# Patient Record
Sex: Female | Born: 1940 | Race: Black or African American | Hispanic: No | Smoking: Former smoker
Health system: Southern US, Community
[De-identification: ages and names within clinical notes are randomized; demographics above are authoritative.]

## PROBLEM LIST (undated history)

## (undated) DIAGNOSIS — I10 Essential (primary) hypertension: Secondary | ICD-10-CM

## (undated) DIAGNOSIS — M329 Systemic lupus erythematosus, unspecified: Secondary | ICD-10-CM

## (undated) DIAGNOSIS — H409 Unspecified glaucoma: Secondary | ICD-10-CM

## (undated) DIAGNOSIS — E7800 Pure hypercholesterolemia, unspecified: Secondary | ICD-10-CM

## (undated) DIAGNOSIS — E079 Disorder of thyroid, unspecified: Secondary | ICD-10-CM

## (undated) DIAGNOSIS — IMO0002 Reserved for concepts with insufficient information to code with codable children: Secondary | ICD-10-CM

## (undated) HISTORY — DX: Reserved for concepts with insufficient information to code with codable children: IMO0002

## (undated) HISTORY — PX: TUBAL LIGATION: SHX77

## (undated) HISTORY — DX: Systemic lupus erythematosus, unspecified: M32.9

## (undated) HISTORY — DX: Unspecified glaucoma: H40.9

## (undated) HISTORY — PX: CATARACT EXTRACTION, BILATERAL: SHX1313

## (undated) HISTORY — PX: TOTAL KNEE ARTHROPLASTY: SHX125

## (undated) HISTORY — PX: JOINT REPLACEMENT: SHX530

## (undated) HISTORY — PX: ABDOMINAL HYSTERECTOMY: SHX81

---

## 1999-03-05 ENCOUNTER — Emergency Department (HOSPITAL_COMMUNITY): Admission: EM | Admit: 1999-03-05 | Discharge: 1999-03-05 | Payer: Self-pay | Admitting: Emergency Medicine

## 1999-10-19 ENCOUNTER — Encounter: Payer: Self-pay | Admitting: Family Medicine

## 1999-10-19 ENCOUNTER — Encounter: Admission: RE | Admit: 1999-10-19 | Discharge: 1999-10-19 | Payer: Self-pay | Admitting: Family Medicine

## 2000-01-11 ENCOUNTER — Other Ambulatory Visit: Admission: RE | Admit: 2000-01-11 | Discharge: 2000-01-11 | Payer: Self-pay | Admitting: Family Medicine

## 2000-03-27 ENCOUNTER — Encounter (INDEPENDENT_AMBULATORY_CARE_PROVIDER_SITE_OTHER): Payer: Self-pay | Admitting: *Deleted

## 2000-03-27 ENCOUNTER — Ambulatory Visit (HOSPITAL_COMMUNITY): Admission: RE | Admit: 2000-03-27 | Discharge: 2000-03-27 | Payer: Self-pay | Admitting: *Deleted

## 2000-05-10 ENCOUNTER — Emergency Department (HOSPITAL_COMMUNITY): Admission: EM | Admit: 2000-05-10 | Discharge: 2000-05-11 | Payer: Self-pay | Admitting: Emergency Medicine

## 2000-05-11 ENCOUNTER — Encounter: Payer: Self-pay | Admitting: Emergency Medicine

## 2000-10-23 ENCOUNTER — Other Ambulatory Visit: Admission: RE | Admit: 2000-10-23 | Discharge: 2000-10-23 | Payer: Self-pay | Admitting: *Deleted

## 2000-12-20 ENCOUNTER — Ambulatory Visit (HOSPITAL_BASED_OUTPATIENT_CLINIC_OR_DEPARTMENT_OTHER): Admission: RE | Admit: 2000-12-20 | Discharge: 2000-12-20 | Payer: Self-pay | Admitting: *Deleted

## 2002-05-07 ENCOUNTER — Other Ambulatory Visit: Admission: RE | Admit: 2002-05-07 | Discharge: 2002-05-07 | Payer: Self-pay | Admitting: *Deleted

## 2003-05-27 ENCOUNTER — Ambulatory Visit (HOSPITAL_COMMUNITY): Admission: RE | Admit: 2003-05-27 | Discharge: 2003-05-27 | Payer: Self-pay | Admitting: *Deleted

## 2003-05-27 ENCOUNTER — Encounter (INDEPENDENT_AMBULATORY_CARE_PROVIDER_SITE_OTHER): Payer: Self-pay | Admitting: *Deleted

## 2003-06-16 ENCOUNTER — Other Ambulatory Visit: Admission: RE | Admit: 2003-06-16 | Discharge: 2003-06-16 | Payer: Self-pay | Admitting: *Deleted

## 2004-07-13 ENCOUNTER — Other Ambulatory Visit: Admission: RE | Admit: 2004-07-13 | Discharge: 2004-07-13 | Payer: Self-pay | Admitting: *Deleted

## 2005-02-27 ENCOUNTER — Encounter: Admission: RE | Admit: 2005-02-27 | Discharge: 2005-02-27 | Payer: Self-pay | Admitting: Internal Medicine

## 2005-03-22 ENCOUNTER — Ambulatory Visit (HOSPITAL_COMMUNITY): Admission: RE | Admit: 2005-03-22 | Discharge: 2005-03-22 | Payer: Self-pay | Admitting: Gastroenterology

## 2005-07-21 ENCOUNTER — Encounter: Admission: RE | Admit: 2005-07-21 | Discharge: 2005-07-21 | Payer: Self-pay | Admitting: Orthopedic Surgery

## 2005-08-08 ENCOUNTER — Encounter: Admission: RE | Admit: 2005-08-08 | Discharge: 2005-08-08 | Payer: Self-pay | Admitting: Orthopedic Surgery

## 2005-08-28 ENCOUNTER — Other Ambulatory Visit: Admission: RE | Admit: 2005-08-28 | Discharge: 2005-08-28 | Payer: Self-pay | Admitting: *Deleted

## 2006-01-29 ENCOUNTER — Ambulatory Visit (HOSPITAL_COMMUNITY): Admission: RE | Admit: 2006-01-29 | Discharge: 2006-01-29 | Payer: Self-pay | Admitting: Otolaryngology

## 2007-09-17 ENCOUNTER — Ambulatory Visit (HOSPITAL_COMMUNITY): Admission: RE | Admit: 2007-09-17 | Discharge: 2007-09-17 | Payer: Self-pay | Admitting: Internal Medicine

## 2007-11-18 ENCOUNTER — Other Ambulatory Visit: Admission: RE | Admit: 2007-11-18 | Discharge: 2007-11-18 | Payer: Self-pay | Admitting: Gynecology

## 2008-11-27 ENCOUNTER — Emergency Department (HOSPITAL_COMMUNITY): Admission: EM | Admit: 2008-11-27 | Discharge: 2008-11-27 | Payer: Self-pay | Admitting: Emergency Medicine

## 2009-10-04 ENCOUNTER — Encounter
Admission: RE | Admit: 2009-10-04 | Discharge: 2009-10-04 | Payer: Self-pay | Admitting: Physical Medicine and Rehabilitation

## 2010-06-23 ENCOUNTER — Encounter
Admission: RE | Admit: 2010-06-23 | Discharge: 2010-06-23 | Payer: Self-pay | Source: Home / Self Care | Attending: Orthopedic Surgery | Admitting: Orthopedic Surgery

## 2010-10-04 ENCOUNTER — Other Ambulatory Visit: Payer: Self-pay | Admitting: Internal Medicine

## 2010-10-04 ENCOUNTER — Other Ambulatory Visit: Payer: Self-pay

## 2010-10-04 ENCOUNTER — Ambulatory Visit
Admission: RE | Admit: 2010-10-04 | Discharge: 2010-10-04 | Disposition: A | Discharge status: Home / Self Care | Payer: Medicare Other | Source: Ambulatory Visit | Attending: Internal Medicine | Admitting: Internal Medicine

## 2010-10-04 DIAGNOSIS — R0789 Other chest pain: Secondary | ICD-10-CM

## 2010-10-04 MED ORDER — IOHEXOL 300 MG/ML  SOLN
100.0000 mL | Freq: Once | INTRAMUSCULAR | Status: AC | PRN
  Administered 2010-10-04: 100 mL via INTRAVENOUS

## 2010-10-21 NOTE — Op Note (Signed)
[place]. [facility]  Patient:    JEZABELLA, SCHRIEVER                      MRN: [id number] Adm. Date:  [id number] Attending:  Maryanna Shape                           Operative Report  PREOPERATIVE DIAGNOSES: 1. Small rotator cuff tear. 2. Impingement. 3. Tendinosis. 4. Early arthritis.  POSTOPERATIVE DIAGNOSES: 1. Grade 3 and 4 chondromalacia, humeral head and glenoid labrum. 2. Diffuse synovitis. 4. Multiple chondral loose bodies. 4. A 1 x 1 cm rotator cuff tear.  ANESTHESIA:  General.  SURGEON:  Reynolds Bowl, M.D.  ASSISTANT:  _____.  DESCRIPTION OF PROCEDURE:  Established anterior and posterior portals in the right shoulder after appropriate prep and general anesthetic and immediately saw there were multiple chondral fragments floating about the joint with a was a very ragged humeral head and grade 3 changes of the glenoid labrum and diffuse synovial proliferation.  Through the anterior operative portal, a rotary meniscotome was placed, and this was used to debride some of the inflamed synovium and remove the loose bodies.  We did as well as we could.  We think that we got them all but could not be certain, then using the Mitek Side Effect Bovie to curette the synovium.  The biceps tendon was intact.  There was some inflammatory change in the area of the subscapularis.  This was likewise bovied.  We then looked in the subchondral space and established a lateral portal.  We saw there had been previous acromioplasty.  There were some adhesions more medially, and these were cleared.  There was a good space.  There was a 1 x 1 cm rotator cuff tear that was best seen from this surface, and we used the Mitek Side Effect ablator to debride the edges of this tear.  Otherwise did not seem that the space was compromised.  Having done that, we spent some time in the glenohumeral joint bovieing any venous bleeders.  Instruments were then removed, and  the alpha pump was placed in the subacromial space.  The portals were approximated with 5-0 nylon.  The Alpha pump tube was held in place with a piece of Tegaderm and a bulky dressing applied.  Instructions to the patient will be to take Darvocet-N 100 for pain, begin using the hand and arm gradually as tolerated, remove Alpha pump at 36 hours, at four days may shower, clean puncture wounds before and after with alcohol.  Apply cold to shoulder.  Have usual diet.  Call me with concerns. DD:  12/20/00 TD:  12/20/00 Job: [id number]

## 2010-10-21 NOTE — Op Note (Signed)
NAMEJAE, BRUCK NO.:  [id number]   MEDICAL RECORD NO.:  [id number]          PATIENT TYPE:  AMB   LOCATION:  ENDO                         FACILITY:  [facility]   PHYSICIAN:  Danise Edge, M.D.   DATE OF BIRTH:  1940-12-26   DATE OF PROCEDURE:  03/22/2005  DATE OF DISCHARGE:                                 OPERATIVE REPORT   PROCEDURE:  Diagnostic esophagogastroduodenoscopy.   REFERRING PHYSICIAN:  Ladell Pier, M.D.   PROCEDURE INDICATIONS:  Ms. Barbara Patton is a 70 year old female born 12-07-1940. Barbara Patton has a globus type sensation in her throat which does not  interfere with her swallowing. She denies cough, hoarseness, dysphagia or  odynophagia. She has no throat clearing.   The patient underwent a barium tablet and liquid barium swallow which was  normal except for the presence of a small sliding hiatal hernia transient  delay of the passage of the barium tablet at the aortic arch.   ENDOSCOPIST:  Danise Edge, M.D.]   PREMEDICATION:  Versed 5 mg, Demerol 50 mg.   DESCRIPTION OF PROCEDURE:  After obtaining informed consent, Barbara Patton was  placed in the left lateral decubitus position. I administered intravenous  Demerol and intravenous Versed to achieve conscious sedation for the  procedure. The patient's blood pressure, oxygen saturation and cardiac  rhythm were monitored throughout the procedure and documented in the medical  record.   The Olympus gastroscope was passed through the posterior hypopharynx into  the proximal esophagus without difficulty. The hypopharynx, larynx and vocal  cords appeared completely normal. There was no evidence of a foreign body in  the hypopharynx.   ESOPHAGOSCOPY:  The proximal, mid and lower segments of the esophageal  mucosa appear completely normal. The squamocolumnar junction is noted at 39  cm from the incisor teeth. There is no endoscopic evidence for the presence  of Barrett's esophagus, erosive  esophagitis or esophageal stricture  formation.   GASTROSCOPY:  Retroflexed view of the gastric cardia and fundus was normal.  The gastric body, antrum and pylorus appeared normal.   DUODENOSCOPY:  The duodenal bulb and descending duodenum appeared normal.   ASSESSMENT:  Normal esophagogastroduodenoscopy. I cannot explain Barbara Patton's  foreign body sensation in her throat.           ______________________________  Danise Edge, M.D.     MJ/MEDQ  D:  03/22/2005  T:  03/22/2005  Job:  [id number]   cc:   Ladell Pier, M.D.  Fax: [phone]

## 2010-12-05 ENCOUNTER — Other Ambulatory Visit: Payer: Self-pay | Admitting: Orthopedic Surgery

## 2010-12-05 DIAGNOSIS — M25512 Pain in left shoulder: Secondary | ICD-10-CM

## 2010-12-05 DIAGNOSIS — R29898 Other symptoms and signs involving the musculoskeletal system: Secondary | ICD-10-CM

## 2010-12-08 ENCOUNTER — Ambulatory Visit
Admission: RE | Admit: 2010-12-08 | Discharge: 2010-12-08 | Disposition: A | Discharge status: Home / Self Care | Payer: Medicare Other | Source: Ambulatory Visit | Attending: Orthopedic Surgery | Admitting: Orthopedic Surgery

## 2010-12-08 DIAGNOSIS — R29898 Other symptoms and signs involving the musculoskeletal system: Secondary | ICD-10-CM

## 2010-12-08 DIAGNOSIS — M25512 Pain in left shoulder: Secondary | ICD-10-CM

## 2010-12-15 ENCOUNTER — Ambulatory Visit (HOSPITAL_COMMUNITY)
Admission: RE | Admit: 2010-12-15 | Discharge: 2010-12-15 | Disposition: A | Discharge status: Home / Self Care | Payer: Medicare Other | Source: Ambulatory Visit | Attending: Orthopedic Surgery | Admitting: Orthopedic Surgery

## 2010-12-15 ENCOUNTER — Encounter (HOSPITAL_COMMUNITY)
Admission: RE | Admit: 2010-12-15 | Discharge: 2010-12-15 | Disposition: A | Discharge status: Home / Self Care | Payer: Medicare Other | Source: Ambulatory Visit | Attending: Orthopedic Surgery | Admitting: Orthopedic Surgery

## 2010-12-15 ENCOUNTER — Other Ambulatory Visit (HOSPITAL_COMMUNITY): Payer: Self-pay | Admitting: Orthopedic Surgery

## 2010-12-15 DIAGNOSIS — M75100 Unspecified rotator cuff tear or rupture of unspecified shoulder, not specified as traumatic: Secondary | ICD-10-CM

## 2010-12-15 DIAGNOSIS — Z01811 Encounter for preprocedural respiratory examination: Secondary | ICD-10-CM | POA: Insufficient documentation

## 2010-12-15 DIAGNOSIS — I517 Cardiomegaly: Secondary | ICD-10-CM | POA: Insufficient documentation

## 2010-12-15 DIAGNOSIS — Z01812 Encounter for preprocedural laboratory examination: Secondary | ICD-10-CM | POA: Insufficient documentation

## 2010-12-15 LAB — CBC
Hemoglobin: 12.2 g/dL (ref 12.0–15.0)
MCHC: 33.5 g/dL (ref 30.0–36.0)
Platelets: 212 10*3/uL (ref 150–400)
RBC: 3.9 MIL/uL (ref 3.87–5.11)

## 2010-12-15 LAB — BASIC METABOLIC PANEL
BUN: 24 mg/dL — ABNORMAL HIGH (ref 6–23)
GFR calc Af Amer: >60 mL/min (ref >60)
Glucose, Bld: 87 mg/dL (ref 70–99)
Potassium: 3.6 mEq/L (ref 3.5–5.1)
Sodium: 140 mEq/L (ref 135–145)

## 2010-12-20 ENCOUNTER — Ambulatory Visit (HOSPITAL_COMMUNITY)
Admission: RE | Admit: 2010-12-20 | Discharge: 2010-12-22 | Disposition: A | Discharge status: Home / Self Care | Payer: Medicare Other | Source: Ambulatory Visit | Attending: Orthopedic Surgery | Admitting: Orthopedic Surgery

## 2010-12-20 DIAGNOSIS — M659 Unspecified synovitis and tenosynovitis, unspecified site: Secondary | ICD-10-CM | POA: Insufficient documentation

## 2010-12-20 DIAGNOSIS — I10 Essential (primary) hypertension: Secondary | ICD-10-CM | POA: Insufficient documentation

## 2010-12-20 DIAGNOSIS — Z5333 Arthroscopic surgical procedure converted to open procedure: Secondary | ICD-10-CM | POA: Insufficient documentation

## 2010-12-20 DIAGNOSIS — M66329 Spontaneous rupture of flexor tendons, unspecified upper arm: Secondary | ICD-10-CM | POA: Insufficient documentation

## 2010-12-20 DIAGNOSIS — M129 Arthropathy, unspecified: Secondary | ICD-10-CM | POA: Insufficient documentation

## 2010-12-20 DIAGNOSIS — M719 Bursopathy, unspecified: Secondary | ICD-10-CM | POA: Insufficient documentation

## 2010-12-20 DIAGNOSIS — M67919 Unspecified disorder of synovium and tendon, unspecified shoulder: Secondary | ICD-10-CM | POA: Insufficient documentation

## 2010-12-21 LAB — URINALYSIS, ROUTINE W REFLEX MICROSCOPIC
Bilirubin Urine: NEGATIVE
Glucose, UA: NEGATIVE mg/dL
Ketones, ur: NEGATIVE mg/dL
Leukocytes, UA: NEGATIVE
Nitrite: NEGATIVE
Protein, ur: NEGATIVE mg/dL
Urobilinogen, UA: 0.2 mg/dL (ref 0.0–1.0)

## 2010-12-23 LAB — URINE CULTURE

## 2011-01-05 NOTE — Op Note (Signed)
Barbara Patton, Barbara Patton NO.:  [id number]  MEDICAL RECORD NO.:  [id number]  LOCATION:  5041                         FACILITY:  [facility]  PHYSICIAN:  Burnard Bunting, M.D.    DATE OF BIRTH:  1940-08-09  DATE OF PROCEDURE:  12/20/2010 DATE OF DISCHARGE:                              OPERATIVE REPORT   PREOPERATIVE DIAGNOSES:  Left shoulder synovitis, biceps tendonitis, rotator cuff tear.  POSTOPERATIVE DIAGNOSES:  Left shoulder synovitis, biceps tendonitis, rotator cuff tear.  PROCEDURE:  Left shoulder diagnostic arthroscopy with extensive debridement of labrum including calcified superior labral tissue, biceps tendon release, rotator cuff and synovitis debridement, open biceps tenodesis, open partial rotator cuff repair.  SURGEON:  Burnard Bunting, MD  ASSISTANT:  Wende Neighbors, PA  ANESTHESIA:  General endotracheal.  ESTIMATED BLOOD LOSS:  25 mL.  DRAINS:  None.  INDICATIONS:  Barbara Patton is a 70 year old patient with longstanding left shoulder rotator cuff tear.  She presents now for operative management after explanation of risks and benefits.  OPERATIVE FINDINGS: 1. Examination under anesthesia, range of motion, external rotation to     50 degrees, abduction is 60, isolated glenohumeral forward flexion     is about 160, isolated glenohumeral abduction is about 90.  This is     on forward flexion and glenohumeral abduction is about 10-15     degrees less than the right hand side. 2. Diagnostic operative arthroscopy.     a.     Flattening and degeneration of the intra-articular portion      of the biceps with a full-thickness horizontal cleavage type tear      in the biceps tendon.     b.     Synovitis within the rotator interval.     c.     Synovitis and calcification of the superior labral tissue.     d.     Massive rotator cuff tear with retraction of the      supraspinatus to the glenoid rim and retraction of the      infraspinatus  posteriorly. 3. Bursitis and spurring  PROCEDURE IN DETAIL:  The patient was brought to the operating room where general endotracheal anesthesia was induced.  Perioperative antibiotics were administered.  Left shoulder, arm, and hand were prescribed with alcohol and Betadine, which allowed to air dry then prepped with DuraPrep solution and draped in sterile manner.  Barbara Patton was used cover the axilla.  The time-out was called.  Posterior portal was created 2 cm medial and inferior to the posterolateral margin of the acromion.  Anterior portal was created under direct visualization. Diagnostic arthroscopy was performed.  Massive rotator cuff tear was identified.  Supraspinatus was unrepairable.  The infraspinatus had retracted posteriorly, but some partial humeral head coverage to restore the anterior-posterior force couple could be achieved.  Synovitis was debrided.  Calcification of the superior glenoid labral tissue was resected with a bur.  Debridement was also performed on the glenohumeral articular surface, particularly the humeral head where a loose chondral flaps were present, an area about the size of a dime of arthritis was present.  Anterior-inferior and posterior-inferior glenohumeral ligaments were intact.  Following extensive  debridement of synovitis, superior labral spurring, rotator cuff tendon and humeral head loose chondral flaps, the instruments were removed from the anterior and posterior portals, which were then closed using 3-0 nylon.  Barbara Patton was then used cover the shoulder after re-prepping with DuraPrep.  The incision was then made off the anterolateral margin acromion.  Skin and subcutaneous tissue were sharply divided.  Deltoid was split and measured distance of 4 cm from the anterolateral margin of the acromion between the middle and anterior raphes.  Stay suture of 0 Vicryl suture was placed.  Biceps tenodesis was then performed incising the transverse humeral  ligament on the medial aspect.  Tendon was tenodesed after securing with fiber loop with 5 throws and securing it into the intertubercular groove with 7 mm x 30 mm bioabsorbable interference screw from [employer].  The rotator cuff tendon was then mobilized. Humeral head coverage was not possible due to retraction, however, anterior-posterior force couple could be achieved with a partial repair of the infraspinatus, which was mobilized anteriorly.  This was secured using two corkscrew anchors and two PushLock.  At this time, the subacromial decompression with bursectomy and acromioplasty was performed.  Great care was taken to maintain the coracoacromial ligament attachment.  At this time, the incision was thoroughly irrigated. Instruments were removed.  The deltoid split was closed using 0 Vicryl suture followed by interrupted inverted 2-0 Vicryl suture and running 3- 0 pullout Prolene.  The patient tolerated the procedure well without immediate complication.  Barbara Patton's assistance was required during the case for retraction opening, closing, limb positioning, mobilization of tissue.  Her assistance was a medical necessity.     Burnard Bunting, M.D.     GSD/MEDQ  D:  12/20/2010  T:  12/20/2010  Job:  [id number]  Electronically Signed by [name].  Barbara Patton M.D. on 01/05/2011 08:23:15 AM

## 2011-02-28 LAB — CROSSMATCH
ABO/RH(D): O POS
Antibody Screen: NEGATIVE

## 2011-02-28 LAB — ABO/RH: ABO/RH(D): O POS

## 2011-03-09 ENCOUNTER — Other Ambulatory Visit (HOSPITAL_COMMUNITY): Payer: Self-pay | Admitting: Orthopedic Surgery

## 2011-03-09 DIAGNOSIS — M25562 Pain in left knee: Secondary | ICD-10-CM

## 2011-03-15 ENCOUNTER — Ambulatory Visit (HOSPITAL_COMMUNITY): Payer: Medicare Other

## 2011-03-15 ENCOUNTER — Encounter (HOSPITAL_COMMUNITY)
Admission: RE | Admit: 2011-03-15 | Discharge: 2011-03-15 | Disposition: A | Discharge status: Home / Self Care | Payer: Medicare Other | Source: Ambulatory Visit | Attending: Orthopedic Surgery | Admitting: Orthopedic Surgery

## 2011-03-15 DIAGNOSIS — M25569 Pain in unspecified knee: Secondary | ICD-10-CM | POA: Insufficient documentation

## 2011-03-15 DIAGNOSIS — M25562 Pain in left knee: Secondary | ICD-10-CM

## 2011-03-15 MED ORDER — TECHNETIUM TC 99M MEDRONATE IV KIT
25.0000 | PACK | Freq: Once | INTRAVENOUS | Status: AC | PRN
  Administered 2011-03-15: 27 via INTRAVENOUS

## 2011-03-16 DIAGNOSIS — T8484XA Pain due to internal orthopedic prosthetic devices, implants and grafts, initial encounter: Secondary | ICD-10-CM | POA: Insufficient documentation

## 2011-03-16 DIAGNOSIS — Z96659 Presence of unspecified artificial knee joint: Secondary | ICD-10-CM | POA: Insufficient documentation

## 2011-03-20 ENCOUNTER — Encounter (HOSPITAL_COMMUNITY): Payer: Medicare Other

## 2011-03-20 ENCOUNTER — Other Ambulatory Visit (HOSPITAL_COMMUNITY): Payer: Medicare Other

## 2011-06-20 DIAGNOSIS — M19049 Primary osteoarthritis, unspecified hand: Secondary | ICD-10-CM | POA: Diagnosis not present

## 2011-07-05 DIAGNOSIS — Z0000 Encounter for general adult medical examination without abnormal findings: Secondary | ICD-10-CM | POA: Diagnosis not present

## 2011-07-05 DIAGNOSIS — J069 Acute upper respiratory infection, unspecified: Secondary | ICD-10-CM | POA: Diagnosis not present

## 2011-07-05 DIAGNOSIS — E782 Mixed hyperlipidemia: Secondary | ICD-10-CM | POA: Diagnosis not present

## 2011-07-05 DIAGNOSIS — E039 Hypothyroidism, unspecified: Secondary | ICD-10-CM | POA: Diagnosis not present

## 2011-07-05 DIAGNOSIS — I10 Essential (primary) hypertension: Secondary | ICD-10-CM | POA: Diagnosis not present

## 2011-07-18 DIAGNOSIS — M19049 Primary osteoarthritis, unspecified hand: Secondary | ICD-10-CM | POA: Diagnosis not present

## 2011-07-18 DIAGNOSIS — G5600 Carpal tunnel syndrome, unspecified upper limb: Secondary | ICD-10-CM | POA: Diagnosis not present

## 2011-07-26 DIAGNOSIS — E039 Hypothyroidism, unspecified: Secondary | ICD-10-CM | POA: Diagnosis not present

## 2011-07-26 DIAGNOSIS — N951 Menopausal and female climacteric states: Secondary | ICD-10-CM | POA: Diagnosis not present

## 2011-07-26 DIAGNOSIS — B373 Candidiasis of vulva and vagina: Secondary | ICD-10-CM | POA: Diagnosis not present

## 2011-07-26 DIAGNOSIS — Z124 Encounter for screening for malignant neoplasm of cervix: Secondary | ICD-10-CM | POA: Diagnosis not present

## 2011-07-26 DIAGNOSIS — Z1212 Encounter for screening for malignant neoplasm of rectum: Secondary | ICD-10-CM | POA: Diagnosis not present

## 2011-07-27 DIAGNOSIS — M79609 Pain in unspecified limb: Secondary | ICD-10-CM | POA: Diagnosis not present

## 2011-07-27 DIAGNOSIS — M25579 Pain in unspecified ankle and joints of unspecified foot: Secondary | ICD-10-CM | POA: Diagnosis not present

## 2011-08-02 DIAGNOSIS — Z09 Encounter for follow-up examination after completed treatment for conditions other than malignant neoplasm: Secondary | ICD-10-CM | POA: Diagnosis not present

## 2011-08-02 DIAGNOSIS — N63 Unspecified lump in unspecified breast: Secondary | ICD-10-CM | POA: Diagnosis not present

## 2011-08-03 DIAGNOSIS — M25579 Pain in unspecified ankle and joints of unspecified foot: Secondary | ICD-10-CM | POA: Diagnosis not present

## 2011-08-04 DIAGNOSIS — M25579 Pain in unspecified ankle and joints of unspecified foot: Secondary | ICD-10-CM | POA: Diagnosis not present

## 2011-08-08 DIAGNOSIS — M25579 Pain in unspecified ankle and joints of unspecified foot: Secondary | ICD-10-CM | POA: Diagnosis not present

## 2011-08-09 DIAGNOSIS — M25579 Pain in unspecified ankle and joints of unspecified foot: Secondary | ICD-10-CM | POA: Diagnosis not present

## 2011-08-11 DIAGNOSIS — M25579 Pain in unspecified ankle and joints of unspecified foot: Secondary | ICD-10-CM | POA: Diagnosis not present

## 2011-08-15 DIAGNOSIS — M25579 Pain in unspecified ankle and joints of unspecified foot: Secondary | ICD-10-CM | POA: Diagnosis not present

## 2011-08-30 DIAGNOSIS — M25519 Pain in unspecified shoulder: Secondary | ICD-10-CM | POA: Diagnosis not present

## 2011-09-05 DIAGNOSIS — Z96659 Presence of unspecified artificial knee joint: Secondary | ICD-10-CM | POA: Diagnosis not present

## 2011-09-05 DIAGNOSIS — Z9660 Presence of unspecified orthopedic joint implant: Secondary | ICD-10-CM | POA: Diagnosis not present

## 2011-09-05 DIAGNOSIS — T84039A Mechanical loosening of unspecified internal prosthetic joint, initial encounter: Secondary | ICD-10-CM | POA: Diagnosis not present

## 2011-09-05 DIAGNOSIS — M25469 Effusion, unspecified knee: Secondary | ICD-10-CM | POA: Diagnosis not present

## 2011-09-05 DIAGNOSIS — Z471 Aftercare following joint replacement surgery: Secondary | ICD-10-CM | POA: Diagnosis not present

## 2011-09-21 DIAGNOSIS — M6740 Ganglion, unspecified site: Secondary | ICD-10-CM | POA: Diagnosis not present

## 2011-09-29 DIAGNOSIS — H2510 Age-related nuclear cataract, unspecified eye: Secondary | ICD-10-CM | POA: Diagnosis not present

## 2011-09-29 DIAGNOSIS — H4011X0 Primary open-angle glaucoma, stage unspecified: Secondary | ICD-10-CM | POA: Diagnosis not present

## 2011-09-30 DIAGNOSIS — J00 Acute nasopharyngitis [common cold]: Secondary | ICD-10-CM | POA: Diagnosis not present

## 2011-09-30 DIAGNOSIS — J309 Allergic rhinitis, unspecified: Secondary | ICD-10-CM | POA: Diagnosis not present

## 2011-10-03 DIAGNOSIS — J329 Chronic sinusitis, unspecified: Secondary | ICD-10-CM | POA: Diagnosis not present

## 2011-10-04 DIAGNOSIS — T8489XA Other specified complication of internal orthopedic prosthetic devices, implants and grafts, initial encounter: Secondary | ICD-10-CM | POA: Diagnosis not present

## 2011-10-04 DIAGNOSIS — Z96659 Presence of unspecified artificial knee joint: Secondary | ICD-10-CM | POA: Diagnosis not present

## 2011-11-02 DIAGNOSIS — R791 Abnormal coagulation profile: Secondary | ICD-10-CM | POA: Diagnosis not present

## 2011-11-02 DIAGNOSIS — T8489XA Other specified complication of internal orthopedic prosthetic devices, implants and grafts, initial encounter: Secondary | ICD-10-CM | POA: Diagnosis not present

## 2011-11-02 DIAGNOSIS — E039 Hypothyroidism, unspecified: Secondary | ICD-10-CM | POA: Insufficient documentation

## 2011-11-02 DIAGNOSIS — IMO0002 Reserved for concepts with insufficient information to code with codable children: Secondary | ICD-10-CM | POA: Diagnosis not present

## 2011-11-02 DIAGNOSIS — Z880 Allergy status to penicillin: Secondary | ICD-10-CM | POA: Diagnosis not present

## 2011-11-02 DIAGNOSIS — E669 Obesity, unspecified: Secondary | ICD-10-CM | POA: Diagnosis present

## 2011-11-02 DIAGNOSIS — M169 Osteoarthritis of hip, unspecified: Secondary | ICD-10-CM | POA: Diagnosis not present

## 2011-11-02 DIAGNOSIS — I10 Essential (primary) hypertension: Secondary | ICD-10-CM | POA: Diagnosis not present

## 2011-11-02 DIAGNOSIS — E785 Hyperlipidemia, unspecified: Secondary | ICD-10-CM | POA: Insufficient documentation

## 2011-11-02 DIAGNOSIS — Z01818 Encounter for other preprocedural examination: Secondary | ICD-10-CM | POA: Diagnosis not present

## 2011-11-02 DIAGNOSIS — T84039A Mechanical loosening of unspecified internal prosthetic joint, initial encounter: Secondary | ICD-10-CM | POA: Diagnosis not present

## 2011-11-02 DIAGNOSIS — Z87891 Personal history of nicotine dependence: Secondary | ICD-10-CM | POA: Diagnosis not present

## 2011-11-02 DIAGNOSIS — Z01811 Encounter for preprocedural respiratory examination: Secondary | ICD-10-CM | POA: Diagnosis not present

## 2011-11-02 DIAGNOSIS — M1990 Unspecified osteoarthritis, unspecified site: Secondary | ICD-10-CM | POA: Insufficient documentation

## 2011-11-02 DIAGNOSIS — Z96659 Presence of unspecified artificial knee joint: Secondary | ICD-10-CM | POA: Diagnosis not present

## 2011-11-02 DIAGNOSIS — M6281 Muscle weakness (generalized): Secondary | ICD-10-CM | POA: Diagnosis not present

## 2011-11-02 DIAGNOSIS — Z01810 Encounter for preprocedural cardiovascular examination: Secondary | ICD-10-CM | POA: Diagnosis not present

## 2011-11-02 DIAGNOSIS — Z6831 Body mass index (BMI) 31.0-31.9, adult: Secondary | ICD-10-CM | POA: Diagnosis not present

## 2011-11-02 DIAGNOSIS — Z471 Aftercare following joint replacement surgery: Secondary | ICD-10-CM | POA: Diagnosis not present

## 2011-11-02 DIAGNOSIS — Z90710 Acquired absence of both cervix and uterus: Secondary | ICD-10-CM | POA: Diagnosis not present

## 2011-11-02 DIAGNOSIS — H409 Unspecified glaucoma: Secondary | ICD-10-CM | POA: Diagnosis present

## 2011-11-02 DIAGNOSIS — D62 Acute posthemorrhagic anemia: Secondary | ICD-10-CM | POA: Diagnosis not present

## 2011-11-02 DIAGNOSIS — G8918 Other acute postprocedural pain: Secondary | ICD-10-CM | POA: Diagnosis not present

## 2011-11-02 DIAGNOSIS — Z885 Allergy status to narcotic agent status: Secondary | ICD-10-CM | POA: Diagnosis not present

## 2011-11-02 DIAGNOSIS — Z79899 Other long term (current) drug therapy: Secondary | ICD-10-CM | POA: Diagnosis not present

## 2011-11-23 DIAGNOSIS — Z4789 Encounter for other orthopedic aftercare: Secondary | ICD-10-CM | POA: Diagnosis not present

## 2011-11-23 DIAGNOSIS — E039 Hypothyroidism, unspecified: Secondary | ICD-10-CM | POA: Diagnosis not present

## 2011-11-23 DIAGNOSIS — I10 Essential (primary) hypertension: Secondary | ICD-10-CM | POA: Diagnosis not present

## 2011-11-23 DIAGNOSIS — M7989 Other specified soft tissue disorders: Secondary | ICD-10-CM | POA: Diagnosis not present

## 2011-11-23 DIAGNOSIS — Z96659 Presence of unspecified artificial knee joint: Secondary | ICD-10-CM | POA: Diagnosis not present

## 2011-11-23 DIAGNOSIS — Z471 Aftercare following joint replacement surgery: Secondary | ICD-10-CM | POA: Diagnosis not present

## 2011-11-23 DIAGNOSIS — E785 Hyperlipidemia, unspecified: Secondary | ICD-10-CM | POA: Diagnosis not present

## 2011-11-23 DIAGNOSIS — M6281 Muscle weakness (generalized): Secondary | ICD-10-CM | POA: Diagnosis not present

## 2011-12-06 DIAGNOSIS — M7989 Other specified soft tissue disorders: Secondary | ICD-10-CM | POA: Insufficient documentation

## 2011-12-14 DIAGNOSIS — M542 Cervicalgia: Secondary | ICD-10-CM | POA: Diagnosis not present

## 2011-12-14 DIAGNOSIS — H9209 Otalgia, unspecified ear: Secondary | ICD-10-CM | POA: Diagnosis not present

## 2011-12-14 DIAGNOSIS — R51 Headache: Secondary | ICD-10-CM | POA: Diagnosis not present

## 2011-12-14 DIAGNOSIS — M1710 Unilateral primary osteoarthritis, unspecified knee: Secondary | ICD-10-CM | POA: Diagnosis not present

## 2011-12-19 DIAGNOSIS — M1710 Unilateral primary osteoarthritis, unspecified knee: Secondary | ICD-10-CM | POA: Diagnosis not present

## 2011-12-21 DIAGNOSIS — M1710 Unilateral primary osteoarthritis, unspecified knee: Secondary | ICD-10-CM | POA: Diagnosis not present

## 2011-12-26 DIAGNOSIS — M1710 Unilateral primary osteoarthritis, unspecified knee: Secondary | ICD-10-CM | POA: Diagnosis not present

## 2011-12-27 DIAGNOSIS — E782 Mixed hyperlipidemia: Secondary | ICD-10-CM | POA: Diagnosis not present

## 2011-12-27 DIAGNOSIS — E039 Hypothyroidism, unspecified: Secondary | ICD-10-CM | POA: Diagnosis not present

## 2011-12-27 DIAGNOSIS — I10 Essential (primary) hypertension: Secondary | ICD-10-CM | POA: Diagnosis not present

## 2011-12-28 DIAGNOSIS — M1710 Unilateral primary osteoarthritis, unspecified knee: Secondary | ICD-10-CM | POA: Diagnosis not present

## 2012-01-02 DIAGNOSIS — M1710 Unilateral primary osteoarthritis, unspecified knee: Secondary | ICD-10-CM | POA: Diagnosis not present

## 2012-01-04 DIAGNOSIS — M1710 Unilateral primary osteoarthritis, unspecified knee: Secondary | ICD-10-CM | POA: Diagnosis not present

## 2012-01-09 DIAGNOSIS — M1710 Unilateral primary osteoarthritis, unspecified knee: Secondary | ICD-10-CM | POA: Diagnosis not present

## 2012-01-11 DIAGNOSIS — M1710 Unilateral primary osteoarthritis, unspecified knee: Secondary | ICD-10-CM | POA: Diagnosis not present

## 2012-01-16 DIAGNOSIS — M1710 Unilateral primary osteoarthritis, unspecified knee: Secondary | ICD-10-CM | POA: Diagnosis not present

## 2012-01-19 DIAGNOSIS — M1710 Unilateral primary osteoarthritis, unspecified knee: Secondary | ICD-10-CM | POA: Diagnosis not present

## 2012-01-23 DIAGNOSIS — M1710 Unilateral primary osteoarthritis, unspecified knee: Secondary | ICD-10-CM | POA: Diagnosis not present

## 2012-01-26 DIAGNOSIS — M1710 Unilateral primary osteoarthritis, unspecified knee: Secondary | ICD-10-CM | POA: Diagnosis not present

## 2012-01-29 DIAGNOSIS — H4011X0 Primary open-angle glaucoma, stage unspecified: Secondary | ICD-10-CM | POA: Diagnosis not present

## 2012-01-29 DIAGNOSIS — H2510 Age-related nuclear cataract, unspecified eye: Secondary | ICD-10-CM | POA: Diagnosis not present

## 2012-01-29 DIAGNOSIS — H409 Unspecified glaucoma: Secondary | ICD-10-CM | POA: Diagnosis not present

## 2012-01-30 DIAGNOSIS — M1710 Unilateral primary osteoarthritis, unspecified knee: Secondary | ICD-10-CM | POA: Diagnosis not present

## 2012-01-31 DIAGNOSIS — Z09 Encounter for follow-up examination after completed treatment for conditions other than malignant neoplasm: Secondary | ICD-10-CM | POA: Diagnosis not present

## 2012-01-31 DIAGNOSIS — R928 Other abnormal and inconclusive findings on diagnostic imaging of breast: Secondary | ICD-10-CM | POA: Diagnosis not present

## 2012-02-01 DIAGNOSIS — M1710 Unilateral primary osteoarthritis, unspecified knee: Secondary | ICD-10-CM | POA: Diagnosis not present

## 2012-02-07 DIAGNOSIS — M1710 Unilateral primary osteoarthritis, unspecified knee: Secondary | ICD-10-CM | POA: Diagnosis not present

## 2012-02-16 DIAGNOSIS — M1710 Unilateral primary osteoarthritis, unspecified knee: Secondary | ICD-10-CM | POA: Diagnosis not present

## 2012-03-14 DIAGNOSIS — M719 Bursopathy, unspecified: Secondary | ICD-10-CM | POA: Diagnosis not present

## 2012-03-14 DIAGNOSIS — M67919 Unspecified disorder of synovium and tendon, unspecified shoulder: Secondary | ICD-10-CM | POA: Diagnosis not present

## 2012-04-05 DIAGNOSIS — M545 Low back pain: Secondary | ICD-10-CM | POA: Diagnosis not present

## 2012-04-16 DIAGNOSIS — M545 Low back pain: Secondary | ICD-10-CM | POA: Diagnosis not present

## 2012-04-25 DIAGNOSIS — M25569 Pain in unspecified knee: Secondary | ICD-10-CM | POA: Diagnosis not present

## 2012-04-25 DIAGNOSIS — M25519 Pain in unspecified shoulder: Secondary | ICD-10-CM | POA: Diagnosis not present

## 2012-04-25 DIAGNOSIS — M719 Bursopathy, unspecified: Secondary | ICD-10-CM | POA: Diagnosis not present

## 2012-05-10 DIAGNOSIS — M545 Low back pain: Secondary | ICD-10-CM | POA: Diagnosis not present

## 2012-05-21 DIAGNOSIS — M1710 Unilateral primary osteoarthritis, unspecified knee: Secondary | ICD-10-CM | POA: Diagnosis not present

## 2012-05-31 DIAGNOSIS — M545 Low back pain: Secondary | ICD-10-CM | POA: Diagnosis not present

## 2012-05-31 DIAGNOSIS — M76899 Other specified enthesopathies of unspecified lower limb, excluding foot: Secondary | ICD-10-CM | POA: Diagnosis not present

## 2012-06-02 DIAGNOSIS — Z23 Encounter for immunization: Secondary | ICD-10-CM | POA: Diagnosis not present

## 2012-06-13 DIAGNOSIS — M1710 Unilateral primary osteoarthritis, unspecified knee: Secondary | ICD-10-CM | POA: Diagnosis not present

## 2012-06-13 DIAGNOSIS — M25579 Pain in unspecified ankle and joints of unspecified foot: Secondary | ICD-10-CM | POA: Diagnosis not present

## 2012-06-20 DIAGNOSIS — M25579 Pain in unspecified ankle and joints of unspecified foot: Secondary | ICD-10-CM | POA: Diagnosis not present

## 2012-07-01 DIAGNOSIS — M25579 Pain in unspecified ankle and joints of unspecified foot: Secondary | ICD-10-CM | POA: Diagnosis not present

## 2012-07-08 DIAGNOSIS — M25579 Pain in unspecified ankle and joints of unspecified foot: Secondary | ICD-10-CM | POA: Diagnosis not present

## 2012-07-09 DIAGNOSIS — Z0000 Encounter for general adult medical examination without abnormal findings: Secondary | ICD-10-CM | POA: Diagnosis not present

## 2012-07-09 DIAGNOSIS — E039 Hypothyroidism, unspecified: Secondary | ICD-10-CM | POA: Diagnosis not present

## 2012-07-09 DIAGNOSIS — I10 Essential (primary) hypertension: Secondary | ICD-10-CM | POA: Diagnosis not present

## 2012-07-09 DIAGNOSIS — E782 Mixed hyperlipidemia: Secondary | ICD-10-CM | POA: Diagnosis not present

## 2012-07-09 DIAGNOSIS — R32 Unspecified urinary incontinence: Secondary | ICD-10-CM | POA: Diagnosis not present

## 2012-07-10 DIAGNOSIS — M25579 Pain in unspecified ankle and joints of unspecified foot: Secondary | ICD-10-CM | POA: Diagnosis not present

## 2012-07-12 DIAGNOSIS — M25579 Pain in unspecified ankle and joints of unspecified foot: Secondary | ICD-10-CM | POA: Diagnosis not present

## 2012-07-16 DIAGNOSIS — M25579 Pain in unspecified ankle and joints of unspecified foot: Secondary | ICD-10-CM | POA: Diagnosis not present

## 2012-07-17 DIAGNOSIS — M25579 Pain in unspecified ankle and joints of unspecified foot: Secondary | ICD-10-CM | POA: Diagnosis not present

## 2012-07-22 DIAGNOSIS — R197 Diarrhea, unspecified: Secondary | ICD-10-CM | POA: Diagnosis not present

## 2012-07-30 DIAGNOSIS — H4011X0 Primary open-angle glaucoma, stage unspecified: Secondary | ICD-10-CM | POA: Diagnosis not present

## 2012-10-08 DIAGNOSIS — Z471 Aftercare following joint replacement surgery: Secondary | ICD-10-CM | POA: Diagnosis not present

## 2012-10-08 DIAGNOSIS — M25569 Pain in unspecified knee: Secondary | ICD-10-CM | POA: Diagnosis not present

## 2012-10-08 DIAGNOSIS — Z96659 Presence of unspecified artificial knee joint: Secondary | ICD-10-CM | POA: Diagnosis not present

## 2012-10-14 DIAGNOSIS — M1710 Unilateral primary osteoarthritis, unspecified knee: Secondary | ICD-10-CM | POA: Diagnosis not present

## 2012-10-30 DIAGNOSIS — M545 Low back pain: Secondary | ICD-10-CM | POA: Diagnosis not present

## 2012-11-08 DIAGNOSIS — M545 Low back pain: Secondary | ICD-10-CM | POA: Diagnosis not present

## 2012-11-11 DIAGNOSIS — M19079 Primary osteoarthritis, unspecified ankle and foot: Secondary | ICD-10-CM | POA: Diagnosis not present

## 2012-11-22 DIAGNOSIS — M19079 Primary osteoarthritis, unspecified ankle and foot: Secondary | ICD-10-CM | POA: Diagnosis not present

## 2012-11-26 DIAGNOSIS — M545 Low back pain: Secondary | ICD-10-CM | POA: Diagnosis not present

## 2012-11-27 DIAGNOSIS — H4011X0 Primary open-angle glaucoma, stage unspecified: Secondary | ICD-10-CM | POA: Diagnosis not present

## 2012-12-19 DIAGNOSIS — M25519 Pain in unspecified shoulder: Secondary | ICD-10-CM | POA: Diagnosis not present

## 2012-12-27 DIAGNOSIS — M19079 Primary osteoarthritis, unspecified ankle and foot: Secondary | ICD-10-CM | POA: Diagnosis not present

## 2013-01-02 DIAGNOSIS — M79609 Pain in unspecified limb: Secondary | ICD-10-CM | POA: Diagnosis not present

## 2013-01-07 DIAGNOSIS — H9209 Otalgia, unspecified ear: Secondary | ICD-10-CM | POA: Diagnosis not present

## 2013-01-07 DIAGNOSIS — E782 Mixed hyperlipidemia: Secondary | ICD-10-CM | POA: Diagnosis not present

## 2013-01-07 DIAGNOSIS — E039 Hypothyroidism, unspecified: Secondary | ICD-10-CM | POA: Diagnosis not present

## 2013-01-07 DIAGNOSIS — I10 Essential (primary) hypertension: Secondary | ICD-10-CM | POA: Diagnosis not present

## 2013-01-10 DIAGNOSIS — H4011X0 Primary open-angle glaucoma, stage unspecified: Secondary | ICD-10-CM | POA: Diagnosis not present

## 2013-01-10 DIAGNOSIS — H409 Unspecified glaucoma: Secondary | ICD-10-CM | POA: Diagnosis not present

## 2013-01-23 DIAGNOSIS — H2589 Other age-related cataract: Secondary | ICD-10-CM | POA: Diagnosis not present

## 2013-01-23 DIAGNOSIS — H2510 Age-related nuclear cataract, unspecified eye: Secondary | ICD-10-CM | POA: Diagnosis not present

## 2013-01-23 DIAGNOSIS — H25019 Cortical age-related cataract, unspecified eye: Secondary | ICD-10-CM | POA: Diagnosis not present

## 2013-02-05 DIAGNOSIS — Z1231 Encounter for screening mammogram for malignant neoplasm of breast: Secondary | ICD-10-CM | POA: Diagnosis not present

## 2013-02-06 DIAGNOSIS — R928 Other abnormal and inconclusive findings on diagnostic imaging of breast: Secondary | ICD-10-CM | POA: Diagnosis not present

## 2013-02-06 DIAGNOSIS — N63 Unspecified lump in unspecified breast: Secondary | ICD-10-CM | POA: Diagnosis not present

## 2013-02-13 DIAGNOSIS — H409 Unspecified glaucoma: Secondary | ICD-10-CM | POA: Diagnosis not present

## 2013-02-13 DIAGNOSIS — H4011X0 Primary open-angle glaucoma, stage unspecified: Secondary | ICD-10-CM | POA: Diagnosis not present

## 2013-03-14 DIAGNOSIS — Z23 Encounter for immunization: Secondary | ICD-10-CM | POA: Diagnosis not present

## 2013-04-04 DIAGNOSIS — R5381 Other malaise: Secondary | ICD-10-CM | POA: Diagnosis not present

## 2013-04-04 DIAGNOSIS — E039 Hypothyroidism, unspecified: Secondary | ICD-10-CM | POA: Diagnosis not present

## 2013-05-13 DIAGNOSIS — M25539 Pain in unspecified wrist: Secondary | ICD-10-CM | POA: Diagnosis not present

## 2013-05-23 DIAGNOSIS — H35359 Cystoid macular degeneration, unspecified eye: Secondary | ICD-10-CM | POA: Diagnosis not present

## 2013-05-27 DIAGNOSIS — H35359 Cystoid macular degeneration, unspecified eye: Secondary | ICD-10-CM | POA: Diagnosis not present

## 2013-06-10 DIAGNOSIS — M19049 Primary osteoarthritis, unspecified hand: Secondary | ICD-10-CM | POA: Diagnosis not present

## 2013-06-17 DIAGNOSIS — H35359 Cystoid macular degeneration, unspecified eye: Secondary | ICD-10-CM | POA: Diagnosis not present

## 2013-07-08 DIAGNOSIS — H35359 Cystoid macular degeneration, unspecified eye: Secondary | ICD-10-CM | POA: Diagnosis not present

## 2013-07-10 DIAGNOSIS — M19049 Primary osteoarthritis, unspecified hand: Secondary | ICD-10-CM | POA: Diagnosis not present

## 2013-07-15 DIAGNOSIS — H35359 Cystoid macular degeneration, unspecified eye: Secondary | ICD-10-CM | POA: Diagnosis not present

## 2013-07-16 DIAGNOSIS — I10 Essential (primary) hypertension: Secondary | ICD-10-CM | POA: Diagnosis not present

## 2013-07-16 DIAGNOSIS — E782 Mixed hyperlipidemia: Secondary | ICD-10-CM | POA: Diagnosis not present

## 2013-07-16 DIAGNOSIS — E039 Hypothyroidism, unspecified: Secondary | ICD-10-CM | POA: Diagnosis not present

## 2013-07-16 DIAGNOSIS — Z0000 Encounter for general adult medical examination without abnormal findings: Secondary | ICD-10-CM | POA: Diagnosis not present

## 2013-08-05 DIAGNOSIS — H35359 Cystoid macular degeneration, unspecified eye: Secondary | ICD-10-CM | POA: Diagnosis not present

## 2013-08-05 DIAGNOSIS — H35379 Puckering of macula, unspecified eye: Secondary | ICD-10-CM | POA: Diagnosis not present

## 2013-08-06 DIAGNOSIS — T889XXS Complication of surgical and medical care, unspecified, sequela: Secondary | ICD-10-CM | POA: Diagnosis not present

## 2013-08-06 DIAGNOSIS — Z471 Aftercare following joint replacement surgery: Secondary | ICD-10-CM | POA: Diagnosis not present

## 2013-08-06 DIAGNOSIS — M76899 Other specified enthesopathies of unspecified lower limb, excluding foot: Secondary | ICD-10-CM | POA: Diagnosis not present

## 2013-08-06 DIAGNOSIS — Z96659 Presence of unspecified artificial knee joint: Secondary | ICD-10-CM | POA: Diagnosis not present

## 2013-08-07 DIAGNOSIS — M7062 Trochanteric bursitis, left hip: Secondary | ICD-10-CM | POA: Insufficient documentation

## 2013-08-07 DIAGNOSIS — Z471 Aftercare following joint replacement surgery: Secondary | ICD-10-CM | POA: Diagnosis not present

## 2013-08-07 DIAGNOSIS — Z96659 Presence of unspecified artificial knee joint: Secondary | ICD-10-CM | POA: Diagnosis not present

## 2013-08-07 DIAGNOSIS — M76899 Other specified enthesopathies of unspecified lower limb, excluding foot: Secondary | ICD-10-CM | POA: Diagnosis not present

## 2013-08-12 DIAGNOSIS — Z86010 Personal history of colonic polyps: Secondary | ICD-10-CM | POA: Diagnosis not present

## 2013-08-12 DIAGNOSIS — Z09 Encounter for follow-up examination after completed treatment for conditions other than malignant neoplasm: Secondary | ICD-10-CM | POA: Diagnosis not present

## 2013-08-12 DIAGNOSIS — K648 Other hemorrhoids: Secondary | ICD-10-CM | POA: Diagnosis not present

## 2013-08-12 DIAGNOSIS — D128 Benign neoplasm of rectum: Secondary | ICD-10-CM | POA: Diagnosis not present

## 2013-08-14 DIAGNOSIS — H35359 Cystoid macular degeneration, unspecified eye: Secondary | ICD-10-CM | POA: Diagnosis not present

## 2013-09-04 DIAGNOSIS — H35379 Puckering of macula, unspecified eye: Secondary | ICD-10-CM | POA: Diagnosis not present

## 2013-09-04 DIAGNOSIS — H35359 Cystoid macular degeneration, unspecified eye: Secondary | ICD-10-CM | POA: Diagnosis not present

## 2013-09-23 DIAGNOSIS — H35359 Cystoid macular degeneration, unspecified eye: Secondary | ICD-10-CM | POA: Diagnosis not present

## 2013-10-01 DIAGNOSIS — Z96659 Presence of unspecified artificial knee joint: Secondary | ICD-10-CM | POA: Diagnosis not present

## 2013-10-01 DIAGNOSIS — Z471 Aftercare following joint replacement surgery: Secondary | ICD-10-CM | POA: Diagnosis not present

## 2013-10-16 DIAGNOSIS — M25519 Pain in unspecified shoulder: Secondary | ICD-10-CM | POA: Diagnosis not present

## 2013-10-16 DIAGNOSIS — M75120 Complete rotator cuff tear or rupture of unspecified shoulder, not specified as traumatic: Secondary | ICD-10-CM | POA: Diagnosis not present

## 2013-10-28 DIAGNOSIS — H3530 Unspecified macular degeneration: Secondary | ICD-10-CM | POA: Diagnosis not present

## 2013-11-04 DIAGNOSIS — H209 Unspecified iridocyclitis: Secondary | ICD-10-CM | POA: Diagnosis not present

## 2013-11-04 DIAGNOSIS — H35359 Cystoid macular degeneration, unspecified eye: Secondary | ICD-10-CM | POA: Diagnosis not present

## 2013-11-11 ENCOUNTER — Ambulatory Visit
Admission: RE | Admit: 2013-11-11 | Discharge: 2013-11-11 | Disposition: A | Discharge status: Home / Self Care | Payer: Medicare Other | Source: Ambulatory Visit | Attending: Physical Medicine and Rehabilitation | Admitting: Physical Medicine and Rehabilitation

## 2013-11-11 ENCOUNTER — Other Ambulatory Visit: Payer: Self-pay | Admitting: Physical Medicine and Rehabilitation

## 2013-11-11 DIAGNOSIS — M545 Low back pain, unspecified: Secondary | ICD-10-CM

## 2013-11-11 DIAGNOSIS — M48061 Spinal stenosis, lumbar region without neurogenic claudication: Secondary | ICD-10-CM

## 2013-11-11 DIAGNOSIS — G894 Chronic pain syndrome: Secondary | ICD-10-CM

## 2013-11-11 DIAGNOSIS — J069 Acute upper respiratory infection, unspecified: Secondary | ICD-10-CM | POA: Diagnosis not present

## 2013-11-11 DIAGNOSIS — M47817 Spondylosis without myelopathy or radiculopathy, lumbosacral region: Secondary | ICD-10-CM | POA: Diagnosis not present

## 2013-11-12 DIAGNOSIS — M545 Low back pain, unspecified: Secondary | ICD-10-CM | POA: Diagnosis not present

## 2013-11-12 DIAGNOSIS — M5137 Other intervertebral disc degeneration, lumbosacral region: Secondary | ICD-10-CM | POA: Diagnosis not present

## 2013-11-12 DIAGNOSIS — M79609 Pain in unspecified limb: Secondary | ICD-10-CM | POA: Diagnosis not present

## 2013-11-12 DIAGNOSIS — M48061 Spinal stenosis, lumbar region without neurogenic claudication: Secondary | ICD-10-CM | POA: Diagnosis not present

## 2013-11-12 DIAGNOSIS — G894 Chronic pain syndrome: Secondary | ICD-10-CM | POA: Diagnosis not present

## 2013-11-18 DIAGNOSIS — H35359 Cystoid macular degeneration, unspecified eye: Secondary | ICD-10-CM | POA: Diagnosis not present

## 2013-12-03 DIAGNOSIS — M25469 Effusion, unspecified knee: Secondary | ICD-10-CM | POA: Diagnosis not present

## 2013-12-03 DIAGNOSIS — IMO0002 Reserved for concepts with insufficient information to code with codable children: Secondary | ICD-10-CM | POA: Diagnosis not present

## 2013-12-03 DIAGNOSIS — T889XXS Complication of surgical and medical care, unspecified, sequela: Secondary | ICD-10-CM | POA: Diagnosis not present

## 2013-12-03 DIAGNOSIS — Z96659 Presence of unspecified artificial knee joint: Secondary | ICD-10-CM | POA: Diagnosis not present

## 2013-12-03 DIAGNOSIS — Z471 Aftercare following joint replacement surgery: Secondary | ICD-10-CM | POA: Diagnosis not present

## 2013-12-04 DIAGNOSIS — M7052 Other bursitis of knee, left knee: Secondary | ICD-10-CM | POA: Insufficient documentation

## 2014-01-14 DIAGNOSIS — I10 Essential (primary) hypertension: Secondary | ICD-10-CM | POA: Diagnosis not present

## 2014-01-14 DIAGNOSIS — L989 Disorder of the skin and subcutaneous tissue, unspecified: Secondary | ICD-10-CM | POA: Diagnosis not present

## 2014-01-14 DIAGNOSIS — E039 Hypothyroidism, unspecified: Secondary | ICD-10-CM | POA: Diagnosis not present

## 2014-01-14 DIAGNOSIS — E782 Mixed hyperlipidemia: Secondary | ICD-10-CM | POA: Diagnosis not present

## 2014-01-15 DIAGNOSIS — H35379 Puckering of macula, unspecified eye: Secondary | ICD-10-CM | POA: Diagnosis not present

## 2014-01-15 DIAGNOSIS — H35359 Cystoid macular degeneration, unspecified eye: Secondary | ICD-10-CM | POA: Diagnosis not present

## 2014-01-26 DIAGNOSIS — H20019 Primary iridocyclitis, unspecified eye: Secondary | ICD-10-CM | POA: Diagnosis not present

## 2014-01-27 ENCOUNTER — Emergency Department (HOSPITAL_COMMUNITY): Payer: Medicare Other

## 2014-01-27 ENCOUNTER — Emergency Department (HOSPITAL_COMMUNITY)
Admission: EM | Admit: 2014-01-27 | Discharge: 2014-01-27 | Disposition: A | Discharge status: Home / Self Care | Payer: Medicare Other | Attending: Emergency Medicine | Admitting: Emergency Medicine

## 2014-01-27 ENCOUNTER — Encounter (HOSPITAL_COMMUNITY): Payer: Self-pay | Admitting: Emergency Medicine

## 2014-01-27 DIAGNOSIS — R072 Precordial pain: Secondary | ICD-10-CM | POA: Diagnosis not present

## 2014-01-27 DIAGNOSIS — Z8639 Personal history of other endocrine, nutritional and metabolic disease: Secondary | ICD-10-CM | POA: Insufficient documentation

## 2014-01-27 DIAGNOSIS — R079 Chest pain, unspecified: Secondary | ICD-10-CM | POA: Diagnosis not present

## 2014-01-27 DIAGNOSIS — R946 Abnormal results of thyroid function studies: Secondary | ICD-10-CM | POA: Diagnosis not present

## 2014-01-27 DIAGNOSIS — Z880 Allergy status to penicillin: Secondary | ICD-10-CM | POA: Insufficient documentation

## 2014-01-27 DIAGNOSIS — H209 Unspecified iridocyclitis: Secondary | ICD-10-CM | POA: Diagnosis not present

## 2014-01-27 DIAGNOSIS — R109 Unspecified abdominal pain: Secondary | ICD-10-CM | POA: Insufficient documentation

## 2014-01-27 DIAGNOSIS — Z862 Personal history of diseases of the blood and blood-forming organs and certain disorders involving the immune mechanism: Secondary | ICD-10-CM | POA: Insufficient documentation

## 2014-01-27 DIAGNOSIS — I10 Essential (primary) hypertension: Secondary | ICD-10-CM | POA: Diagnosis not present

## 2014-01-27 DIAGNOSIS — H35359 Cystoid macular degeneration, unspecified eye: Secondary | ICD-10-CM | POA: Diagnosis not present

## 2014-01-27 HISTORY — DX: Pure hypercholesterolemia, unspecified: E78.00

## 2014-01-27 HISTORY — DX: Essential (primary) hypertension: I10

## 2014-01-27 HISTORY — DX: Disorder of thyroid, unspecified: E07.9

## 2014-01-27 LAB — CBC
HCT: 37.7 % (ref 36.0–46.0)
Hemoglobin: 12.2 g/dL (ref 12.0–15.0)
MCH: 31.5 pg (ref 26.0–34.0)
MCHC: 32.4 g/dL (ref 30.0–36.0)
MCV: 97.4 fL (ref 78.0–100.0)
PLATELETS: 197 10*3/uL (ref 150–400)
RBC: 3.87 MIL/uL (ref 3.87–5.11)
RDW: 13.6 % (ref 11.5–15.5)
WBC: 4 10*3/uL (ref 4.0–10.5)

## 2014-01-27 LAB — COMPREHENSIVE METABOLIC PANEL
ALBUMIN: 3.5 g/dL (ref 3.5–5.2)
ALK PHOS: 95 U/L (ref 39–117)
ALT: 37 U/L — ABNORMAL HIGH (ref 0–35)
ANION GAP: 9 (ref 5–15)
AST: 28 U/L (ref 0–37)
BUN: 15 mg/dL (ref 6–23)
CALCIUM: 9.2 mg/dL (ref 8.4–10.5)
CO2: 29 mEq/L (ref 19–32)
CREATININE: 0.73 mg/dL (ref 0.50–1.10)
Chloride: 102 mEq/L (ref 96–112)
GFR calc Af Amer: >90 mL/min (ref >90)
GFR calc non Af Amer: 82 mL/min — ABNORMAL LOW (ref >90)
GLUCOSE: 109 mg/dL — AB (ref 70–99)
Potassium: 3.8 mEq/L (ref 3.7–5.3)
Sodium: 140 mEq/L (ref 137–147)
TOTAL PROTEIN: 7.3 g/dL (ref 6.0–8.3)
Total Bilirubin: 0.5 mg/dL (ref 0.3–1.2)

## 2014-01-27 LAB — LIPASE, BLOOD: Lipase: 19 U/L (ref 11–59)

## 2014-01-27 LAB — I-STAT TROPONIN, ED
TROPONIN I, POC: 0 ng/mL (ref 0.00–0.08)
TROPONIN I, POC: 0.01 ng/mL (ref 0.00–0.08)

## 2014-01-27 NOTE — ED Notes (Signed)
Pt reports onset this am of mid chest discomfort and epigastric pain, took antacids which reduced the pain. Having sob with exertion. ekg done at triage, airway intact, spo2 94%.

## 2014-01-27 NOTE — Discharge Instructions (Signed)
Followup with cardiology. The contact information has been provided in this discharge summary.  Return to the emergency department if you develop a significant worsening of your symptoms, or if your symptoms change in nature.   Chest Pain (Nonspecific) It is often hard to give a specific diagnosis for the cause of chest pain. There is always a chance that your pain could be related to something serious, such as a heart attack or a blood clot in the lungs. You need to follow up with your health care provider for further evaluation. CAUSES   Heartburn.  Pneumonia or bronchitis.  Anxiety or stress.  Inflammation around your heart (pericarditis) or lung (pleuritis or pleurisy).  A blood clot in the lung.  A collapsed lung (pneumothorax). It can develop suddenly on its own (spontaneous pneumothorax) or from trauma to the chest.  Shingles infection (herpes zoster virus). The chest wall is composed of bones, muscles, and cartilage. Any of these can be the source of the pain.  The bones can be bruised by injury.  The muscles or cartilage can be strained by coughing or overwork.  The cartilage can be affected by inflammation and become sore (costochondritis). DIAGNOSIS  Lab tests or other studies may be needed to find the cause of your pain. Your health care provider may have you take a test called an ambulatory electrocardiogram (ECG). An ECG records your heartbeat patterns over a 24-hour period. You may also have other tests, such as:  Transthoracic echocardiogram (TTE). During echocardiography, sound waves are used to evaluate how blood flows through your heart.  Transesophageal echocardiogram (TEE).  Cardiac monitoring. This allows your health care provider to monitor your heart rate and rhythm in real time.  Holter monitor. This is a portable device that records your heartbeat and can help diagnose heart arrhythmias. It allows your health care provider to track your heart activity  for several days, if needed.  Stress tests by exercise or by giving medicine that makes the heart beat faster. TREATMENT   Treatment depends on what may be causing your chest pain. Treatment may include:  Acid blockers for heartburn.  Anti-inflammatory medicine.  Pain medicine for inflammatory conditions.  Antibiotics if an infection is present.  You may be advised to change lifestyle habits. This includes stopping smoking and avoiding alcohol, caffeine, and chocolate.  You may be advised to keep your head raised (elevated) when sleeping. This reduces the chance of acid going backward from your stomach into your esophagus. Most of the time, nonspecific chest pain will improve within 2-3 days with rest and mild pain medicine.  HOME CARE INSTRUCTIONS   If antibiotics were prescribed, take them as directed. Finish them even if you start to feel better.  For the next few days, avoid physical activities that bring on chest pain. Continue physical activities as directed.  Do not use any tobacco products, including cigarettes, chewing tobacco, or electronic cigarettes.  Avoid drinking alcohol.  Only take medicine as directed by your health care provider.  Follow your health care provider's suggestions for further testing if your chest pain does not go away.  Keep any follow-up appointments you made. If you do not go to an appointment, you could develop lasting (chronic) problems with pain. If there is any problem keeping an appointment, call to reschedule. SEEK MEDICAL CARE IF:   Your chest pain does not go away, even after treatment.  You have a rash with blisters on your chest.  You have a fever. SEEK IMMEDIATE  MEDICAL CARE IF:   You have increased chest pain or pain that spreads to your arm, neck, jaw, back, or abdomen.  You have shortness of breath.  You have an increasing cough, or you cough up blood.  You have severe back or abdominal pain.  You feel nauseous or  vomit.  You have severe weakness.  You faint.  You have chills. This is an emergency. Do not wait to see if the pain will go away. Get medical help at once. Call your local emergency services (911 in [place] Do not drive yourself to the hospital. MAKE SURE YOU:   Understand these instructions.  Will watch your condition.  Will get help right away if you are not doing well or get worse. Document Released: 03/01/2005 Document Revised: 05/27/2013 Document Reviewed: 12/26/2007 [facility] Patient Information 2015 [place], [place]. This information is not intended to replace advice given to you by your health care provider. Make sure you discuss any questions you have with your health care provider.

## 2014-01-27 NOTE — ED Notes (Signed)
Patient in NAD at time of d/c

## 2014-01-27 NOTE — ED Provider Notes (Signed)
CSN: [id number]     Arrival date & time 01/27/14  7741 History   First MD Initiated Contact with Patient 01/27/14 [phone]     Chief Complaint  Patient presents with  . Chest Pain     (Consider location/radiation/quality/duration/timing/severity/associated sxs/prior Treatment) HPI Comments: Patient is a 73 year old female with history of hypertension and high cholesterol. She presents with complaints of discomfort in her chest and epigastrium which woke her from sleep this morning. She denies any nausea, diaphoresis, shortness of breath, or radiation to the arm or jaw. She took antacids and her symptoms seemed to have resolved. She denies any prior cardiac history. She states that she is very active and exercises several days weekly without any chest discomfort or shortness of breath.  Her primary doctor is Dr. Delfina Redwood from [place].  Patient is a 73 y.o. female presenting with chest pain. The history is provided by the patient.  Chest Pain Pain location:  Substernal area and epigastric Pain quality: sharp   Pain radiates to:  Does not radiate Pain radiates to the back: no   Pain severity:  Moderate Onset quality:  Sudden Timing:  Constant Progression:  Resolved Chronicity:  New Context: not breathing   Relieved by:  Nothing Worsened by:  Nothing tried Ineffective treatments:  None tried Associated symptoms: abdominal pain   Associated symptoms: no nausea and no shortness of breath     Past Medical History  Diagnosis Date  . Hypertension   . Thyroid disease   . High cholesterol    Past Surgical History  Procedure Laterality Date  . Joint replacement     History reviewed. No pertinent family history. History  Substance Use Topics  . Smoking status: Not on file  . Smokeless tobacco: Not on file  . Alcohol Use: Yes     Comment: occ wine   OB History   Grav Para Term Preterm Abortions TAB SAB Ect Mult Living                 Review of Systems  Respiratory: Negative for  shortness of breath.   Cardiovascular: Positive for chest pain.  Gastrointestinal: Positive for abdominal pain. Negative for nausea.  All other systems reviewed and are negative.     Allergies  Penicillins  Home Medications   Prior to Admission medications   Not on File   BP 137/63  Pulse 69  Temp(Src) 98 F (36.7 C) (Oral)  Resp 18  Ht 5\' 6"  (1.676 m)  Wt 188 lb (85.276 kg)  BMI 30.36 kg/m2  SpO2 94% Physical Exam  Nursing note and vitals reviewed. Constitutional: She is oriented to person, place, and time. She appears well-developed and well-nourished. No distress.  HENT:  Head: Normocephalic and atraumatic.  Neck: Normal range of motion. Neck supple.  Cardiovascular: Normal rate and regular rhythm.  Exam reveals no gallop and no friction rub.   No murmur heard. Pulmonary/Chest: Effort normal and breath sounds normal. No respiratory distress. She has no wheezes.  Abdominal: Soft. Bowel sounds are normal. She exhibits no distension. There is tenderness.  There is mild tenderness to palpation in the epigastrium with no rebound and no guarding.  Musculoskeletal: Normal range of motion.  Neurological: She is alert and oriented to person, place, and time.  Skin: Skin is warm and dry. She is not diaphoretic.    ED Course  Procedures (including critical care time) Labs Review Labs Reviewed  CBC  BASIC METABOLIC PANEL  PRO B NATRIURETIC PEPTIDE  I-STAT  [place], ED    Imaging Review No results found.   EKG Interpretation   Date/Time:  Tuesday January 27 2014 09:03:26 EDT Ventricular Rate:  73 PR Interval:  176 QRS Duration: 82 QT Interval:  410 QTC Calculation: 451 R Axis:   -28 Text Interpretation:  Normal sinus rhythm Left axis deviation Cannot rule  out Anterior infarct , age undetermined Abnormal ECG Confirmed by DELOS   MD, Rishaan Gunner ([id number]) on 01/27/2014 9:22:38 AM      MDM   Final diagnoses:  None    Patient is a 73 year old female who  presents with complaints of chest discomfort that woke her from sleep this morning. Her symptoms are somewhat atypical and were resolved with antacids. Workup today reveals an unremarkable EKG and negative troponin x2. She exercises several times weekly and has no exertional symptoms. I doubt a cardiac etiology, however do to the presence of risk factors and age, I feel as though followup with cardiology as an outpatient is appropriate. She will be given the number for cardiology to followup to discuss a stress test. She understands to return if her symptoms substantially worsen in the meantime.    Veryl Speak, MD 01/27/14 [phone]

## 2014-01-29 DIAGNOSIS — A539 Syphilis, unspecified: Secondary | ICD-10-CM | POA: Diagnosis not present

## 2014-01-29 DIAGNOSIS — H5710 Ocular pain, unspecified eye: Secondary | ICD-10-CM | POA: Diagnosis not present

## 2014-01-29 DIAGNOSIS — R079 Chest pain, unspecified: Secondary | ICD-10-CM | POA: Diagnosis not present

## 2014-01-29 DIAGNOSIS — K219 Gastro-esophageal reflux disease without esophagitis: Secondary | ICD-10-CM | POA: Diagnosis not present

## 2014-02-02 DIAGNOSIS — E039 Hypothyroidism, unspecified: Secondary | ICD-10-CM | POA: Diagnosis not present

## 2014-02-02 DIAGNOSIS — Z111 Encounter for screening for respiratory tuberculosis: Secondary | ICD-10-CM | POA: Diagnosis not present

## 2014-02-10 DIAGNOSIS — H209 Unspecified iridocyclitis: Secondary | ICD-10-CM | POA: Diagnosis not present

## 2014-02-10 DIAGNOSIS — Z1231 Encounter for screening mammogram for malignant neoplasm of breast: Secondary | ICD-10-CM | POA: Diagnosis not present

## 2014-02-10 DIAGNOSIS — H35359 Cystoid macular degeneration, unspecified eye: Secondary | ICD-10-CM | POA: Diagnosis not present

## 2014-02-13 DIAGNOSIS — L509 Urticaria, unspecified: Secondary | ICD-10-CM | POA: Diagnosis not present

## 2014-02-25 ENCOUNTER — Encounter: Payer: Medicare Other | Admitting: Cardiology

## 2014-02-26 DIAGNOSIS — E039 Hypothyroidism, unspecified: Secondary | ICD-10-CM | POA: Diagnosis not present

## 2014-02-27 DIAGNOSIS — R21 Rash and other nonspecific skin eruption: Secondary | ICD-10-CM | POA: Diagnosis not present

## 2014-03-05 DIAGNOSIS — E785 Hyperlipidemia, unspecified: Secondary | ICD-10-CM | POA: Diagnosis not present

## 2014-03-05 DIAGNOSIS — H409 Unspecified glaucoma: Secondary | ICD-10-CM | POA: Diagnosis not present

## 2014-03-05 DIAGNOSIS — M7989 Other specified soft tissue disorders: Secondary | ICD-10-CM | POA: Diagnosis not present

## 2014-03-05 DIAGNOSIS — H35351 Cystoid macular degeneration, right eye: Secondary | ICD-10-CM | POA: Diagnosis not present

## 2014-03-11 DIAGNOSIS — R21 Rash and other nonspecific skin eruption: Secondary | ICD-10-CM | POA: Diagnosis not present

## 2014-03-13 DIAGNOSIS — L989 Disorder of the skin and subcutaneous tissue, unspecified: Secondary | ICD-10-CM | POA: Diagnosis not present

## 2014-03-23 DIAGNOSIS — S90569A Insect bite (nonvenomous), unspecified ankle, initial encounter: Secondary | ICD-10-CM | POA: Diagnosis not present

## 2014-04-02 DIAGNOSIS — E039 Hypothyroidism, unspecified: Secondary | ICD-10-CM | POA: Diagnosis not present

## 2014-04-10 DIAGNOSIS — H35351 Cystoid macular degeneration, right eye: Secondary | ICD-10-CM | POA: Diagnosis not present

## 2014-04-28 DIAGNOSIS — H34811 Central retinal vein occlusion, right eye: Secondary | ICD-10-CM | POA: Diagnosis not present

## 2014-04-28 DIAGNOSIS — H409 Unspecified glaucoma: Secondary | ICD-10-CM | POA: Diagnosis not present

## 2014-04-28 DIAGNOSIS — H2011 Chronic iridocyclitis, right eye: Secondary | ICD-10-CM | POA: Diagnosis not present

## 2014-05-03 DIAGNOSIS — H348112 Central retinal vein occlusion, right eye, stable: Secondary | ICD-10-CM | POA: Insufficient documentation

## 2014-05-07 DIAGNOSIS — H209 Unspecified iridocyclitis: Secondary | ICD-10-CM | POA: Diagnosis not present

## 2014-05-07 DIAGNOSIS — M2550 Pain in unspecified joint: Secondary | ICD-10-CM | POA: Diagnosis not present

## 2014-05-08 DIAGNOSIS — M2550 Pain in unspecified joint: Secondary | ICD-10-CM | POA: Diagnosis not present

## 2014-05-08 DIAGNOSIS — D8989 Other specified disorders involving the immune mechanism, not elsewhere classified: Secondary | ICD-10-CM | POA: Diagnosis not present

## 2014-06-11 DIAGNOSIS — H35371 Puckering of macula, right eye: Secondary | ICD-10-CM | POA: Diagnosis not present

## 2014-06-11 DIAGNOSIS — H59031 Cystoid macular edema following cataract surgery, right eye: Secondary | ICD-10-CM | POA: Diagnosis not present

## 2014-07-03 DIAGNOSIS — H4011X2 Primary open-angle glaucoma, moderate stage: Secondary | ICD-10-CM | POA: Diagnosis not present

## 2014-07-03 DIAGNOSIS — H25012 Cortical age-related cataract, left eye: Secondary | ICD-10-CM | POA: Diagnosis not present

## 2014-07-03 DIAGNOSIS — H26491 Other secondary cataract, right eye: Secondary | ICD-10-CM | POA: Diagnosis not present

## 2014-07-23 DIAGNOSIS — H35371 Puckering of macula, right eye: Secondary | ICD-10-CM | POA: Diagnosis not present

## 2014-07-23 DIAGNOSIS — H59031 Cystoid macular edema following cataract surgery, right eye: Secondary | ICD-10-CM | POA: Diagnosis not present

## 2014-07-23 DIAGNOSIS — M329 Systemic lupus erythematosus, unspecified: Secondary | ICD-10-CM | POA: Diagnosis not present

## 2014-08-05 DIAGNOSIS — Z96652 Presence of left artificial knee joint: Secondary | ICD-10-CM | POA: Insufficient documentation

## 2014-08-13 DIAGNOSIS — H26491 Other secondary cataract, right eye: Secondary | ICD-10-CM | POA: Diagnosis not present

## 2014-08-13 DIAGNOSIS — H2640 Unspecified secondary cataract: Secondary | ICD-10-CM | POA: Diagnosis not present

## 2014-08-21 DIAGNOSIS — H59031 Cystoid macular edema following cataract surgery, right eye: Secondary | ICD-10-CM | POA: Diagnosis not present

## 2014-09-17 DIAGNOSIS — H59031 Cystoid macular edema following cataract surgery, right eye: Secondary | ICD-10-CM | POA: Diagnosis not present

## 2014-10-20 DIAGNOSIS — M545 Low back pain: Secondary | ICD-10-CM | POA: Diagnosis not present

## 2014-10-20 DIAGNOSIS — M47896 Other spondylosis, lumbar region: Secondary | ICD-10-CM | POA: Diagnosis not present

## 2014-10-27 DIAGNOSIS — H35371 Puckering of macula, right eye: Secondary | ICD-10-CM | POA: Diagnosis not present

## 2014-10-27 DIAGNOSIS — H59031 Cystoid macular edema following cataract surgery, right eye: Secondary | ICD-10-CM | POA: Diagnosis not present

## 2014-10-30 DIAGNOSIS — M47896 Other spondylosis, lumbar region: Secondary | ICD-10-CM | POA: Diagnosis not present

## 2014-10-30 DIAGNOSIS — M545 Low back pain: Secondary | ICD-10-CM | POA: Diagnosis not present

## 2014-11-17 DIAGNOSIS — H209 Unspecified iridocyclitis: Secondary | ICD-10-CM | POA: Diagnosis not present

## 2014-11-17 DIAGNOSIS — M329 Systemic lupus erythematosus, unspecified: Secondary | ICD-10-CM | POA: Diagnosis not present

## 2014-12-03 DIAGNOSIS — H59031 Cystoid macular edema following cataract surgery, right eye: Secondary | ICD-10-CM | POA: Diagnosis not present

## 2014-12-03 DIAGNOSIS — H20021 Recurrent acute iridocyclitis, right eye: Secondary | ICD-10-CM | POA: Diagnosis not present

## 2014-12-21 DIAGNOSIS — Z961 Presence of intraocular lens: Secondary | ICD-10-CM | POA: Diagnosis not present

## 2014-12-21 DIAGNOSIS — H4011X3 Primary open-angle glaucoma, severe stage: Secondary | ICD-10-CM | POA: Diagnosis not present

## 2014-12-22 DIAGNOSIS — J387 Other diseases of larynx: Secondary | ICD-10-CM | POA: Diagnosis not present

## 2014-12-23 DIAGNOSIS — Z96652 Presence of left artificial knee joint: Secondary | ICD-10-CM | POA: Diagnosis not present

## 2014-12-23 DIAGNOSIS — Z96653 Presence of artificial knee joint, bilateral: Secondary | ICD-10-CM | POA: Diagnosis not present

## 2014-12-23 DIAGNOSIS — M7062 Trochanteric bursitis, left hip: Secondary | ICD-10-CM | POA: Diagnosis not present

## 2014-12-30 DIAGNOSIS — H209 Unspecified iridocyclitis: Secondary | ICD-10-CM | POA: Diagnosis not present

## 2014-12-30 DIAGNOSIS — R5383 Other fatigue: Secondary | ICD-10-CM | POA: Diagnosis not present

## 2014-12-30 DIAGNOSIS — M329 Systemic lupus erythematosus, unspecified: Secondary | ICD-10-CM | POA: Diagnosis not present

## 2014-12-30 DIAGNOSIS — Z79899 Other long term (current) drug therapy: Secondary | ICD-10-CM | POA: Diagnosis not present

## 2015-01-20 DIAGNOSIS — E039 Hypothyroidism, unspecified: Secondary | ICD-10-CM | POA: Diagnosis not present

## 2015-01-20 DIAGNOSIS — I10 Essential (primary) hypertension: Secondary | ICD-10-CM | POA: Diagnosis not present

## 2015-01-20 DIAGNOSIS — M329 Systemic lupus erythematosus, unspecified: Secondary | ICD-10-CM | POA: Diagnosis not present

## 2015-01-20 DIAGNOSIS — E785 Hyperlipidemia, unspecified: Secondary | ICD-10-CM | POA: Diagnosis not present

## 2015-02-02 DIAGNOSIS — H59031 Cystoid macular edema following cataract surgery, right eye: Secondary | ICD-10-CM | POA: Diagnosis not present

## 2015-02-02 DIAGNOSIS — H2011 Chronic iridocyclitis, right eye: Secondary | ICD-10-CM | POA: Diagnosis not present

## 2015-02-02 DIAGNOSIS — H35371 Puckering of macula, right eye: Secondary | ICD-10-CM | POA: Diagnosis not present

## 2015-02-10 DIAGNOSIS — M47816 Spondylosis without myelopathy or radiculopathy, lumbar region: Secondary | ICD-10-CM | POA: Diagnosis not present

## 2015-02-12 DIAGNOSIS — Z1231 Encounter for screening mammogram for malignant neoplasm of breast: Secondary | ICD-10-CM | POA: Diagnosis not present

## 2015-02-16 DIAGNOSIS — H59031 Cystoid macular edema following cataract surgery, right eye: Secondary | ICD-10-CM | POA: Diagnosis not present

## 2015-02-19 DIAGNOSIS — M329 Systemic lupus erythematosus, unspecified: Secondary | ICD-10-CM | POA: Diagnosis not present

## 2015-02-19 DIAGNOSIS — M5136 Other intervertebral disc degeneration, lumbar region: Secondary | ICD-10-CM | POA: Diagnosis not present

## 2015-02-19 DIAGNOSIS — M150 Primary generalized (osteo)arthritis: Secondary | ICD-10-CM | POA: Diagnosis not present

## 2015-02-19 DIAGNOSIS — H2011 Chronic iridocyclitis, right eye: Secondary | ICD-10-CM | POA: Diagnosis not present

## 2015-03-12 ENCOUNTER — Other Ambulatory Visit (HOSPITAL_COMMUNITY): Payer: Self-pay | Admitting: Orthopedic Surgery

## 2015-03-12 DIAGNOSIS — M25562 Pain in left knee: Principal | ICD-10-CM

## 2015-03-12 DIAGNOSIS — Z23 Encounter for immunization: Secondary | ICD-10-CM | POA: Diagnosis not present

## 2015-03-12 DIAGNOSIS — M25561 Pain in right knee: Secondary | ICD-10-CM

## 2015-03-19 ENCOUNTER — Encounter (HOSPITAL_COMMUNITY): Payer: Medicare PPO

## 2015-03-25 ENCOUNTER — Encounter (HOSPITAL_COMMUNITY)
Admission: RE | Admit: 2015-03-25 | Discharge: 2015-03-25 | Disposition: A | Discharge status: Home / Self Care | Payer: Medicare PPO | Source: Ambulatory Visit | Attending: Orthopedic Surgery | Admitting: Orthopedic Surgery

## 2015-03-25 DIAGNOSIS — M25562 Pain in left knee: Secondary | ICD-10-CM | POA: Diagnosis not present

## 2015-03-25 DIAGNOSIS — M25561 Pain in right knee: Secondary | ICD-10-CM | POA: Insufficient documentation

## 2015-03-25 DIAGNOSIS — R948 Abnormal results of function studies of other organs and systems: Secondary | ICD-10-CM | POA: Diagnosis not present

## 2015-03-25 MED ORDER — TECHNETIUM TC 99M MEDRONATE IV KIT
25.0000 | PACK | Freq: Once | INTRAVENOUS | Status: AC | PRN
  Administered 2015-03-25: 25 via INTRAVENOUS

## 2015-03-30 DIAGNOSIS — H59031 Cystoid macular edema following cataract surgery, right eye: Secondary | ICD-10-CM | POA: Diagnosis not present

## 2015-05-06 DIAGNOSIS — M542 Cervicalgia: Secondary | ICD-10-CM | POA: Diagnosis not present

## 2015-05-21 DIAGNOSIS — E559 Vitamin D deficiency, unspecified: Secondary | ICD-10-CM | POA: Diagnosis not present

## 2015-05-21 DIAGNOSIS — M150 Primary generalized (osteo)arthritis: Secondary | ICD-10-CM | POA: Diagnosis not present

## 2015-05-21 DIAGNOSIS — M5136 Other intervertebral disc degeneration, lumbar region: Secondary | ICD-10-CM | POA: Diagnosis not present

## 2015-05-21 DIAGNOSIS — M329 Systemic lupus erythematosus, unspecified: Secondary | ICD-10-CM | POA: Diagnosis not present

## 2015-05-21 DIAGNOSIS — M797 Fibromyalgia: Secondary | ICD-10-CM | POA: Diagnosis not present

## 2015-05-21 DIAGNOSIS — R5383 Other fatigue: Secondary | ICD-10-CM | POA: Diagnosis not present

## 2015-05-21 DIAGNOSIS — H2011 Chronic iridocyclitis, right eye: Secondary | ICD-10-CM | POA: Diagnosis not present

## 2015-05-28 DIAGNOSIS — M542 Cervicalgia: Secondary | ICD-10-CM | POA: Diagnosis not present

## 2015-06-03 ENCOUNTER — Other Ambulatory Visit: Payer: Self-pay | Admitting: Orthopedic Surgery

## 2015-06-03 DIAGNOSIS — M542 Cervicalgia: Secondary | ICD-10-CM

## 2015-06-14 ENCOUNTER — Other Ambulatory Visit: Payer: PRIVATE HEALTH INSURANCE

## 2015-06-21 ENCOUNTER — Ambulatory Visit
Admission: RE | Admit: 2015-06-21 | Discharge: 2015-06-21 | Disposition: A | Discharge status: Home / Self Care | Payer: Medicare PPO | Source: Ambulatory Visit | Attending: Orthopedic Surgery | Admitting: Orthopedic Surgery

## 2015-06-21 DIAGNOSIS — M542 Cervicalgia: Secondary | ICD-10-CM

## 2015-06-21 DIAGNOSIS — M4802 Spinal stenosis, cervical region: Secondary | ICD-10-CM | POA: Diagnosis not present

## 2015-06-22 DIAGNOSIS — H35371 Puckering of macula, right eye: Secondary | ICD-10-CM | POA: Diagnosis not present

## 2015-06-22 DIAGNOSIS — H59031 Cystoid macular edema following cataract surgery, right eye: Secondary | ICD-10-CM | POA: Diagnosis not present

## 2015-06-23 DIAGNOSIS — M542 Cervicalgia: Secondary | ICD-10-CM | POA: Diagnosis not present

## 2015-07-13 DIAGNOSIS — M5412 Radiculopathy, cervical region: Secondary | ICD-10-CM | POA: Diagnosis not present

## 2015-07-16 DIAGNOSIS — M329 Systemic lupus erythematosus, unspecified: Secondary | ICD-10-CM | POA: Diagnosis not present

## 2015-07-16 DIAGNOSIS — M5136 Other intervertebral disc degeneration, lumbar region: Secondary | ICD-10-CM | POA: Diagnosis not present

## 2015-07-16 DIAGNOSIS — M797 Fibromyalgia: Secondary | ICD-10-CM | POA: Diagnosis not present

## 2015-07-16 DIAGNOSIS — E559 Vitamin D deficiency, unspecified: Secondary | ICD-10-CM | POA: Diagnosis not present

## 2015-07-16 DIAGNOSIS — M150 Primary generalized (osteo)arthritis: Secondary | ICD-10-CM | POA: Diagnosis not present

## 2015-07-16 DIAGNOSIS — H2011 Chronic iridocyclitis, right eye: Secondary | ICD-10-CM | POA: Diagnosis not present

## 2015-07-16 DIAGNOSIS — R5383 Other fatigue: Secondary | ICD-10-CM | POA: Diagnosis not present

## 2015-07-26 DIAGNOSIS — I10 Essential (primary) hypertension: Secondary | ICD-10-CM | POA: Insufficient documentation

## 2015-07-26 DIAGNOSIS — H6982 Other specified disorders of Eustachian tube, left ear: Secondary | ICD-10-CM | POA: Diagnosis not present

## 2015-07-26 DIAGNOSIS — M797 Fibromyalgia: Secondary | ICD-10-CM | POA: Insufficient documentation

## 2015-07-26 DIAGNOSIS — F419 Anxiety disorder, unspecified: Secondary | ICD-10-CM | POA: Insufficient documentation

## 2015-07-26 DIAGNOSIS — J309 Allergic rhinitis, unspecified: Secondary | ICD-10-CM | POA: Insufficient documentation

## 2015-07-26 DIAGNOSIS — K219 Gastro-esophageal reflux disease without esophagitis: Secondary | ICD-10-CM | POA: Insufficient documentation

## 2015-07-26 DIAGNOSIS — E7800 Pure hypercholesterolemia, unspecified: Secondary | ICD-10-CM | POA: Insufficient documentation

## 2015-08-04 DIAGNOSIS — Z0000 Encounter for general adult medical examination without abnormal findings: Secondary | ICD-10-CM | POA: Diagnosis not present

## 2015-08-04 DIAGNOSIS — I10 Essential (primary) hypertension: Secondary | ICD-10-CM | POA: Diagnosis not present

## 2015-08-04 DIAGNOSIS — E039 Hypothyroidism, unspecified: Secondary | ICD-10-CM | POA: Diagnosis not present

## 2015-08-04 DIAGNOSIS — E663 Overweight: Secondary | ICD-10-CM | POA: Diagnosis not present

## 2015-08-04 DIAGNOSIS — Z6830 Body mass index (BMI) 30.0-30.9, adult: Secondary | ICD-10-CM | POA: Diagnosis not present

## 2015-08-04 DIAGNOSIS — E7800 Pure hypercholesterolemia, unspecified: Secondary | ICD-10-CM | POA: Diagnosis not present

## 2015-08-04 DIAGNOSIS — M329 Systemic lupus erythematosus, unspecified: Secondary | ICD-10-CM | POA: Diagnosis not present

## 2015-08-04 DIAGNOSIS — Z1389 Encounter for screening for other disorder: Secondary | ICD-10-CM | POA: Diagnosis not present

## 2015-08-04 DIAGNOSIS — R350 Frequency of micturition: Secondary | ICD-10-CM | POA: Diagnosis not present

## 2015-08-12 DIAGNOSIS — M47816 Spondylosis without myelopathy or radiculopathy, lumbar region: Secondary | ICD-10-CM | POA: Diagnosis not present

## 2015-08-12 DIAGNOSIS — M545 Low back pain: Secondary | ICD-10-CM | POA: Diagnosis not present

## 2015-08-12 DIAGNOSIS — M542 Cervicalgia: Secondary | ICD-10-CM | POA: Diagnosis not present

## 2015-08-14 DIAGNOSIS — E785 Hyperlipidemia, unspecified: Secondary | ICD-10-CM | POA: Diagnosis not present

## 2015-08-14 DIAGNOSIS — M62838 Other muscle spasm: Secondary | ICD-10-CM | POA: Diagnosis not present

## 2015-08-14 DIAGNOSIS — I10 Essential (primary) hypertension: Secondary | ICD-10-CM | POA: Diagnosis not present

## 2015-08-14 DIAGNOSIS — K219 Gastro-esophageal reflux disease without esophagitis: Secondary | ICD-10-CM | POA: Diagnosis not present

## 2015-08-14 DIAGNOSIS — M545 Low back pain: Secondary | ICD-10-CM | POA: Diagnosis not present

## 2015-08-14 DIAGNOSIS — M329 Systemic lupus erythematosus, unspecified: Secondary | ICD-10-CM | POA: Diagnosis not present

## 2015-08-14 DIAGNOSIS — M1990 Unspecified osteoarthritis, unspecified site: Secondary | ICD-10-CM | POA: Diagnosis not present

## 2015-08-14 DIAGNOSIS — Z6830 Body mass index (BMI) 30.0-30.9, adult: Secondary | ICD-10-CM | POA: Diagnosis not present

## 2015-08-14 DIAGNOSIS — E039 Hypothyroidism, unspecified: Secondary | ICD-10-CM | POA: Diagnosis not present

## 2015-08-23 DIAGNOSIS — H903 Sensorineural hearing loss, bilateral: Secondary | ICD-10-CM | POA: Diagnosis not present

## 2015-08-23 DIAGNOSIS — H9202 Otalgia, left ear: Secondary | ICD-10-CM | POA: Diagnosis not present

## 2015-08-23 DIAGNOSIS — M26629 Arthralgia of temporomandibular joint, unspecified side: Secondary | ICD-10-CM | POA: Insufficient documentation

## 2015-08-23 DIAGNOSIS — M26622 Arthralgia of left temporomandibular joint: Secondary | ICD-10-CM | POA: Diagnosis not present

## 2015-09-16 DIAGNOSIS — M79644 Pain in right finger(s): Secondary | ICD-10-CM | POA: Diagnosis not present

## 2015-09-16 DIAGNOSIS — M25571 Pain in right ankle and joints of right foot: Secondary | ICD-10-CM | POA: Diagnosis not present

## 2015-09-16 DIAGNOSIS — M79645 Pain in left finger(s): Secondary | ICD-10-CM | POA: Diagnosis not present

## 2015-09-20 ENCOUNTER — Other Ambulatory Visit: Payer: Self-pay | Admitting: Orthopedic Surgery

## 2015-09-20 DIAGNOSIS — M25571 Pain in right ankle and joints of right foot: Secondary | ICD-10-CM

## 2015-09-21 DIAGNOSIS — H59031 Cystoid macular edema following cataract surgery, right eye: Secondary | ICD-10-CM | POA: Diagnosis not present

## 2015-09-21 DIAGNOSIS — H35371 Puckering of macula, right eye: Secondary | ICD-10-CM | POA: Diagnosis not present

## 2015-09-28 ENCOUNTER — Ambulatory Visit
Admission: RE | Admit: 2015-09-28 | Discharge: 2015-09-28 | Disposition: A | Discharge status: Home / Self Care | Payer: Medicare PPO | Source: Ambulatory Visit | Attending: Orthopedic Surgery | Admitting: Orthopedic Surgery

## 2015-09-28 DIAGNOSIS — M25571 Pain in right ankle and joints of right foot: Secondary | ICD-10-CM

## 2015-09-28 DIAGNOSIS — M19071 Primary osteoarthritis, right ankle and foot: Secondary | ICD-10-CM | POA: Diagnosis not present

## 2015-10-07 DIAGNOSIS — M25571 Pain in right ankle and joints of right foot: Secondary | ICD-10-CM | POA: Diagnosis not present

## 2015-10-07 DIAGNOSIS — M79644 Pain in right finger(s): Secondary | ICD-10-CM | POA: Diagnosis not present

## 2015-10-07 DIAGNOSIS — M79645 Pain in left finger(s): Secondary | ICD-10-CM | POA: Diagnosis not present

## 2015-10-11 DIAGNOSIS — E785 Hyperlipidemia, unspecified: Secondary | ICD-10-CM | POA: Diagnosis not present

## 2015-10-20 DIAGNOSIS — M4806 Spinal stenosis, lumbar region: Secondary | ICD-10-CM | POA: Diagnosis not present

## 2015-10-20 DIAGNOSIS — M5415 Radiculopathy, thoracolumbar region: Secondary | ICD-10-CM | POA: Diagnosis not present

## 2015-10-25 DIAGNOSIS — M5126 Other intervertebral disc displacement, lumbar region: Secondary | ICD-10-CM | POA: Diagnosis not present

## 2015-10-25 DIAGNOSIS — M4316 Spondylolisthesis, lumbar region: Secondary | ICD-10-CM | POA: Diagnosis not present

## 2015-11-02 DIAGNOSIS — M47816 Spondylosis without myelopathy or radiculopathy, lumbar region: Secondary | ICD-10-CM | POA: Diagnosis not present

## 2015-11-02 DIAGNOSIS — M5416 Radiculopathy, lumbar region: Secondary | ICD-10-CM | POA: Diagnosis not present

## 2015-11-09 DIAGNOSIS — H401123 Primary open-angle glaucoma, left eye, severe stage: Secondary | ICD-10-CM | POA: Diagnosis not present

## 2015-11-09 DIAGNOSIS — H401113 Primary open-angle glaucoma, right eye, severe stage: Secondary | ICD-10-CM | POA: Diagnosis not present

## 2015-11-19 DIAGNOSIS — E559 Vitamin D deficiency, unspecified: Secondary | ICD-10-CM | POA: Diagnosis not present

## 2015-11-19 DIAGNOSIS — M150 Primary generalized (osteo)arthritis: Secondary | ICD-10-CM | POA: Diagnosis not present

## 2015-11-19 DIAGNOSIS — M797 Fibromyalgia: Secondary | ICD-10-CM | POA: Diagnosis not present

## 2015-11-19 DIAGNOSIS — M329 Systemic lupus erythematosus, unspecified: Secondary | ICD-10-CM | POA: Diagnosis not present

## 2015-11-19 DIAGNOSIS — H2011 Chronic iridocyclitis, right eye: Secondary | ICD-10-CM | POA: Diagnosis not present

## 2015-11-19 DIAGNOSIS — R5383 Other fatigue: Secondary | ICD-10-CM | POA: Diagnosis not present

## 2015-11-19 DIAGNOSIS — M5136 Other intervertebral disc degeneration, lumbar region: Secondary | ICD-10-CM | POA: Diagnosis not present

## 2015-11-22 DIAGNOSIS — M47816 Spondylosis without myelopathy or radiculopathy, lumbar region: Secondary | ICD-10-CM | POA: Diagnosis not present

## 2015-12-03 DIAGNOSIS — H401123 Primary open-angle glaucoma, left eye, severe stage: Secondary | ICD-10-CM | POA: Diagnosis not present

## 2015-12-03 DIAGNOSIS — Z79899 Other long term (current) drug therapy: Secondary | ICD-10-CM | POA: Diagnosis not present

## 2015-12-03 DIAGNOSIS — H401113 Primary open-angle glaucoma, right eye, severe stage: Secondary | ICD-10-CM | POA: Diagnosis not present

## 2015-12-22 DIAGNOSIS — Z4789 Encounter for other orthopedic aftercare: Secondary | ICD-10-CM | POA: Diagnosis not present

## 2015-12-22 DIAGNOSIS — M25462 Effusion, left knee: Secondary | ICD-10-CM | POA: Diagnosis not present

## 2015-12-22 DIAGNOSIS — Z96653 Presence of artificial knee joint, bilateral: Secondary | ICD-10-CM | POA: Diagnosis not present

## 2015-12-22 DIAGNOSIS — M7052 Other bursitis of knee, left knee: Secondary | ICD-10-CM | POA: Diagnosis not present

## 2015-12-22 DIAGNOSIS — Z471 Aftercare following joint replacement surgery: Secondary | ICD-10-CM | POA: Diagnosis not present

## 2015-12-22 DIAGNOSIS — Z96652 Presence of left artificial knee joint: Secondary | ICD-10-CM | POA: Diagnosis not present

## 2015-12-28 DIAGNOSIS — M545 Low back pain: Secondary | ICD-10-CM | POA: Diagnosis not present

## 2015-12-28 DIAGNOSIS — M47816 Spondylosis without myelopathy or radiculopathy, lumbar region: Secondary | ICD-10-CM | POA: Diagnosis not present

## 2016-01-05 DIAGNOSIS — M545 Low back pain: Secondary | ICD-10-CM | POA: Diagnosis not present

## 2016-01-05 DIAGNOSIS — M47816 Spondylosis without myelopathy or radiculopathy, lumbar region: Secondary | ICD-10-CM | POA: Diagnosis not present

## 2016-01-20 DIAGNOSIS — M545 Low back pain: Secondary | ICD-10-CM | POA: Diagnosis not present

## 2016-02-04 DIAGNOSIS — Z6834 Body mass index (BMI) 34.0-34.9, adult: Secondary | ICD-10-CM | POA: Diagnosis not present

## 2016-02-04 DIAGNOSIS — I10 Essential (primary) hypertension: Secondary | ICD-10-CM | POA: Diagnosis not present

## 2016-02-04 DIAGNOSIS — E039 Hypothyroidism, unspecified: Secondary | ICD-10-CM | POA: Diagnosis not present

## 2016-02-04 DIAGNOSIS — Z23 Encounter for immunization: Secondary | ICD-10-CM | POA: Diagnosis not present

## 2016-02-04 DIAGNOSIS — E7800 Pure hypercholesterolemia, unspecified: Secondary | ICD-10-CM | POA: Diagnosis not present

## 2016-02-04 DIAGNOSIS — E663 Overweight: Secondary | ICD-10-CM | POA: Diagnosis not present

## 2016-02-18 DIAGNOSIS — Z1231 Encounter for screening mammogram for malignant neoplasm of breast: Secondary | ICD-10-CM | POA: Diagnosis not present

## 2016-03-09 DIAGNOSIS — H6982 Other specified disorders of Eustachian tube, left ear: Secondary | ICD-10-CM | POA: Diagnosis not present

## 2016-03-09 DIAGNOSIS — K219 Gastro-esophageal reflux disease without esophagitis: Secondary | ICD-10-CM | POA: Diagnosis not present

## 2016-03-28 DIAGNOSIS — H43812 Vitreous degeneration, left eye: Secondary | ICD-10-CM | POA: Diagnosis not present

## 2016-03-28 DIAGNOSIS — H35371 Puckering of macula, right eye: Secondary | ICD-10-CM | POA: Diagnosis not present

## 2016-04-06 ENCOUNTER — Ambulatory Visit (INDEPENDENT_AMBULATORY_CARE_PROVIDER_SITE_OTHER): Payer: Medicare PPO

## 2016-04-06 ENCOUNTER — Encounter (INDEPENDENT_AMBULATORY_CARE_PROVIDER_SITE_OTHER): Payer: Self-pay | Admitting: Orthopedic Surgery

## 2016-04-06 ENCOUNTER — Ambulatory Visit (INDEPENDENT_AMBULATORY_CARE_PROVIDER_SITE_OTHER): Payer: Medicare PPO | Admitting: Orthopedic Surgery

## 2016-04-06 VITALS — BP 139/67 | HR 66 | Ht 66.5 in | Wt 190.0 lb

## 2016-04-06 DIAGNOSIS — M25562 Pain in left knee: Secondary | ICD-10-CM

## 2016-04-06 DIAGNOSIS — Z96652 Presence of left artificial knee joint: Secondary | ICD-10-CM | POA: Diagnosis not present

## 2016-04-06 DIAGNOSIS — M7062 Trochanteric bursitis, left hip: Secondary | ICD-10-CM | POA: Diagnosis not present

## 2016-04-06 DIAGNOSIS — G8929 Other chronic pain: Secondary | ICD-10-CM | POA: Diagnosis not present

## 2016-04-06 DIAGNOSIS — M25511 Pain in right shoulder: Secondary | ICD-10-CM | POA: Diagnosis not present

## 2016-04-06 MED ORDER — BUPIVACAINE HCL 0.5 % IJ SOLN
9.0000 mL | INTRAMUSCULAR | Status: AC | PRN
  Administered 2016-04-06: 9 mL via INTRA_ARTICULAR

## 2016-04-06 MED ORDER — BUPIVACAINE HCL 0.25 % IJ SOLN
4.0000 mL | INTRAMUSCULAR | Status: AC | PRN
  Administered 2016-04-06: 4 mL via INTRA_ARTICULAR

## 2016-04-06 MED ORDER — METHYLPREDNISOLONE ACETATE 40 MG/ML IJ SUSP
40.0000 mg | INTRAMUSCULAR | Status: AC | PRN
  Administered 2016-04-06: 40 mg via INTRA_ARTICULAR

## 2016-04-06 MED ORDER — LIDOCAINE HCL 1 % IJ SOLN
5.0000 mL | INTRAMUSCULAR | Status: AC | PRN
  Administered 2016-04-06: 5 mL

## 2016-04-06 NOTE — Progress Notes (Signed)
Office Visit Note   Patient: Barbara Patton           Date of Birth: 03-18-1941           MRN: [id number] Visit Date: 04/06/2016 Requested by: Seward Carol, MD [address] [place], [place] [address] PCP: Kandice Hams, MD  Subjective: Chief Complaint  Patient presents with  . Left Hip - Pain  . Left Knee - Pain  . Right Shoulder - Pain  Barbara Patton is a 75 year old patient with complaint of medial left knee pain left trochanteric pain and right shoulder pain She's had a total knee [facility] in [facility] itches also required revision.  She localizes pain to the medial aspect of the knee.  Denies any fever or chills.  She also describes left hip trochanteric pain.  Denies any groin pain but has had injections in the trochanteric bursa before which have helped her.  She has a hard time sleeping on that left side.  Patient also describes right shoulder pain.  She has small rotator cuff tear in that right shoulder but has not had surgery.  In general she's fairly functional but requests injection the right shoulder as well.  She's been taking over-the-counter medications with some relief.  She denies any fevers and chills  Patient complains of left hip/troch pain and left knee pain.  Patient is requesting left troch injection, says we have injected in the past and it helps her.  The left knee is hurting, she is s/p L TKA 2009, with subsequent revision 2014 approx.  All done at [facility]. She is requesting an x ray today of the left knee. She feels pain at the knee, medially and into anterior tibia, like something is sticking her when she extends/lifts leg.                  Review of Systems all systems reviewed negative as they relate to the chief complaint.  Specifically no radicular signs or symptoms in the right or left arm no fevers and chills no groin pain with internal/external rotation on the left-hand side   Assessment & Plan: Visit Diagnoses:  1. Trochanteric  bursitis, left hip   2. Presence of left artificial knee joint   3. Chronic pain of left knee   4. Chronic right shoulder pain     Plan: Impression is left knee pain without evidence of loosening or infection on exam.  She has some laxity to valgus testing.  I think she may have some hamstring tendinitis but nothing significant in terms of anything surgical that can be required on that left knee. In regards to the left hip she has no real groin pain with internal/external rotation.  Looks like trochanteric bursitis.  We will use an ultrasound-guided approach to inject the trochanteric bursa today  In regards to the right shoulder patient requesting injection into the shoulder for presumed bursitis and rotator cuff tendinitis.  Follow-Up Instructions: No Follow-up on file.   Orders:  Orders Placed This Encounter  Procedures  . XR Knee 1-2 Views Left   No orders of the defined types were placed in this encounter.     Procedures: Large Joint Inj Date/Time: 04/06/2016 9:56 AM Performed by: Meredith Pel Authorized by: Meredith Pel   Consent Given by:  Patient Site marked: the procedure site was marked   Timeout: prior to procedure the correct patient, procedure, and site was verified   Indications:  Pain and diagnostic evaluation  Location:  Shoulder Site:  R subacromial bursa Prep: patient was prepped and draped in usual sterile fashion   Needle Size:  18 G Needle Length:  1.5 inches Approach:  Posterior Ultrasound Guidance: No   Fluoroscopic Guidance: No   Arthrogram: No Medications:  5 mL lidocaine 1 %; 9 mL bupivacaine 0.5 %; 40 mg methylPREDNISolone acetate 40 MG/ML Aspiration Attempted: No   Patient tolerance:  Patient tolerated the procedure well with no immediate complications Large Joint Inj Date/Time: 04/06/2016 9:57 AM Performed by: Meredith Pel Authorized by: Meredith Pel   Consent Given by:  Patient Site marked: the procedure site was  marked   Timeout: prior to procedure the correct patient, procedure, and site was verified   Indications:  Pain and diagnostic evaluation Location:  Hip Site:  L greater trochanter Prep: patient was prepped and draped in usual sterile fashion   Needle Size:  18 G Needle Length:  3.5 inches Approach:  Lateral Ultrasound Guidance: Yes   Fluoroscopic Guidance: No   Arthrogram: No Medications:  5 mL lidocaine 1 %; 4 mL bupivacaine 0.25 %; 40 mg methylPREDNISolone acetate 40 MG/ML Aspiration Attempted: No   Patient tolerance:  Patient tolerated the procedure well with no immediate complications     Clinical Data: No additional findings.  Objective: Vital Signs: BP 139/67   Pulse 66   Ht 5' 6.5" (1.689 m)   Wt 190 lb (86.2 kg)   BMI 30.21 kg/m   Physical Exam  Constitutional: She appears well-developed.  HENT:  Head: Normocephalic.  Eyes: EOM are normal.  Neck: Normal range of motion.  Cardiovascular: Normal rate.   Pulmonary/Chest: Effort normal.  Neurological: She is alert.  Skin: Skin is warm.  Psychiatric: She has a normal mood and affect.    Ortho Exam examination the left knee demonstrates no effusion no warmth flexion past 90 extensor mechanism is intact collaterals are stable but there is slight laxity to valgus testing at 0 and 30.  Pedal pulses palpable there is no groin pain with internal/external rotation of the left leg.  Extensor mechanism is intact.  There is trochanteric tenderness on the left compared to the right.  Hip flexion abduction and adduction strength is symmetric.  On the right shoulder she has full active and passive range of motion with only slight coarseness with internal/external rotation at 90 of abduction.  Rotator cuff strength is intact to infraspinatus supraspinous subscap muscle testing.  No before meals joint tenderness direct palpation or cross arm adduction.  No other masses lymph adenopathy or skin changes noted in the shoulder region  the left hip region or the left knee region  Specialty Comments:  No specialty comments available.  Imaging: Xr Knee 1-2 Views Left  Result Date: 04/06/2016 AP lateral left knee ordered and obtained.  Revision total knee prosthesis on the tibial side is visualized and appears to not have any evidence of loosening or radiolucency.  Femoral component in good position as well on the left-hand side on the right-hand side total knee prosthesis also in good position without evidence of loosening or lucencies around the bone cement interface    PMFS History: There are no active problems to display for this patient.  Past Medical History:  Diagnosis Date  . High cholesterol   . Hypertension   . Thyroid disease     No family history on file.  Past Surgical History:  Procedure Laterality Date  . JOINT REPLACEMENT     Social History  Occupational History  . Not on file.   Social History Main Topics  . Smoking status: Former Smoker    Quit date: 1997  . Smokeless tobacco: Never Used  . Alcohol use 0.6 oz/week    1 Glasses of wine per week     Comment: occ wine  . Drug use: No  . Sexual activity: Not on file

## 2016-06-01 DIAGNOSIS — M150 Primary generalized (osteo)arthritis: Secondary | ICD-10-CM | POA: Diagnosis not present

## 2016-06-01 DIAGNOSIS — R5383 Other fatigue: Secondary | ICD-10-CM | POA: Diagnosis not present

## 2016-06-01 DIAGNOSIS — Z6830 Body mass index (BMI) 30.0-30.9, adult: Secondary | ICD-10-CM | POA: Diagnosis not present

## 2016-06-01 DIAGNOSIS — M5136 Other intervertebral disc degeneration, lumbar region: Secondary | ICD-10-CM | POA: Diagnosis not present

## 2016-06-01 DIAGNOSIS — E559 Vitamin D deficiency, unspecified: Secondary | ICD-10-CM | POA: Diagnosis not present

## 2016-06-01 DIAGNOSIS — E669 Obesity, unspecified: Secondary | ICD-10-CM | POA: Diagnosis not present

## 2016-06-01 DIAGNOSIS — H2011 Chronic iridocyclitis, right eye: Secondary | ICD-10-CM | POA: Diagnosis not present

## 2016-06-01 DIAGNOSIS — M797 Fibromyalgia: Secondary | ICD-10-CM | POA: Diagnosis not present

## 2016-06-01 DIAGNOSIS — M329 Systemic lupus erythematosus, unspecified: Secondary | ICD-10-CM | POA: Diagnosis not present

## 2016-06-13 DIAGNOSIS — H401113 Primary open-angle glaucoma, right eye, severe stage: Secondary | ICD-10-CM | POA: Diagnosis not present

## 2016-06-20 DIAGNOSIS — R51 Headache: Secondary | ICD-10-CM | POA: Diagnosis not present

## 2016-06-20 DIAGNOSIS — R0789 Other chest pain: Secondary | ICD-10-CM | POA: Diagnosis not present

## 2016-06-30 ENCOUNTER — Encounter (INDEPENDENT_AMBULATORY_CARE_PROVIDER_SITE_OTHER): Payer: Self-pay | Admitting: Orthopedic Surgery

## 2016-06-30 ENCOUNTER — Ambulatory Visit (INDEPENDENT_AMBULATORY_CARE_PROVIDER_SITE_OTHER): Payer: Medicare PPO

## 2016-06-30 ENCOUNTER — Ambulatory Visit (INDEPENDENT_AMBULATORY_CARE_PROVIDER_SITE_OTHER): Payer: Medicare PPO | Admitting: Orthopedic Surgery

## 2016-06-30 DIAGNOSIS — M79644 Pain in right finger(s): Secondary | ICD-10-CM

## 2016-06-30 MED ORDER — BUPIVACAINE HCL 0.25 % IJ SOLN
0.6600 mL | INTRAMUSCULAR | Status: AC | PRN
  Administered 2016-06-30: .66 mL via INTRA_ARTICULAR

## 2016-06-30 MED ORDER — LIDOCAINE HCL 1 % IJ SOLN
3.0000 mL | INTRAMUSCULAR | Status: AC | PRN
  Administered 2016-06-30: 3 mL

## 2016-06-30 MED ORDER — BUPIVACAINE HCL 0.5 % IJ SOLN
1.0000 mL | INTRAMUSCULAR | Status: AC | PRN
  Administered 2016-06-30: 1 mL via INTRA_ARTICULAR

## 2016-06-30 NOTE — Progress Notes (Signed)
Office Visit Note   Patient: Barbara Patton           Date of Birth: 1941/05/17           MRN: [id number] Visit Date: 06/30/2016 Requested by: Seward Carol, MD [address] [place] [address] [place], [place] [address] PCP: Kandice Hams, MD  Subjective: Chief Complaint  Patient presents with  . Right Hand - Pain    HPI Barbara Patton is a 76 year old right-hand-dominant patient with a mass on the dorsal aspect of the PIP joint.  She also reports a mass at the metacarpal flexion crease of the third finger.  She also describes right hand thumb arthritis symptoms.  She's had increasing the size of that not on the index finger over the past week.  It has waxed and waned in terms of size over the past month.  She does report pain with pinching in the thumb area.  She states that the bursal injection into the left hip was beneficial.              Review of Systems All systems reviewed are negative as they relate to the chief complaint within the history of present illness.  Patient denies  fevers or chills.    Assessment & Plan: Visit Diagnoses:  1. Pain in right finger(s)     Plan: Impression is right hand dorsal ganglion cyst off the PIP joint which is about 3 x 3 mm in size.  I think that that cyst to be punctured but not really aspirated because of this extremely small size.  We'll hold off on that intervention for now.  Patient also has a volar ganglion of the tendon sheath of the third finger.  This appears to be coming right through the A2 pulley.  That's something that could be excised but at this point it is not really giving her any neurologic symptoms and only some symptoms which are mild with flexion.  In regards to the thumb [facility] arthritis injection performed fluoroscopically today.  I will see her back as needed  Follow-Up Instructions: Return if symptoms worsen or fail to improve.   Orders:  Orders Placed This Encounter  Procedures  . XR Finger Index Right   No orders of  the defined types were placed in this encounter.     Procedures: Small Joint Inj Date/Time: 06/30/2016 11:30 AM Performed by: Meredith Pel Authorized by: Meredith Pel   Consent Given by:  Patient Site marked: the procedure site was marked   Timeout: prior to procedure the correct patient, procedure, and site was verified   Indications:  Pain Location:  Thumb Site:  R thumb CMC Prep: patient was prepped and draped in usual sterile fashion   Needle Size:  25 G Approach:  Radial Ultrasound Guided: No   Fluoroscopic Guidance: Yes   Medications:  3 mL lidocaine 1 %; 1 mL bupivacaine 0.5 %; 0.66 mL bupivacaine 0.25 % Aspiration Attempted: No   Patient tolerance:  Patient tolerated the procedure well with no immediate complications      Clinical Data: No additional findings.  Objective: Vital Signs: There were no vitals taken for this visit.  Physical Exam   Constitutional: Patient appears well-developed HEENT:  Head: Normocephalic Eyes:EOM are normal Neck: Normal range of motion Cardiovascular: Normal rate Pulmonary/chest: Effort normal Neurologic: Patient is alert Skin: Skin is warm Psychiatric: Patient has normal mood and affect    Ortho Exam examination of the right hand demonstrates a mobile  dorsal ganglion cyst off the PIP joint of the second finger.  Her MCP PIP and DIP flexion is normal and symmetric.  Collaterals are stable.  Extensor mechanism to the index finger is intact.  She also has a palpable 1 x 1 mm volar retinacular ganglion cyst near the A2 pulley of the third finger.  No motor sensory function involvement from this small mass.  She also has positive grind test for [facility] arthritis but good EPL FPL function.  Radial pulse is intact.  Motor sensory function to the hand is otherwise intact  Specialty Comments:  No specialty comments available.  Imaging: Xr Finger Index Right  Result Date: 06/30/2016 3 views right index finger reviewed.   AP lateral and oblique.  Only mild degenerative changes present at the PIP joint.  No degenerative changes at the DIP and MCP joint.  Bone alignment is normal.  No other lesions noted within the phalanges or metacarpals.  CMC arthritis is noted    PMFS History: There are no active problems to display for this patient.  Past Medical History:  Diagnosis Date  . High cholesterol   . Hypertension   . Thyroid disease     No family history on file.  Past Surgical History:  Procedure Laterality Date  . JOINT REPLACEMENT     Social History   Occupational History  . Not on file.   Social History Main Topics  . Smoking status: Former Smoker    Quit date: 1997  . Smokeless tobacco: Never Used  . Alcohol use 0.6 oz/week    1 Glasses of wine per week     Comment: occ wine  . Drug use: No  . Sexual activity: Not on file

## 2016-07-17 DIAGNOSIS — L299 Pruritus, unspecified: Secondary | ICD-10-CM | POA: Diagnosis not present

## 2016-07-17 DIAGNOSIS — L821 Other seborrheic keratosis: Secondary | ICD-10-CM | POA: Diagnosis not present

## 2016-08-24 DIAGNOSIS — Z0000 Encounter for general adult medical examination without abnormal findings: Secondary | ICD-10-CM | POA: Diagnosis not present

## 2016-08-24 DIAGNOSIS — I10 Essential (primary) hypertension: Secondary | ICD-10-CM | POA: Diagnosis not present

## 2016-08-24 DIAGNOSIS — M329 Systemic lupus erythematosus, unspecified: Secondary | ICD-10-CM | POA: Diagnosis not present

## 2016-08-24 DIAGNOSIS — Z6830 Body mass index (BMI) 30.0-30.9, adult: Secondary | ICD-10-CM | POA: Diagnosis not present

## 2016-08-24 DIAGNOSIS — E7800 Pure hypercholesterolemia, unspecified: Secondary | ICD-10-CM | POA: Diagnosis not present

## 2016-08-24 DIAGNOSIS — E039 Hypothyroidism, unspecified: Secondary | ICD-10-CM | POA: Diagnosis not present

## 2016-08-24 DIAGNOSIS — Z1389 Encounter for screening for other disorder: Secondary | ICD-10-CM | POA: Diagnosis not present

## 2016-08-24 DIAGNOSIS — R32 Unspecified urinary incontinence: Secondary | ICD-10-CM | POA: Diagnosis not present

## 2016-08-24 DIAGNOSIS — E663 Overweight: Secondary | ICD-10-CM | POA: Diagnosis not present

## 2016-09-14 DIAGNOSIS — M545 Low back pain: Secondary | ICD-10-CM | POA: Diagnosis not present

## 2016-09-14 DIAGNOSIS — I10 Essential (primary) hypertension: Secondary | ICD-10-CM | POA: Diagnosis not present

## 2016-09-14 DIAGNOSIS — E785 Hyperlipidemia, unspecified: Secondary | ICD-10-CM | POA: Diagnosis not present

## 2016-09-14 DIAGNOSIS — E039 Hypothyroidism, unspecified: Secondary | ICD-10-CM | POA: Diagnosis not present

## 2016-09-14 DIAGNOSIS — Z90710 Acquired absence of both cervix and uterus: Secondary | ICD-10-CM | POA: Diagnosis not present

## 2016-09-14 DIAGNOSIS — Z6831 Body mass index (BMI) 31.0-31.9, adult: Secondary | ICD-10-CM | POA: Diagnosis not present

## 2016-09-14 DIAGNOSIS — M1990 Unspecified osteoarthritis, unspecified site: Secondary | ICD-10-CM | POA: Diagnosis not present

## 2016-09-14 DIAGNOSIS — Z79890 Hormone replacement therapy (postmenopausal): Secondary | ICD-10-CM | POA: Diagnosis not present

## 2016-09-14 DIAGNOSIS — K219 Gastro-esophageal reflux disease without esophagitis: Secondary | ICD-10-CM | POA: Diagnosis not present

## 2016-09-22 ENCOUNTER — Other Ambulatory Visit (INDEPENDENT_AMBULATORY_CARE_PROVIDER_SITE_OTHER): Payer: Self-pay | Admitting: Orthopedic Surgery

## 2016-09-22 NOTE — Telephone Encounter (Signed)
Rx request 

## 2016-09-26 DIAGNOSIS — H348322 Tributary (branch) retinal vein occlusion, left eye, stable: Secondary | ICD-10-CM | POA: Diagnosis not present

## 2016-09-26 DIAGNOSIS — H35371 Puckering of macula, right eye: Secondary | ICD-10-CM | POA: Diagnosis not present

## 2016-10-04 ENCOUNTER — Ambulatory Visit (INDEPENDENT_AMBULATORY_CARE_PROVIDER_SITE_OTHER): Payer: Medicare PPO | Admitting: Orthopedic Surgery

## 2016-10-04 ENCOUNTER — Ambulatory Visit (INDEPENDENT_AMBULATORY_CARE_PROVIDER_SITE_OTHER): Payer: Medicare PPO

## 2016-10-04 ENCOUNTER — Encounter (INDEPENDENT_AMBULATORY_CARE_PROVIDER_SITE_OTHER): Payer: Self-pay | Admitting: Orthopedic Surgery

## 2016-10-04 DIAGNOSIS — M7551 Bursitis of right shoulder: Secondary | ICD-10-CM

## 2016-10-04 DIAGNOSIS — M79641 Pain in right hand: Secondary | ICD-10-CM

## 2016-10-04 DIAGNOSIS — M79642 Pain in left hand: Secondary | ICD-10-CM | POA: Diagnosis not present

## 2016-10-04 MED ORDER — LIDOCAINE HCL 1 % IJ SOLN
5.0000 mL | INTRAMUSCULAR | Status: AC | PRN
  Administered 2016-10-04: 5 mL

## 2016-10-04 MED ORDER — METHYLPREDNISOLONE ACETATE 40 MG/ML IJ SUSP
40.0000 mg | INTRAMUSCULAR | Status: AC | PRN
  Administered 2016-10-04: 40 mg via INTRA_ARTICULAR

## 2016-10-04 MED ORDER — BUPIVACAINE HCL 0.25 % IJ SOLN
0.6600 mL | INTRAMUSCULAR | Status: AC | PRN
  Administered 2016-10-04: .66 mL via INTRA_ARTICULAR

## 2016-10-04 MED ORDER — BUPIVACAINE HCL 0.5 % IJ SOLN
1.0000 mL | INTRAMUSCULAR | Status: AC | PRN
  Administered 2016-10-04: 1 mL via INTRA_ARTICULAR

## 2016-10-04 MED ORDER — BUPIVACAINE HCL 0.5 % IJ SOLN
9.0000 mL | INTRAMUSCULAR | Status: AC | PRN
  Administered 2016-10-04: 9 mL via INTRA_ARTICULAR

## 2016-10-04 MED ORDER — LIDOCAINE HCL 1 % IJ SOLN
3.0000 mL | INTRAMUSCULAR | Status: AC | PRN
Start: 2016-10-04 — End: 2016-10-04
  Administered 2016-10-04: 3 mL

## 2016-10-04 NOTE — Progress Notes (Signed)
Office Visit Note   Patient: Barbara Patton           Date of Birth: 02-10-1941           MRN: [id number] Visit Date: 10/04/2016 Requested by: Seward Carol, MD [address] [place] [address] [place], [place] [address] PCP: Kandice Hams, MD  Subjective: Chief Complaint  Patient presents with  . Left Hand - Follow-up, Pain  . Right Hand - Follow-up, Pain    HPI: Barbara Patton is a 76 year old patient with both wall joint complaints today.  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  Right thumb injected January 2018.  She did get some relief from that injection.  She also reports right shoulder pain.  She has not had imaging studies of the right shoulder but likely has rotator cuff pathology on that side based on examination.  She's had left shoulder surgery and had rotator cuff repair with some recurrence of the tear over time.  She has a diagnosis of lupus and fibromyalgia.  She does go to the [facility] on routine basis.  She is on medications for lupus.  She feels some sharp pain in the right shoulder.  She doesn't want to get going on narcotic pain medicine.  She's seen a rheumatologist but it was a long time ago.  She's tried topical agents as well which helped.              ROS: All systems reviewed are negative as they relate to the chief complaint within the history of present illness.  Patient denies  fevers or chills.   Assessment & Plan: Visit Diagnoses:  1. Pain in both hands     Plan: Impression is multiple joint complaints and degenerative changes going on in both shoulders thumbs and knees on which she's had knee replacements.  Her main goal is to avoid any more surgery.  With that in mind we will continue to do episodic injections.  Today we will inject the right shoulder subacromial space as well as the [facility] joint on the right under fluoroscopic guidance.  In the future we could consider injection of other joints.  I'll see her back as  needed  Follow-Up Instructions: Return if symptoms worsen or fail to improve.   Orders:  Orders Placed This Encounter  Procedures  . XR C-ARM NO REPORT   No orders of the defined types were placed in this encounter.     Procedures: Large Joint Inj Date/Time: 10/04/2016 12:40 PM Performed by: Meredith Pel Authorized by: Meredith Pel   Consent Given by:  Patient Site marked: the procedure site was marked   Timeout: prior to procedure the correct patient, procedure, and site was verified   Indications:  Pain and diagnostic evaluation Location:  Shoulder Site:  R subacromial bursa Prep: patient was prepped and draped in usual sterile fashion   Needle Size:  18 G Needle Length:  1.5 inches Approach:  Posterior Ultrasound Guidance: No   Fluoroscopic Guidance: No   Arthrogram: No   Medications:  5 mL lidocaine 1 %; 9 mL bupivacaine 0.5 %; 40 mg methylPREDNISolone acetate 40 MG/ML Aspiration Attempted: No   Patient tolerance:  Patient tolerated the procedure well with no immediate complications Small Joint Inj Date/Time: 10/04/2016 12:41 PM Performed by: Meredith Pel Authorized by: Meredith Pel   Consent Given by:  Patient Site marked: the procedure site was marked   Timeout: prior to procedure  the correct patient, procedure, and site was verified   Indications:  Pain Location:  Thumb Site:  R thumb CMC Prep: patient was prepped and draped in usual sterile fashion   Needle Size:  25 G Approach:  Radial Ultrasound Guided: No   Fluoroscopic Guidance: Yes   Medications:  3 mL lidocaine 1 %; 1 mL bupivacaine 0.5 %; 0.66 mL bupivacaine 0.25 % Aspiration Attempted: No   Patient tolerance:  Patient tolerated the procedure well with no immediate complications   Radiographic imaging of the hand with the needle in position does Patton intra-articular placement into the right thumb CMC joint.  This is confirmed on multiple views using  fluoroscopy.  Clinical Data: No additional findings.  Objective: Vital Signs: There were no vitals taken for this visit.  Physical Exam:   Constitutional: Patient appears well-developed HEENT:  Head: Normocephalic Eyes:EOM are normal Neck: Normal range of motion Cardiovascular: Normal rate Pulmonary/chest: Effort normal Neurologic: Patient is alert Skin: Skin is warm Psychiatric: Patient has normal mood and affect    Ortho Exam: Orthopedic exam demonstrates pretty reasonable cervical spine range of motion she does have for flexion actively about 160 bilaterally.  She has reasonable rotator cuff strength isolatedsuper space subscap muscle testing but there is some coarseness with passive range of motion bilaterally.  Motor sensory function in the hand is intact radial pulses intact no other masses lymph and lateral skin changes noted in the shoulder region.  Does have positive grind test bilaterally in the [facility] joint.  Specialty Comments:  No specialty comments available.  Imaging: Xr C-arm No Report  Result Date: 10/04/2016 Please see Notes or Procedures tab for imaging impression.    PMFS History: There are no active problems to display for this patient.  Past Medical History:  Diagnosis Date  . High cholesterol   . Hypertension   . Thyroid disease     No family history on file.  Past Surgical History:  Procedure Laterality Date  . JOINT REPLACEMENT     Social History   Occupational History  . Not on file.   Social History Main Topics  . Smoking status: Former Smoker    Quit date: 1997  . Smokeless tobacco: Never Used  . Alcohol use 0.6 oz/week    1 Glasses of wine per week     Comment: occ wine  . Drug use: No  . Sexual activity: Not on file

## 2016-10-20 ENCOUNTER — Ambulatory Visit (INDEPENDENT_AMBULATORY_CARE_PROVIDER_SITE_OTHER): Payer: Medicare PPO | Admitting: Orthopedic Surgery

## 2016-11-20 DIAGNOSIS — N3281 Overactive bladder: Secondary | ICD-10-CM | POA: Diagnosis not present

## 2016-11-20 DIAGNOSIS — N3941 Urge incontinence: Secondary | ICD-10-CM | POA: Diagnosis not present

## 2016-11-20 DIAGNOSIS — R338 Other retention of urine: Secondary | ICD-10-CM | POA: Diagnosis not present

## 2016-11-30 DIAGNOSIS — E559 Vitamin D deficiency, unspecified: Secondary | ICD-10-CM | POA: Diagnosis not present

## 2016-11-30 DIAGNOSIS — M5136 Other intervertebral disc degeneration, lumbar region: Secondary | ICD-10-CM | POA: Diagnosis not present

## 2016-11-30 DIAGNOSIS — H2011 Chronic iridocyclitis, right eye: Secondary | ICD-10-CM | POA: Diagnosis not present

## 2016-11-30 DIAGNOSIS — Z6830 Body mass index (BMI) 30.0-30.9, adult: Secondary | ICD-10-CM | POA: Diagnosis not present

## 2016-11-30 DIAGNOSIS — M797 Fibromyalgia: Secondary | ICD-10-CM | POA: Diagnosis not present

## 2016-11-30 DIAGNOSIS — M329 Systemic lupus erythematosus, unspecified: Secondary | ICD-10-CM | POA: Diagnosis not present

## 2016-11-30 DIAGNOSIS — E669 Obesity, unspecified: Secondary | ICD-10-CM | POA: Diagnosis not present

## 2016-11-30 DIAGNOSIS — M150 Primary generalized (osteo)arthritis: Secondary | ICD-10-CM | POA: Diagnosis not present

## 2016-11-30 DIAGNOSIS — R5383 Other fatigue: Secondary | ICD-10-CM | POA: Diagnosis not present

## 2016-12-05 DIAGNOSIS — R300 Dysuria: Secondary | ICD-10-CM | POA: Diagnosis not present

## 2016-12-05 DIAGNOSIS — M6281 Muscle weakness (generalized): Secondary | ICD-10-CM | POA: Diagnosis not present

## 2016-12-05 DIAGNOSIS — M62838 Other muscle spasm: Secondary | ICD-10-CM | POA: Diagnosis not present

## 2016-12-05 DIAGNOSIS — N3281 Overactive bladder: Secondary | ICD-10-CM | POA: Diagnosis not present

## 2016-12-05 DIAGNOSIS — N3941 Urge incontinence: Secondary | ICD-10-CM | POA: Diagnosis not present

## 2016-12-06 DIAGNOSIS — H8110 Benign paroxysmal vertigo, unspecified ear: Secondary | ICD-10-CM | POA: Diagnosis not present

## 2016-12-19 DIAGNOSIS — R300 Dysuria: Secondary | ICD-10-CM | POA: Diagnosis not present

## 2016-12-19 DIAGNOSIS — M6281 Muscle weakness (generalized): Secondary | ICD-10-CM | POA: Diagnosis not present

## 2016-12-19 DIAGNOSIS — H8111 Benign paroxysmal vertigo, right ear: Secondary | ICD-10-CM | POA: Diagnosis not present

## 2016-12-19 DIAGNOSIS — N3941 Urge incontinence: Secondary | ICD-10-CM | POA: Diagnosis not present

## 2016-12-19 DIAGNOSIS — N3281 Overactive bladder: Secondary | ICD-10-CM | POA: Diagnosis not present

## 2016-12-19 DIAGNOSIS — R42 Dizziness and giddiness: Secondary | ICD-10-CM | POA: Diagnosis not present

## 2016-12-20 DIAGNOSIS — M7052 Other bursitis of knee, left knee: Secondary | ICD-10-CM | POA: Diagnosis not present

## 2016-12-20 DIAGNOSIS — Z96653 Presence of artificial knee joint, bilateral: Secondary | ICD-10-CM | POA: Diagnosis not present

## 2016-12-20 DIAGNOSIS — T8484XA Pain due to internal orthopedic prosthetic devices, implants and grafts, initial encounter: Secondary | ICD-10-CM | POA: Diagnosis not present

## 2016-12-20 DIAGNOSIS — T8484XS Pain due to internal orthopedic prosthetic devices, implants and grafts, sequela: Secondary | ICD-10-CM | POA: Diagnosis not present

## 2016-12-20 DIAGNOSIS — Z471 Aftercare following joint replacement surgery: Secondary | ICD-10-CM | POA: Diagnosis not present

## 2016-12-22 DIAGNOSIS — Z79899 Other long term (current) drug therapy: Secondary | ICD-10-CM | POA: Diagnosis not present

## 2016-12-22 DIAGNOSIS — H401132 Primary open-angle glaucoma, bilateral, moderate stage: Secondary | ICD-10-CM | POA: Diagnosis not present

## 2017-01-02 DIAGNOSIS — T1512XA Foreign body in conjunctival sac, left eye, initial encounter: Secondary | ICD-10-CM | POA: Diagnosis not present

## 2017-01-15 DIAGNOSIS — M62838 Other muscle spasm: Secondary | ICD-10-CM | POA: Diagnosis not present

## 2017-01-15 DIAGNOSIS — M6281 Muscle weakness (generalized): Secondary | ICD-10-CM | POA: Diagnosis not present

## 2017-01-15 DIAGNOSIS — N3281 Overactive bladder: Secondary | ICD-10-CM | POA: Diagnosis not present

## 2017-01-15 DIAGNOSIS — N3941 Urge incontinence: Secondary | ICD-10-CM | POA: Diagnosis not present

## 2017-01-15 DIAGNOSIS — R300 Dysuria: Secondary | ICD-10-CM | POA: Diagnosis not present

## 2017-01-19 ENCOUNTER — Telehealth (INDEPENDENT_AMBULATORY_CARE_PROVIDER_SITE_OTHER): Payer: Self-pay

## 2017-01-19 NOTE — Telephone Encounter (Signed)
Patient is requesting an injection for her back. Says its hurting on the left side again x 2 weeks. No leg pain. Had RFA July/aAug of last year. [employer]. Please advise on scheduling.

## 2017-01-19 NOTE — Telephone Encounter (Signed)
If just started two weeks ago then schedule OV with possible trigger point and then we can submet OV for repeat RFA if needed.

## 2017-01-22 NOTE — Telephone Encounter (Signed)
Scheduled for 8/21 at 1000.

## 2017-01-23 ENCOUNTER — Ambulatory Visit (INDEPENDENT_AMBULATORY_CARE_PROVIDER_SITE_OTHER): Payer: Medicare PPO | Admitting: Physical Medicine and Rehabilitation

## 2017-01-23 ENCOUNTER — Encounter (INDEPENDENT_AMBULATORY_CARE_PROVIDER_SITE_OTHER): Payer: Self-pay | Admitting: Physical Medicine and Rehabilitation

## 2017-01-23 VITALS — BP 138/68 | HR 69

## 2017-01-23 DIAGNOSIS — M545 Low back pain, unspecified: Secondary | ICD-10-CM

## 2017-01-23 DIAGNOSIS — M67952 Unspecified disorder of synovium and tendon, left thigh: Secondary | ICD-10-CM

## 2017-01-23 DIAGNOSIS — M47816 Spondylosis without myelopathy or radiculopathy, lumbar region: Secondary | ICD-10-CM

## 2017-01-23 DIAGNOSIS — M7062 Trochanteric bursitis, left hip: Secondary | ICD-10-CM | POA: Diagnosis not present

## 2017-01-23 DIAGNOSIS — G8929 Other chronic pain: Secondary | ICD-10-CM | POA: Diagnosis not present

## 2017-01-23 NOTE — Progress Notes (Signed)
Lower back pain worse on the left, but feels like it wraps around to the right sometimes. Says pain has never really gone away and only got a little bit better after her last procedure. She is going on a long bus trip this weekend and is hoping to get some relief before then. No leg pain.

## 2017-01-25 ENCOUNTER — Encounter (INDEPENDENT_AMBULATORY_CARE_PROVIDER_SITE_OTHER): Payer: Self-pay | Admitting: Physical Medicine and Rehabilitation

## 2017-01-25 DIAGNOSIS — H348322 Tributary (branch) retinal vein occlusion, left eye, stable: Secondary | ICD-10-CM | POA: Diagnosis not present

## 2017-01-25 DIAGNOSIS — H35371 Puckering of macula, right eye: Secondary | ICD-10-CM | POA: Diagnosis not present

## 2017-01-25 DIAGNOSIS — M47816 Spondylosis without myelopathy or radiculopathy, lumbar region: Secondary | ICD-10-CM | POA: Diagnosis not present

## 2017-01-25 DIAGNOSIS — M545 Low back pain: Secondary | ICD-10-CM | POA: Diagnosis not present

## 2017-01-25 DIAGNOSIS — G8929 Other chronic pain: Secondary | ICD-10-CM | POA: Diagnosis not present

## 2017-01-25 DIAGNOSIS — M7062 Trochanteric bursitis, left hip: Secondary | ICD-10-CM | POA: Diagnosis not present

## 2017-01-25 DIAGNOSIS — M67952 Unspecified disorder of synovium and tendon, left thigh: Secondary | ICD-10-CM | POA: Diagnosis not present

## 2017-01-25 MED ORDER — TRIAMCINOLONE ACETONIDE 40 MG/ML IJ SUSP
80.0000 mg | INTRAMUSCULAR | Status: AC | PRN
  Administered 2017-01-25: 80 mg via INTRA_ARTICULAR

## 2017-01-25 MED ORDER — LIDOCAINE HCL 2 % IJ SOLN
4.0000 mL | INTRAMUSCULAR | Status: AC | PRN
  Administered 2017-01-25: 4 mL

## 2017-01-25 MED ORDER — BUPIVACAINE HCL 0.25 % IJ SOLN
4.0000 mL | INTRAMUSCULAR | Status: AC | PRN
  Administered 2017-01-25: 4 mL via INTRA_ARTICULAR

## 2017-01-25 NOTE — Progress Notes (Signed)
INDYA OLIVERIA - 76 y.o. female MRN [id number]  Date of birth: [birth year]  Office Visit Note: Visit Date: 01/23/2017 PCP: Seward Carol, MD Referred by: Seward Carol, MD  Subjective: Chief Complaint  Patient presents with  . Lower Back - Pain   HPI: Mrs. Hibner is a 76 year old female that we know quite well from a few years now with chronic axial low back pain worse with standing and ambulating. She gets relief with sitting. She tries to stay active however so it is a problem. The last time I saw her was in July of last year we completed radiofrequency ablation of the L3-4 and L4-5 facet joints. She said she had pretty good relief for a while but didn't get complete relief. She would really hope to get better relief but this hasn't happened. 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 She has some degenerative changes at L5-S1 with right lateral protrusion. Her pain today is really axial low back with pain to the left buttock and gluteus medius region wrapping around to the greater trochanter. She reports a lot of pain with motion and laying on that side. She reports a lot of back pain as well. She's had no new trauma. No focal weakness. No radicular complaints down the leg. She does get pain referral into the hips. No groin pain. 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 She continues to take Tylenol has not been tolerant of movement other medications. She does use some tramadol. She states that really limits what she can do. She has a bus trip planned for this weekend. This is part of the [place] [employer] football program.    Review of Systems  Musculoskeletal: Positive for back pain and joint pain.   Otherwise per HPI.  Assessment & Plan: Visit Diagnoses:  1. Greater trochanteric bursitis, left   2. Tendinopathy of left gluteus medius   3. Spondylosis  without myelopathy or radiculopathy, lumbar region   4. Chronic bilateral low back pain without sciatica     Plan: Findings:  Chronic worsening severe axial low back pain now with more left buttock and left lateral hip pain over the greater trochanter. I think this is partly gluteus medius tendinitis and greater trochanteric bursitis but also probably return of symptoms from her severe arthritis. MRI is fairly recent. She could be getting some type of a radicular pain from L5 into the buttock region but right now she is tender over the spinous I think this is more localized inflammation. Within a complete a diagnostic and hopefully therapeutic gluteus medius tendon injection and greater trochanteric bursa injection. If she does well we'll see her back as needed. If it helps to a degree but she still having a lot of back pain I think we still need to repeat the radiofrequency ablation has been over a year. Unfortunately her options are fairly limited after that but would include weight loss and therapy which he continues to do and has done. She would be a good candidate for spinal cord stimulator trial. She doesn't really have any nerve compression on the recent MRIs some not sure surgeries the right answer although obviously a lumbar fusion could be considered. I'm not sure she would do well with that.    Meds & Orders: No orders of the defined types were placed in this encounter.   Orders Placed This Encounter  Procedures  . Large Joint Injection/Arthrocentesis    Follow-up: Return if symptoms worsen or fail to improve, for Consideration of repeat radiofrequency ablation.   Procedures: Left greater trochanter injection and left gluteus medius tendon injection Date/Time: 01/25/2017 6:00 AM Performed by: Magnus Sinning Authorized by: Magnus Sinning   Consent Given by:  Parent Site marked: the procedure site was marked   Timeout: prior to procedure the correct patient, procedure, and site was  verified   Indications:  Pain and diagnostic evaluation Location:  Hip Site:  L greater trochanter (Left gluteus medius tendon) Prep: patient was prepped and draped in usual sterile fashion   Needle Size:  22 G Needle Length:  3.5 inches Approach:  Lateral Ultrasound Guidance: No   Fluoroscopic Guidance: No   Arthrogram: No   Medications:  80 mg triamcinolone acetonide 40 MG/ML; 4 mL lidocaine 2 %; 4 mL bupivacaine 0.25 % Aspiration Attempted: No   Patient tolerance:  Patient tolerated the procedure well with no immediate complications  Greatest area of pain over the greater trochanter was palpated and marked prior to injection. The patient did seem to have relief after the injection. Gluteus medius tendon was palpated as well a peritendinitis injection was completed.    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 Lateral recess narrowing right more than left at L4-5. Far right lateral protrusion at L5-S1. No significant central canal stenosis.  She reports that she quit smoking about 21 years ago. She has never used smokeless tobacco. No results for input(s): HGBA1C, LABURIC in the last 8760 hours.  Objective:  VS:  HT:    WT:   BMI:     BP:138/68  HR:69bpm  TEMP: ( )  RESP:  Physical Exam  Constitutional: She is oriented to person, place, and time. She appears well-developed and well-nourished.  Eyes: Pupils are equal, round, and reactive to light. Conjunctivae and EOM are normal.  Cardiovascular: Normal rate and intact distal pulses.   Pulmonary/Chest: Effort normal.  Musculoskeletal:  Patient has difficulty going from sit to stand. She does have pain with extension of the lumbar spine. She has tenderness to palpation of the gluteus medius tendon as well as the greater trochanter on the left but not the right. She has no pain with hip rotation. She has good distal  strength.  Neurological: She is alert and oriented to person, place, and time. She exhibits normal muscle tone.  Skin: Skin is warm and dry. No rash noted. No erythema.  Psychiatric: She has a normal mood and affect. Her behavior is normal.  Nursing note and vitals reviewed.   Ortho Exam Imaging: No results found.  Past Medical/Family/Surgical/Social History: Medications & Allergies reviewed per EMR There are no active problems to display for this patient.  Past Medical History:  Diagnosis Date  . High cholesterol   . Hypertension   . Thyroid disease    History reviewed. No pertinent family history. Past Surgical History:  Procedure Laterality Date  . JOINT REPLACEMENT     Social History   Occupational History  . Not on file.   Social History Main Topics  . Smoking status: Former Smoker    Quit date: 1997  . Smokeless tobacco: Never Used  . Alcohol use 0.6 oz/week    1 Glasses of wine per week     Comment: occ wine  . Drug use: No  .  Sexual activity: Not on file

## 2017-02-08 DIAGNOSIS — N3941 Urge incontinence: Secondary | ICD-10-CM | POA: Diagnosis not present

## 2017-02-08 DIAGNOSIS — M6281 Muscle weakness (generalized): Secondary | ICD-10-CM | POA: Diagnosis not present

## 2017-02-08 DIAGNOSIS — M62838 Other muscle spasm: Secondary | ICD-10-CM | POA: Diagnosis not present

## 2017-02-08 DIAGNOSIS — R300 Dysuria: Secondary | ICD-10-CM | POA: Diagnosis not present

## 2017-02-20 DIAGNOSIS — N3281 Overactive bladder: Secondary | ICD-10-CM | POA: Diagnosis not present

## 2017-02-23 DIAGNOSIS — E7800 Pure hypercholesterolemia, unspecified: Secondary | ICD-10-CM | POA: Diagnosis not present

## 2017-02-23 DIAGNOSIS — E039 Hypothyroidism, unspecified: Secondary | ICD-10-CM | POA: Diagnosis not present

## 2017-02-23 DIAGNOSIS — Z23 Encounter for immunization: Secondary | ICD-10-CM | POA: Diagnosis not present

## 2017-02-23 DIAGNOSIS — Z6830 Body mass index (BMI) 30.0-30.9, adult: Secondary | ICD-10-CM | POA: Diagnosis not present

## 2017-02-23 DIAGNOSIS — I10 Essential (primary) hypertension: Secondary | ICD-10-CM | POA: Diagnosis not present

## 2017-02-23 DIAGNOSIS — E663 Overweight: Secondary | ICD-10-CM | POA: Diagnosis not present

## 2017-03-02 DIAGNOSIS — Z780 Asymptomatic menopausal state: Secondary | ICD-10-CM | POA: Diagnosis not present

## 2017-03-02 DIAGNOSIS — Z1231 Encounter for screening mammogram for malignant neoplasm of breast: Secondary | ICD-10-CM | POA: Diagnosis not present

## 2017-03-05 DIAGNOSIS — N3941 Urge incontinence: Secondary | ICD-10-CM | POA: Diagnosis not present

## 2017-03-05 DIAGNOSIS — M62838 Other muscle spasm: Secondary | ICD-10-CM | POA: Diagnosis not present

## 2017-03-05 DIAGNOSIS — M6281 Muscle weakness (generalized): Secondary | ICD-10-CM | POA: Diagnosis not present

## 2017-03-05 DIAGNOSIS — R300 Dysuria: Secondary | ICD-10-CM | POA: Diagnosis not present

## 2017-03-05 DIAGNOSIS — N3281 Overactive bladder: Secondary | ICD-10-CM | POA: Diagnosis not present

## 2017-04-13 ENCOUNTER — Encounter (INDEPENDENT_AMBULATORY_CARE_PROVIDER_SITE_OTHER): Payer: Self-pay | Admitting: Orthopedic Surgery

## 2017-04-13 ENCOUNTER — Ambulatory Visit (INDEPENDENT_AMBULATORY_CARE_PROVIDER_SITE_OTHER): Payer: Medicare PPO

## 2017-04-13 ENCOUNTER — Ambulatory Visit (INDEPENDENT_AMBULATORY_CARE_PROVIDER_SITE_OTHER): Payer: Medicare PPO | Admitting: Orthopedic Surgery

## 2017-04-13 DIAGNOSIS — M25552 Pain in left hip: Secondary | ICD-10-CM

## 2017-04-13 DIAGNOSIS — M79671 Pain in right foot: Secondary | ICD-10-CM | POA: Diagnosis not present

## 2017-04-13 DIAGNOSIS — M542 Cervicalgia: Secondary | ICD-10-CM

## 2017-04-13 DIAGNOSIS — M1811 Unilateral primary osteoarthritis of first carpometacarpal joint, right hand: Secondary | ICD-10-CM

## 2017-04-15 DIAGNOSIS — M1811 Unilateral primary osteoarthritis of first carpometacarpal joint, right hand: Secondary | ICD-10-CM

## 2017-04-15 MED ORDER — LIDOCAINE HCL 1 % IJ SOLN
3.0000 mL | INTRAMUSCULAR | Status: AC | PRN
  Administered 2017-04-15: 3 mL

## 2017-04-15 MED ORDER — BUPIVACAINE HCL 0.25 % IJ SOLN
0.3300 mL | INTRAMUSCULAR | Status: AC | PRN
  Administered 2017-04-15: .33 mL via INTRA_ARTICULAR

## 2017-04-15 MED ORDER — METHYLPREDNISOLONE ACETATE 40 MG/ML IJ SUSP
13.3300 mg | INTRAMUSCULAR | Status: AC | PRN
  Administered 2017-04-15: 13.33 mg via INTRA_ARTICULAR

## 2017-04-15 NOTE — Progress Notes (Addendum)
Office Visit Note   Patient: Barbara Patton           Date of Birth: 08-04-1940           MRN: [id number] Visit Date: 04/13/2017 Requested by: Seward Carol, MD [address] [place], [place] [address] PCP: Seward Carol, MD  Subjective: Chief Complaint  Patient presents with  . Right Hand - Pain  . Lower Back - Pain    HPI: Barbara Patton is a patient who says "I have a lot of issues".  Patient describes right thumb pain.  She has known history of CMC arthritis.  Reports tenderness to touch in that area.  She is right-hand dominant.  Previous injections have given her some relief.  [name] also reports chronic low back pain.  She has known degenerative changes in the lumbar spine.  Patient denies any new radiculopathy or weakness.  Patient also reports left groin and trochanteric pain.  She also describes shoulder blade pain radiating into the neck.  She takes tramadol and Tylenol.  She did have a trochanteric bursa shot on the left which has helped her.  MRI scan of the right ankle from 2017 was normal.  Today she is localizing right foot pains around the base of the fifth metatarsal..  Patient wants no surgery.              ROS: All systems reviewed are negative as they relate to the chief complaint within the history of present illness.  Patient denies  fevers or chills.   Assessment & Plan: Visit Diagnoses:  1. Pain in right foot   2. Pain in left hip   3. Neck pain     Plan: Impression is right thumb CMC arthritis.  Fluoroscopic injection performed today.  In regards to the low back pain she has to decide if this is bad enough for her to consider surgical intervention.  Since she doesn't use drugs just going to have to try to live with this as best she can.  In regards to the left groin pain she may have a component of trochanteric bursitis as well as osteoarthritis.  I'll like for her to go to Dr. Ernestina Patches for both left hip joint injection and trochanteric bursa  injection right on the lateral aspect of the trochanter.  In regards to her shoulder blade pain she has known history of cervical degenerative disc disease.  On a different day with Dr. noon she'll have to have C-spine ESI which she has had in the past.  I will see her back as needed.  Follow-Up Instructions: Return if symptoms worsen or fail to improve.   Orders:  Orders Placed This Encounter  Procedures  . XR Foot Complete Right  . Ambulatory referral to Physical Medicine Rehab   No orders of the defined types were placed in this encounter.     Procedures: Small Joint Inj: R thumb CMC on 04/15/2017 7:50 PM Indications: pain Details: 25 G needle, fluoroscopy-guided radial approach  Spinal Needle: No  Medications: 3 mL lidocaine 1 %; 0.33 mL bupivacaine 0.25 %; 13.33 mg methylPREDNISolone acetate 40 MG/ML Outcome: tolerated well, no immediate complications Procedure, treatment alternatives, risks and benefits explained, specific risks discussed. Consent was given by the patient. Immediately prior to procedure a time out was called to verify the correct patient, procedure, equipment, support staff and site/side marked as required. Patient was prepped and draped in the usual sterile fashion.  Clinical Data: No additional findings.  Objective: Vital Signs: There were no vitals taken for this visit.  Physical Exam:   Constitutional: Patient appears well-developed HEENT:  Head: Normocephalic Eyes:EOM are normal Neck: Normal range of motion Cardiovascular: Normal rate Pulmonary/chest: Effort normal Neurologic: Patient is alert Skin: Skin is warm Psychiatric: Patient has normal mood and affect    Ortho Exam: Orthopedic exam demonstrates full active and passive range of motion of the foot and ankle.  She has some tenderness around the base of the fifth metatarsal.  No swelling is present.  No pain with pronation supination of the forefoot.  No other masses lymph  adenopathy or skin changes noted in the foot and ankle region.  Pedal pulses palpable.  Patient does have some referred pain to the right shoulder blade with rotation to the right.  Motor sensory function to the hands and arms otherwise intact with no muscle atrophy and good strength to EPL FPL interosseous wrist flexion and wrist tension biceps triceps and deltoid strength testing.  Patient has no nerve retention signs but some pain with forward lateral bending.  Has good ankle dorsi flexion plantar flexion quite hamstring strength with no muscle atrophy or paresthesias present.  Patient has trochanteric tenderness on the left but not the right.  Also little bit of restricted internal rotation on the left compared to the right.  Hip flexion strength intact and symmetric.  Specialty Comments:  No specialty comments available.  Imaging: No results found.   PMFS History: There are no active problems to display for this patient.  Past Medical History:  Diagnosis Date  . High cholesterol   . Hypertension   . Thyroid disease     History reviewed. No pertinent family history.  Past Surgical History:  Procedure Laterality Date  . JOINT REPLACEMENT     Social History   Occupational History  . Not on file  Tobacco Use  . Smoking status: Former Smoker    Last attempt to quit: 1997    Years since quitting: 21.8  . Smokeless tobacco: Never Used  Substance and Sexual Activity  . Alcohol use: Yes    Alcohol/week: 0.6 oz    Types: 1 Glasses of wine per week    Comment: occ wine  . Drug use: No  . Sexual activity: Not on file

## 2017-04-20 ENCOUNTER — Ambulatory Visit (INDEPENDENT_AMBULATORY_CARE_PROVIDER_SITE_OTHER): Payer: Medicare PPO

## 2017-04-20 ENCOUNTER — Encounter (INDEPENDENT_AMBULATORY_CARE_PROVIDER_SITE_OTHER): Payer: Self-pay | Admitting: Physical Medicine and Rehabilitation

## 2017-04-20 ENCOUNTER — Ambulatory Visit (INDEPENDENT_AMBULATORY_CARE_PROVIDER_SITE_OTHER): Payer: Medicare PPO | Admitting: Physical Medicine and Rehabilitation

## 2017-04-20 DIAGNOSIS — M25552 Pain in left hip: Secondary | ICD-10-CM | POA: Diagnosis not present

## 2017-04-20 DIAGNOSIS — M7062 Trochanteric bursitis, left hip: Secondary | ICD-10-CM

## 2017-04-20 NOTE — Patient Instructions (Signed)

## 2017-04-20 NOTE — Progress Notes (Deleted)
Patient presents with left hip and leg pain. She had a left greater troch injection and left gluteus medius tendon injection on 01/25/2017 which provided almost pain free relief x 2 months. The same pain has been coming back for about a month and a half. She is also now experiencing left groin pain x 2 months. She has not had an injury.  She takes tramadol for pain daily, but this is not helping her groin pain.

## 2017-04-20 NOTE — Progress Notes (Signed)
Barbara Patton - 76 y.o. female MRN [id number]  Date of birth: 01/02/1941  Office Visit Note: Visit Date: 04/20/2017 PCP: Seward Carol, MD Referred by: Seward Carol, MD  Subjective: Chief Complaint  Patient presents with  . Left Hip - Pain   HPI: Barbara Patton is a 76 year old female occasions for complaints of left hip pain and trochanteric bursitis.  She recently saw Dr. Marlou Sa who request diagnostic and hopefully therapeutic intra-articular hip joint injection with fluoroscopic guidance as well as greater trochanteric injection with fluoroscopic guidance.  Fluoroscopic guidance for the bursa is due to body habitus.  She is having groin pain as well as lateral hip pain.  Pain is worse with movement and standing.  She is failed conservative care otherwise including nonsteroidal anti-inflammatories as well as muscle relaxers and physical therapy and injections in the past.  Injections in the past have helped.    ROS Otherwise per HPI.  Assessment & Plan: Visit Diagnoses:  1. Pain in left hip   2. Greater trochanteric bursitis, left     Plan: Findings:  Fluoroscopically guided intra-articular left  hip injection as well as greater trochanteric injection.  Patient did seem to have some relief during the intra-articular portion of the injection.    Meds & Orders: No orders of the defined types were placed in this encounter.  No orders of the defined types were placed in this encounter.   Follow-up: No Follow-up on file.   Procedures: Large Joint Inj: L hip joint on 04/20/2017 10:36 AM Indications: diagnostic evaluation and pain Details: 22 G 3.5 in needle, fluoroscopy-guided anterior approach  Arthrogram: No  Medications: 80 mg triamcinolone acetonide 40 MG/ML; 3 mL bupivacaine 0.5 % Outcome: tolerated well, no immediate complications  There was excellent flow of contrast producing a partial arthrogram of the hip. The patient did have relief of symptoms during the anesthetic  phase of the injection. Procedure, treatment alternatives, risks and benefits explained, specific risks discussed. Consent was given by the patient. Immediately prior to procedure a time out was called to verify the correct patient, procedure, equipment, support staff and site/side marked as required. Patient was prepped and draped in the usual sterile fashion.   Large Joint Inj: L greater trochanter on 04/20/2017 10:36 AM Indications: pain and diagnostic evaluation Details: 22 G 3.5 in needle, fluoroscopy-guided lateral approach  Arthrogram: No  Medications: 4 mL lidocaine 2 %; 4 mL bupivacaine 0.25 %; 40 mg triamcinolone acetonide 40 MG/ML Outcome: tolerated well, no immediate complications  There was excellent flow of contrast outlined the greater trochanteric bursa without vascular uptake. Procedure, treatment alternatives, risks and benefits explained, specific risks discussed. Consent was given by the patient. Immediately prior to procedure a time out was called to verify the correct patient, procedure, equipment, support staff and site/side marked as required. Patient was prepped and draped in the usual sterile fashion.      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 Lateral recess narrowing right more than left at L4-5. Far right lateral protrusion at L5-S1. No significant central canal stenosis.  She reports that she quit smoking about 21 years ago. she has never used smokeless tobacco. No results for input(s): HGBA1C, LABURIC in the last 8760 hours.  Objective:  VS:  HT:    WT:   BMI:     BP:   HR: bpm  TEMP: ( )  RESP:  Physical Exam  Musculoskeletal:  Patient ambulates with an antalgic gait to the right.  She does have some pain and tenderness over the greater trochanter.  Some pain at end ranges of rotation of the hip.    Ortho Exam Imaging: No results  found.  Past Medical/Family/Surgical/Social History: Medications & Allergies reviewed per EMR There are no active problems to display for this patient.  Past Medical History:  Diagnosis Date  . High cholesterol   . Hypertension   . Thyroid disease    No family history on file. Past Surgical History:  Procedure Laterality Date  . JOINT REPLACEMENT     Social History   Occupational History  . Not on file  Tobacco Use  . Smoking status: Former Smoker    Last attempt to quit: 1997    Years since quitting: 21.8  . Smokeless tobacco: Never Used  Substance and Sexual Activity  . Alcohol use: Yes    Alcohol/week: 0.6 oz    Types: 1 Glasses of wine per week    Comment: occ wine  . Drug use: No  . Sexual activity: Not on file

## 2017-04-23 ENCOUNTER — Telehealth (INDEPENDENT_AMBULATORY_CARE_PROVIDER_SITE_OTHER): Payer: Self-pay | Admitting: Orthopedic Surgery

## 2017-04-23 NOTE — Telephone Encounter (Signed)
Patient left a message stating that she had some questions for Dr. Marlou Sa.  CB#[phone].  Thank you.

## 2017-04-23 NOTE — Telephone Encounter (Signed)
IC sw patient. She stated that she has hx of TKA. Doctor who did her surgery she is no longer seeing. Wants to know if you recommend pre med for dental procedure. She said her dentist said it was not required. Please advise.  Allergy to Codeine and PCN-dental procedure on Wednesday [facility] [address]

## 2017-04-24 MED ORDER — CLINDAMYCIN HCL 300 MG PO CAPS: ORAL_CAPSULE | ORAL | 0 refills | Status: DC

## 2017-04-24 NOTE — Telephone Encounter (Signed)
I would if it were me 2 g po 1 hour before amox

## 2017-04-24 NOTE — Telephone Encounter (Signed)
Patient advised. rx sent to pharmacy. Changed to clindamycin due to patient PCN allergy.

## 2017-05-04 MED ORDER — BUPIVACAINE HCL 0.25 % IJ SOLN
4.0000 mL | INTRAMUSCULAR | Status: AC | PRN
  Administered 2017-04-20: 4 mL via INTRA_ARTICULAR

## 2017-05-04 MED ORDER — BUPIVACAINE HCL 0.5 % IJ SOLN
3.0000 mL | INTRAMUSCULAR | Status: AC | PRN
  Administered 2017-04-20: 3 mL via INTRA_ARTICULAR

## 2017-05-04 MED ORDER — TRIAMCINOLONE ACETONIDE 40 MG/ML IJ SUSP
40.0000 mg | INTRAMUSCULAR | Status: AC | PRN
  Administered 2017-04-20: 40 mg via INTRA_ARTICULAR

## 2017-05-04 MED ORDER — TRIAMCINOLONE ACETONIDE 40 MG/ML IJ SUSP
80.0000 mg | INTRAMUSCULAR | Status: AC | PRN
  Administered 2017-04-20: 80 mg via INTRA_ARTICULAR

## 2017-05-04 MED ORDER — LIDOCAINE HCL 2 % IJ SOLN
4.0000 mL | INTRAMUSCULAR | Status: AC | PRN
  Administered 2017-04-20: 4 mL

## 2017-06-01 DIAGNOSIS — M329 Systemic lupus erythematosus, unspecified: Secondary | ICD-10-CM | POA: Diagnosis not present

## 2017-06-01 DIAGNOSIS — E559 Vitamin D deficiency, unspecified: Secondary | ICD-10-CM | POA: Diagnosis not present

## 2017-06-01 DIAGNOSIS — M5136 Other intervertebral disc degeneration, lumbar region: Secondary | ICD-10-CM | POA: Diagnosis not present

## 2017-06-01 DIAGNOSIS — E669 Obesity, unspecified: Secondary | ICD-10-CM | POA: Diagnosis not present

## 2017-06-01 DIAGNOSIS — H2011 Chronic iridocyclitis, right eye: Secondary | ICD-10-CM | POA: Diagnosis not present

## 2017-06-01 DIAGNOSIS — M797 Fibromyalgia: Secondary | ICD-10-CM | POA: Diagnosis not present

## 2017-06-01 DIAGNOSIS — M150 Primary generalized (osteo)arthritis: Secondary | ICD-10-CM | POA: Diagnosis not present

## 2017-06-01 DIAGNOSIS — Z6830 Body mass index (BMI) 30.0-30.9, adult: Secondary | ICD-10-CM | POA: Diagnosis not present

## 2017-06-01 DIAGNOSIS — R5383 Other fatigue: Secondary | ICD-10-CM | POA: Diagnosis not present

## 2017-06-14 ENCOUNTER — Telehealth (INDEPENDENT_AMBULATORY_CARE_PROVIDER_SITE_OTHER): Payer: Self-pay | Admitting: Physical Medicine and Rehabilitation

## 2017-06-14 DIAGNOSIS — H348322 Tributary (branch) retinal vein occlusion, left eye, stable: Secondary | ICD-10-CM | POA: Diagnosis not present

## 2017-06-14 DIAGNOSIS — H43813 Vitreous degeneration, bilateral: Secondary | ICD-10-CM | POA: Diagnosis not present

## 2017-06-14 DIAGNOSIS — H35371 Puckering of macula, right eye: Secondary | ICD-10-CM | POA: Diagnosis not present

## 2017-06-14 NOTE — Telephone Encounter (Signed)
Really think she needs to see Dr. Marlou Sa and figure out if something to be done so it does not recur so fast. Did injections help a lot? And how long

## 2017-06-15 NOTE — Telephone Encounter (Signed)
Left message advising patient to follow up with Dr. Marlou Sa first.

## 2017-06-22 DIAGNOSIS — H2512 Age-related nuclear cataract, left eye: Secondary | ICD-10-CM | POA: Diagnosis not present

## 2017-06-22 DIAGNOSIS — H401133 Primary open-angle glaucoma, bilateral, severe stage: Secondary | ICD-10-CM | POA: Diagnosis not present

## 2017-07-04 DIAGNOSIS — E785 Hyperlipidemia, unspecified: Secondary | ICD-10-CM | POA: Diagnosis not present

## 2017-07-04 DIAGNOSIS — E039 Hypothyroidism, unspecified: Secondary | ICD-10-CM | POA: Diagnosis not present

## 2017-07-04 DIAGNOSIS — I10 Essential (primary) hypertension: Secondary | ICD-10-CM | POA: Diagnosis not present

## 2017-07-06 ENCOUNTER — Encounter (INDEPENDENT_AMBULATORY_CARE_PROVIDER_SITE_OTHER): Payer: Self-pay | Admitting: Orthopedic Surgery

## 2017-07-06 ENCOUNTER — Ambulatory Visit (INDEPENDENT_AMBULATORY_CARE_PROVIDER_SITE_OTHER): Payer: PRIVATE HEALTH INSURANCE | Admitting: Orthopedic Surgery

## 2017-07-06 ENCOUNTER — Ambulatory Visit (INDEPENDENT_AMBULATORY_CARE_PROVIDER_SITE_OTHER): Payer: PRIVATE HEALTH INSURANCE

## 2017-07-06 ENCOUNTER — Ambulatory Visit (INDEPENDENT_AMBULATORY_CARE_PROVIDER_SITE_OTHER): Payer: Medicare PPO

## 2017-07-06 DIAGNOSIS — M25562 Pain in left knee: Secondary | ICD-10-CM | POA: Diagnosis not present

## 2017-07-06 DIAGNOSIS — M25552 Pain in left hip: Secondary | ICD-10-CM

## 2017-07-06 DIAGNOSIS — G8929 Other chronic pain: Secondary | ICD-10-CM | POA: Diagnosis not present

## 2017-07-06 NOTE — Progress Notes (Signed)
Office Visit Note   Patient: Barbara Patton           Date of Birth: 1940/10/20           MRN: [id number] Visit Date: 07/06/2017 Requested by: Seward Carol, MD [address] [place], [place] [address] PCP: Seward Carol, MD  Subjective: Chief Complaint  Patient presents with  . multiple joint pain    HPI: Barbara Patton presents with multiple orthopedic complaints today.  "I hurt everywhere".  She wants some suggestions on how to handle the pain.  She does report bilateral groin pain left worse than right.  Review of the records shows that she had both a trochanteric bursa injection as well as an intra-articular hip injection late last year with Dr. Ernestina Patches.  The hip injection did give her some temporary relief.  2 she is having continued groin pain with ambulation.  She also reports right hand pain and has a known history of CMC arthritis.  She has had injections here in the past.  She also describes left knee pain.  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  She is not having any fevers or chills.  She also reports right ankle pain.  She has had MRI of that right ankle which shows mild degenerative findings but no significant operative pathology.  She takes tramadol and Tylenol for her symptoms.  She is also reporting some shoulder pain on the right-hand side along with neck pain.  She has known history of arthritis in the neck.  MRI scan on the neck in 2017 did show some bulging disc.  Muscle relaxers help her back.  She also has fibromyalgia and lupus.              ROS: All systems reviewed are negative as they relate to the chief complaint within the history of present illness.  Patient denies  fevers or chills.   Assessment & Plan: Visit Diagnoses:  1. Pain in left hip   2. Chronic pain of left knee     Plan: Impression is bilateral groin pain left worse than right which is likely consistent with early arthritis.  Radiographs  today do not show bone-on-bone changes but she does have a little bit of wear and tear.  In regards to the hand she has T[place] arthritis and she needs to be careful with multiple injections into that joint.  I would favor waiting a little bit later in the year before injecting that [facility] joint again.  In general as a rule she wants to avoid surgical intervention at all costs.  The left shoulder has some crepitus but the right shoulder range of motion feels relatively smooth.  Neck range of motion also intact.  Motor sensory function to the shoulder intact.  I think this is something that we can watch.  She has had multiple injections in each shoulder in the past.  I think she may have a right neck pathology which is radiating down into that trapezial region on the right-hand side.  This is not bad enough for her to get neck injections so we will hold off on any further MRI scanning.  The left knee has been revised and there is no evidence of infection or loosening.  She has also back problems long-standing which could be contributing to this.  I am going to see her back as needed.  No definite intervention required today.  If she runs out  of muscle relaxer we can refill that.  I would not favor narcotic medication in this case.  She is not requesting any narcotic medicine.  Follow-Up Instructions: No Follow-up on file.   Orders:  Orders Placed This Encounter  Procedures  . XR HIP UNILAT W OR W/O PELVIS 2-3 VIEWS LEFT  . XR Knee 1-2 Views Left   No orders of the defined types were placed in this encounter.     Procedures: No procedures performed   Clinical Data: No additional findings.  Objective: Vital Signs: There were no vitals taken for this visit.  Physical Exam:   Constitutional: Patient appears well-developed HEENT:  Head: Normocephalic Eyes:EOM are normal Neck: Normal range of motion Cardiovascular: Normal rate Pulmonary/chest: Effort normal Neurologic: Patient is alert Skin: Skin  is warm Psychiatric: Patient has normal mood and affect    Ortho Exam: Orthopedic exam demonstrates pretty reasonable cervical spine range of motion.  Right shoulder has good passive range of motion with abduction external rotation.  Rotator cuff strength also fairly reasonable on the right-hand side.  No discrete AC joint tenderness is noted on the right.  No masses lymph adenopathy or skin changes noted in the shoulder girdle region.  The right hand demonstrates some coarse CMC grinding with loading.  EPL FPL is intact.  Radial pulses intact.  No other masses lymph adenopathy or skin changes noted in that hand region.  Patient has mild groin pain on the left more so than the right.  No real trochanteric tenderness today.  Left knee has no effusion no warmth good extension and flexion past 90.  Pedal pulses are palpable.  Specialty Comments:  No specialty comments available.  Imaging: Xr Hip Unilat W Or W/o Pelvis 2-3 Views Left  Result Date: 07/06/2017 AP pelvis lateral left hip reviewed.  Mild pain degenerative changes noted in the left hip compared to the right.  Enthesopathic changes on the ischial tuberosities.  Lower lumbar spurring present.  Xr Knee 1-2 Views Left  Result Date: 07/06/2017 AP lateral left knee reviewed.  Revision knee prosthesis in good position and alignment with no evidence of loosening or hardware complications.  Right total knee prosthesis in slight varus alignment.  No complicating features    PMFS History: There are no active problems to display for this patient.  Past Medical History:  Diagnosis Date  . High cholesterol   . Hypertension   . Thyroid disease     History reviewed. No pertinent family history.  Past Surgical History:  Procedure Laterality Date  . JOINT REPLACEMENT     Social History   Occupational History  . Not on file  Tobacco Use  . Smoking status: Former Smoker    Last attempt to quit: 1997    Years since quitting: 22.0  .  Smokeless tobacco: Never Used  Substance and Sexual Activity  . Alcohol use: Yes    Alcohol/week: 0.6 oz    Types: 1 Glasses of wine per week    Comment: occ wine  . Drug use: No  . Sexual activity: Not on file

## 2017-07-18 ENCOUNTER — Telehealth (INDEPENDENT_AMBULATORY_CARE_PROVIDER_SITE_OTHER): Payer: Self-pay | Admitting: Orthopedic Surgery

## 2017-07-18 DIAGNOSIS — M25552 Pain in left hip: Principal | ICD-10-CM

## 2017-07-18 DIAGNOSIS — M25551 Pain in right hip: Secondary | ICD-10-CM

## 2017-07-18 NOTE — Telephone Encounter (Signed)
Mri pelvis next step pls clal htx

## 2017-07-18 NOTE — Telephone Encounter (Signed)
Please advise. Does she need ROV?

## 2017-07-18 NOTE — Telephone Encounter (Signed)
Patient was told by Dr. Marlou Sa to let him know if the pain in her groin area got worse and she said it has and that its almost been unbearable today. Shes not sure what he wants her to do or what the next steps are. Please advise [phone]

## 2017-07-18 NOTE — Telephone Encounter (Signed)
Order put in for this. IC s/w patient and advise.

## 2017-07-24 ENCOUNTER — Telehealth (INDEPENDENT_AMBULATORY_CARE_PROVIDER_SITE_OTHER): Payer: Self-pay | Admitting: Radiology

## 2017-07-24 NOTE — Telephone Encounter (Signed)
Left voicemail for patient to call and schedule MRI review appointment with Dr. Marlou Sa after 07/28/2017

## 2017-07-26 DIAGNOSIS — E785 Hyperlipidemia, unspecified: Secondary | ICD-10-CM | POA: Diagnosis not present

## 2017-07-26 DIAGNOSIS — E039 Hypothyroidism, unspecified: Secondary | ICD-10-CM | POA: Diagnosis not present

## 2017-07-26 DIAGNOSIS — M19012 Primary osteoarthritis, left shoulder: Secondary | ICD-10-CM | POA: Diagnosis not present

## 2017-07-26 DIAGNOSIS — L299 Pruritus, unspecified: Secondary | ICD-10-CM | POA: Diagnosis not present

## 2017-07-26 DIAGNOSIS — M19011 Primary osteoarthritis, right shoulder: Secondary | ICD-10-CM | POA: Diagnosis not present

## 2017-07-26 DIAGNOSIS — I10 Essential (primary) hypertension: Secondary | ICD-10-CM | POA: Diagnosis not present

## 2017-07-26 DIAGNOSIS — M19041 Primary osteoarthritis, right hand: Secondary | ICD-10-CM | POA: Diagnosis not present

## 2017-07-26 DIAGNOSIS — E669 Obesity, unspecified: Secondary | ICD-10-CM | POA: Diagnosis not present

## 2017-07-26 DIAGNOSIS — K219 Gastro-esophageal reflux disease without esophagitis: Secondary | ICD-10-CM | POA: Diagnosis not present

## 2017-07-28 ENCOUNTER — Ambulatory Visit
Admission: RE | Admit: 2017-07-28 | Discharge: 2017-07-28 | Disposition: A | Discharge status: Home / Self Care | Payer: Medicare PPO | Source: Ambulatory Visit | Attending: Orthopedic Surgery | Admitting: Orthopedic Surgery

## 2017-07-28 DIAGNOSIS — M25552 Pain in left hip: Principal | ICD-10-CM

## 2017-07-28 DIAGNOSIS — M160 Bilateral primary osteoarthritis of hip: Secondary | ICD-10-CM | POA: Diagnosis not present

## 2017-07-28 DIAGNOSIS — M25551 Pain in right hip: Secondary | ICD-10-CM

## 2017-07-31 ENCOUNTER — Other Ambulatory Visit: Payer: Medicare PPO

## 2017-08-01 ENCOUNTER — Encounter (INDEPENDENT_AMBULATORY_CARE_PROVIDER_SITE_OTHER): Payer: Self-pay | Admitting: Orthopedic Surgery

## 2017-08-01 ENCOUNTER — Ambulatory Visit (INDEPENDENT_AMBULATORY_CARE_PROVIDER_SITE_OTHER): Payer: Medicare PPO | Admitting: Orthopedic Surgery

## 2017-08-01 DIAGNOSIS — M25552 Pain in left hip: Secondary | ICD-10-CM

## 2017-08-01 NOTE — Progress Notes (Signed)
Office Visit Note   Patient: Barbara Patton           Date of Birth: 1941-03-27           MRN: [id number] Visit Date: 08/01/2017 Requested by: Seward Carol, MD [address] [place], [place] [address] PCP: Seward Carol, MD  Subjective: Chief Complaint  Patient presents with  . Follow-up    review MRI scan    HPI: Barbara Patton is a patient here with multiple complaints but she is primarily here to follow-up her MRI scan of the pelvis.  Pelvic MRI shows a small little cyst under the right acetabulum.  She is not symptomatic on that side.  On the left-hand side she does have groin pain lateral trochanteric pain as well as back pain.  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  She does have loss of disc height at L4-5 and L5-S1.  She also has some diverticulosis in the sigmoid colon.  She reports continued pain in the hip region.              ROS: All systems reviewed are negative as they relate to the chief complaint within the history of present illness.  Patient denies  fevers or chills.   Assessment & Plan: Visit Diagnoses:  1. Pain in left hip     Plan: Impression is probable back mediated pain with fairly underwhelming MRI of the pelvis indicating a hip joint source of her symptoms.  Nonetheless she did get some relief from prior injections in November.  I will send her over to Dr. Ernestina Patches for left hip and trochanteric bursa injection today.  Bursitis was pretty minimal on the MRI scan of the pelvis on that left hip.  I will see her back as needed.  Follow-Up Instructions: No Follow-up on file.   Orders:  Orders Placed This Encounter  Procedures  . Ambulatory referral to Physical Medicine Rehab   No orders of the defined types were placed in this encounter.     Procedures: No procedures performed   Clinical Data: No additional findings.  Objective: Vital Signs: There were no vitals taken for this  visit.  Physical Exam:   Constitutional: Patient appears well-developed HEENT:  Head: Normocephalic Eyes:EOM are normal Neck: Normal range of motion Cardiovascular: Normal rate Pulmonary/chest: Effort normal Neurologic: Patient is alert Skin: Skin is warm Psychiatric: Patient has normal mood and affect    Ortho Exam: Orthopedic exam generally unchanged.  Not much of a Trendelenburg gait.  Strength is good with ankle dorsiflexion and plantar flexion.  Not much in the way of pain with forward or lateral bending.  Specialty Comments:  No specialty comments available.  Imaging: No results found.   PMFS History: There are no active problems to display for this patient.  Past Medical History:  Diagnosis Date  . High cholesterol   . Hypertension   . Thyroid disease     History reviewed. No pertinent family history.  Past Surgical History:  Procedure Laterality Date  . JOINT REPLACEMENT     Social History   Occupational History  . Not on file  Tobacco Use  . Smoking status: Former Smoker    Last attempt to quit: 1997    Years since quitting: 22.1  . Smokeless tobacco: Never Used  Substance and Sexual Activity  . Alcohol use: Yes    Alcohol/week: 0.6 oz    Types: 1 Glasses of wine  per week    Comment: occ wine  . Drug use: No  . Sexual activity: Not on file

## 2017-08-11 DIAGNOSIS — J0100 Acute maxillary sinusitis, unspecified: Secondary | ICD-10-CM | POA: Diagnosis not present

## 2017-08-16 ENCOUNTER — Ambulatory Visit (INDEPENDENT_AMBULATORY_CARE_PROVIDER_SITE_OTHER): Payer: Self-pay

## 2017-08-16 ENCOUNTER — Ambulatory Visit (INDEPENDENT_AMBULATORY_CARE_PROVIDER_SITE_OTHER): Payer: Medicare PPO | Admitting: Physical Medicine and Rehabilitation

## 2017-08-16 ENCOUNTER — Encounter (INDEPENDENT_AMBULATORY_CARE_PROVIDER_SITE_OTHER): Payer: Self-pay | Admitting: Physical Medicine and Rehabilitation

## 2017-08-16 DIAGNOSIS — M7062 Trochanteric bursitis, left hip: Secondary | ICD-10-CM | POA: Diagnosis not present

## 2017-08-16 DIAGNOSIS — M25552 Pain in left hip: Secondary | ICD-10-CM | POA: Diagnosis not present

## 2017-08-16 NOTE — Progress Notes (Signed)
Numeric Pain Rating Scale and Functional Assessment Average Pain 10   In the last MONTH (on 0-10 scale) has pain interfered with the following?  1. General activity like being  able to carry out your everyday physical activities such as walking, climbing stairs, carrying groceries, or moving a chair?  Rating(0)   +Driver, -BT, -Dye Allergies.   

## 2017-08-16 NOTE — Patient Instructions (Signed)

## 2017-08-16 NOTE — Progress Notes (Signed)
Barbara Patton - 77 y.o. female MRN [id number]  Date of birth: [birth year]  Office Visit Note: Visit Date: 08/16/2017 PCP: Barbara Carol, MD Referred by: Barbara Carol, MD  Subjective: Chief Complaint  Patient presents with  . Left Hip - Pain   HPI: Barbara Patton is a very pleasant 77 year old female that we last saw in November and completed a left intra-articular injection with fluoroscopic guidance of the hip as well as a greater trochanteric injection with fluoroscopic guidance.  She did get relief during that procedure.  She recently saw Dr. Marlou Patton for follow-up and he is requesting repeat of that procedure diagnostically and therapeutically.  She is having left lateral hip pain as well as hip and groin pain.   ROS Otherwise per HPI.  Assessment & Plan: Visit Diagnoses:  1. Pain in left hip   2. Greater trochanteric bursitis, left     Plan: Findings:  Diagnostic and hopefully therapeutic left intra-articular hip injection with fluoroscopic guidance as well as left greater trochanteric injection with fluoroscopic guidance.  The greater trochanteric injection was done with fluoroscopic guidance to the patient's body habitus.  Patient did seem to have some relief during the anesthetic phase of the injection.    Meds & Orders: No orders of the defined types were placed in this encounter.   Orders Placed This Encounter  Procedures  . Large Joint Inj: L hip joint  . Large Joint Inj: L greater trochanter  . XR C-ARM NO REPORT    Follow-up: Return if symptoms worsen or fail to improve, for Dr. Marlou Patton.   Procedures: Large Joint Inj: L hip joint on 08/16/2017 3:02 PM Indications: pain and diagnostic evaluation Details: 22 G needle, anterior approach  Arthrogram: Yes  Medications: 80 mg triamcinolone acetonide 40 MG/ML; 3 mL bupivacaine 0.5 % Outcome: tolerated well, no immediate complications  Arthrogram demonstrated excellent flow of contrast throughout the joint surface without  extravasation or obvious defect.  The patient had relief of symptoms during the anesthetic phase of the injection.  Procedure, treatment alternatives, risks and benefits explained, specific risks discussed. Consent was given by the patient. Immediately prior to procedure a time out was called to verify the correct patient, procedure, equipment, support staff and site/side marked as required. Patient was prepped and draped in the usual sterile fashion.   Large Joint Inj: L greater trochanter on 08/16/2017 3:02 PM Indications: pain and diagnostic evaluation Details: 22 G 3.5 in needle, fluoroscopy-guided lateral approach  Arthrogram: No  Medications: 4 mL lidocaine 2 %; 80 mg triamcinolone acetonide 40 MG/ML; 4 mL bupivacaine 0.25 % Outcome: tolerated well, no immediate complications  There was excellent flow of contrast outlined the greater trochanteric bursa without vascular uptake. Procedure, treatment alternatives, risks and benefits explained, specific risks discussed. Consent was given by the patient. Immediately prior to procedure a time out was called to verify the correct patient, procedure, equipment, support staff and site/side marked as required. Patient was prepped and draped in the usual sterile fashion.      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 Lateral recess narrowing right more than left at L4-5. Far right lateral protrusion at L5-S1. No significant central canal stenosis.   She reports that she quit smoking about 22 years ago. She has never used smokeless tobacco. No results for input(s): HGBA1C, LABURIC in the last 8760 hours.  Objective:  VS:  HT:    WT:   BMI:     BP:   HR: bpm  TEMP: ( )  RESP:  Physical Exam  Ortho Exam Imaging: No results found.  Past Medical/Family/Surgical/Social History: Medications & Allergies reviewed per EMR, new  medications updated. There are no active problems to display for this patient.  Past Medical History:  Diagnosis Date  . High cholesterol   . Hypertension   . Thyroid disease    History reviewed. No pertinent family history. Past Surgical History:  Procedure Laterality Date  . JOINT REPLACEMENT     Social History   Occupational History  . Not on file  Tobacco Use  . Smoking status: Former Smoker    Last attempt to quit: 1997    Years since quitting: 22.2  . Smokeless tobacco: Never Used  Substance and Sexual Activity  . Alcohol use: Yes    Alcohol/week: 0.6 oz    Types: 1 Glasses of wine per week    Comment: occ wine  . Drug use: No  . Sexual activity: Not on file

## 2017-09-05 MED ORDER — LIDOCAINE HCL 2 % IJ SOLN
4.0000 mL | INTRAMUSCULAR | Status: AC | PRN
  Administered 2017-08-16: 4 mL

## 2017-09-05 MED ORDER — BUPIVACAINE HCL 0.25 % IJ SOLN
4.0000 mL | INTRAMUSCULAR | Status: AC | PRN
  Administered 2017-08-16: 4 mL via INTRA_ARTICULAR

## 2017-09-05 MED ORDER — TRIAMCINOLONE ACETONIDE 40 MG/ML IJ SUSP
80.0000 mg | INTRAMUSCULAR | Status: AC | PRN
  Administered 2017-08-16: 80 mg via INTRA_ARTICULAR

## 2017-09-05 MED ORDER — BUPIVACAINE HCL 0.5 % IJ SOLN
3.0000 mL | INTRAMUSCULAR | Status: AC | PRN
  Administered 2017-08-16: 3 mL via INTRA_ARTICULAR

## 2017-09-07 DIAGNOSIS — Z0000 Encounter for general adult medical examination without abnormal findings: Secondary | ICD-10-CM | POA: Diagnosis not present

## 2017-09-07 DIAGNOSIS — E7800 Pure hypercholesterolemia, unspecified: Secondary | ICD-10-CM | POA: Diagnosis not present

## 2017-09-07 DIAGNOSIS — E039 Hypothyroidism, unspecified: Secondary | ICD-10-CM | POA: Diagnosis not present

## 2017-09-07 DIAGNOSIS — Z1389 Encounter for screening for other disorder: Secondary | ICD-10-CM | POA: Diagnosis not present

## 2017-09-07 DIAGNOSIS — M329 Systemic lupus erythematosus, unspecified: Secondary | ICD-10-CM | POA: Diagnosis not present

## 2017-09-07 DIAGNOSIS — I10 Essential (primary) hypertension: Secondary | ICD-10-CM | POA: Diagnosis not present

## 2017-09-17 DIAGNOSIS — E785 Hyperlipidemia, unspecified: Secondary | ICD-10-CM | POA: Diagnosis not present

## 2017-09-17 DIAGNOSIS — I10 Essential (primary) hypertension: Secondary | ICD-10-CM | POA: Diagnosis not present

## 2017-09-17 DIAGNOSIS — E039 Hypothyroidism, unspecified: Secondary | ICD-10-CM | POA: Diagnosis not present

## 2017-10-05 DIAGNOSIS — H2512 Age-related nuclear cataract, left eye: Secondary | ICD-10-CM | POA: Diagnosis not present

## 2017-10-05 DIAGNOSIS — H401122 Primary open-angle glaucoma, left eye, moderate stage: Secondary | ICD-10-CM | POA: Diagnosis not present

## 2017-10-05 DIAGNOSIS — H401113 Primary open-angle glaucoma, right eye, severe stage: Secondary | ICD-10-CM | POA: Diagnosis not present

## 2017-10-14 ENCOUNTER — Other Ambulatory Visit (INDEPENDENT_AMBULATORY_CARE_PROVIDER_SITE_OTHER): Payer: Self-pay | Admitting: Orthopedic Surgery

## 2017-10-15 NOTE — Telephone Encounter (Signed)
y

## 2017-10-15 NOTE — Telephone Encounter (Signed)
Ok to rf? 

## 2017-10-25 DIAGNOSIS — H2512 Age-related nuclear cataract, left eye: Secondary | ICD-10-CM | POA: Diagnosis not present

## 2017-10-25 DIAGNOSIS — H25812 Combined forms of age-related cataract, left eye: Secondary | ICD-10-CM | POA: Diagnosis not present

## 2017-11-08 DIAGNOSIS — L308 Other specified dermatitis: Secondary | ICD-10-CM | POA: Diagnosis not present

## 2017-11-28 DIAGNOSIS — M329 Systemic lupus erythematosus, unspecified: Secondary | ICD-10-CM | POA: Diagnosis not present

## 2017-11-28 DIAGNOSIS — M150 Primary generalized (osteo)arthritis: Secondary | ICD-10-CM | POA: Diagnosis not present

## 2017-11-28 DIAGNOSIS — M5136 Other intervertebral disc degeneration, lumbar region: Secondary | ICD-10-CM | POA: Diagnosis not present

## 2017-11-28 DIAGNOSIS — R5383 Other fatigue: Secondary | ICD-10-CM | POA: Diagnosis not present

## 2017-11-28 DIAGNOSIS — L299 Pruritus, unspecified: Secondary | ICD-10-CM | POA: Diagnosis not present

## 2017-11-28 DIAGNOSIS — E559 Vitamin D deficiency, unspecified: Secondary | ICD-10-CM | POA: Diagnosis not present

## 2017-11-28 DIAGNOSIS — M797 Fibromyalgia: Secondary | ICD-10-CM | POA: Diagnosis not present

## 2017-11-28 DIAGNOSIS — H2011 Chronic iridocyclitis, right eye: Secondary | ICD-10-CM | POA: Diagnosis not present

## 2017-11-28 DIAGNOSIS — E669 Obesity, unspecified: Secondary | ICD-10-CM | POA: Diagnosis not present

## 2017-12-03 DIAGNOSIS — H35052 Retinal neovascularization, unspecified, left eye: Secondary | ICD-10-CM | POA: Diagnosis not present

## 2017-12-03 DIAGNOSIS — H3500 Unspecified background retinopathy: Secondary | ICD-10-CM | POA: Diagnosis not present

## 2017-12-12 DIAGNOSIS — H348322 Tributary (branch) retinal vein occlusion, left eye, stable: Secondary | ICD-10-CM | POA: Diagnosis not present

## 2018-01-02 ENCOUNTER — Ambulatory Visit (INDEPENDENT_AMBULATORY_CARE_PROVIDER_SITE_OTHER): Payer: Medicare PPO

## 2018-01-02 ENCOUNTER — Encounter (INDEPENDENT_AMBULATORY_CARE_PROVIDER_SITE_OTHER): Payer: Self-pay | Admitting: Orthopedic Surgery

## 2018-01-02 ENCOUNTER — Ambulatory Visit (INDEPENDENT_AMBULATORY_CARE_PROVIDER_SITE_OTHER): Payer: Medicare PPO | Admitting: Orthopedic Surgery

## 2018-01-02 DIAGNOSIS — M25561 Pain in right knee: Secondary | ICD-10-CM | POA: Diagnosis not present

## 2018-01-02 DIAGNOSIS — M25552 Pain in left hip: Secondary | ICD-10-CM | POA: Diagnosis not present

## 2018-01-02 DIAGNOSIS — G8929 Other chronic pain: Secondary | ICD-10-CM | POA: Diagnosis not present

## 2018-01-02 DIAGNOSIS — M25562 Pain in left knee: Secondary | ICD-10-CM | POA: Diagnosis not present

## 2018-01-02 DIAGNOSIS — M7551 Bursitis of right shoulder: Secondary | ICD-10-CM | POA: Diagnosis not present

## 2018-01-02 MED ORDER — METHYLPREDNISOLONE ACETATE 40 MG/ML IJ SUSP
40.0000 mg | INTRAMUSCULAR | Status: AC | PRN
Start: 2018-01-02 — End: 2018-01-02
  Administered 2018-01-02: 40 mg via INTRA_ARTICULAR

## 2018-01-02 MED ORDER — BUPIVACAINE HCL 0.5 % IJ SOLN
9.0000 mL | INTRAMUSCULAR | Status: AC | PRN
  Administered 2018-01-02: 9 mL via INTRA_ARTICULAR

## 2018-01-02 MED ORDER — LIDOCAINE HCL 1 % IJ SOLN
5.0000 mL | INTRAMUSCULAR | Status: AC | PRN
  Administered 2018-01-02: 5 mL

## 2018-01-02 NOTE — Progress Notes (Signed)
Office Visit Note   Patient: Barbara Patton           Date of Birth: April 05, 1941           MRN: [id number] Visit Date: 01/02/2018 Requested by: Barbara Carol, MD [address] [place], [place] [address] PCP: Barbara Carol, MD  Subjective: Chief Complaint  Patient presents with  . MULTI-JOINT PAIN    HPI: Barbara Patton is a patient with multiple joint complaints.  "I hurt all over".  She states that nothing is really helping.  Her symptoms are getting worse.  She reports pain in both knees which she has had replaced.  Both hips which had an injection in the left hip on 08/16/2017 as well as both feet.  Had MRI scan of that right ankle in 2017 which was unremarkable.  She is also having some right shoulder pain which radiates down to the arm and in the trapezial and neck region.  She takes tramadol Tylenol and topical for her symptoms.  She has not known diagnosis of fibromyalgia and lupus.              ROS: All systems reviewed are negative as they relate to the chief complaint within the history of present illness.  Patient denies  fevers or chills. Impression is  Assessment & Plan: Visit Diagnoses:  1. Chronic pain of both knees   2. Pain in left hip     Plan: Right shoulder pain which looks like bursitis with no rotator cuff weakness.  Subacromial injection performed on that.  The left hip we can get another injection done with Barbara Patton since that gave her some relief.  Right ankle does not look like it is a structural problem on exam.  We have normal MRI scan from 2 years ago.  Both knees are okay radiographically and on exam would just have pain.  They do not look loose or infected at this time.  Follow-Up Instructions: No follow-ups on file.   Orders:  Orders Placed This Encounter  Procedures  . XR Knee 1-2 Views Right  . XR Knee 1-2 Views Left  . Ambulatory referral to Physical Medicine Rehab   No orders of the defined types were placed in this  encounter.     Procedures: Large Joint Inj: R subacromial bursa on 01/02/2018 11:39 AM Indications: diagnostic evaluation and pain Details: 18 G 1.5 in needle, posterior approach  Arthrogram: No  Medications: 9 mL bupivacaine 0.5 %; 40 mg methylPREDNISolone acetate 40 MG/ML; 5 mL lidocaine 1 % Outcome: tolerated well, no immediate complications Procedure, treatment alternatives, risks and benefits explained, specific risks discussed. Consent was given by the patient. Immediately prior to procedure a time out was called to verify the correct patient, procedure, equipment, support staff and site/side marked as required. Patient was prepped and draped in the usual sterile fashion.       Clinical Data: No additional findings.  Objective: Vital Signs: There were no vitals taken for this visit.  Physical Exam:   Constitutional: Patient appears well-developed HEENT:  Head: Normocephalic Eyes:EOM are normal Neck: Normal range of motion Cardiovascular: Normal rate Pulmonary/chest: Effort normal Neurologic: Patient is alert Skin: Skin is warm Psychiatric: Patient has normal mood and affect    Ortho Exam: Ortho exam demonstrates palpable intact nontender anterior to posterior to peroneal and Achilles tendons on the right ankle.  Pedal pulses palpable.  She does have a little bit of tenderness to palpation around that peroneus brevis  tendon is as well as around the calcaneocuboid joint.  No swelling or warmth in the ankle region.  Both knees are examined.  She has flexion past 90 no effusion and no warmth in either knee.  No mid flexion instability.  Patella tracks well bilaterally.  No definite groin pain with internal/external rotation of the leg which she does have bursal tenderness bilaterally in the hips.  Right shoulder is examined.  She has good range of motion actively and passively with no loss of rotator cuff strength to infraspinatus supraspinatus and subscap muscle testing.   On that left shoulder where she is had previous repair think she does have some limitation of motion and more coarseness and grinding there.  Specialty Comments:  No specialty comments available.  Imaging: Xr Knee 1-2 Views Left  Result Date: 01/02/2018 AP lateral left knee reviewed.  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  Xr Knee 1-2 Views Right  Result Date: 01/02/2018 AP lateral right knee reviewed.  Total knee prosthesis in good position and alignment.  No evidence of loosening at the bone cement interface.  No complicating features    PMFS History: There are no active problems to display for this patient.  Past Medical History:  Diagnosis Date  . High cholesterol   . Hypertension   . Thyroid disease     History reviewed. No pertinent family history.  Past Surgical History:  Procedure Laterality Date  . JOINT REPLACEMENT     Social History   Occupational History  . Not on file  Tobacco Use  . Smoking status: Former Smoker    Last attempt to quit: 1997    Years since quitting: 22.5  . Smokeless tobacco: Never Used  Substance and Sexual Activity  . Alcohol use: Yes    Alcohol/week: 0.6 oz    Types: 1 Glasses of wine per week    Comment: occ wine  . Drug use: No  . Sexual activity: Not on file

## 2018-01-15 ENCOUNTER — Telehealth (INDEPENDENT_AMBULATORY_CARE_PROVIDER_SITE_OTHER): Payer: Self-pay | Admitting: Orthopedic Surgery

## 2018-01-15 NOTE — Telephone Encounter (Signed)
Patient is having a dental procedure and will need an antibiotic sent to [facility]. Patient would like a call once called in # [phone]

## 2018-01-16 MED ORDER — CLINDAMYCIN HCL 300 MG PO CAPS: ORAL_CAPSULE | ORAL | 0 refills | Status: DC

## 2018-01-16 NOTE — Telephone Encounter (Signed)
Sent to pharmacy on file for patient that is listed in her chart.

## 2018-01-18 ENCOUNTER — Encounter (INDEPENDENT_AMBULATORY_CARE_PROVIDER_SITE_OTHER): Payer: Self-pay | Admitting: Physical Medicine and Rehabilitation

## 2018-01-18 ENCOUNTER — Ambulatory Visit (INDEPENDENT_AMBULATORY_CARE_PROVIDER_SITE_OTHER): Payer: Medicare PPO | Admitting: Physical Medicine and Rehabilitation

## 2018-01-18 ENCOUNTER — Ambulatory Visit (INDEPENDENT_AMBULATORY_CARE_PROVIDER_SITE_OTHER): Payer: Self-pay

## 2018-01-18 DIAGNOSIS — M25552 Pain in left hip: Secondary | ICD-10-CM

## 2018-01-18 NOTE — Progress Notes (Signed)
Barbara Patton - 77 y.o. female MRN [id number]  Date of birth: [birth year]  Office Visit Note: Visit Date: 01/18/2018 PCP: Barbara Carol, MD Referred by: Barbara Carol, MD  Subjective: Chief Complaint  Patient presents with  . Left Hip - Pain   HPI: Barbara Patton is a 77 year old female well-known to [place] and is followed by Dr. Anderson Patton.  In March we completed left intra-articular hip injection as well as greater trochanteric injection using fluoroscopic guidance and she got almost 100% relief up until just 3 weeks ago and the pain has started to return and it is very problematic for her and she rates her pain as an 8 out of 10.  She has had no new injury or falls.  Pain is in the left hip and groin and laterally.  We are going to repeat the left intra-articular anesthetic arthrogram as well as the greater trochanteric injection.  She will follow-up with Dr. Marlou Patton.   ROS Otherwise per HPI.  Assessment & Plan: Visit Diagnoses:  1. Pain in left hip     Plan: No additional findings.   Meds & Orders: No orders of the defined types were placed in this encounter.  No orders of the defined types were placed in this encounter.   Follow-up: No follow-ups on file.   Procedures: Large Joint Inj: L hip joint on 01/18/2018 2:19 PM Indications: pain and diagnostic evaluation Details: 22 G needle, anterior approach  Arthrogram: Yes  Medications: 3 mL bupivacaine 0.5 %; 80 mg triamcinolone acetonide 40 MG/ML Outcome: tolerated well, no immediate complications  Arthrogram demonstrated excellent flow of contrast throughout the joint surface without extravasation or obvious defect.  The patient had relief of symptoms during the anesthetic phase of the injection.  Procedure, treatment alternatives, risks and benefits explained, specific risks discussed. Consent was given by the patient. Immediately prior to procedure a time out was called to verify the correct patient, procedure, equipment,  support staff and site/side marked as required. Patient was prepped and draped in the usual sterile fashion.   Large Joint Inj: L greater trochanter on 01/18/2018 2:35 PM Indications: pain and diagnostic evaluation Details: 22 G 3.5 in needle, fluoroscopy-guided lateral approach  Arthrogram: No  Medications: 4 mL bupivacaine 0.25 %; 4 mL lidocaine 2 %; 80 mg triamcinolone acetonide 40 MG/ML Outcome: tolerated well, no immediate complications  There was excellent flow of contrast outlined the greater trochanteric bursa without vascular uptake. Procedure, treatment alternatives, risks and benefits explained, specific risks discussed. Consent was given by the patient. Immediately prior to procedure a time out was called to verify the correct patient, procedure, equipment, support staff and site/side marked as required. Patient was prepped and draped in the usual sterile fashion.      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 Lateral recess narrowing right more than left at L4-5. Far right lateral protrusion at L5-S1. No significant central canal stenosis.   She reports that she quit smoking about 22 years ago. She has never used smokeless tobacco. No results for input(s): HGBA1C, LABURIC in the last 8760 hours.  Objective:  VS:  HT:    WT:   BMI:     BP:   HR: bpm  TEMP: ( )  RESP:  Physical Exam  Ortho Exam Imaging: No results found.  Past Medical/Family/Surgical/Social History: Medications & Allergies reviewed per EMR,  new medications updated. There are no active problems to display for this patient.  Past Medical History:  Diagnosis Date  . High cholesterol   . Hypertension   . Thyroid disease    History reviewed. No pertinent family history. Past Surgical History:  Procedure Laterality Date  . JOINT REPLACEMENT     Social History   Occupational  History  . Not on file  Tobacco Use  . Smoking status: Former Smoker    Last attempt to quit: 1997    Years since quitting: 22.6  . Smokeless tobacco: Never Used  Substance and Sexual Activity  . Alcohol use: Yes    Alcohol/week: 1.0 standard drinks    Types: 1 Glasses of wine per week    Comment: occ wine  . Drug use: No  . Sexual activity: Not on file

## 2018-01-18 NOTE — Patient Instructions (Signed)

## 2018-01-18 NOTE — Progress Notes (Signed)
Pt states pain in left hip (groin pain). Pt states pain started back up 3 weeks ago, pt states last injection gave her 100% of relief. Pt states moving makes pain worse, medication takes the edge off.   .Numeric Pain Rating Scale and Functional Assessment Average Pain 8   In the last MONTH (on 0-10 scale) has pain interfered with the following?  1. General activity like being  able to carry out your everyday physical activities such as walking, climbing stairs, carrying groceries, or moving a chair?  Rating(4)   -Dye Allergies.

## 2018-01-25 DIAGNOSIS — H43812 Vitreous degeneration, left eye: Secondary | ICD-10-CM | POA: Diagnosis not present

## 2018-01-28 DIAGNOSIS — E785 Hyperlipidemia, unspecified: Secondary | ICD-10-CM | POA: Diagnosis not present

## 2018-01-28 DIAGNOSIS — I10 Essential (primary) hypertension: Secondary | ICD-10-CM | POA: Diagnosis not present

## 2018-01-28 DIAGNOSIS — E039 Hypothyroidism, unspecified: Secondary | ICD-10-CM | POA: Diagnosis not present

## 2018-01-29 MED ORDER — TRIAMCINOLONE ACETONIDE 40 MG/ML IJ SUSP
80.0000 mg | INTRAMUSCULAR | Status: AC | PRN
  Administered 2018-01-18: 80 mg via INTRA_ARTICULAR

## 2018-01-29 MED ORDER — LIDOCAINE HCL 2 % IJ SOLN
4.0000 mL | INTRAMUSCULAR | Status: AC | PRN
  Administered 2018-01-18: 4 mL

## 2018-01-29 MED ORDER — BUPIVACAINE HCL 0.25 % IJ SOLN
4.0000 mL | INTRAMUSCULAR | Status: AC | PRN
  Administered 2018-01-18: 4 mL via INTRA_ARTICULAR

## 2018-01-29 MED ORDER — BUPIVACAINE HCL 0.5 % IJ SOLN
3.0000 mL | INTRAMUSCULAR | Status: AC | PRN
  Administered 2018-01-18: 3 mL via INTRA_ARTICULAR

## 2018-02-08 DIAGNOSIS — H35352 Cystoid macular degeneration, left eye: Secondary | ICD-10-CM | POA: Diagnosis not present

## 2018-02-14 DIAGNOSIS — H59032 Cystoid macular edema following cataract surgery, left eye: Secondary | ICD-10-CM | POA: Diagnosis not present

## 2018-02-14 DIAGNOSIS — H903 Sensorineural hearing loss, bilateral: Secondary | ICD-10-CM | POA: Diagnosis not present

## 2018-02-14 DIAGNOSIS — H6982 Other specified disorders of Eustachian tube, left ear: Secondary | ICD-10-CM | POA: Diagnosis not present

## 2018-02-14 DIAGNOSIS — K219 Gastro-esophageal reflux disease without esophagitis: Secondary | ICD-10-CM | POA: Diagnosis not present

## 2018-02-14 DIAGNOSIS — H9202 Otalgia, left ear: Secondary | ICD-10-CM | POA: Diagnosis not present

## 2018-02-14 DIAGNOSIS — M26622 Arthralgia of left temporomandibular joint: Secondary | ICD-10-CM | POA: Diagnosis not present

## 2018-02-14 DIAGNOSIS — H93292 Other abnormal auditory perceptions, left ear: Secondary | ICD-10-CM | POA: Diagnosis not present

## 2018-02-14 DIAGNOSIS — H348322 Tributary (branch) retinal vein occlusion, left eye, stable: Secondary | ICD-10-CM | POA: Diagnosis not present

## 2018-02-22 ENCOUNTER — Ambulatory Visit (INDEPENDENT_AMBULATORY_CARE_PROVIDER_SITE_OTHER): Payer: Medicare PPO

## 2018-02-22 ENCOUNTER — Encounter (INDEPENDENT_AMBULATORY_CARE_PROVIDER_SITE_OTHER): Payer: Self-pay | Admitting: Family Medicine

## 2018-02-22 ENCOUNTER — Ambulatory Visit (INDEPENDENT_AMBULATORY_CARE_PROVIDER_SITE_OTHER): Payer: Medicare PPO | Admitting: Family Medicine

## 2018-02-22 DIAGNOSIS — M25562 Pain in left knee: Secondary | ICD-10-CM

## 2018-02-22 NOTE — Progress Notes (Signed)
Office Visit Note   Patient: Barbara Patton           Date of Birth: 06-Aug-1940           MRN: [id number] Visit Date: 02/22/2018 Requested by: Seward Carol, MD [address] [place], [place] [address] PCP: Seward Carol, MD  Subjective: Chief Complaint  Patient presents with  . Left Knee - Pain, Injury    [place] this morning while walking on a track at the gym. Landed directly on left knee.  H/o knee replacement.    HPI: She is a 77 year old with left knee pain.  This morning she was at the [facility] walking on the track when she tripped and fell forward landing on her knees.  Immediate pain in her left knee.  Able to get up with help, but she has had some soreness on the front of her knee since then.  She is concerned because she is status post bilateral knee replacements with revision of the left knee.              ROS: She has a history of fibromyalgia syndrome.  Objective: Vital Signs: There were no vitals taken for this visit.  Physical Exam:  Left knee: No effusion, full active extension and flexion of 120 degrees.  Is tender over the lateral aspect of the patella.  Extensor mechanism is intact.  No laxity with varus/valgus stress.  Imaging: 3 view left knee x-rays: Intact surgical hardware, no sign of loosening.  I believe she has a patella fracture oriented vertically seen only on the sunrise view.  It is nondisplaced.  Assessment & Plan: 1.  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  Return in 2 to 3 weeks if still having pain and we will repeat three-view x-rays at that point.   Follow-Up Instructions: Return in about 2 weeks (around 03/08/2018), or if symptoms worsen or fail to improve.       Procedures: None today.   PMFS History: Patient Active Problem List   Diagnosis Date Noted  . Bilateral  sensorineural hearing loss 08/23/2015  . Referred otalgia of left ear 08/23/2015  . 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 Allergic rhinitis 07/26/2015  . Anxiety 07/26/2015  . Benign essential hypertension 07/26/2015  . Dysfunction of left eustachian tube 07/26/2015  . Esophageal reflux 07/26/2015  . Fibromyalgia 07/26/2015  . Pure hypercholesterolemia 07/26/2015  . 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 Pes anserinus bursitis of left knee 12/04/2013  . Trochanteric bursitis of left hip 08/07/2013  . Leg swelling 12/06/2011  . Glaucoma 11/02/2011  . HLD (hyperlipidemia) 11/02/2011  . HTN (hypertension) 11/02/2011  . Hypothyroid 11/02/2011  . OA (osteoarthritis) 11/02/2011  . 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 ([place]) 03/16/2011  . Presence of artificial knee joint 03/16/2011   Past Medical History:  Diagnosis Date  . High cholesterol   . Hypertension   . Thyroid disease     No family history on file.  Past Surgical History:  Procedure Laterality Date  . JOINT REPLACEMENT     Social History   Occupational History  . Not on file  Tobacco Use  . Smoking status: Former Smoker    Last attempt to quit: 1997    Years since quitting: 22.7  .  Smokeless tobacco: Never Used  Substance and Sexual Activity  . Alcohol use: Yes    Alcohol/week: 1.0 standard drinks    Types: 1 Glasses of wine per week    Comment: occ wine  . Drug use: No  . Sexual activity: Not on file

## 2018-02-26 ENCOUNTER — Encounter (INDEPENDENT_AMBULATORY_CARE_PROVIDER_SITE_OTHER): Payer: Self-pay | Admitting: Family Medicine

## 2018-02-26 ENCOUNTER — Other Ambulatory Visit (INDEPENDENT_AMBULATORY_CARE_PROVIDER_SITE_OTHER): Payer: Self-pay | Admitting: Family Medicine

## 2018-02-26 ENCOUNTER — Ambulatory Visit (INDEPENDENT_AMBULATORY_CARE_PROVIDER_SITE_OTHER): Payer: Medicare PPO | Admitting: Family Medicine

## 2018-02-26 ENCOUNTER — Telehealth (INDEPENDENT_AMBULATORY_CARE_PROVIDER_SITE_OTHER): Payer: Self-pay | Admitting: Family Medicine

## 2018-02-26 DIAGNOSIS — M25562 Pain in left knee: Secondary | ICD-10-CM

## 2018-02-26 MED ORDER — DICLOFENAC SODIUM 1 % TD GEL: 4.0000 g | Freq: Four times a day (QID) | TRANSDERMAL | 6 refills | Status: DC | PRN

## 2018-02-26 MED ORDER — DICLOFENAC SODIUM 2 % TD SOLN: 1.0000 "application " | Freq: Two times a day (BID) | TRANSDERMAL | 6 refills | Status: DC | PRN

## 2018-02-26 NOTE — Telephone Encounter (Signed)
Can you call please

## 2018-02-26 NOTE — Telephone Encounter (Signed)
Will call in Pennsaid instead, but unfortunately insurance will likely not cover any of the topical meds.  May just need to use ice, and get started with physical therapy.

## 2018-02-26 NOTE — Telephone Encounter (Signed)
Informed patient

## 2018-02-26 NOTE — Progress Notes (Signed)
Office Visit Note   Patient: Barbara Patton           Date of Birth: [birth year]           MRN: [id number] Visit Date: 02/26/2018 Requested by: Seward Carol, MD [address] [place], [place] [address] PCP: Seward Carol, MD  Subjective: Chief Complaint  Patient presents with  . Left Knee - Pain    HPI: She is here with worsening left knee pain.  She thought she was getting better, but then her pain came back in the anterior knee.  Prior to that she was able to go bowling and do some other activities.  Now it hurts when she walks, feels better to sit.  She has noticed some swelling and warmth in her knee.  No personal or family history of gout.              ROS: Otherwise noncontributory  Objective: Vital Signs: There were no vitals taken for this visit.  Physical Exam:  Left knee: Full active extension, flexion of 90 degrees.  There is increased warmth compared to the right knee but no erythema.  1+ effusion.  Very tender to palpation over the anterior lateral patella and distal to it.  Imaging: Musculoskeletal ultrasound: It does appear that she has either a bipartite patella or a nondisplaced vertical fracture on the lateral aspect.  There is no hyperemia on power Doppler over that area.  She has fluid in the deep infrapatellar bursa with hyperemia.  No patella or quadriceps tendon pathology.  Assessment & Plan: 1.  Status post left knee contusion with posttraumatic deep infrapatellar bursitis, possible aggravation of the bipartite patella versus acute nondisplaced patella fracture -Ice applications, Voltaren gel, referral to physical therapy.  Repeat x-rays in 2 to 3 weeks if not improving.   Follow-Up Instructions: Return if symptoms worsen or fail to improve.       Procedures: None today.   PMFS History: Patient Active Problem List   Diagnosis Date Noted  . 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 Referred otalgia of left ear  08/23/2015  . 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 Allergic rhinitis 07/26/2015  . Anxiety 07/26/2015  . Benign essential hypertension 07/26/2015  . Dysfunction of left eustachian tube 07/26/2015  . Esophageal reflux 07/26/2015  . Fibromyalgia 07/26/2015  . Pure hypercholesterolemia 07/26/2015  . 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 Pes anserinus bursitis of left knee 12/04/2013  . Trochanteric bursitis of left hip 08/07/2013  . Leg swelling 12/06/2011  . Glaucoma 11/02/2011  . HLD (hyperlipidemia) 11/02/2011  . HTN (hypertension) 11/02/2011  . Hypothyroid 11/02/2011  . OA (osteoarthritis) 11/02/2011  . 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 ([place]) 03/16/2011  . Presence of artificial knee joint 03/16/2011   Past Medical History:  Diagnosis Date  . High cholesterol   . Hypertension   . Thyroid disease     History reviewed. No pertinent family history.  Past Surgical History:  Procedure Laterality Date  . JOINT REPLACEMENT     Social History   Occupational History  . Not on file  Tobacco Use  . Smoking status: Former Smoker    Last attempt to quit: 1997    Years since quitting: 22.7  . Smokeless tobacco: Never Used  Substance and Sexual Activity  . Alcohol use: Yes    Alcohol/week: 1.0 standard drinks  Types: 1 Glasses of wine per week    Comment: occ wine  . Drug use: No  . Sexual activity: Not on file

## 2018-02-26 NOTE — Telephone Encounter (Signed)
Please advise. Barbara Patton stated the pharmacy did not inform her of what Rx her insurance would cover.

## 2018-02-26 NOTE — Telephone Encounter (Signed)
Patient called will need another prescription due to insurance not covering Diclofenac. Patient asked if the office could please give her a call to discuss other options.

## 2018-02-27 ENCOUNTER — Telehealth (INDEPENDENT_AMBULATORY_CARE_PROVIDER_SITE_OTHER): Payer: Self-pay | Admitting: Family Medicine

## 2018-02-27 NOTE — Telephone Encounter (Signed)
Patient called advised her medication was denied by the insurance company. She advised they need additional information before the Rx's can be filled. The number to contact the insurance company is [phone]  The number to contact patient is [phone]

## 2018-02-28 ENCOUNTER — Telehealth (INDEPENDENT_AMBULATORY_CARE_PROVIDER_SITE_OTHER): Payer: Self-pay

## 2018-02-28 NOTE — Telephone Encounter (Signed)
Submitted authorization for Diclofenac and Pennsaid through [employer].

## 2018-02-28 NOTE — Telephone Encounter (Signed)
April has started PA

## 2018-03-04 ENCOUNTER — Telehealth (INDEPENDENT_AMBULATORY_CARE_PROVIDER_SITE_OTHER): Payer: Self-pay

## 2018-03-04 NOTE — Telephone Encounter (Signed)
Patient was denied for Diclofenac Sodium 1% Gel and Pennsaid 2% Solution through her insurance.  Please advise.  Thank You.

## 2018-03-04 NOTE — Telephone Encounter (Signed)
Notify pt - unfortunately insurance won't cover the topical medication.  If pain persists, we might consider a cortisone injection.

## 2018-03-05 DIAGNOSIS — R262 Difficulty in walking, not elsewhere classified: Secondary | ICD-10-CM | POA: Diagnosis not present

## 2018-03-05 DIAGNOSIS — M25562 Pain in left knee: Secondary | ICD-10-CM | POA: Diagnosis not present

## 2018-03-05 DIAGNOSIS — M7052 Other bursitis of knee, left knee: Secondary | ICD-10-CM | POA: Diagnosis not present

## 2018-03-05 DIAGNOSIS — M6281 Muscle weakness (generalized): Secondary | ICD-10-CM | POA: Diagnosis not present

## 2018-03-05 NOTE — Telephone Encounter (Signed)
Called and left a VM advising patient of message below concerning topical medication per Dr. Junius Roads.

## 2018-03-12 DIAGNOSIS — Z1231 Encounter for screening mammogram for malignant neoplasm of breast: Secondary | ICD-10-CM | POA: Diagnosis not present

## 2018-03-14 ENCOUNTER — Telehealth (INDEPENDENT_AMBULATORY_CARE_PROVIDER_SITE_OTHER): Payer: Self-pay

## 2018-03-14 ENCOUNTER — Encounter (INDEPENDENT_AMBULATORY_CARE_PROVIDER_SITE_OTHER): Payer: Self-pay | Admitting: Orthopedic Surgery

## 2018-03-14 ENCOUNTER — Ambulatory Visit (INDEPENDENT_AMBULATORY_CARE_PROVIDER_SITE_OTHER): Payer: Medicare PPO | Admitting: Orthopedic Surgery

## 2018-03-14 ENCOUNTER — Ambulatory Visit (INDEPENDENT_AMBULATORY_CARE_PROVIDER_SITE_OTHER): Payer: Medicare PPO

## 2018-03-14 DIAGNOSIS — M25562 Pain in left knee: Secondary | ICD-10-CM

## 2018-03-14 NOTE — Progress Notes (Signed)
Office Visit Note   Patient: Barbara Patton           Date of Birth: 25-Sep-1940           MRN: [id number] Visit Date: 03/14/2018 Requested by: Barbara Carol, MD [address] [place], [place] [address] PCP: Barbara Carol, MD  Subjective: Chief Complaint  Patient presents with  . patient states she hurts all over    HPI: Barbara Patton is a patient with multiple orthopedic complaints today.  She reports left knee pain after falling and impacting the left knee.  She was referred to physical therapy but she went there and states that it made it worse.  She reports bilateral shoulder pain as well including neck pain and hip pain.  She has a diagnosis of fibromyalgia and lupus.  She has had previous hip joint and bursa injections which helped.  This is on the left-hand side.  She is been taking over-the-counter medication for it and she is able to still bowl.  She is retired.              ROS: All systems reviewed are negative as they relate to the chief complaint within the history of present illness.  Patient denies  fevers or chills.   Assessment & Plan: Visit Diagnoses:  1. Left knee pain, unspecified chronicity     Plan: Impression is left knee pain with no radiographic abnormality and slight valgus laxity on exam.  In general this is something that we can watch.  No indication for intervention at this time.  Patient is weightbearing.  In regards to the shoulders I think that she does have a component of left shoulder bursitis.  She has known right shoulder rotator cuff tear.  She is managing reasonably well with both but I think a subacromial injection in the left shoulder may give her some benefit.  In regards to her neck I think she does have arthritis in the neck but does not really want to pursue any type of intervention for that.  In regards to the left hip I think she does have a component of bursitis and possible early arthritis in the joint.  I would like for her to see  Dr. Junius Patton for ultrasound-guided injection into the left hip bursa and the left hip joint.  I will see her back only as needed.  She wants to avoid all surgical intervention in the future.  Follow-Up Instructions: No follow-ups on file.   Orders:  Orders Placed This Encounter  Procedures  . XR Knee 1-2 Views Left   No orders of the defined types were placed in this encounter.     Procedures: No procedures performed   Clinical Data: No additional findings.  Objective: Vital Signs: There were no vitals taken for this visit.  Physical Exam:   Constitutional: Patient appears well-developed HEENT:  Head: Normocephalic Eyes:EOM are normal Neck: Normal range of motion Cardiovascular: Normal rate Pulmonary/chest: Effort normal Neurologic: Patient is alert Skin: Skin is warm Psychiatric: Patient has normal mood and affect    Ortho Exam: Ortho exam demonstrates full active and passive range of motion of both knees.  She has a little bit of laxity to valgus testing on the left but a solid endpoint.  No PCL instability.  Pedal pulses palpable.  Not much in the way of groin pain with internal or external rotation of either leg.  No nerve root tension signs.  Stiffness with range of motion with flexion  and extension but not really rotation.  Right shoulder has pretty reasonable passive range of motion but no weakness to infraspinatus supraspinatus or subscap muscle testing.  On the left-hand side there is a little bit more grinding with passive range of motion and some catching with external rotation and internal rotation at 90 degrees.  Motor or sensory function to the hands intact  Specialty Comments:  No specialty comments available.  Imaging: Xr Knee 1-2 Views Left  Result Date: 03/14/2018 AP lateral left knee reviewed.  Stemmed revision tibial prosthesis in good position and alignment with no evidence of failure.  Bottom of the prosthesis is not visualized.  Femoral component  also appears to have no evidence of complication with no evidence of loosening or malalignment.  Patella located within the trochlear groove.    PMFS History: Patient Active Problem List   Diagnosis Date Noted  . 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 Referred otalgia of left ear 08/23/2015  . 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 Allergic rhinitis 07/26/2015  . Anxiety 07/26/2015  . Benign essential hypertension 07/26/2015  . Dysfunction of left eustachian tube 07/26/2015  . Esophageal reflux 07/26/2015  . Fibromyalgia 07/26/2015  . Pure hypercholesterolemia 07/26/2015  . 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 Pes anserinus bursitis of left knee 12/04/2013  . Trochanteric bursitis of left hip 08/07/2013  . Leg swelling 12/06/2011  . Glaucoma 11/02/2011  . HLD (hyperlipidemia) 11/02/2011  . HTN (hypertension) 11/02/2011  . Hypothyroid 11/02/2011  . OA (osteoarthritis) 11/02/2011  . 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 ([place]) 03/16/2011  . Presence of artificial knee joint 03/16/2011   Past Medical History:  Diagnosis Date  . High cholesterol   . Hypertension   . Thyroid disease     History reviewed. No pertinent family history.  Past Surgical History:  Procedure Laterality Date  . JOINT REPLACEMENT     Social History   Occupational History  . Not on file  Tobacco Use  . Smoking status: Former Smoker    Last attempt to quit: 1997    Years since quitting: 22.7  . Smokeless tobacco: Never Used  Substance and Sexual Activity  . Alcohol use: Yes    Alcohol/week: 1.0 standard drinks    Types: 1 Glasses of wine per week    Comment: occ wine  . Drug use: No  . Sexual activity: Not on file

## 2018-03-14 NOTE — Telephone Encounter (Signed)
Dr Marlou Sa would like Dr Junius Roads to see patient for her left hip pain for possible left hip/bursa injection with ultrasound. Was not sure how much time he would need to have scheduled for this. I can call her if you will let me know, unless you would rather call her yourself. Either way is good with me, just let me know.

## 2018-03-15 DIAGNOSIS — I10 Essential (primary) hypertension: Secondary | ICD-10-CM | POA: Diagnosis not present

## 2018-03-15 DIAGNOSIS — E663 Overweight: Secondary | ICD-10-CM | POA: Diagnosis not present

## 2018-03-15 DIAGNOSIS — Z23 Encounter for immunization: Secondary | ICD-10-CM | POA: Diagnosis not present

## 2018-03-15 DIAGNOSIS — E7800 Pure hypercholesterolemia, unspecified: Secondary | ICD-10-CM | POA: Diagnosis not present

## 2018-03-15 DIAGNOSIS — E039 Hypothyroidism, unspecified: Secondary | ICD-10-CM | POA: Diagnosis not present

## 2018-03-15 NOTE — Telephone Encounter (Signed)
Patient called back would like to speak with Karna Christmas

## 2018-03-15 NOTE — Telephone Encounter (Signed)
Called the patient back and got her voice mail again.  I told her she could schedule the appointment with whomever answers the phone at the front office, or she can request me again and I will schedule it.  She needs a 20 minute appointment with Dr. Junius Roads for the left hip injection with ultrasound.

## 2018-03-15 NOTE — Telephone Encounter (Signed)
Left message on patient's voice mail to call me back to schedule the US-guided injection of her left hip.

## 2018-03-18 NOTE — Telephone Encounter (Signed)
ok 

## 2018-03-18 NOTE — Telephone Encounter (Signed)
FYI Patient has been using CBD oil with success and will hold off on injection with Dr Junius Roads for the hip/bursa injection.

## 2018-03-18 NOTE — Telephone Encounter (Signed)
FYI:  I spoke with the patient.  She has tried using some CBD oil and had 2 "really good" days last week.  She said she would like to try this at least another week and then call us back if she feels ready to have the hip injection(s).

## 2018-03-21 DIAGNOSIS — H59032 Cystoid macular edema following cataract surgery, left eye: Secondary | ICD-10-CM | POA: Diagnosis not present

## 2018-04-01 DIAGNOSIS — H00014 Hordeolum externum left upper eyelid: Secondary | ICD-10-CM | POA: Diagnosis not present

## 2018-04-04 ENCOUNTER — Ambulatory Visit (INDEPENDENT_AMBULATORY_CARE_PROVIDER_SITE_OTHER): Payer: Medicare PPO | Admitting: Surgery

## 2018-04-04 ENCOUNTER — Ambulatory Visit (INDEPENDENT_AMBULATORY_CARE_PROVIDER_SITE_OTHER): Payer: Self-pay

## 2018-04-04 ENCOUNTER — Encounter (INDEPENDENT_AMBULATORY_CARE_PROVIDER_SITE_OTHER): Payer: Self-pay | Admitting: Surgery

## 2018-04-04 DIAGNOSIS — M542 Cervicalgia: Secondary | ICD-10-CM

## 2018-04-04 DIAGNOSIS — M25511 Pain in right shoulder: Secondary | ICD-10-CM

## 2018-04-04 MED ORDER — METHYLPREDNISOLONE 2 MG PO TABS: ORAL_TABLET | ORAL | 0 refills | Status: DC

## 2018-04-04 NOTE — Progress Notes (Signed)
Office Visit Note   Patient: Barbara Patton           Date of Birth: [birth year]           MRN: [id number] Visit Date: 04/04/2018              Requested by: Seward Carol, MD [address] [place], [place] [address] PCP: Seward Carol, MD   Assessment & Plan: Visit Diagnoses:  1. Neck pain   2. Right shoulder pain, unspecified chronicity     Plan: At this point recommend conservative treatment with Medrol Dosepak 6-day taper to be taken as directed.  I reviewed cervical spine and right shoulder x-rays with patient today.  She will follow with Dr. Marlou Sa in about 3 weeks to discuss treatment options for her shoulder.  I did recommend that she avoid any activity that aggravates shoulder symptoms.  If her neck continues to be symptomatic I do recommend getting a cervical spine MRI to compare to previous study that was done in 2017.  Patient is not interested in any surgical procedures for her neck but we could try conservative management with cervical ESI's.  Follow-Up Instructions: Return in about 3 weeks (around 04/25/2018) for with dr dean recheck shoulder and neck.   Orders:  Orders Placed This Encounter  Procedures  . XR Shoulder Right  . XR Cervical Spine 2 or 3 views   Meds ordered this encounter  Medications  . methylPREDNISolone (MEDROL) 2 MG tablet    Sig: 6 day taper to be taken as directed.    Dispense:  21 tablet    Refill:  0      Procedures: No procedures performed   Clinical Data: No additional findings.   Subjective: Chief Complaint  Patient presents with  . Neck - Pain  . Left Shoulder - Pain    HPI 77 year old female comes in today with complaints of neck pain and right shoulder pain.  Patient has a known history of multilevel cervical spondylosis and has had previous MRI January 2017.  States that current pain started a couple of days ago.  No specific injury.  Patient did go bowling last Monday but did not have a specific injury at  that time.  Pain started a couple days later.  Right shoulder pain aggravated with overactivity and reaching on her back.  Pain also when she lays on her shoulder at night.  She also describes having right-sided neck pain that extends into the right shoulder blade and lateral right shoulder. Review of Systems No current cardiac pulmonary GI GU issues  Objective: Vital Signs: There were no vitals taken for this visit.  Physical Exam  Ortho Exam  Specialty Comments:  No specialty comments available.  Imaging: No results found.   PMFS History: Patient Active Problem List   Diagnosis Date Noted  . 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 Referred otalgia of left ear 08/23/2015  . 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 Allergic rhinitis 07/26/2015  . Anxiety 07/26/2015  . Benign essential hypertension 07/26/2015  . Dysfunction of left eustachian tube 07/26/2015  . Esophageal reflux 07/26/2015  . Fibromyalgia 07/26/2015  . Pure hypercholesterolemia 07/26/2015  . 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 Pes anserinus bursitis of left knee 12/04/2013  . Trochanteric bursitis of left hip 08/07/2013  . Leg swelling 12/06/2011  . Glaucoma 11/02/2011  . HLD (hyperlipidemia)  11/02/2011  . HTN (hypertension) 11/02/2011  . Hypothyroid 11/02/2011  . OA (osteoarthritis) 11/02/2011  . 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 ([place] [facility]) 03/16/2011  . Presence of artificial knee joint 03/16/2011   Past Medical History:  Diagnosis Date  . High cholesterol   . Hypertension   . Thyroid disease     No family history on file.  Past Surgical History:  Procedure Laterality Date  . JOINT REPLACEMENT     Social History   Occupational History  . Not on file  Tobacco Use  . Smoking status: Former Smoker    Last attempt to quit: 1997    Years since quitting: 22.8  . Smokeless tobacco:  Never Used  Substance and Sexual Activity  . Alcohol use: Yes    Alcohol/week: 1.0 standard drinks    Types: 1 Glasses of wine per week    Comment: occ wine  . Drug use: No  . Sexual activity: Not on file

## 2018-04-05 ENCOUNTER — Telehealth (INDEPENDENT_AMBULATORY_CARE_PROVIDER_SITE_OTHER): Payer: Self-pay | Admitting: Surgery

## 2018-04-05 MED ORDER — METHYLPREDNISOLONE 4 MG PO TABS: ORAL_TABLET | ORAL | 0 refills | Status: DC

## 2018-04-05 NOTE — Addendum Note (Signed)
Addended by: Minda Ditto, Alyse Low N on: 04/05/2018 02:26 PM   Modules accepted: Orders

## 2018-04-05 NOTE — Telephone Encounter (Signed)
See other message about this.

## 2018-04-05 NOTE — Telephone Encounter (Signed)
I changed the rx and sent in the new rx to [facility] and called and lmom for pt to let her know.

## 2018-04-05 NOTE — Telephone Encounter (Signed)
Yes

## 2018-04-05 NOTE — Telephone Encounter (Signed)
Patient called back asked if Rx can be sent to the [facility] on [address]. Please see previous message. The number to contact patient is [phone]

## 2018-04-05 NOTE — Telephone Encounter (Signed)
Can this be changed to 4 mg instead?

## 2018-04-05 NOTE — Telephone Encounter (Signed)
Barbara Patton,  Patient called stated seen Jeneen Rinks yesterday and he wrote a script formethylPREDNISolone (MEDROL) 2 MG tablet  To [facility]([address] [place] and they did not have it. [facility] called [facility] and they did not have it.  Please call patient to advise.  [phone]

## 2018-04-11 DIAGNOSIS — H18422 Band keratopathy, left eye: Secondary | ICD-10-CM | POA: Diagnosis not present

## 2018-04-18 ENCOUNTER — Ambulatory Visit (INDEPENDENT_AMBULATORY_CARE_PROVIDER_SITE_OTHER): Payer: Medicare PPO | Admitting: Orthopedic Surgery

## 2018-04-18 DIAGNOSIS — H43813 Vitreous degeneration, bilateral: Secondary | ICD-10-CM | POA: Diagnosis not present

## 2018-04-18 DIAGNOSIS — H34832 Tributary (branch) retinal vein occlusion, left eye, with macular edema: Secondary | ICD-10-CM | POA: Diagnosis not present

## 2018-04-24 ENCOUNTER — Ambulatory Visit (INDEPENDENT_AMBULATORY_CARE_PROVIDER_SITE_OTHER): Payer: Medicare PPO | Admitting: Orthopedic Surgery

## 2018-04-24 ENCOUNTER — Encounter (INDEPENDENT_AMBULATORY_CARE_PROVIDER_SITE_OTHER): Payer: Self-pay | Admitting: Orthopedic Surgery

## 2018-04-24 DIAGNOSIS — M542 Cervicalgia: Secondary | ICD-10-CM

## 2018-04-24 NOTE — Progress Notes (Signed)
Office Visit Note   Patient: Barbara Patton           Date of Birth: 09/26/1940           MRN: [id number] Visit Date: 04/24/2018 Requested by: Seward Carol, MD [address] [place], [place] [address] PCP: Seward Carol, MD  Subjective: Chief Complaint  Patient presents with  . Right Shoulder - Follow-up    HPI: Barbara Patton is a patient with many orthopedic complaints none of which are minimal to any type of surgical intervention.  She was last seen October and advised to follow-up for neck and right arm pain.  The pain is improved some but she still has discomfort.  Denies any history of injury.  She localizes pain in the right arm right at the deltoid insertion.  She also reports some trapezial pain in the right shoulder.  CBD oil has been helping both topically and orally.  She was scheduled for left hip injection in the trochanteric region and joint but the CBD oil was helping.  She also has fibromyalgia and lupus.  She does do classes on a daily basis for exercise and stretching.  She is never had neck injections.              ROS: All systems reviewed are negative as they relate to the chief complaint within the history of present illness.  Patient denies  fevers or chills.   Assessment & Plan: Visit Diagnoses:  1. Neck pain     Plan: Impression is neck and right arm pain with no identifiable pathology present.  I think she may have a little bit of deltoid insertional tendinitis.  Biceps tendinitis also in the differential along with degenerative changes in the cervical spine.  I think currently she is managing her symptoms well and no further treatment is indicated.  I do not think she would ever get injections in her neck.  She is Artie had injections in her shoulder.  Follow-up as needed for significant change in symptoms.  Follow-Up Instructions: Return if symptoms worsen or fail to improve.   Orders:  No orders of the defined types were placed in this  encounter.  No orders of the defined types were placed in this encounter.     Procedures: No procedures performed   Clinical Data: No additional findings.  Objective: Vital Signs: There were no vitals taken for this visit.  Physical Exam:   Constitutional: Patient appears well-developed HEENT:  Head: Normocephalic Eyes:EOM are normal Neck: Normal range of motion Cardiovascular: Normal rate Pulmonary/chest: Effort normal Neurologic: Patient is alert Skin: Skin is warm Psychiatric: Patient has normal mood and affect    Ortho Exam: Ortho exam demonstrates good cervical spine range of motion with 5 out of 5 grip EPL FPL interosseous wrist flexion wrist extension bicep triceps and deltoid strength.  Pedal pulses palpable.  Radial pulses palpable.  Her neck range of motion is actually pretty good today with flexion extension and rotation all close to normal.  Shoulder has mild crepitus on the right with passive range of motion and she does have a little bit of deltoid insertional tenderness.  No Popeye deformity on the right shoulder.  Good rotator cuff strength isolated infraspinatus supraspinatus and subscap muscle testing.  Specialty Comments:  No specialty comments available.  Imaging: No results found.   PMFS History: Patient Active Problem List   Diagnosis Date Noted  . 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 Referred otalgia of  left ear 08/23/2015  . 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 Allergic rhinitis 07/26/2015  . Anxiety 07/26/2015  . Benign essential hypertension 07/26/2015  . Dysfunction of left eustachian tube 07/26/2015  . Esophageal reflux 07/26/2015  . Fibromyalgia 07/26/2015  . Pure hypercholesterolemia 07/26/2015  . 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 Pes anserinus bursitis of left knee 12/04/2013  . Trochanteric bursitis of left hip  08/07/2013  . Leg swelling 12/06/2011  . Glaucoma 11/02/2011  . HLD (hyperlipidemia) 11/02/2011  . HTN (hypertension) 11/02/2011  . Hypothyroid 11/02/2011  . OA (osteoarthritis) 11/02/2011  . 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 ([facility]) 03/16/2011  . Presence of artificial knee joint 03/16/2011   Past Medical History:  Diagnosis Date  . High cholesterol   . Hypertension   . Thyroid disease     History reviewed. No pertinent family history.  Past Surgical History:  Procedure Laterality Date  . JOINT REPLACEMENT     Social History   Occupational History  . Not on file  Tobacco Use  . Smoking status: Former Smoker    Last attempt to quit: 1997    Years since quitting: 22.8  . Smokeless tobacco: Never Used  Substance and Sexual Activity  . Alcohol use: Yes    Alcohol/week: 1.0 standard drinks    Types: 1 Glasses of wine per week    Comment: occ wine  . Drug use: No  . Sexual activity: Not on file

## 2018-04-25 ENCOUNTER — Telehealth (INDEPENDENT_AMBULATORY_CARE_PROVIDER_SITE_OTHER): Payer: Self-pay | Admitting: Orthopedic Surgery

## 2018-04-25 NOTE — Telephone Encounter (Signed)
Patient was seen yesterday and said she forgot to mention to Dr. Marlou Sa that she had an x-ray of her right shoulder with Jeneen Rinks at her last visit and wanted to make sure Dr. Marlou Sa has reviewed it and give her a call back? Please advise # [phone]

## 2018-04-26 NOTE — Telephone Encounter (Signed)
Please review and advise. Thanks.  

## 2018-05-01 NOTE — Telephone Encounter (Signed)
I called patient and advised. 

## 2018-05-01 NOTE — Telephone Encounter (Signed)
I reviewed the x-ray.  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  There is narrowing of the acromiohumeral distance.  This may require shoulder replacement only when patient becomes symptomatic and painful.  She does not want he wants any type of further surgery but her shoulder does show pathology consistent with her known rotator cuff arthropathy.  And arthritis.

## 2018-05-07 DIAGNOSIS — H34832 Tributary (branch) retinal vein occlusion, left eye, with macular edema: Secondary | ICD-10-CM | POA: Diagnosis not present

## 2018-05-14 DIAGNOSIS — H59032 Cystoid macular edema following cataract surgery, left eye: Secondary | ICD-10-CM | POA: Diagnosis not present

## 2018-05-14 DIAGNOSIS — H43813 Vitreous degeneration, bilateral: Secondary | ICD-10-CM | POA: Diagnosis not present

## 2018-05-14 DIAGNOSIS — H348322 Tributary (branch) retinal vein occlusion, left eye, stable: Secondary | ICD-10-CM | POA: Diagnosis not present

## 2018-05-16 DIAGNOSIS — K219 Gastro-esophageal reflux disease without esophagitis: Secondary | ICD-10-CM | POA: Diagnosis not present

## 2018-05-16 DIAGNOSIS — H9202 Otalgia, left ear: Secondary | ICD-10-CM | POA: Diagnosis not present

## 2018-05-30 DIAGNOSIS — M797 Fibromyalgia: Secondary | ICD-10-CM | POA: Diagnosis not present

## 2018-05-30 DIAGNOSIS — E559 Vitamin D deficiency, unspecified: Secondary | ICD-10-CM | POA: Diagnosis not present

## 2018-05-30 DIAGNOSIS — M329 Systemic lupus erythematosus, unspecified: Secondary | ICD-10-CM | POA: Diagnosis not present

## 2018-05-30 DIAGNOSIS — R5383 Other fatigue: Secondary | ICD-10-CM | POA: Diagnosis not present

## 2018-05-30 DIAGNOSIS — M150 Primary generalized (osteo)arthritis: Secondary | ICD-10-CM | POA: Diagnosis not present

## 2018-05-30 DIAGNOSIS — H2011 Chronic iridocyclitis, right eye: Secondary | ICD-10-CM | POA: Diagnosis not present

## 2018-05-30 DIAGNOSIS — E669 Obesity, unspecified: Secondary | ICD-10-CM | POA: Diagnosis not present

## 2018-05-30 DIAGNOSIS — M5136 Other intervertebral disc degeneration, lumbar region: Secondary | ICD-10-CM | POA: Diagnosis not present

## 2018-05-30 DIAGNOSIS — Z6831 Body mass index (BMI) 31.0-31.9, adult: Secondary | ICD-10-CM | POA: Diagnosis not present

## 2018-05-31 DIAGNOSIS — H20022 Recurrent acute iridocyclitis, left eye: Secondary | ICD-10-CM | POA: Diagnosis not present

## 2018-06-07 DIAGNOSIS — H20022 Recurrent acute iridocyclitis, left eye: Secondary | ICD-10-CM | POA: Diagnosis not present

## 2018-06-17 DIAGNOSIS — H34812 Central retinal vein occlusion, left eye, with macular edema: Secondary | ICD-10-CM | POA: Diagnosis not present

## 2018-06-17 DIAGNOSIS — H43813 Vitreous degeneration, bilateral: Secondary | ICD-10-CM | POA: Diagnosis not present

## 2018-06-17 DIAGNOSIS — H35371 Puckering of macula, right eye: Secondary | ICD-10-CM | POA: Diagnosis not present

## 2018-06-17 DIAGNOSIS — H35031 Hypertensive retinopathy, right eye: Secondary | ICD-10-CM | POA: Diagnosis not present

## 2018-06-20 ENCOUNTER — Telehealth (INDEPENDENT_AMBULATORY_CARE_PROVIDER_SITE_OTHER): Payer: Self-pay | Admitting: Physical Medicine and Rehabilitation

## 2018-06-20 NOTE — Telephone Encounter (Signed)
Yes ok to repeat

## 2018-06-20 NOTE — Telephone Encounter (Signed)
Scheduled for 1/24 at 0900.

## 2018-06-21 DIAGNOSIS — H2000 Unspecified acute and subacute iridocyclitis: Secondary | ICD-10-CM | POA: Diagnosis not present

## 2018-06-26 ENCOUNTER — Ambulatory Visit (INDEPENDENT_AMBULATORY_CARE_PROVIDER_SITE_OTHER): Payer: Medicare HMO | Admitting: Orthopedic Surgery

## 2018-06-26 ENCOUNTER — Ambulatory Visit (INDEPENDENT_AMBULATORY_CARE_PROVIDER_SITE_OTHER): Payer: Medicare HMO

## 2018-06-26 ENCOUNTER — Encounter (INDEPENDENT_AMBULATORY_CARE_PROVIDER_SITE_OTHER): Payer: Self-pay | Admitting: Orthopedic Surgery

## 2018-06-26 VITALS — Ht 66.0 in | Wt 190.0 lb

## 2018-06-26 DIAGNOSIS — M25511 Pain in right shoulder: Secondary | ICD-10-CM

## 2018-06-26 DIAGNOSIS — G8929 Other chronic pain: Secondary | ICD-10-CM

## 2018-06-26 DIAGNOSIS — M25512 Pain in left shoulder: Secondary | ICD-10-CM

## 2018-06-28 ENCOUNTER — Encounter (INDEPENDENT_AMBULATORY_CARE_PROVIDER_SITE_OTHER): Payer: Self-pay | Admitting: Physical Medicine and Rehabilitation

## 2018-06-28 ENCOUNTER — Ambulatory Visit (INDEPENDENT_AMBULATORY_CARE_PROVIDER_SITE_OTHER): Payer: Self-pay

## 2018-06-28 ENCOUNTER — Ambulatory Visit (INDEPENDENT_AMBULATORY_CARE_PROVIDER_SITE_OTHER): Payer: Medicare HMO | Admitting: Physical Medicine and Rehabilitation

## 2018-06-28 DIAGNOSIS — M25551 Pain in right hip: Secondary | ICD-10-CM

## 2018-06-28 DIAGNOSIS — M7061 Trochanteric bursitis, right hip: Secondary | ICD-10-CM

## 2018-06-28 NOTE — Patient Instructions (Signed)

## 2018-06-28 NOTE — Progress Notes (Signed)
   Barbara Patton - 78 y.o. female MRN [id number]  Date of birth: [birth year]  Office Visit Note: Visit Date: 06/28/2018 PCP: Seward Carol, MD Referred by: Seward Carol, MD  Subjective: Chief Complaint  Patient presents with  . Left Hip - Pain   HPI:  Barbara Patton is a 78 y.o. female who comes in today For planned repeat left greater trochanteric bursa injection fluoroscopic guidance and intra-articular hip injection with fluoroscopic guidance.  Bursa injections done to the body habitus with fluoroscopic guidance.  Patient has a history of really all over body pain and lupus.  Questionable fibromyalgia but significant osteoarthritis.  She is also followed by Dr. Anderson Malta as well as Dr. Junius Roads and Benjiman Core, PA-C in the practice for multiple orthopedic complaints.  Last injection was in August that gave her a lot of relief.  ROS Otherwise per HPI.  Assessment & Plan: Visit Diagnoses:  1. Pain in right hip   2. Greater trochanteric bursitis, right     Plan: No additional findings.   Meds & Orders: No orders of the defined types were placed in this encounter.   Orders Placed This Encounter  Procedures  . Large Joint Inj: L hip joint  . Large Joint Inj: L greater trochanter  . XR C-ARM NO REPORT    Follow-up: No follow-ups on file.   Procedures: Large Joint Inj: L hip joint on 06/28/2018 9:10 AM Indications: pain and diagnostic evaluation Details: 22 G needle, anterior approach  Arthrogram: Yes  Medications: 3 mL bupivacaine 0.5 %; 80 mg triamcinolone acetonide 40 MG/ML Outcome: tolerated well, no immediate complications  Arthrogram demonstrated excellent flow of contrast throughout the joint surface without extravasation or obvious defect.  The patient had relief of symptoms during the anesthetic phase of the injection.  Procedure, treatment alternatives, risks and benefits explained, specific risks discussed. Consent was given by the patient. Immediately prior to  procedure a time out was called to verify the correct patient, procedure, equipment, support staff and site/side marked as required. Patient was prepped and draped in the usual sterile fashion.   Large Joint Inj: L greater trochanter on 06/28/2018 9:11 AM Indications: pain and diagnostic evaluation Details: 22 G 3.5 in needle, fluoroscopy-guided lateral approach  Arthrogram: No  Medications: 4 mL bupivacaine 0.25 %; 4 mL lidocaine 2 %; 40 mg triamcinolone acetonide 40 MG/ML Outcome: tolerated well, no immediate complications  Greatest area of pain over the greater trochanter was palpated and marked prior to injection. The patient did seem to have relief after the injection. Procedure, treatment alternatives, risks and benefits explained, specific risks discussed. Consent was given by the patient. Immediately prior to procedure a time out was called to verify the correct patient, procedure, equipment, support staff and site/side marked as required. Patient was prepped and draped in the usual sterile fashion.      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 Lateral recess narrowing right more than left at L4-5. Far right lateral protrusion at L5-S1. No significant central canal stenosis.     Objective:  VS:  HT:    WT:   BMI:     BP:   HR: bpm  TEMP: ( )  RESP:  Physical Exam  Ortho Exam Imaging: No results found.

## 2018-06-28 NOTE — Progress Notes (Signed)
 .  Numeric Pain Rating Scale and Functional Assessment Average Pain 10   In the last MONTH (on 0-10 scale) has pain interfered with the following?  1. General activity like being  able to carry out your everyday physical activities such as walking, climbing stairs, carrying groceries, or moving a chair?  Rating(7)    -Dye Allergies.  

## 2018-07-01 ENCOUNTER — Encounter (INDEPENDENT_AMBULATORY_CARE_PROVIDER_SITE_OTHER): Payer: Self-pay | Admitting: Orthopedic Surgery

## 2018-07-01 MED ORDER — TRIAMCINOLONE ACETONIDE 40 MG/ML IJ SUSP
40.0000 mg | INTRAMUSCULAR | Status: AC | PRN
  Administered 2018-06-28: 40 mg via INTRA_ARTICULAR

## 2018-07-01 MED ORDER — TRIAMCINOLONE ACETONIDE 40 MG/ML IJ SUSP
80.0000 mg | INTRAMUSCULAR | Status: AC | PRN
  Administered 2018-06-28: 80 mg via INTRA_ARTICULAR

## 2018-07-01 MED ORDER — LIDOCAINE HCL 2 % IJ SOLN
4.0000 mL | INTRAMUSCULAR | Status: AC | PRN
  Administered 2018-06-28: 4 mL

## 2018-07-01 MED ORDER — BUPIVACAINE HCL 0.5 % IJ SOLN
3.0000 mL | INTRAMUSCULAR | Status: AC | PRN
  Administered 2018-06-28: 3 mL via INTRA_ARTICULAR

## 2018-07-01 MED ORDER — BUPIVACAINE HCL 0.25 % IJ SOLN
4.0000 mL | INTRAMUSCULAR | Status: AC | PRN
  Administered 2018-06-28: 4 mL via INTRA_ARTICULAR

## 2018-07-01 NOTE — Progress Notes (Signed)
Office Visit Note   Patient: Barbara Patton           Date of Birth: [birth year]           MRN: [id number] Visit Date: 06/26/2018 Requested by: Seward Carol, MD [address] [place], [place] [address] PCP: Seward Carol, MD  Subjective: Chief Complaint  Patient presents with  . Right Shoulder - Pain  . Left Shoulder - Pain    HPI: Coree is a patient with multiple multiple orthopedic complaints and she desires no surgical correction for any of these complaints.  She reports bilateral shoulder pain right worse than left.  Has diminishing overhead function but is able to bowl.  Reports decreased range of motion and difficulty with dressing.  Hard to sleep at times because of pain.  She is getting left hip injection with Dr. Ernestina Patches this week.  She is right-hand dominant denies any interval history of injury to the shoulders.              ROS: All systems reviewed are negative as they relate to the chief complaint within the history of present illness.  Patient denies  fevers or chills.   Assessment & Plan: Visit Diagnoses:  1. Chronic pain of both shoulders     Plan: Impression is rotator cuff arthropathy bilateral shoulders in a patient who has some loss of forward flexion and abduction in both shoulders.  Nonetheless she is able to bowl and function.  She may need surgery sometime in the future but for now we will continue with nonoperative conservative measures and she will need to follow-up when she would like to have a change in that treatment algorithm.  Follow-Up Instructions: Return if symptoms worsen or fail to improve.   Orders:  Orders Placed This Encounter  Procedures  . XR Shoulder Right  . XR Shoulder Left   No orders of the defined types were placed in this encounter.     Procedures: No procedures performed   Clinical Data: No additional findings.  Objective: Vital Signs: Ht 5\' 6"  (1.676 m)   Wt 190 lb (86.2 kg)   BMI 30.67 kg/m    Physical Exam:   Constitutional: Patient appears well-developed HEENT:  Head: Normocephalic Eyes:EOM are normal Neck: Normal range of motion Cardiovascular: Normal rate Pulmonary/chest: Effort normal Neurologic: Patient is alert Skin: Skin is warm Psychiatric: Patient has normal mood and affect    Ortho Exam: Ortho exam demonstrates forward flexion and abduction in both shoulders to about 100 degrees of forward flexion and 90 degrees of abduction.  Some coarse grinding is present more on the left than the right.  Motor or sensory function of the hand is intact.  Cervical spine range of motion is pretty reasonable.  She does have some groin pain with internal X rotation of that left hip.  Specialty Comments:  No specialty comments available.  Imaging: No results found.   PMFS History: Patient Active Problem List   Diagnosis Date Noted  . 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 Referred otalgia of left ear 08/23/2015  . 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 Allergic rhinitis 07/26/2015  . Anxiety 07/26/2015  . Benign essential hypertension 07/26/2015  . Dysfunction of left eustachian tube 07/26/2015  . Esophageal reflux 07/26/2015  . Fibromyalgia 07/26/2015  . Pure hypercholesterolemia 07/26/2015  . 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 Pes  anserinus bursitis of left knee 12/04/2013  . Trochanteric bursitis of left hip 08/07/2013  . Leg swelling 12/06/2011  . Glaucoma 11/02/2011  . HLD (hyperlipidemia) 11/02/2011  . HTN (hypertension) 11/02/2011  . Hypothyroid 11/02/2011  . OA (osteoarthritis) 11/02/2011  . 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 ([place]) 03/16/2011  . Presence of artificial knee joint 03/16/2011   Past Medical History:  Diagnosis Date  . High cholesterol   . Hypertension   . Thyroid disease     History reviewed. No pertinent family  history.  Past Surgical History:  Procedure Laterality Date  . JOINT REPLACEMENT     Social History   Occupational History  . Not on file  Tobacco Use  . Smoking status: Former Smoker    Last attempt to quit: 1997    Years since quitting: 23.0  . Smokeless tobacco: Never Used  Substance and Sexual Activity  . Alcohol use: Yes    Alcohol/week: 1.0 standard drinks    Types: 1 Glasses of wine per week    Comment: occ wine  . Drug use: No  . Sexual activity: Not on file

## 2018-07-02 ENCOUNTER — Telehealth (INDEPENDENT_AMBULATORY_CARE_PROVIDER_SITE_OTHER): Payer: Self-pay | Admitting: Orthopedic Surgery

## 2018-07-02 NOTE — Telephone Encounter (Signed)
Patient called asked if she still need to take the (Clindamycin) before dental work is done. The number to contact patient is [phone]

## 2018-07-03 NOTE — Telephone Encounter (Signed)
IC patient LMVM advising yes, and to call us back if she needs a Rx.

## 2018-07-05 DIAGNOSIS — E785 Hyperlipidemia, unspecified: Secondary | ICD-10-CM | POA: Diagnosis not present

## 2018-07-05 DIAGNOSIS — E039 Hypothyroidism, unspecified: Secondary | ICD-10-CM | POA: Diagnosis not present

## 2018-07-05 DIAGNOSIS — I10 Essential (primary) hypertension: Secondary | ICD-10-CM | POA: Diagnosis not present

## 2018-08-02 DIAGNOSIS — H20022 Recurrent acute iridocyclitis, left eye: Secondary | ICD-10-CM | POA: Diagnosis not present

## 2018-08-07 DIAGNOSIS — I10 Essential (primary) hypertension: Secondary | ICD-10-CM | POA: Diagnosis not present

## 2018-08-07 DIAGNOSIS — E039 Hypothyroidism, unspecified: Secondary | ICD-10-CM | POA: Diagnosis not present

## 2018-08-07 DIAGNOSIS — E785 Hyperlipidemia, unspecified: Secondary | ICD-10-CM | POA: Diagnosis not present

## 2018-08-12 DIAGNOSIS — H52223 Regular astigmatism, bilateral: Secondary | ICD-10-CM | POA: Diagnosis not present

## 2018-08-12 DIAGNOSIS — H524 Presbyopia: Secondary | ICD-10-CM | POA: Diagnosis not present

## 2018-08-16 ENCOUNTER — Telehealth (INDEPENDENT_AMBULATORY_CARE_PROVIDER_SITE_OTHER): Payer: Self-pay | Admitting: Physical Medicine and Rehabilitation

## 2018-08-19 NOTE — Telephone Encounter (Signed)
She needs to limit these to approximately 3 to 4/year which would equate to 3 to 4 months apart.  At her age also she really does not need to schedule this currently with the [place] issue.

## 2018-08-19 NOTE — Telephone Encounter (Signed)
Can you call patient to advise/ schedule for late April or May?

## 2018-08-20 NOTE — Telephone Encounter (Signed)
Advised. Pt is scheduled for 09/27/2018 at 1015a

## 2018-08-26 DIAGNOSIS — H34812 Central retinal vein occlusion, left eye, with macular edema: Secondary | ICD-10-CM | POA: Diagnosis not present

## 2018-09-17 DIAGNOSIS — H20022 Recurrent acute iridocyclitis, left eye: Secondary | ICD-10-CM | POA: Diagnosis not present

## 2018-09-25 ENCOUNTER — Ambulatory Visit (INDEPENDENT_AMBULATORY_CARE_PROVIDER_SITE_OTHER): Payer: Medicare HMO | Admitting: Orthopedic Surgery

## 2018-09-25 ENCOUNTER — Other Ambulatory Visit: Payer: Self-pay

## 2018-09-25 ENCOUNTER — Encounter (INDEPENDENT_AMBULATORY_CARE_PROVIDER_SITE_OTHER): Payer: Self-pay | Admitting: Orthopedic Surgery

## 2018-09-25 DIAGNOSIS — M7551 Bursitis of right shoulder: Secondary | ICD-10-CM

## 2018-09-27 ENCOUNTER — Ambulatory Visit (INDEPENDENT_AMBULATORY_CARE_PROVIDER_SITE_OTHER): Payer: Self-pay | Admitting: Physical Medicine and Rehabilitation

## 2018-09-27 ENCOUNTER — Encounter (INDEPENDENT_AMBULATORY_CARE_PROVIDER_SITE_OTHER): Payer: Self-pay | Admitting: Orthopedic Surgery

## 2018-09-27 DIAGNOSIS — M7551 Bursitis of right shoulder: Secondary | ICD-10-CM | POA: Diagnosis not present

## 2018-09-27 MED ORDER — LIDOCAINE HCL 1 % IJ SOLN
5.0000 mL | INTRAMUSCULAR | Status: AC | PRN
  Administered 2018-09-27: 10:00:00 5 mL

## 2018-09-27 MED ORDER — METHYLPREDNISOLONE ACETATE 40 MG/ML IJ SUSP
40.0000 mg | INTRAMUSCULAR | Status: AC | PRN
  Administered 2018-09-27: 10:00:00 40 mg via INTRA_ARTICULAR

## 2018-09-27 MED ORDER — BUPIVACAINE HCL 0.5 % IJ SOLN
9.0000 mL | INTRAMUSCULAR | Status: AC | PRN
  Administered 2018-09-27: 9 mL via INTRA_ARTICULAR

## 2018-09-27 NOTE — Progress Notes (Signed)
Office Visit Note   Patient: Barbara Patton           Date of Birth: 1940/07/11           MRN: [id number] Visit Date: 09/25/2018 Requested by: Seward Carol, MD [address] [place], [place] [address] PCP: Seward Carol, MD  Subjective: Chief Complaint  Patient presents with  . Right Shoulder - Pain    HPI: Barbara Patton presents for evaluation of right shoulder.  She states that her shoulder still painful.  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  Having a lot of generalized body aches.  She is had an injection already in that right shoulder this year which helps.  This was a subacromial injection.              ROS: All systems reviewed are negative as they relate to the chief complaint within the history of present illness.  Patient denies  fevers or chills.   Assessment & Plan: Visit Diagnoses:  1. Bursitis of right shoulder     Plan: Impression is right shoulder arthritis and bursitis.  Plan is right shoulder subacromial injection.  Potentially we could do one more this year but I really want to hold off on anything more for now.  I will see her back as needed.  Continue with below shoulder level rotator cuff strengthening exercises.  Follow-Up Instructions: Return if symptoms worsen or fail to improve.   Orders:  No orders of the defined types were placed in this encounter.  No orders of the defined types were placed in this encounter.     Procedures: Large Joint Inj: R subacromial bursa on 09/27/2018 10:28 AM Indications: diagnostic evaluation and pain Details: 18 G 1.5 in needle, posterior approach  Arthrogram: No  Medications: 9 mL bupivacaine 0.5 %; 40 mg methylPREDNISolone acetate 40 MG/ML; 5 mL lidocaine 1 % Outcome: tolerated well, no immediate complications Procedure, treatment alternatives, risks and benefits explained, specific risks discussed. Consent was given by the patient. Immediately prior to procedure a time out was called to  verify the correct patient, procedure, equipment, support staff and site/side marked as required. Patient was prepped and draped in the usual sterile fashion.       Clinical Data: No additional findings.  Objective: Vital Signs: There were no vitals taken for this visit.  Physical Exam:   Constitutional: Patient appears well-developed HEENT:  Head: Normocephalic Eyes:EOM are normal Neck: Normal range of motion Cardiovascular: Normal rate Pulmonary/chest: Effort normal Neurologic: Patient is alert Skin: Skin is warm Psychiatric: Patient has normal mood and affect    Ortho Exam: Ortho exam demonstrates good cervical spine range of motion.  5 out of 5 grip EPL FPL interosseous wrist flexion extension bicep triceps and deltoid strength.  Does have pain in the biceps region primarily.  Cuff strength is slightly weak to infraspinatus and supraspinatus testing on the right-hand side.  No Popeye deformity is present.  No discrete AC joint tenderness is present on the right-hand side.  I think that this may be impending rupture of her biceps tendon due to rotator cuff pathology.  Specialty Comments:  No specialty comments available.  Imaging: No results found.   PMFS History: Patient Active Problem List   Diagnosis Date Noted  . 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 Referred otalgia of left ear 08/23/2015  . 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 Allergic rhinitis 07/26/2015  . Anxiety 07/26/2015  . Benign essential hypertension 07/26/2015  .  Dysfunction of left eustachian tube 07/26/2015  . Esophageal reflux 07/26/2015  . Fibromyalgia 07/26/2015  . Pure hypercholesterolemia 07/26/2015  . 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 Pes anserinus bursitis of left knee 12/04/2013  . Trochanteric bursitis of left hip 08/07/2013  . Leg swelling 12/06/2011  . Glaucoma  11/02/2011  . HLD (hyperlipidemia) 11/02/2011  . HTN (hypertension) 11/02/2011  . Hypothyroid 11/02/2011  . OA (osteoarthritis) 11/02/2011  . 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 ([facility]) 03/16/2011  . Presence of artificial knee joint 03/16/2011   Past Medical History:  Diagnosis Date  . High cholesterol   . Hypertension   . Thyroid disease     History reviewed. No pertinent family history.  Past Surgical History:  Procedure Laterality Date  . JOINT REPLACEMENT     Social History   Occupational History  . Not on file  Tobacco Use  . Smoking status: Former Smoker    Last attempt to quit: 1997    Years since quitting: 23.3  . Smokeless tobacco: Never Used  Substance and Sexual Activity  . Alcohol use: Yes    Alcohol/week: 1.0 standard drinks    Types: 1 Glasses of wine per week    Comment: occ wine  . Drug use: No  . Sexual activity: Not on file

## 2018-10-16 DIAGNOSIS — H20022 Recurrent acute iridocyclitis, left eye: Secondary | ICD-10-CM | POA: Diagnosis not present

## 2018-10-16 DIAGNOSIS — H401133 Primary open-angle glaucoma, bilateral, severe stage: Secondary | ICD-10-CM | POA: Diagnosis not present

## 2018-10-17 ENCOUNTER — Ambulatory Visit: Payer: Medicare HMO

## 2018-10-17 ENCOUNTER — Other Ambulatory Visit: Payer: Self-pay

## 2018-10-17 ENCOUNTER — Ambulatory Visit (INDEPENDENT_AMBULATORY_CARE_PROVIDER_SITE_OTHER): Payer: 59 | Admitting: Physical Medicine and Rehabilitation

## 2018-10-17 ENCOUNTER — Encounter: Payer: Self-pay | Admitting: Physical Medicine and Rehabilitation

## 2018-10-17 DIAGNOSIS — M25552 Pain in left hip: Secondary | ICD-10-CM

## 2018-10-17 DIAGNOSIS — M7062 Trochanteric bursitis, left hip: Secondary | ICD-10-CM | POA: Diagnosis not present

## 2018-10-17 MED ORDER — BUPIVACAINE HCL 0.25 % IJ SOLN
6.0000 mL | INTRAMUSCULAR | Status: AC | PRN
  Administered 2018-10-17: 6 mL via INTRA_ARTICULAR

## 2018-10-17 MED ORDER — TRIAMCINOLONE ACETONIDE 40 MG/ML IJ SUSP
40.0000 mg | INTRAMUSCULAR | Status: AC | PRN
  Administered 2018-10-17: 09:00:00 40 mg via INTRA_ARTICULAR

## 2018-10-17 MED ORDER — TRIAMCINOLONE ACETONIDE 40 MG/ML IJ SUSP
60.0000 mg | INTRAMUSCULAR | Status: AC | PRN
  Administered 2018-10-17: 60 mg via INTRA_ARTICULAR

## 2018-10-17 MED ORDER — BUPIVACAINE HCL 0.5 % IJ SOLN
3.0000 mL | INTRAMUSCULAR | Status: AC | PRN
  Administered 2018-10-17: 3 mL via INTRA_ARTICULAR

## 2018-10-17 NOTE — Progress Notes (Signed)
Barbara Patton - 78 y.o. female MRN [id number]  Date of birth: 02-02-1941  Office Visit Note: Visit Date: 10/17/2018 PCP: Seward Carol, MD Referred by: Seward Carol, MD  Subjective: Chief Complaint  Patient presents with  . Left Hip - Pain   HPI:  Barbara Patton is a 78 y.o. female who comes in today At the request of G. Alphonzo Severance for repeat left intra-articular hip injection and left greater trochanteric bursa injection.  Patient has a lot of left hip pain which is chronic and has been going on for a long time.  There is always been some question whether it was her back or hip.  She has multiple areas of pain in general including knees and shoulder etc.  She has seen other physicians in our practice at various occasions.  Last injection performed on the left hip as well as greater trochanter gave her almost 100% relief back in the first part of the year.  She did well for several months and the pain returned without trauma.  Interestingly she has this posterior buttock pain as well and agile see below that did get better with the intra-articular hip injection.  She might be a good candidate for hip replacement she will follow-up with Dr. Marlou Sa as needed.  ROS Otherwise per HPI.  Assessment & Plan: Visit Diagnoses:  1. Greater trochanteric bursitis, left   2. Pain in left hip     Plan: No additional findings.   Meds & Orders: No orders of the defined types were placed in this encounter.   Orders Placed This Encounter  Procedures  . Large Joint Inj: L hip joint  . Large Joint Inj: L greater trochanter  . XR C-ARM NO REPORT    Follow-up: Return if symptoms worsen or fail to improve.   Procedures: Large Joint Inj: L hip joint on 10/17/2018 8:52 AM Indications: diagnostic evaluation and pain Details: 22 G 3.5 in needle, fluoroscopy-guided anterior approach  Arthrogram: No  Medications: 3 mL bupivacaine 0.5 %; 60 mg triamcinolone acetonide 40 MG/ML Outcome: tolerated well,  no immediate complications  There was excellent flow of contrast producing a partial arthrogram of the hip. The patient did have relief of symptoms during the anesthetic phase of the injection. Procedure, treatment alternatives, risks and benefits explained, specific risks discussed. Consent was given by the patient. Immediately prior to procedure a time out was called to verify the correct patient, procedure, equipment, support staff and site/side marked as required. Patient was prepped and draped in the usual sterile fashion.   Large Joint Inj: L greater trochanter on 10/17/2018 8:52 AM Indications: pain and diagnostic evaluation Details: 22 G 3.5 in needle, fluoroscopy-guided lateral approach  Arthrogram: No  Medications: 40 mg triamcinolone acetonide 40 MG/ML; 6 mL bupivacaine 0.25 % Outcome: tolerated well, no immediate complications  There was excellent flow of contrast outlined the greater trochanteric bursa without vascular uptake. Procedure, treatment alternatives, risks and benefits explained, specific risks discussed. Consent was given by the patient. Immediately prior to procedure a time out was called to verify the correct patient, procedure, equipment, support staff and site/side marked as required. Patient was prepped and draped in the usual sterile fashion.      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 Lateral recess narrowing right more than left at L4-5. Far right lateral protrusion at L5-S1. No significant  central canal stenosis.     Objective:  VS:  HT:    WT:   BMI:     BP:   HR: bpm  TEMP: ( )  RESP:  Physical Exam Musculoskeletal:     Comments: Patient has left hip and groin pain with left hip rotation internally.  She does have some tenderness of the greater trochanter and gluteus medius.  She has good distal strength.     Ortho Exam  Imaging: Xr C-arm No Report  Result Date: 10/17/2018 Please see Notes tab for imaging impression.

## 2018-10-17 NOTE — Progress Notes (Signed)
 .  Numeric Pain Rating Scale and Functional Assessment Average Pain 8   In the last MONTH (on 0-10 scale) has pain interfered with the following?  1. General activity like being  able to carry out your everyday physical activities such as walking, climbing stairs, carrying groceries, or moving a chair?  Rating(10)   -Dye Allergies.

## 2018-10-23 DIAGNOSIS — I10 Essential (primary) hypertension: Secondary | ICD-10-CM | POA: Diagnosis not present

## 2018-10-23 DIAGNOSIS — E039 Hypothyroidism, unspecified: Secondary | ICD-10-CM | POA: Diagnosis not present

## 2018-10-23 DIAGNOSIS — E785 Hyperlipidemia, unspecified: Secondary | ICD-10-CM | POA: Diagnosis not present

## 2018-10-23 DIAGNOSIS — E7800 Pure hypercholesterolemia, unspecified: Secondary | ICD-10-CM | POA: Diagnosis not present

## 2018-10-25 DIAGNOSIS — Z1389 Encounter for screening for other disorder: Secondary | ICD-10-CM | POA: Diagnosis not present

## 2018-10-25 DIAGNOSIS — Z0000 Encounter for general adult medical examination without abnormal findings: Secondary | ICD-10-CM | POA: Diagnosis not present

## 2018-10-29 DIAGNOSIS — I10 Essential (primary) hypertension: Secondary | ICD-10-CM | POA: Diagnosis not present

## 2018-10-29 DIAGNOSIS — E7800 Pure hypercholesterolemia, unspecified: Secondary | ICD-10-CM | POA: Diagnosis not present

## 2018-10-29 DIAGNOSIS — E559 Vitamin D deficiency, unspecified: Secondary | ICD-10-CM | POA: Diagnosis not present

## 2018-10-29 DIAGNOSIS — E039 Hypothyroidism, unspecified: Secondary | ICD-10-CM | POA: Diagnosis not present

## 2018-11-01 DIAGNOSIS — E7800 Pure hypercholesterolemia, unspecified: Secondary | ICD-10-CM | POA: Diagnosis not present

## 2018-11-01 DIAGNOSIS — E559 Vitamin D deficiency, unspecified: Secondary | ICD-10-CM | POA: Diagnosis not present

## 2018-11-01 DIAGNOSIS — E039 Hypothyroidism, unspecified: Secondary | ICD-10-CM | POA: Diagnosis not present

## 2018-11-01 DIAGNOSIS — N951 Menopausal and female climacteric states: Secondary | ICD-10-CM | POA: Diagnosis not present

## 2018-11-01 DIAGNOSIS — E663 Overweight: Secondary | ICD-10-CM | POA: Diagnosis not present

## 2018-11-01 DIAGNOSIS — I10 Essential (primary) hypertension: Secondary | ICD-10-CM | POA: Diagnosis not present

## 2018-11-01 DIAGNOSIS — M329 Systemic lupus erythematosus, unspecified: Secondary | ICD-10-CM | POA: Diagnosis not present

## 2018-11-04 DIAGNOSIS — H34812 Central retinal vein occlusion, left eye, with macular edema: Secondary | ICD-10-CM | POA: Diagnosis not present

## 2018-11-04 DIAGNOSIS — H35371 Puckering of macula, right eye: Secondary | ICD-10-CM | POA: Diagnosis not present

## 2018-11-20 DIAGNOSIS — E8770 Fluid overload, unspecified: Secondary | ICD-10-CM | POA: Diagnosis not present

## 2018-11-20 DIAGNOSIS — R600 Localized edema: Secondary | ICD-10-CM | POA: Diagnosis not present

## 2018-11-27 DIAGNOSIS — M150 Primary generalized (osteo)arthritis: Secondary | ICD-10-CM | POA: Diagnosis not present

## 2018-11-27 DIAGNOSIS — M797 Fibromyalgia: Secondary | ICD-10-CM | POA: Diagnosis not present

## 2018-11-27 DIAGNOSIS — R5383 Other fatigue: Secondary | ICD-10-CM | POA: Diagnosis not present

## 2018-11-27 DIAGNOSIS — E669 Obesity, unspecified: Secondary | ICD-10-CM | POA: Diagnosis not present

## 2018-11-27 DIAGNOSIS — M5136 Other intervertebral disc degeneration, lumbar region: Secondary | ICD-10-CM | POA: Diagnosis not present

## 2018-11-27 DIAGNOSIS — E559 Vitamin D deficiency, unspecified: Secondary | ICD-10-CM | POA: Diagnosis not present

## 2018-11-27 DIAGNOSIS — H2011 Chronic iridocyclitis, right eye: Secondary | ICD-10-CM | POA: Diagnosis not present

## 2018-11-27 DIAGNOSIS — M329 Systemic lupus erythematosus, unspecified: Secondary | ICD-10-CM | POA: Diagnosis not present

## 2018-11-27 DIAGNOSIS — L299 Pruritus, unspecified: Secondary | ICD-10-CM | POA: Diagnosis not present

## 2018-12-05 DIAGNOSIS — R600 Localized edema: Secondary | ICD-10-CM | POA: Diagnosis not present

## 2018-12-05 DIAGNOSIS — R251 Tremor, unspecified: Secondary | ICD-10-CM | POA: Diagnosis not present

## 2018-12-05 DIAGNOSIS — E8770 Fluid overload, unspecified: Secondary | ICD-10-CM | POA: Diagnosis not present

## 2018-12-12 ENCOUNTER — Ambulatory Visit: Payer: Self-pay

## 2018-12-12 ENCOUNTER — Ambulatory Visit (INDEPENDENT_AMBULATORY_CARE_PROVIDER_SITE_OTHER): Payer: Medicare HMO | Admitting: Orthopedic Surgery

## 2018-12-12 ENCOUNTER — Other Ambulatory Visit (HOSPITAL_COMMUNITY): Payer: Self-pay | Admitting: Internal Medicine

## 2018-12-12 ENCOUNTER — Ambulatory Visit (INDEPENDENT_AMBULATORY_CARE_PROVIDER_SITE_OTHER): Payer: Medicare HMO

## 2018-12-12 ENCOUNTER — Encounter: Payer: Self-pay | Admitting: Orthopedic Surgery

## 2018-12-12 ENCOUNTER — Other Ambulatory Visit: Payer: Self-pay

## 2018-12-12 DIAGNOSIS — M25562 Pain in left knee: Secondary | ICD-10-CM

## 2018-12-12 DIAGNOSIS — M545 Low back pain, unspecified: Secondary | ICD-10-CM

## 2018-12-12 DIAGNOSIS — E8770 Fluid overload, unspecified: Secondary | ICD-10-CM

## 2018-12-12 DIAGNOSIS — M25552 Pain in left hip: Secondary | ICD-10-CM | POA: Diagnosis not present

## 2018-12-12 NOTE — Progress Notes (Signed)
Office Visit Note   Patient: Barbara Patton           Date of Birth: 03-Jul-1940           MRN: [id number] Visit Date: 12/12/2018 Requested by: Seward Carol, MD [address] [place],  [place] [address] PCP: Seward Carol, MD  Subjective: Chief Complaint  Patient presents with  . Left Knee - Pain    HPI: Pt is a 78 y.o. Female who presents to the clinic complaining of Lt knee pain.  Pt has had a L TKA and a subsequent revision of the L TKA approximately 5 years ago.  She reports L knee pain since the revision but her pain has worsened in the last couple weeks.  Denies any known injury to the knee.  Pain is located to in the medial and lateral knee, as well as behind the knee.  She also reports L groin pain and L lumbar back pain.  She has had L hip and L back injections by Dr. Ernestina Patches with improvement in her symptoms (most recent injection into the hip was on 10/17/18).                ROS: All systems reviewed are negative as they relate to the chief complaint within the history of present illness.  Patient denies  fevers or chills.   Assessment & Plan: Visit Diagnoses:  1. Left knee pain, unspecified chronicity     Plan:  Pt presents with a multitude of complaints. Her L knee has been experiencing pain since her revision years ago. The prosthesis appears stable according to X-ray and exam is not suspicious for infection.  She is having L groin pain with a history of back pain and hip pain that may be contributing to her L knee pain.  I recommended pt follow-up with Dr. Ernestina Patches for ESI's in her back followed by a L hip intra-articular injection further in the future, due to her recent hip injection in May 2020.  Pt agreed with plan and will follow-up with the office prn.    Follow-Up Instructions: No follow-ups on file.   Orders:  Orders Placed This Encounter  Procedures  . XR Knee 1-2 Views Left  . XR HIP UNILAT W OR W/O PELVIS 2-3 VIEWS LEFT   No orders of the  defined types were placed in this encounter.     Procedures: No procedures performed   Clinical Data: No additional findings.  Objective: Vital Signs: There were no vitals taken for this visit.  Physical Exam:   Constitutional: Patient appears well-developed HEENT:  Head: Normocephalic Eyes:EOM are normal Neck: Normal range of motion Cardiovascular: Normal rate Pulmonary/chest: Effort normal Neurologic: Patient is alert Skin: Skin is warm Psychiatric: Patient has normal mood and affect    Ortho Exam:  L knee exam Well-healed incision from previous knee procedure TTP over the medial and lateral jointline No TTP over patella, tibial tubercle, pes anserinus, quad or patellar tendons Extensor mechanism intact No effusion No significant increase in warmth compared to contralateral side   Pain in the L groin with IR of L hip TTP over the axial lumbar spine Negative SLR Back pain not worse with flexion/extension of spine  Specialty Comments:  No specialty comments available.  Imaging: No results found.   PMFS History: Patient Active Problem List   Diagnosis Date Noted  . 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 Referred otalgia of left ear 08/23/2015  . Temporomandibular joint (  TMJ) pain 08/23/2015  . Allergic rhinitis 07/26/2015  . Anxiety 07/26/2015  . Benign essential hypertension 07/26/2015  . Dysfunction of left eustachian tube 07/26/2015  . Esophageal reflux 07/26/2015  . Fibromyalgia 07/26/2015  . Pure hypercholesterolemia 07/26/2015  . 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 Pes anserinus bursitis of left knee 12/04/2013  . Trochanteric bursitis of left hip 08/07/2013  . Leg swelling 12/06/2011  . Glaucoma 11/02/2011  . HLD (hyperlipidemia) 11/02/2011  . HTN (hypertension) 11/02/2011  . Hypothyroid 11/02/2011  . OA (osteoarthritis) 11/02/2011  .  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 ([facility]) 03/16/2011  . Presence of artificial knee joint 03/16/2011   Past Medical History:  Diagnosis Date  . High cholesterol   . Hypertension   . Thyroid disease     History reviewed. No pertinent family history.  Past Surgical History:  Procedure Laterality Date  . JOINT REPLACEMENT     Social History   Occupational History  . Not on file  Tobacco Use  . Smoking status: Former Smoker    Quit date: 1997    Years since quitting: 23.5  . Smokeless tobacco: Never Used  Substance and Sexual Activity  . Alcohol use: Yes    Alcohol/week: 1.0 standard drinks    Types: 1 Glasses of wine per week    Comment: occ wine  . Drug use: No  . Sexual activity: Not on file

## 2018-12-13 DIAGNOSIS — E7800 Pure hypercholesterolemia, unspecified: Secondary | ICD-10-CM | POA: Diagnosis not present

## 2018-12-13 DIAGNOSIS — E785 Hyperlipidemia, unspecified: Secondary | ICD-10-CM | POA: Diagnosis not present

## 2018-12-13 DIAGNOSIS — I10 Essential (primary) hypertension: Secondary | ICD-10-CM | POA: Diagnosis not present

## 2018-12-13 DIAGNOSIS — E039 Hypothyroidism, unspecified: Secondary | ICD-10-CM | POA: Diagnosis not present

## 2018-12-16 ENCOUNTER — Ambulatory Visit (HOSPITAL_COMMUNITY): Payer: Medicare HMO | Attending: Internal Medicine

## 2018-12-16 ENCOUNTER — Other Ambulatory Visit: Payer: Self-pay

## 2018-12-16 DIAGNOSIS — E8770 Fluid overload, unspecified: Secondary | ICD-10-CM | POA: Diagnosis not present

## 2018-12-17 DIAGNOSIS — I5189 Other ill-defined heart diseases: Secondary | ICD-10-CM | POA: Diagnosis not present

## 2018-12-17 DIAGNOSIS — R600 Localized edema: Secondary | ICD-10-CM | POA: Diagnosis not present

## 2018-12-17 DIAGNOSIS — I10 Essential (primary) hypertension: Secondary | ICD-10-CM | POA: Diagnosis not present

## 2018-12-18 DIAGNOSIS — I10 Essential (primary) hypertension: Secondary | ICD-10-CM | POA: Diagnosis not present

## 2018-12-31 DIAGNOSIS — K219 Gastro-esophageal reflux disease without esophagitis: Secondary | ICD-10-CM | POA: Diagnosis not present

## 2018-12-31 DIAGNOSIS — J309 Allergic rhinitis, unspecified: Secondary | ICD-10-CM | POA: Diagnosis not present

## 2019-01-14 ENCOUNTER — Other Ambulatory Visit (INDEPENDENT_AMBULATORY_CARE_PROVIDER_SITE_OTHER): Payer: Self-pay | Admitting: Orthopedic Surgery

## 2019-01-14 NOTE — Telephone Encounter (Signed)
y

## 2019-01-14 NOTE — Telephone Encounter (Signed)
Ok to rf? 

## 2019-01-18 DIAGNOSIS — E039 Hypothyroidism, unspecified: Secondary | ICD-10-CM | POA: Diagnosis not present

## 2019-01-18 DIAGNOSIS — E7800 Pure hypercholesterolemia, unspecified: Secondary | ICD-10-CM | POA: Diagnosis not present

## 2019-01-18 DIAGNOSIS — E785 Hyperlipidemia, unspecified: Secondary | ICD-10-CM | POA: Diagnosis not present

## 2019-01-18 DIAGNOSIS — I10 Essential (primary) hypertension: Secondary | ICD-10-CM | POA: Diagnosis not present

## 2019-01-23 ENCOUNTER — Ambulatory Visit (INDEPENDENT_AMBULATORY_CARE_PROVIDER_SITE_OTHER): Payer: Medicare HMO | Admitting: Physical Medicine and Rehabilitation

## 2019-01-23 ENCOUNTER — Other Ambulatory Visit: Payer: Self-pay

## 2019-01-23 ENCOUNTER — Encounter: Payer: Self-pay | Admitting: Physical Medicine and Rehabilitation

## 2019-01-23 ENCOUNTER — Ambulatory Visit: Payer: Self-pay

## 2019-01-23 VITALS — BP 150/76 | HR 83

## 2019-01-23 DIAGNOSIS — M47816 Spondylosis without myelopathy or radiculopathy, lumbar region: Secondary | ICD-10-CM

## 2019-01-23 MED ORDER — METHYLPREDNISOLONE ACETATE 80 MG/ML IJ SUSP
80.0000 mg | Freq: Once | INTRAMUSCULAR | Status: AC
  Administered 2019-01-23: 80 mg

## 2019-01-23 NOTE — Progress Notes (Signed)
 .  Numeric Pain Rating Scale and Functional Assessment Average Pain 9   In the last MONTH (on 0-10 scale) has pain interfered with the following?  1. General activity like being  able to carry out your everyday physical activities such as walking, climbing stairs, carrying groceries, or moving a chair?  Rating(9)   +Driver, -BT, -Dye Allergies.  

## 2019-01-23 NOTE — Progress Notes (Signed)
Barbara Patton - 78 y.o. female MRN [id number]  Date of birth: 1940-09-28  Office Visit Note: Visit Date: 01/23/2019 PCP: Seward Carol, MD Referred by: Seward Carol, MD  Subjective: Chief Complaint  Patient presents with  . Lower Back - Pain  . Left Hip - Pain  . Left Leg - Pain   HPI:  Barbara Patton is a 78 y.o. female who comes in today For facet joint blocks of the lower spine.  Patient is very nice but complicated chronic pain patient.  She has a history of fibromyalgia.  She has history of pretty severe facet arthritis at L3-4 L4-5 in the lumbar spine.  We have not completed injections in the lumbar spine in quite some time because she ended up having pretty severe hip and groin pain over the last year.  We have completed several injections with good benefit.  These injections have been mostly a combination of intra-articular hip injection fluoroscopic guidance as well as greater trochanteric bursa injection.  She comes in today with both complaints of low back pain and left hip and groin pain.  Dr. Marlou Sa suggested back injections and holding off on the hip injections as the last injection was in May.  She reports that hip is probably worse than the back.  Had a long discussion with her today about fibromyalgia and arthritis pain of the lumbar spine as well as hip pain.  Wearing a complete diagnostic medial branch blocks today and try to get her to understand the difference during the back pain of the hip pain.  She clearly has 2 distinct problems.  If she gets relief of the back pain even temporarily would look at second set of blocks and a double block paradigm and may be radiofrequency ablation.  I will have her back in a couple weeks for hip injection.  Consideration should be given to hip replacement.  She will follow-up with Dr. Marlou Sa for her orthopedic care.  ROS Otherwise per HPI.  Assessment & Plan: Visit Diagnoses:  1. Spondylosis without myelopathy or radiculopathy, lumbar  region     Plan: No additional findings.   Meds & Orders:  Meds ordered this encounter  Medications  . methylPREDNISolone acetate (DEPO-MEDROL) injection 80 mg    Orders Placed This Encounter  Procedures  . Facet Injection  . XR C-ARM NO REPORT    Follow-up: No follow-ups on file.   Procedures: No procedures performed  Lumbar Diagnostic Facet Joint Nerve Block with Fluoroscopic Guidance   Patient: Barbara Patton      Date of Birth: May 22, 1941 MRN: [id number] PCP: Seward Carol, MD      Visit Date: 01/23/2019   Universal Protocol:    Date/Time: 01/23/2011:58 PM  Consent Given By: the patient  Position: PRONE  Additional Comments: Vital signs were monitored before and after the procedure. Patient was prepped and draped in the usual sterile fashion. The correct patient, procedure, and site was verified.   Injection Procedure Details:  Procedure Site One Meds Administered:  Meds ordered this encounter  Medications  . methylPREDNISolone acetate (DEPO-MEDROL) injection 80 mg     Laterality: Bilateral  Location/Site:  L3-L4 L4-L5  Needle size: 22 ga.  Needle type:spinal  Needle Placement: Oblique pedical  Findings:   -Comments: There was excellent flow of contrast along the articular pillars without intravascular flow.  Procedure Details: The fluoroscope beam is vertically oriented in AP and then obliqued 15 to 20 degrees to the ipsilateral side of the desired  nerve to achieve the "Scotty dog" appearance.  The skin over the target area of the junction of the superior articulating process and the transverse process (sacral ala if blocking the L5 dorsal rami) was locally anesthetized with a 1 ml volume of 1% Lidocaine without Epinephrine.  The spinal needle was inserted and advanced in a trajectory view down to the target.   After contact with periosteum and negative aspirate for blood and CSF, correct placement without intravascular or epidural spread was  confirmed by injecting 0.5 ml. of Isovue-250.  A spot radiograph was obtained of this image.    Next, a 0.5 ml. volume of the injectate described above was injected. The needle was then redirected to the other facet joint nerves mentioned above if needed.  Prior to the procedure, the patient was given a Pain Diary which was completed for baseline measurements.  After the procedure, the patient rated their pain every 30 minutes and will continue rating at this frequency for a total of 5 hours.  The patient has been asked to complete the Diary and return to [place] by mail, fax or hand delivered as soon as possible.   Additional Comments:  The patient tolerated the procedure well Dressing: 2 x 2 sterile gauze and Band-Aid    Post-procedure details: Patient was observed during the procedure. Post-procedure instructions were reviewed.  Patient left the clinic in stable condition.   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 Lateral recess narrowing right more than left at L4-5. Far right lateral protrusion at L5-S1. No significant central canal stenosis.     Objective:  VS:  HT:    WT:   BMI:     BP:(!) 150/76  HR:83bpm  TEMP: ( )  RESP:  Physical Exam  Ortho Exam Imaging: Xr C-arm No Report  Result Date: 01/23/2019 Please see Notes tab for imaging impression.

## 2019-01-23 NOTE — Procedures (Signed)
Lumbar Diagnostic Facet Joint Nerve Block with Fluoroscopic Guidance   Patient: Barbara Patton      Date of Birth: [birth year] MRN: [id number] PCP: Seward Carol, MD      Visit Date: 01/23/2019   Universal Protocol:    Date/Time: 01/23/2011:58 PM  Consent Given By: the patient  Position: PRONE  Additional Comments: Vital signs were monitored before and after the procedure. Patient was prepped and draped in the usual sterile fashion. The correct patient, procedure, and site was verified.   Injection Procedure Details:  Procedure Site One Meds Administered:  Meds ordered this encounter  Medications  . methylPREDNISolone acetate (DEPO-MEDROL) injection 80 mg     Laterality: Bilateral  Location/Site:  L3-L4 L4-L5  Needle size: 22 ga.  Needle type:spinal  Needle Placement: Oblique pedical  Findings:   -Comments: There was excellent flow of contrast along the articular pillars without intravascular flow.  Procedure Details: The fluoroscope beam is vertically oriented in AP and then obliqued 15 to 20 degrees to the ipsilateral side of the desired nerve to achieve the "Scotty dog" appearance.  The skin over the target area of the junction of the superior articulating process and the transverse process (sacral ala if blocking the L5 dorsal rami) was locally anesthetized with a 1 ml volume of 1% Lidocaine without Epinephrine.  The spinal needle was inserted and advanced in a trajectory view down to the target.   After contact with periosteum and negative aspirate for blood and CSF, correct placement without intravascular or epidural spread was confirmed by injecting 0.5 ml. of Isovue-250.  A spot radiograph was obtained of this image.    Next, a 0.5 ml. volume of the injectate described above was injected. The needle was then redirected to the other facet joint nerves mentioned above if needed.  Prior to the procedure, the patient was given a Pain Diary which was completed  for baseline measurements.  After the procedure, the patient rated their pain every 30 minutes and will continue rating at this frequency for a total of 5 hours.  The patient has been asked to complete the Diary and return to [place] by mail, fax or hand delivered as soon as possible.   Additional Comments:  The patient tolerated the procedure well Dressing: 2 x 2 sterile gauze and Band-Aid    Post-procedure details: Patient was observed during the procedure. Post-procedure instructions were reviewed.  Patient left the clinic in stable condition.

## 2019-01-28 DIAGNOSIS — Z79899 Other long term (current) drug therapy: Secondary | ICD-10-CM | POA: Diagnosis not present

## 2019-01-28 DIAGNOSIS — Z961 Presence of intraocular lens: Secondary | ICD-10-CM | POA: Diagnosis not present

## 2019-01-28 DIAGNOSIS — H401132 Primary open-angle glaucoma, bilateral, moderate stage: Secondary | ICD-10-CM | POA: Diagnosis not present

## 2019-02-03 DIAGNOSIS — H43813 Vitreous degeneration, bilateral: Secondary | ICD-10-CM | POA: Diagnosis not present

## 2019-02-03 DIAGNOSIS — H43392 Other vitreous opacities, left eye: Secondary | ICD-10-CM | POA: Diagnosis not present

## 2019-02-03 DIAGNOSIS — H34812 Central retinal vein occlusion, left eye, with macular edema: Secondary | ICD-10-CM | POA: Diagnosis not present

## 2019-02-03 DIAGNOSIS — H35371 Puckering of macula, right eye: Secondary | ICD-10-CM | POA: Diagnosis not present

## 2019-02-05 DIAGNOSIS — Z6830 Body mass index (BMI) 30.0-30.9, adult: Secondary | ICD-10-CM | POA: Diagnosis not present

## 2019-02-05 DIAGNOSIS — R5383 Other fatigue: Secondary | ICD-10-CM | POA: Diagnosis not present

## 2019-02-05 DIAGNOSIS — M797 Fibromyalgia: Secondary | ICD-10-CM | POA: Diagnosis not present

## 2019-02-05 DIAGNOSIS — L299 Pruritus, unspecified: Secondary | ICD-10-CM | POA: Diagnosis not present

## 2019-02-05 DIAGNOSIS — H2011 Chronic iridocyclitis, right eye: Secondary | ICD-10-CM | POA: Diagnosis not present

## 2019-02-05 DIAGNOSIS — M5136 Other intervertebral disc degeneration, lumbar region: Secondary | ICD-10-CM | POA: Diagnosis not present

## 2019-02-05 DIAGNOSIS — M150 Primary generalized (osteo)arthritis: Secondary | ICD-10-CM | POA: Diagnosis not present

## 2019-02-05 DIAGNOSIS — E559 Vitamin D deficiency, unspecified: Secondary | ICD-10-CM | POA: Diagnosis not present

## 2019-02-05 DIAGNOSIS — M329 Systemic lupus erythematosus, unspecified: Secondary | ICD-10-CM | POA: Diagnosis not present

## 2019-02-06 ENCOUNTER — Ambulatory Visit: Payer: Medicare HMO

## 2019-02-06 ENCOUNTER — Ambulatory Visit (INDEPENDENT_AMBULATORY_CARE_PROVIDER_SITE_OTHER): Payer: Medicare HMO | Admitting: Physical Medicine and Rehabilitation

## 2019-02-06 ENCOUNTER — Encounter: Payer: Self-pay | Admitting: Physical Medicine and Rehabilitation

## 2019-02-06 ENCOUNTER — Other Ambulatory Visit: Payer: Self-pay

## 2019-02-06 DIAGNOSIS — M25552 Pain in left hip: Secondary | ICD-10-CM

## 2019-02-06 NOTE — Progress Notes (Signed)
 .  Numeric Pain Rating Scale and Functional Assessment Average Pain 8   In the last MONTH (on 0-10 scale) has pain interfered with the following?  1. General activity like being  able to carry out your everyday physical activities such as walking, climbing stairs, carrying groceries, or moving a chair?  Rating(6)   -Dye Allergies.  

## 2019-02-06 NOTE — Progress Notes (Signed)
   Barbara Patton - 78 y.o. female MRN [id number]  Date of birth: [birth year]  Office Visit Note: Visit Date: 02/06/2019 PCP: Seward Carol, MD Referred by: Seward Carol, MD  Subjective: Chief Complaint  Patient presents with  . Left Hip - Pain   HPI:  Barbara Patton is a 78 y.o. female who comes in today For planned left intra-articular hip injection at the request of Dr. Anderson Malta.  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  She is also followed by Dr. Amil Amen from a rheumatology standpoint.  Her case is complicated by multiple pain generators and orthopedic complaints and history of fibromyalgia.  We were holding off on left hip injection at the request of Dr. Marlou Sa to space those out over the course of a year but unfortunately the physician assistant at Dr. Melissa Noon office gave her a greater trochanteric injection in the office just a few days ago.  We are going to go ahead today since we had planned on doing getting complete the injection today and I really warned the patient that she really needs to be an advocate for herself and try to limit the amount of cortisone injections.  At least in our office were able to handle that to a degree.  ROS Otherwise per HPI.  Assessment & Plan: Visit Diagnoses:  1. Pain in left hip     Plan: No additional findings.   Meds & Orders: No orders of the defined types were placed in this encounter.   Orders Placed This Encounter  Procedures  . Large Joint Inj: L hip joint  . XR C-ARM NO REPORT    Follow-up: No follow-ups on file.   Procedures: Large Joint Inj: L hip joint on 02/06/2019 3:16 PM Indications: diagnostic evaluation and pain Details: 22 G 3.5 in needle, fluoroscopy-guided anterior approach  Arthrogram: No  Medications: 80 mg triamcinolone acetonide 40 MG/ML; 4 mL bupivacaine 0.25 % Outcome: tolerated well,  no immediate complications  There was excellent flow of contrast producing a partial arthrogram of the hip. The patient did have relief of symptoms during the anesthetic phase of the injection. Procedure, treatment alternatives, risks and benefits explained, specific risks discussed. Consent was given by the patient. Immediately prior to procedure a time out was called to verify the correct patient, procedure, equipment, support staff and site/side marked as required. Patient was prepped and draped in the usual sterile fashion.      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 Lateral recess narrowing right more than left at L4-5. Far right lateral protrusion at L5-S1. No significant central canal stenosis.     Objective:  VS:  HT:    WT:   BMI:     BP:   HR: bpm  TEMP: ( )  RESP:  Physical Exam  Ortho Exam Imaging: Xr C-arm No Report  Result Date: 02/06/2019 Please see Notes tab for imaging impression.

## 2019-02-07 MED ORDER — BUPIVACAINE HCL 0.25 % IJ SOLN
4.0000 mL | INTRAMUSCULAR | Status: AC | PRN
  Administered 2019-02-06: 4 mL via INTRA_ARTICULAR

## 2019-02-07 MED ORDER — TRIAMCINOLONE ACETONIDE 40 MG/ML IJ SUSP
80.0000 mg | INTRAMUSCULAR | Status: AC | PRN
  Administered 2019-02-06: 15:00:00 80 mg via INTRA_ARTICULAR

## 2019-03-04 DIAGNOSIS — R682 Dry mouth, unspecified: Secondary | ICD-10-CM | POA: Diagnosis not present

## 2019-03-04 DIAGNOSIS — J309 Allergic rhinitis, unspecified: Secondary | ICD-10-CM | POA: Diagnosis not present

## 2019-03-04 DIAGNOSIS — Z7289 Other problems related to lifestyle: Secondary | ICD-10-CM | POA: Diagnosis not present

## 2019-03-04 DIAGNOSIS — Z87891 Personal history of nicotine dependence: Secondary | ICD-10-CM | POA: Diagnosis not present

## 2019-03-04 DIAGNOSIS — K219 Gastro-esophageal reflux disease without esophagitis: Secondary | ICD-10-CM | POA: Diagnosis not present

## 2019-03-05 DIAGNOSIS — M329 Systemic lupus erythematosus, unspecified: Secondary | ICD-10-CM | POA: Diagnosis not present

## 2019-03-05 DIAGNOSIS — M797 Fibromyalgia: Secondary | ICD-10-CM | POA: Diagnosis not present

## 2019-03-05 DIAGNOSIS — E559 Vitamin D deficiency, unspecified: Secondary | ICD-10-CM | POA: Diagnosis not present

## 2019-03-05 DIAGNOSIS — R5383 Other fatigue: Secondary | ICD-10-CM | POA: Diagnosis not present

## 2019-03-05 DIAGNOSIS — H2011 Chronic iridocyclitis, right eye: Secondary | ICD-10-CM | POA: Diagnosis not present

## 2019-03-05 DIAGNOSIS — L299 Pruritus, unspecified: Secondary | ICD-10-CM | POA: Diagnosis not present

## 2019-03-05 DIAGNOSIS — E669 Obesity, unspecified: Secondary | ICD-10-CM | POA: Diagnosis not present

## 2019-03-05 DIAGNOSIS — M150 Primary generalized (osteo)arthritis: Secondary | ICD-10-CM | POA: Diagnosis not present

## 2019-03-05 DIAGNOSIS — M5136 Other intervertebral disc degeneration, lumbar region: Secondary | ICD-10-CM | POA: Diagnosis not present

## 2019-03-14 DIAGNOSIS — Z1231 Encounter for screening mammogram for malignant neoplasm of breast: Secondary | ICD-10-CM | POA: Diagnosis not present

## 2019-03-17 ENCOUNTER — Encounter: Payer: Self-pay | Admitting: Orthopedic Surgery

## 2019-03-17 ENCOUNTER — Ambulatory Visit (INDEPENDENT_AMBULATORY_CARE_PROVIDER_SITE_OTHER): Payer: Medicare HMO

## 2019-03-17 ENCOUNTER — Other Ambulatory Visit: Payer: Self-pay

## 2019-03-17 ENCOUNTER — Ambulatory Visit (INDEPENDENT_AMBULATORY_CARE_PROVIDER_SITE_OTHER): Payer: 59 | Admitting: Orthopedic Surgery

## 2019-03-17 DIAGNOSIS — M12811 Other specific arthropathies, not elsewhere classified, right shoulder: Secondary | ICD-10-CM | POA: Diagnosis not present

## 2019-03-19 DIAGNOSIS — M12811 Other specific arthropathies, not elsewhere classified, right shoulder: Secondary | ICD-10-CM

## 2019-03-19 NOTE — Progress Notes (Signed)
Office Visit Note   Patient: Barbara Patton           Date of Birth: 11-28-1940           MRN: [id number] Visit Date: 03/17/2019 Requested by: Seward Carol, MD [address] [place],  [place] [address] PCP: Seward Carol, MD  Subjective: Chief Complaint  Patient presents with  . Right Shoulder - Pain    HPI: Barbara Patton is a 78 y.o. female who presents to the office complaining of right shoulder pain.  Patient returns to clinic following previous right shoulder injection on 09/25/2018.  Patient notes right shoulder pain that radiates down to her right elbow.  She notes pain is worse with overhead motion.  Is difficult to sleep on the right side at night.  She denies any neck pain/radicular symptoms/numbness/tingling.  She uses CBD oil on the right arm with some relief as well as takes tramadol and Tylenol.  She had great relief from the previous right shoulder injection in April of this year..                ROS:  All systems reviewed are negative as they relate to the chief complaint within the history of present illness.  Patient denies fevers or chills.  Assessment & Plan: Visit Diagnoses:  1. Rotator cuff arthropathy of right shoulder     Plan: Patient is a 78 year old female who presents complaining of chronic right shoulder pain.  Her pain is worse with overhead motion and with sleeping on the right side.  She has been seen previously for right shoulder rotator cuff arthropathy and bursitis.  She had great relief from her last right shoulder injection.  Another right shoulder injection was offered today in the clinic and patient accepted.  Patient tolerated the procedure well and she will follow-up PRN.  She may have about 3 injections/year.  Patient not interested in reverse shoulder replacement at this time but that may potentially be in her future if her symptoms progress and functionality diminish  Follow-Up Instructions: No follow-ups on file.   Orders:   Orders Placed This Encounter  Procedures  . XR Shoulder Right   No orders of the defined types were placed in this encounter.     Procedures: Large Joint Inj: R subacromial bursa on 03/19/2019 7:10 PM Indications: diagnostic evaluation and pain Details: 18 G 1.5 in needle, posterior approach  Arthrogram: No  Medications: 9 mL bupivacaine 0.5 %; 5 mL lidocaine 1 %; 40 mg methylPREDNISolone acetate 40 MG/ML Outcome: tolerated well, no immediate complications Procedure, treatment alternatives, risks and benefits explained, specific risks discussed. Consent was given by the patient. Immediately prior to procedure a time out was called to verify the correct patient, procedure, equipment, support staff and site/side marked as required. Patient was prepped and draped in the usual sterile fashion.       Clinical Data: No additional findings.  Objective: Vital Signs: There were no vitals taken for this visit.  Physical Exam:  Constitutional: Patient appears well-developed HEENT:  Head: Normocephalic Eyes:EOM are normal Neck: Normal range of motion Cardiovascular: Normal rate Pulmonary/chest: Effort normal Neurologic: Patient is alert Skin: Skin is warm Psychiatric: Patient has normal mood and affect  Ortho Exam:  Right shoulder Exam Able to fully forward flex and abduct shoulder overhead No loss of ER relative to the other shoulder.  Good endpoint with ER No TTP over the [facility] joint or bicipital groove 5/5 motor strength of the  subscapularis and supraspinatus muscles Marked weakness of the infraspinatus compared to the contralateral side Positive Hawkins impingement 5/5 grip strength, forearm pronation/supination, and bicep strength  Specialty Comments:  No specialty comments available.  Imaging: No results found.   PMFS History: Patient Active Problem List   Diagnosis Date Noted  . 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 Referred otalgia of left ear  08/23/2015  . 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 Allergic rhinitis 07/26/2015  . Anxiety 07/26/2015  . Benign essential hypertension 07/26/2015  . Dysfunction of left eustachian tube 07/26/2015  . Esophageal reflux 07/26/2015  . Fibromyalgia 07/26/2015  . Pure hypercholesterolemia 07/26/2015  . 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 Pes anserinus bursitis of left knee 12/04/2013  . Trochanteric bursitis of left hip 08/07/2013  . Leg swelling 12/06/2011  . Glaucoma 11/02/2011  . HLD (hyperlipidemia) 11/02/2011  . HTN (hypertension) 11/02/2011  . Hypothyroid 11/02/2011  . OA (osteoarthritis) 11/02/2011  . 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 ([place]) 03/16/2011  . Presence of artificial knee joint 03/16/2011   Past Medical History:  Diagnosis Date  . High cholesterol   . Hypertension   . Thyroid disease     History reviewed. No pertinent family history.  Past Surgical History:  Procedure Laterality Date  . JOINT REPLACEMENT     Social History   Occupational History  . Not on file  Tobacco Use  . Smoking status: Former Smoker    Quit date: 1997    Years since quitting: 23.8  . Smokeless tobacco: Never Used  Substance and Sexual Activity  . Alcohol use: Yes    Alcohol/week: 1.0 standard drinks    Types: 1 Glasses of wine per week    Comment: occ wine  . Drug use: No  . Sexual activity: Not on file

## 2019-03-22 ENCOUNTER — Encounter: Payer: Self-pay | Admitting: Orthopedic Surgery

## 2019-03-22 MED ORDER — METHYLPREDNISOLONE ACETATE 40 MG/ML IJ SUSP
40.0000 mg | INTRAMUSCULAR | Status: AC | PRN
  Administered 2019-03-19: 19:00:00 40 mg via INTRA_ARTICULAR

## 2019-03-22 MED ORDER — LIDOCAINE HCL 1 % IJ SOLN
5.0000 mL | INTRAMUSCULAR | Status: AC | PRN
  Administered 2019-03-19: 5 mL

## 2019-03-22 MED ORDER — BUPIVACAINE HCL 0.5 % IJ SOLN
9.0000 mL | INTRAMUSCULAR | Status: AC | PRN
  Administered 2019-03-19: 19:00:00 9 mL via INTRA_ARTICULAR

## 2019-03-24 DIAGNOSIS — B351 Tinea unguium: Secondary | ICD-10-CM | POA: Diagnosis not present

## 2019-03-24 DIAGNOSIS — E7800 Pure hypercholesterolemia, unspecified: Secondary | ICD-10-CM | POA: Diagnosis not present

## 2019-03-24 DIAGNOSIS — M79672 Pain in left foot: Secondary | ICD-10-CM | POA: Diagnosis not present

## 2019-03-24 DIAGNOSIS — M65871 Other synovitis and tenosynovitis, right ankle and foot: Secondary | ICD-10-CM | POA: Diagnosis not present

## 2019-03-24 DIAGNOSIS — E039 Hypothyroidism, unspecified: Secondary | ICD-10-CM | POA: Diagnosis not present

## 2019-03-24 DIAGNOSIS — M79671 Pain in right foot: Secondary | ICD-10-CM | POA: Diagnosis not present

## 2019-03-24 DIAGNOSIS — M7989 Other specified soft tissue disorders: Secondary | ICD-10-CM | POA: Diagnosis not present

## 2019-03-24 DIAGNOSIS — M19071 Primary osteoarthritis, right ankle and foot: Secondary | ICD-10-CM | POA: Diagnosis not present

## 2019-03-24 DIAGNOSIS — L600 Ingrowing nail: Secondary | ICD-10-CM | POA: Diagnosis not present

## 2019-03-24 DIAGNOSIS — I10 Essential (primary) hypertension: Secondary | ICD-10-CM | POA: Diagnosis not present

## 2019-03-24 DIAGNOSIS — M25571 Pain in right ankle and joints of right foot: Secondary | ICD-10-CM | POA: Diagnosis not present

## 2019-03-24 DIAGNOSIS — E785 Hyperlipidemia, unspecified: Secondary | ICD-10-CM | POA: Diagnosis not present

## 2019-04-02 DIAGNOSIS — H401132 Primary open-angle glaucoma, bilateral, moderate stage: Secondary | ICD-10-CM | POA: Diagnosis not present

## 2019-04-07 DIAGNOSIS — M25571 Pain in right ankle and joints of right foot: Secondary | ICD-10-CM | POA: Diagnosis not present

## 2019-04-07 DIAGNOSIS — L600 Ingrowing nail: Secondary | ICD-10-CM | POA: Diagnosis not present

## 2019-04-09 DIAGNOSIS — H524 Presbyopia: Secondary | ICD-10-CM | POA: Diagnosis not present

## 2019-04-09 DIAGNOSIS — H52223 Regular astigmatism, bilateral: Secondary | ICD-10-CM | POA: Diagnosis not present

## 2019-04-21 DIAGNOSIS — L600 Ingrowing nail: Secondary | ICD-10-CM | POA: Diagnosis not present

## 2019-04-21 DIAGNOSIS — B353 Tinea pedis: Secondary | ICD-10-CM | POA: Diagnosis not present

## 2019-04-21 DIAGNOSIS — M79671 Pain in right foot: Secondary | ICD-10-CM | POA: Diagnosis not present

## 2019-04-21 DIAGNOSIS — Z86010 Personal history of colonic polyps: Secondary | ICD-10-CM | POA: Diagnosis not present

## 2019-04-21 DIAGNOSIS — K219 Gastro-esophageal reflux disease without esophagitis: Secondary | ICD-10-CM | POA: Diagnosis not present

## 2019-04-21 DIAGNOSIS — B351 Tinea unguium: Secondary | ICD-10-CM | POA: Diagnosis not present

## 2019-04-21 DIAGNOSIS — M79675 Pain in left toe(s): Secondary | ICD-10-CM | POA: Diagnosis not present

## 2019-04-29 DIAGNOSIS — R5383 Other fatigue: Secondary | ICD-10-CM | POA: Diagnosis not present

## 2019-04-29 DIAGNOSIS — E039 Hypothyroidism, unspecified: Secondary | ICD-10-CM | POA: Diagnosis not present

## 2019-04-29 DIAGNOSIS — E7800 Pure hypercholesterolemia, unspecified: Secondary | ICD-10-CM | POA: Diagnosis not present

## 2019-04-29 DIAGNOSIS — E559 Vitamin D deficiency, unspecified: Secondary | ICD-10-CM | POA: Diagnosis not present

## 2019-04-29 DIAGNOSIS — I10 Essential (primary) hypertension: Secondary | ICD-10-CM | POA: Diagnosis not present

## 2019-04-29 DIAGNOSIS — I5189 Other ill-defined heart diseases: Secondary | ICD-10-CM | POA: Diagnosis not present

## 2019-04-29 DIAGNOSIS — M329 Systemic lupus erythematosus, unspecified: Secondary | ICD-10-CM | POA: Diagnosis not present

## 2019-05-05 DIAGNOSIS — L600 Ingrowing nail: Secondary | ICD-10-CM | POA: Diagnosis not present

## 2019-05-12 DIAGNOSIS — L03031 Cellulitis of right toe: Secondary | ICD-10-CM | POA: Diagnosis not present

## 2019-05-12 DIAGNOSIS — Z1629 Resistance to other single specified antibiotic: Secondary | ICD-10-CM | POA: Diagnosis not present

## 2019-05-15 DIAGNOSIS — H35033 Hypertensive retinopathy, bilateral: Secondary | ICD-10-CM | POA: Diagnosis not present

## 2019-05-15 DIAGNOSIS — H34812 Central retinal vein occlusion, left eye, with macular edema: Secondary | ICD-10-CM | POA: Diagnosis not present

## 2019-05-15 DIAGNOSIS — H43813 Vitreous degeneration, bilateral: Secondary | ICD-10-CM | POA: Diagnosis not present

## 2019-05-15 DIAGNOSIS — H35373 Puckering of macula, bilateral: Secondary | ICD-10-CM | POA: Diagnosis not present

## 2019-05-19 DIAGNOSIS — L600 Ingrowing nail: Secondary | ICD-10-CM | POA: Diagnosis not present

## 2019-05-19 DIAGNOSIS — Z1159 Encounter for screening for other viral diseases: Secondary | ICD-10-CM | POA: Diagnosis not present

## 2019-05-19 DIAGNOSIS — M79675 Pain in left toe(s): Secondary | ICD-10-CM | POA: Diagnosis not present

## 2019-05-19 DIAGNOSIS — B351 Tinea unguium: Secondary | ICD-10-CM | POA: Diagnosis not present

## 2019-05-19 DIAGNOSIS — M79674 Pain in right toe(s): Secondary | ICD-10-CM | POA: Diagnosis not present

## 2019-05-22 DIAGNOSIS — K640 First degree hemorrhoids: Secondary | ICD-10-CM | POA: Diagnosis not present

## 2019-05-22 DIAGNOSIS — Z86010 Personal history of colonic polyps: Secondary | ICD-10-CM | POA: Diagnosis not present

## 2019-05-28 ENCOUNTER — Telehealth: Payer: Self-pay | Admitting: *Deleted

## 2019-05-28 NOTE — Telephone Encounter (Signed)
Please see message below. Pt states she received 80% of relief from last injection in September 2020. Ok to repeat?

## 2019-05-28 NOTE — Telephone Encounter (Signed)
Ok

## 2019-05-28 NOTE — Telephone Encounter (Signed)
Patient called. She wants to know when she can receive another shot in her groin and hip.   Call back number: [phone]

## 2019-05-28 NOTE — Telephone Encounter (Signed)
Pt is scheduled for 06/17/2019 for rpt hip.

## 2019-06-04 DIAGNOSIS — M25571 Pain in right ankle and joints of right foot: Secondary | ICD-10-CM | POA: Diagnosis not present

## 2019-06-04 DIAGNOSIS — M7751 Other enthesopathy of right foot: Secondary | ICD-10-CM | POA: Diagnosis not present

## 2019-06-16 DIAGNOSIS — M79674 Pain in right toe(s): Secondary | ICD-10-CM | POA: Diagnosis not present

## 2019-06-16 DIAGNOSIS — M79675 Pain in left toe(s): Secondary | ICD-10-CM | POA: Diagnosis not present

## 2019-06-16 DIAGNOSIS — B351 Tinea unguium: Secondary | ICD-10-CM | POA: Diagnosis not present

## 2019-06-16 DIAGNOSIS — M25571 Pain in right ankle and joints of right foot: Secondary | ICD-10-CM | POA: Diagnosis not present

## 2019-06-16 DIAGNOSIS — L600 Ingrowing nail: Secondary | ICD-10-CM | POA: Diagnosis not present

## 2019-06-19 ENCOUNTER — Encounter: Payer: Self-pay | Admitting: Physical Medicine and Rehabilitation

## 2019-06-19 ENCOUNTER — Ambulatory Visit (INDEPENDENT_AMBULATORY_CARE_PROVIDER_SITE_OTHER): Payer: Medicare HMO | Admitting: Physical Medicine and Rehabilitation

## 2019-06-19 ENCOUNTER — Other Ambulatory Visit: Payer: Self-pay

## 2019-06-19 ENCOUNTER — Ambulatory Visit: Payer: Self-pay

## 2019-06-19 DIAGNOSIS — M25552 Pain in left hip: Secondary | ICD-10-CM | POA: Diagnosis not present

## 2019-06-19 NOTE — Progress Notes (Signed)
   Barbara Patton - 79 y.o. female MRN [id number]  Date of birth: 1941/01/18  Office Visit Note: Visit Date: 06/19/2019 PCP: Seward Carol, MD Referred by: Seward Carol, MD  Subjective: Chief Complaint  Patient presents with  . Left Hip - Pain   HPI:  Barbara Patton is a 79 y.o. female who comes in today Here for repeat left intra-articular hip injection.  Prior injection was very beneficial.  She is having hip and groin pain.  ROS Otherwise per HPI.  Assessment & Plan: Visit Diagnoses: No diagnosis found.  Plan: No additional findings.   Meds & Orders: No orders of the defined types were placed in this encounter.  No orders of the defined types were placed in this encounter.   Follow-up: No follow-ups on file.   Procedures: Large Joint Inj: L hip joint on 06/19/2019 2:11 PM Indications: diagnostic evaluation and pain Details: 22 G 3.5 in needle, fluoroscopy-guided anterior approach  Arthrogram: No  Medications: 80 mg triamcinolone acetonide 40 MG/ML; 4 mL bupivacaine 0.25 % Outcome: tolerated well, no immediate complications  There was excellent flow of contrast producing a partial arthrogram of the hip. The patient did have relief of symptoms during the anesthetic phase of the injection. Procedure, treatment alternatives, risks and benefits explained, specific risks discussed. Consent was given by the patient. Immediately prior to procedure a time out was called to verify the correct patient, procedure, equipment, support staff and site/side marked as required. Patient was prepped and draped in the usual sterile fashion.      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 Lateral recess narrowing right more than left at L4-5. Far right lateral protrusion at L5-S1. No significant central canal stenosis.     Objective:  VS:  HT:    WT:   BMI:     BP:    HR: bpm  TEMP: ( )  RESP:  Physical Exam  Ortho Exam Imaging: No results found.

## 2019-06-19 NOTE — Progress Notes (Signed)
Patient complains today of left hip pain. Pt states pain is in the groin and bursa area. Patient states the injection helps pain. Patient states she received 70% relief from pain for 3 months. Pt. States nothing specific makes pain worse. Patient states pain is only worse when standing. States no pain when sitting    Numeric Pain Rating Scale and Functional Assessment Average Pain (6)   In the last MONTH (on 0-10 scale) has pain interfered with the following?  1. General activity like being  able to carry out your everyday physical activities such as walking, climbing stairs, carrying groceries, or moving a chair?  Rating( 7)   +Driver, -BT, -Dye Allergies.

## 2019-06-25 DIAGNOSIS — Z20828 Contact with and (suspected) exposure to other viral communicable diseases: Secondary | ICD-10-CM | POA: Diagnosis not present

## 2019-07-18 ENCOUNTER — Ambulatory Visit (INDEPENDENT_AMBULATORY_CARE_PROVIDER_SITE_OTHER): Payer: Medicare HMO | Admitting: Physical Medicine and Rehabilitation

## 2019-07-18 ENCOUNTER — Other Ambulatory Visit: Payer: Self-pay

## 2019-07-18 ENCOUNTER — Encounter: Payer: Self-pay | Admitting: Physical Medicine and Rehabilitation

## 2019-07-18 ENCOUNTER — Ambulatory Visit: Payer: Self-pay

## 2019-07-18 DIAGNOSIS — M25552 Pain in left hip: Secondary | ICD-10-CM | POA: Diagnosis not present

## 2019-07-18 NOTE — Progress Notes (Signed)
  Numeric Pain Rating Scale and Functional Assessment Average Pain 10   In the last MONTH (on 0-10 scale) has pain interfered with the following?  1. General activity like being  able to carry out your everyday physical activities such as walking, climbing stairs, carrying groceries, or moving a chair?  Rating(5)   -Dye Allergies.  

## 2019-07-18 NOTE — Progress Notes (Signed)
   Barbara Patton - 79 y.o. female MRN [id number]  Date of birth: [birth year]  Office Visit Note: Visit Date: 07/18/2019 PCP: Seward Carol, MD Referred by: Seward Carol, MD  Subjective: Chief Complaint  Patient presents with  . Left Hip - Pain   HPI:  Barbara Patton is a 79 y.o. female who comes in today For planned repeat left intra-articular hip injection.  She had prior injection in January that did not seem to last very long but flow contrast was okay and I think it is wise to go ahead today and repeated since she typically does much better than that.  Prior injection before that was back in the fall of last year.  She does however get injections in the shoulder as well as greater trochanteric injections by her rheumatologist.  She will follow up with Dr. Marlou Sa.  ROS Otherwise per HPI.  Assessment & Plan: Visit Diagnoses:  1. Pain in left hip     Plan: No additional findings.   Meds & Orders: No orders of the defined types were placed in this encounter.   Orders Placed This Encounter  Procedures  . Large Joint Inj: L hip joint  . XR C-ARM NO REPORT    Follow-up: Return for visit to requesting physician as needed.   Procedures: Large Joint Inj: L hip joint on 07/18/2019 10:53 AM Indications: diagnostic evaluation and pain Details: 22 G 3.5 in needle, fluoroscopy-guided anterior approach  Arthrogram: No  Medications: 4 mL bupivacaine 0.25 %; 60 mg triamcinolone acetonide 40 MG/ML Outcome: tolerated well, no immediate complications  There was excellent flow of contrast producing a partial arthrogram of the hip. The patient did have relief of symptoms during the anesthetic phase of the injection. Procedure, treatment alternatives, risks and benefits explained, specific risks discussed. Consent was given by the patient. Immediately prior to procedure a time out was called to verify the correct patient, procedure, equipment, support staff and site/side marked as required.  Patient was prepped and draped in the usual sterile fashion.      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 Lateral recess narrowing right more than left at L4-5. Far right lateral protrusion at L5-S1. No significant central canal stenosis.     Objective:  VS:  HT:    WT:   BMI:     BP:   HR: bpm  TEMP: ( )  RESP:  Physical Exam  Ortho Exam Imaging: No results found.

## 2019-07-28 ENCOUNTER — Encounter: Payer: Self-pay | Admitting: Orthopedic Surgery

## 2019-07-28 ENCOUNTER — Ambulatory Visit (INDEPENDENT_AMBULATORY_CARE_PROVIDER_SITE_OTHER): Payer: Medicare HMO | Admitting: Orthopedic Surgery

## 2019-07-28 ENCOUNTER — Ambulatory Visit: Payer: Self-pay

## 2019-07-28 ENCOUNTER — Other Ambulatory Visit: Payer: Self-pay

## 2019-07-28 VITALS — Ht 66.5 in | Wt 186.0 lb

## 2019-07-28 DIAGNOSIS — M545 Low back pain, unspecified: Secondary | ICD-10-CM

## 2019-07-28 DIAGNOSIS — E039 Hypothyroidism, unspecified: Secondary | ICD-10-CM | POA: Diagnosis not present

## 2019-07-28 DIAGNOSIS — M25562 Pain in left knee: Secondary | ICD-10-CM | POA: Diagnosis not present

## 2019-07-28 DIAGNOSIS — E7800 Pure hypercholesterolemia, unspecified: Secondary | ICD-10-CM | POA: Diagnosis not present

## 2019-07-28 DIAGNOSIS — I10 Essential (primary) hypertension: Secondary | ICD-10-CM | POA: Diagnosis not present

## 2019-07-28 DIAGNOSIS — E785 Hyperlipidemia, unspecified: Secondary | ICD-10-CM | POA: Diagnosis not present

## 2019-07-29 ENCOUNTER — Encounter: Payer: Self-pay | Admitting: Orthopedic Surgery

## 2019-07-29 NOTE — Progress Notes (Signed)
Office Visit Note   Patient: Barbara Patton           Date of Birth: 01-Nov-1940           MRN: [id number] Visit Date: 07/28/2019 Requested by: Seward Carol, MD [address] [place],  [place] [address] PCP: Seward Carol, MD  Subjective: Chief Complaint  Patient presents with  . Right Shoulder - Follow-up, Pain  . Left Hip - Follow-up, Pain  . Left Knee - Pain, Follow-up    HPI: Barbara Patton is a patient with multiple orthopedic complaints.  She had left hip injection 07/18/2019 and did get relief completely for several weeks but now the pain is gradually coming back.  That injection was into the left hip intra-articular joint.  She also has continued problems with right shoulder which has rotator cuff arthropathy.  Left shoulder has had prior surgery.  Shoulder pain actually eased up some after the intra-articular left hip injection.  She also reports some low back pain.  No fevers or chills or red flag symptoms.  She reports general mild low back pain and has a history of epidural steroid injections in the past.  She is taking Tylenol tramadol and using CBD cream.              ROS: All systems reviewed are negative as they relate to the chief complaint within the history of present illness.  Patient denies  fevers or chills.   Assessment & Plan: Visit Diagnoses:  1. Left knee pain, unspecified chronicity   2. Low back pain, unspecified back pain laterality, unspecified chronicity, unspecified whether sciatica present     Plan: Impression is well-functioning left revision total knee replacement with no evidence of effusion or warmth.  I think that something that we just have to watch for now.  I think she is having some hip arthritis type symptoms with good relief from intra-articular injection.  That can be repeated 1 or 2 times more this year.  Regarding the back she wants to try physical therapy.  If that is not helpful then she should come back in 4 weeks for repeat  assessment and possible repeat imaging with epidural steroid injections to follow.  Follow-Up Instructions: Return if symptoms worsen or fail to improve.   Orders:  Orders Placed This Encounter  Procedures  . XR Knee 1-2 Views Left  . Ambulatory referral to Physical Therapy   No orders of the defined types were placed in this encounter.     Procedures: No procedures performed   Clinical Data: No additional findings.  Objective: Vital Signs: Ht 5' 6.5" (1.689 m)   Wt 186 lb (84.4 kg)   BMI 29.57 kg/m   Physical Exam:   Constitutional: Patient appears well-developed HEENT:  Head: Normocephalic Eyes:EOM are normal Neck: Normal range of motion Cardiovascular: Normal rate Pulmonary/chest: Effort normal Neurologic: Patient is alert Skin: Skin is warm Psychiatric: Patient has normal mood and affect    Ortho Exam: Ortho exam demonstrates left knee range of motion 0-90 with no effusion and no warmth.  Collaterals are stable and there is intact extensor mechanism.  No nerve root tension signs.  Patient has no discrete trochanteric bursitis symptoms.  Mild pain with forward lateral bending.  Minimal groin pain with internal/external rotation of each hip.  Right shoulder has actually pretty reasonable forward elevation and strength although a little bit of coarseness is present.  Also coarseness present on the left-hand side but that is asymptomatic.  Specialty Comments:  No specialty comments available.  Imaging: No results found.   PMFS History: Patient Active Problem List   Diagnosis Date Noted  . 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 Referred otalgia of left ear 08/23/2015  . 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 Allergic rhinitis 07/26/2015  . Anxiety 07/26/2015  . Benign essential hypertension 07/26/2015  . Dysfunction of left eustachian tube 07/26/2015  . Esophageal reflux 07/26/2015  . Fibromyalgia 07/26/2015  . Pure  hypercholesterolemia 07/26/2015  . 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 Pes anserinus bursitis of left knee 12/04/2013  . Trochanteric bursitis of left hip 08/07/2013  . Leg swelling 12/06/2011  . Glaucoma 11/02/2011  . HLD (hyperlipidemia) 11/02/2011  . HTN (hypertension) 11/02/2011  . Hypothyroid 11/02/2011  . OA (osteoarthritis) 11/02/2011  . 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 ([place]) 03/16/2011  . Presence of artificial knee joint 03/16/2011   Past Medical History:  Diagnosis Date  . High cholesterol   . Hypertension   . Thyroid disease     History reviewed. No pertinent family history.  Past Surgical History:  Procedure Laterality Date  . JOINT REPLACEMENT     Social History   Occupational History  . Not on file  Tobacco Use  . Smoking status: Former Smoker    Quit date: 1997    Years since quitting: 24.1  . Smokeless tobacco: Never Used  Substance and Sexual Activity  . Alcohol use: Yes    Alcohol/week: 1.0 standard drinks    Types: 1 Glasses of wine per week    Comment: occ wine  . Drug use: No  . Sexual activity: Not on file

## 2019-08-05 DIAGNOSIS — H401132 Primary open-angle glaucoma, bilateral, moderate stage: Secondary | ICD-10-CM | POA: Diagnosis not present

## 2019-08-05 DIAGNOSIS — Z961 Presence of intraocular lens: Secondary | ICD-10-CM | POA: Diagnosis not present

## 2019-08-06 ENCOUNTER — Other Ambulatory Visit: Payer: Self-pay

## 2019-08-06 ENCOUNTER — Ambulatory Visit (INDEPENDENT_AMBULATORY_CARE_PROVIDER_SITE_OTHER): Payer: Medicare HMO | Admitting: Physical Therapy

## 2019-08-06 ENCOUNTER — Encounter: Payer: Self-pay | Admitting: Physical Therapy

## 2019-08-06 DIAGNOSIS — M545 Low back pain, unspecified: Secondary | ICD-10-CM

## 2019-08-06 DIAGNOSIS — G8929 Other chronic pain: Secondary | ICD-10-CM | POA: Diagnosis not present

## 2019-08-06 DIAGNOSIS — R29898 Other symptoms and signs involving the musculoskeletal system: Secondary | ICD-10-CM | POA: Diagnosis not present

## 2019-08-06 NOTE — Therapy (Signed)
[facility] Physical Therapy [address] [place], [place], [address] Phone: [phone]   Fax:  [phone]  Physical Therapy Evaluation  Patient Details  Name: Barbara Patton MRN: [id number] Date of Birth: January 25, 1941 Referring Provider (PT): Marlou Sa Tonna Corner, MD   Encounter Date: 08/06/2019  PT End of Session - 08/06/19 1309    Visit Number  1    Number of Visits  12    Date for PT Re-Evaluation  09/17/19    PT Start Time  0930    PT Stop Time  1010    PT Time Calculation (min)  40 min    Activity Tolerance  Patient tolerated treatment well    Behavior During Therapy  [facility] for tasks assessed/performed       Past Medical History:  Diagnosis Date  . High cholesterol   . Hypertension   . Thyroid disease     Past Surgical History:  Procedure Laterality Date  . JOINT REPLACEMENT      There were no vitals filed for this visit.   Subjective Assessment - 08/06/19 0938    Subjective  Pt is a 79 y/o female who presents to OPPT for chronic low back pain x many years and requested PT referral to help with pain.    Pertinent History  lupus, fibromyalgia    Limitations  Standing;Walking    How long can you stand comfortably?  unsure - reports it varies    How long can you walk comfortably?  "I can walk"    Diagnostic tests  none recently    Patient Stated Goals  "I want to feel better." decreased pain, especially with getting up in AM    Currently in Pain?  Yes    Pain Score  0-No pain   up to 8/10   Pain Location  Back    Pain Orientation  Lower;Left    Pain Descriptors / Indicators  Stabbing;Sharp    Pain Type  Chronic pain    Pain Onset  More than a month ago    Pain Frequency  Intermittent    Aggravating Factors   worse in AM, standing/walking, general mobility    Pain Relieving Factors  medication helps a little, otherwise nothing         [facility] PT Assessment - 08/06/19 0943      Assessment   Medical Diagnosis  M54.5 (ICD-10-CM) - Low  back pain, unspecified back pain laterality, unspecified chronicity, unspecified whether sciatica present    Referring Provider (PT)  Marlou Sa Tonna Corner, MD    Onset Date/Surgical Date  --   many years   Hand Dominance  Right    Next MD Visit  PRN    Prior Therapy  none recently      Precautions   Precautions  None      Restrictions   Weight Bearing Restrictions  No      Balance Screen   Has the patient fallen in the past 6 months  No    Has the patient had a decrease in activity level because of a fear of falling?   No    Is the patient reluctant to leave their home because of a fear of falling?   No      Home Environment   Living Environment  Private residence    Living Arrangements  Children    Type of [place] Access  Level entry    [place]  One level  Prior Function   Level of [place]  Independent    Vocation  Retired    [employer]  retired from the [employer], goes to [employer] (water) 2x/wk and [employer] 1-2days/wk       Cognition   Overall Cognitive Status  Within Functional Limits for tasks assessed      Posture/Postural Control   Posture/Postural Control  Postural limitations    Postural Limitations  Rounded Shoulders;Forward head      ROM / Strength   AROM / PROM / Strength  AROM;Strength      AROM   Overall AROM Comments  mild pain with Lt quadrant    AROM Assessment Site  Lumbar    Lumbar Flexion  WNL - no change    Lumbar Extension  WNL      Strength   Overall Strength  Within functional limits for tasks performed      Flexibility   Soft Tissue Assessment /Muscle Length  yes    Hamstrings  tightness bil    ITB  tightness bil    Piriformis  tightness bil    Quadratus Lumborum  tightness bil                Objective measurements completed on examination: See above findings.      [facility] Adult PT Treatment/Exercise - 08/06/19 0943      Exercises   Exercises  Other  Exercises    Other Exercises   instructed in stretching program to perform after working out at gym - pt performed 1-2 reps of each exercises; see pt instructions for details             PT Education - 08/06/19 1309    Education Details  HEP    Person(s) Educated  Patient    Methods  Explanation;Demonstration;Handout    Comprehension  Verbalized understanding;Returned demonstration;Need further instruction          PT Long Term Goals - 08/06/19 1442      PT LONG TERM GOAL #1   Title  independent with HEP    Status  New    Target Date  09/17/19      PT LONG TERM GOAL #2   Title  report pain < 6/10 when elevated for improved function and mobility    Status  New    Target Date  09/17/19      PT LONG TERM GOAL #3   Title  report 50% improvement in getting out of bed in AM for improved function    Status  New    Target Date  09/17/19             Plan - 08/06/19 1309    Clinical Impression Statement  Pt is a 79 y/o female who presents to OPPT with chronic back pain.  Pt demonstrates decreased core/hip stability and strength as well as decreased flexibility affecting functional mobilty.  Overall pt active going to gym 3-4 days/wk with focus on cardio and strength, so encouraged to continue and add in stretching following workouts.  HEP today focused on stretching program.  Will benefit from PT to address deficits listed.    Personal Factors and Comorbidities  Comorbidity 3+    Comorbidities  HTN, bil TKA, fibromyalgia, lupus, arthritis    Examination-Activity Limitations  Stand;Locomotion Level;Carry;Lift;Squat    Examination-Participation Restrictions  Community Activity;Cleaning    Stability/Clinical Decision Making  Evolving/Moderate complexity    Clinical Decision  Making  Moderate    Rehab Potential  Good    PT Frequency  2x / week    PT Duration  6 weeks    PT Treatment/Interventions  ADLs/Self Care Home Management;Cryotherapy;Electrical Stimulation;Moist  Heat;Traction;Therapeutic exercise;Therapeutic activities;Functional mobility training;Stair training;Gait training;Ultrasound;Neuromuscular re-education;Passive range of motion;Dry needling;Taping    PT Next Visit Plan  review stretches, needs core strengthening, modalities PRN    PT Home Exercise Plan  Access Code: [email or web address]       Patient will benefit from skilled therapeutic intervention in order to improve the following deficits and impairments:  Decreased strength, Pain, Impaired flexibility  Visit Diagnosis: Chronic midline low back pain without sciatica - Plan: PT plan of care cert/re-cert  Other symptoms and signs involving the musculoskeletal system - Plan: PT plan of care cert/re-cert     Problem List Patient Active Problem List   Diagnosis Date Noted  . 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 Referred otalgia of left ear 08/23/2015  . 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 Allergic rhinitis 07/26/2015  . Anxiety 07/26/2015  . Benign essential hypertension 07/26/2015  . Dysfunction of left eustachian tube 07/26/2015  . Esophageal reflux 07/26/2015  . Fibromyalgia 07/26/2015  . Pure hypercholesterolemia 07/26/2015  . 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 Pes anserinus bursitis of left knee 12/04/2013  . Trochanteric bursitis of left hip 08/07/2013  . Leg swelling 12/06/2011  . Glaucoma 11/02/2011  . HLD (hyperlipidemia) 11/02/2011  . HTN (hypertension) 11/02/2011  . Hypothyroid 11/02/2011  . OA (osteoarthritis) 11/02/2011  . 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 ([place]) 03/16/2011  . Presence of artificial knee joint 03/16/2011      Laureen Abrahams, PT, DPT 08/06/19 2:47 PM    [facility] Physical Therapy [address] [place], [place], [address] Phone: [phone]   Fax:  [phone]  Name: Barbara Patton MRN:  [id number] Date of Birth: 21-Sep-1940

## 2019-08-06 NOTE — Patient Instructions (Signed)
Access Code: [email or web address]  URL: [email or web address]  Date: 08/06/2019  Prepared by: Faustino Congress   Exercises Hooklying Single Knee to Chest - 3 reps - 1 sets - 30 sec hold - 2x daily - 7x weekly Supine Double Knee to Chest - 3 reps - 1 sets - 30 sec hold - 2x daily - 7x weekly Supine Lower Trunk Rotation - 3 reps - 1 sets - 30 sec hold - 2x daily - 7x weekly Hooklying Hamstring Stretch with Strap - 10 reps - 1 sets - 30 sec hold - 1x daily - 7x weekly Supine Piriformis Stretch with Foot on Ground - 3 reps - 1 sets - 30 sec hold - 2x daily - 7x weekly

## 2019-08-15 DIAGNOSIS — M329 Systemic lupus erythematosus, unspecified: Secondary | ICD-10-CM | POA: Diagnosis not present

## 2019-08-15 DIAGNOSIS — H2011 Chronic iridocyclitis, right eye: Secondary | ICD-10-CM | POA: Diagnosis not present

## 2019-08-15 DIAGNOSIS — R21 Rash and other nonspecific skin eruption: Secondary | ICD-10-CM | POA: Diagnosis not present

## 2019-08-15 DIAGNOSIS — M150 Primary generalized (osteo)arthritis: Secondary | ICD-10-CM | POA: Diagnosis not present

## 2019-08-15 DIAGNOSIS — M797 Fibromyalgia: Secondary | ICD-10-CM | POA: Diagnosis not present

## 2019-08-15 DIAGNOSIS — R5383 Other fatigue: Secondary | ICD-10-CM | POA: Diagnosis not present

## 2019-08-15 DIAGNOSIS — E559 Vitamin D deficiency, unspecified: Secondary | ICD-10-CM | POA: Diagnosis not present

## 2019-08-15 DIAGNOSIS — L299 Pruritus, unspecified: Secondary | ICD-10-CM | POA: Diagnosis not present

## 2019-08-15 DIAGNOSIS — M5136 Other intervertebral disc degeneration, lumbar region: Secondary | ICD-10-CM | POA: Diagnosis not present

## 2019-08-19 ENCOUNTER — Encounter: Payer: 59 | Admitting: Physical Therapy

## 2019-08-21 DIAGNOSIS — L309 Dermatitis, unspecified: Secondary | ICD-10-CM | POA: Diagnosis not present

## 2019-08-22 ENCOUNTER — Encounter: Payer: 59 | Admitting: Physical Therapy

## 2019-08-26 ENCOUNTER — Encounter: Payer: 59 | Admitting: Physical Therapy

## 2019-08-28 ENCOUNTER — Encounter: Payer: 59 | Admitting: Physical Therapy

## 2019-08-28 DIAGNOSIS — M8580 Other specified disorders of bone density and structure, unspecified site: Secondary | ICD-10-CM | POA: Diagnosis not present

## 2019-09-03 NOTE — Telephone Encounter (Signed)
disregard

## 2019-09-04 DIAGNOSIS — H34812 Central retinal vein occlusion, left eye, with macular edema: Secondary | ICD-10-CM | POA: Diagnosis not present

## 2019-09-04 DIAGNOSIS — H35033 Hypertensive retinopathy, bilateral: Secondary | ICD-10-CM | POA: Diagnosis not present

## 2019-09-04 DIAGNOSIS — H43813 Vitreous degeneration, bilateral: Secondary | ICD-10-CM | POA: Diagnosis not present

## 2019-09-04 DIAGNOSIS — H35373 Puckering of macula, bilateral: Secondary | ICD-10-CM | POA: Diagnosis not present

## 2019-09-10 MED ORDER — TRIAMCINOLONE ACETONIDE 40 MG/ML IJ SUSP
80.0000 mg | INTRAMUSCULAR | Status: AC | PRN
  Administered 2019-06-19: 14:00:00 80 mg via INTRA_ARTICULAR

## 2019-09-10 MED ORDER — BUPIVACAINE HCL 0.25 % IJ SOLN
4.0000 mL | INTRAMUSCULAR | Status: AC | PRN
  Administered 2019-06-19: 14:00:00 4 mL via INTRA_ARTICULAR

## 2019-09-15 DIAGNOSIS — L600 Ingrowing nail: Secondary | ICD-10-CM | POA: Diagnosis not present

## 2019-09-22 MED ORDER — TRIAMCINOLONE ACETONIDE 40 MG/ML IJ SUSP
60.0000 mg | INTRAMUSCULAR | Status: AC | PRN
  Administered 2019-07-18: 60 mg via INTRA_ARTICULAR

## 2019-09-22 MED ORDER — BUPIVACAINE HCL 0.25 % IJ SOLN
4.0000 mL | INTRAMUSCULAR | Status: AC | PRN
  Administered 2019-07-18: 4 mL via INTRA_ARTICULAR

## 2019-10-07 ENCOUNTER — Other Ambulatory Visit (INDEPENDENT_AMBULATORY_CARE_PROVIDER_SITE_OTHER): Payer: Self-pay | Admitting: Orthopedic Surgery

## 2019-10-08 ENCOUNTER — Ambulatory Visit (INDEPENDENT_AMBULATORY_CARE_PROVIDER_SITE_OTHER): Payer: Medicare HMO | Admitting: Physical Therapy

## 2019-10-08 ENCOUNTER — Encounter: Payer: Self-pay | Admitting: Physical Therapy

## 2019-10-08 ENCOUNTER — Other Ambulatory Visit: Payer: Self-pay

## 2019-10-08 DIAGNOSIS — G8929 Other chronic pain: Secondary | ICD-10-CM

## 2019-10-08 DIAGNOSIS — R29898 Other symptoms and signs involving the musculoskeletal system: Secondary | ICD-10-CM

## 2019-10-08 DIAGNOSIS — M545 Low back pain, unspecified: Secondary | ICD-10-CM

## 2019-10-08 NOTE — Patient Instructions (Signed)
Access Code: [email or web address] URL: [email or web address] Date: 10/08/2019 Prepared by: Faustino Congress  Exercises Hooklying Single Knee to Chest - 2 x daily - 7 x weekly - 3 reps - 1 sets - 30 sec hold Supine Double Knee to Chest - 2 x daily - 7 x weekly - 3 reps - 1 sets - 30 sec hold Supine Lower Trunk Rotation - 2 x daily - 7 x weekly - 3 reps - 1 sets - 30 sec hold Hooklying Hamstring Stretch with Strap - 1 x daily - 7 x weekly - 10 reps - 1 sets - 30 sec hold Supine Piriformis Stretch with Foot on Ground - 2 x daily - 7 x weekly - 3 reps - 1 sets - 30 sec hold Standing Glute Med Mobilization with Small Ball on Wall - 2 x daily - 7 x weekly - 1-2 reps - 1 sets

## 2019-10-08 NOTE — Therapy (Signed)
[facility] Physical Therapy [address] [place], [place], [address] Phone: [phone]   Fax:  [phone]  Physical Therapy Re-Evaluation  Patient Details  Name: Barbara Patton MRN: [id number] Date of Birth: Jun 03, 1941 Referring Provider (PT): Marlou Sa Tonna Corner, MD   Encounter Date: 10/08/2019  PT End of Session - 10/08/19 1326    Visit Number  2    Number of Visits  12    Date for PT Re-Evaluation  09/17/19    PT Start Time  1300    PT Stop Time  1330    PT Time Calculation (min)  30 min    Activity Tolerance  Patient tolerated treatment well    Behavior During Therapy  [facility] for tasks assessed/performed       Past Medical History:  Diagnosis Date  . High cholesterol   . Hypertension   . Thyroid disease     Past Surgical History:  Procedure Laterality Date  . JOINT REPLACEMENT      There were no vitals filed for this visit.   Subjective Assessment - 10/08/19 1301    Subjective  Returns after 2 months, came for initial eval then canceled her remaining appts. She reports her back is unchanged, and did exercises "sometimes." Pt says she did her HEP about 3 times total since early March.  Going to the gym 3-4 times/wk.    Pertinent History  lupus, fibromyalgia    Limitations  Standing;Walking    How long can you stand comfortably?  unsure - reports it varies    How long can you walk comfortably?  "I can walk"    Diagnostic tests  none recently    Patient Stated Goals  "I want to feel better." decreased pain, especially with getting up in AM    Currently in Pain?  Yes    Pain Score  0-No pain   up to 5-6/10   Pain Location  Back    Pain Orientation  Left;Lower    Pain Descriptors / Indicators  Constant;Aching    Pain Type  Chronic pain    Pain Onset  More than a month ago    Pain Frequency  Intermittent    Aggravating Factors   standing/walking, worse in AM    Pain Relieving Factors  CBD cream, muscle relaxer, tylenol, tramadol, lumbar  support with magnets         [facility] PT Assessment - 10/08/19 1307      Assessment   Medical Diagnosis  M54.5 (ICD-10-CM) - Low back pain, unspecified back pain laterality, unspecified chronicity, unspecified whether sciatica present    Referring Provider (PT)  Meredith Pel, MD    Onset Date/Surgical Date  --   chronic   Hand Dominance  Right    Next MD Visit  PRN    Prior Therapy  came for 1 visit in March      Precautions   Precautions  None      Restrictions   Weight Bearing Restrictions  No      Balance Screen   Has the patient fallen in the past 6 months  No    Has the patient had a decrease in activity level because of a fear of falling?   No    Is the patient reluctant to leave their home because of a fear of falling?   No      AROM   Overall AROM Comments  mild pain with Lt quadrant    Lumbar Flexion  WNL    Lumbar Extension  WNL      Strength   Overall Strength  Within functional limits for tasks performed      Flexibility   Hamstrings  tightness bil    ITB  tightness bil    Piriformis  tightness bil    Quadratus Lumborum  tightness bil      Special Tests    Special Tests  Lumbar    Lumbar Tests  Slump Test      Slump test   Findings  Negative                Objective measurements completed on examination: See above findings.      [place] Adult PT Treatment/Exercise - 10/08/19 0001      Exercises   Exercises  Lumbar    Other Exercises   verbally reviewed HEP, performed exercises below; also instructed in self mobilization with tennis ball - performed x 5 min      Lumbar Exercises: Stretches   Single Knee to Chest Stretch  Right;Left;1 rep;30 seconds    Piriformis Stretch  Right;Left;2 reps;30 seconds    Piriformis Stretch Limitations  trial of supine and seated - most effective supine holding hamstring due to hx of TKA with limited knee flexion on Lt             PT Education - 10/08/19 1326    Education Details  reviewed  HEP    Person(s) Educated  Patient    Methods  Explanation;Demonstration;Handout    Comprehension  Verbalized understanding;Returned demonstration;Need further instruction          PT Long Term Goals - 10/08/19 1339      PT LONG TERM GOAL #1   Title  independent with HEP    Status  New    Target Date  11/19/19      PT LONG TERM GOAL #2   Title  report pain < 6/10 when elevated for improved function and mobility    Status  New    Target Date  11/19/19      PT LONG TERM GOAL #3   Title  report 50% improvement in getting out of bed in AM for improved function    Status  New    Target Date  11/19/19             Plan - 10/08/19 1340    Clinical Impression Statement  Pt is a 79 y/o female who returns to OPPT for continued back pain.  Pt seen for initial eval on 08/06/19 and then cx remaining appts.  Pt presents today with no change in symptoms, and very inconsistent with HEP (completed ~ 3 times over 2 months).  Session today focused on review of HEP and education for consistent HEP to see benefit.  Will benefit from PT to address deficits.    Personal Factors and Comorbidities  Comorbidity 3+    Comorbidities  HTN, bil TKA, fibromyalgia, lupus, arthritis    Examination-Activity Limitations  Stand;Locomotion Level;Carry;Lift;Squat    Examination-Participation Restrictions  Community Activity;Cleaning    Stability/Clinical Decision Making  Evolving/Moderate complexity    Rehab Potential  Good    PT Frequency  2x / week    PT Duration  6 weeks    PT Treatment/Interventions  ADLs/Self Care Home Management;Cryotherapy;Electrical Stimulation;Moist Heat;Traction;Therapeutic exercise;Therapeutic activities;Functional mobility training;Stair training;Gait training;Ultrasound;Neuromuscular re-education;Passive range of motion;Dry needling;Taping    PT Next Visit Plan  review stretches, needs core strengthening, modalities PRN  PT Home Exercise Plan  Access Code: [email or web address]    Consulted  and Agree with Plan of Care  Patient       Patient will benefit from skilled therapeutic intervention in order to improve the following deficits and impairments:  Decreased strength, Pain, Impaired flexibility  Visit Diagnosis: Chronic midline low back pain without sciatica - Plan: PT plan of care cert/re-cert  Other symptoms and signs involving the musculoskeletal system - Plan: PT plan of care cert/re-cert     Problem List Patient Active Problem List   Diagnosis Date Noted  . 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 Referred otalgia of left ear 08/23/2015  . 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 Allergic rhinitis 07/26/2015  . Anxiety 07/26/2015  . Benign essential hypertension 07/26/2015  . Dysfunction of left eustachian tube 07/26/2015  . Esophageal reflux 07/26/2015  . Fibromyalgia 07/26/2015  . Pure hypercholesterolemia 07/26/2015  . 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 Pes anserinus bursitis of left knee 12/04/2013  . Trochanteric bursitis of left hip 08/07/2013  . Leg swelling 12/06/2011  . Glaucoma 11/02/2011  . HLD (hyperlipidemia) 11/02/2011  . HTN (hypertension) 11/02/2011  . Hypothyroid 11/02/2011  . OA (osteoarthritis) 11/02/2011  . 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 Presence of artificial knee joint 03/16/2011      Laureen Abrahams, PT, DPT 10/08/19 1:44 PM    [facility] Physical Therapy [address] [place], [place], [address] Phone: [phone]   Fax:  [phone]  Name: Barbara Patton MRN: [id number] Date of Birth: 1940-08-05

## 2019-10-08 NOTE — Telephone Encounter (Signed)
Pls advise. Thanks.  

## 2019-10-10 DIAGNOSIS — E785 Hyperlipidemia, unspecified: Secondary | ICD-10-CM | POA: Diagnosis not present

## 2019-10-10 DIAGNOSIS — E7800 Pure hypercholesterolemia, unspecified: Secondary | ICD-10-CM | POA: Diagnosis not present

## 2019-10-10 DIAGNOSIS — I10 Essential (primary) hypertension: Secondary | ICD-10-CM | POA: Diagnosis not present

## 2019-10-10 DIAGNOSIS — E039 Hypothyroidism, unspecified: Secondary | ICD-10-CM | POA: Diagnosis not present

## 2019-10-16 ENCOUNTER — Other Ambulatory Visit: Payer: Self-pay

## 2019-10-16 ENCOUNTER — Ambulatory Visit (INDEPENDENT_AMBULATORY_CARE_PROVIDER_SITE_OTHER): Payer: Medicare HMO | Admitting: Physical Therapy

## 2019-10-16 DIAGNOSIS — R29898 Other symptoms and signs involving the musculoskeletal system: Secondary | ICD-10-CM | POA: Diagnosis not present

## 2019-10-16 DIAGNOSIS — M545 Low back pain: Secondary | ICD-10-CM | POA: Diagnosis not present

## 2019-10-16 DIAGNOSIS — G8929 Other chronic pain: Secondary | ICD-10-CM | POA: Diagnosis not present

## 2019-10-16 NOTE — Therapy (Signed)
[facility] Physical Therapy [address] [place], [place], [address] Phone: [phone]   Fax:  [phone]  Physical Therapy Treatment  Patient Details  Name: Barbara Patton MRN: [id number] Date of Birth: Oct 07, 1940 Referring Provider (PT): Marlou Sa Tonna Corner, MD   Encounter Date: 10/16/2019  PT End of Session - 10/16/19 1415    Visit Number  3    Number of Visits  12    Date for PT Re-Evaluation  09/17/19    PT Start Time  1346    PT Stop Time  1425    PT Time Calculation (min)  39 min    Activity Tolerance  Patient tolerated treatment well    Behavior During Therapy  [facility] for tasks assessed/performed       Past Medical History:  Diagnosis Date  . High cholesterol   . Hypertension   . Thyroid disease     Past Surgical History:  Procedure Laterality Date  . JOINT REPLACEMENT      There were no vitals filed for this visit.  Subjective Assessment - 10/16/19 1413    Subjective  Pt arriving to therapy today reporting 5/10 pain in her low back more toward the left side. Pt reported she has been going to the gym and arrived after doing 30 minutes of exericses including the bike.    Pertinent History  lupus, fibromyalgia    Limitations  Standing;Walking    How long can you stand comfortably?  unsure - reports it varies    How long can you walk comfortably?  "I can walk"    Diagnostic tests  none recently    Patient Stated Goals  "I want to feel better." decreased pain, especially with getting up in AM    Currently in Pain?  Yes    Pain Score  5     Pain Location  Back    Pain Orientation  Left;Lower    Pain Descriptors / Indicators  Aching    Pain Type  Chronic pain    Pain Onset  More than a month ago                        [facility] Adult PT Treatment/Exercise - 10/16/19 0001      Exercises   Exercises  Lumbar      Lumbar Exercises: Stretches   Single Knee to Chest Stretch  Right;Left;2 reps;30 seconds    Lower Trunk  Rotation  2 reps;20 seconds;Limitations    Lower Trunk Rotation Limitations  both sides    Hip Flexor Stretch  Right;Left;2 reps;20 seconds    Hip Flexor Stretch Limitations  hip off edge of mat table with a little support provided by therapist    Pelvic Tilt  10 reps;5 seconds;Limitations    Pelvic Tilt Limitations  increased instructions    Piriformis Stretch  Right;Left;2 reps;30 seconds    Piriformis Stretch Limitations  modified position    Gastroc Stretch  2 reps;30 seconds    Gastroc Stretch Limitations  slant board      Lumbar Exercises: Standing   Other Standing Lumbar Exercises  step ups with core activiation ion 4 inch step x 10 reps      Lumbar Exercises: Supine   Ab Set  5 reps;5 seconds    Clam  5 reps;5 seconds    Bridge  10 reps;3 seconds    Straight Leg Raise  10 reps  PT Long Term Goals - 10/16/19 1416      PT LONG TERM GOAL #1   Title  independent with HEP    Status  On-going      PT LONG TERM GOAL #2   Title  report pain < 6/10 when elevated for improved function and mobility    Status  On-going      PT LONG TERM GOAL #3   Title  report 50% improvement in getting out of bed in AM for improved function    Status  On-going            Plan - 10/16/19 1416    Clinical Impression Statement  Barbara Patton arrived to therapy reporting 5/ 10 pain in her low back. Pt tolerating exercises well with instructions for core activation. Pt reporting feeling mild relief after stretching, but feels like her pain overall has not changed. Pt encouraged to be consistent in her HEP and stretching at home and before going to the gym to work on [place]. Continue skilled PT.    Comorbidities  HTN, bil TKA, fibromyalgia, lupus, arthritis    Examination-Activity Limitations  Stand;Locomotion Level;Carry;Lift;Squat    Examination-Participation Restrictions  Community Activity;Cleaning    Stability/Clinical Decision Making  Evolving/Moderate complexity     Rehab Potential  Good    PT Frequency  2x / week    PT Duration  6 weeks    PT Treatment/Interventions  ADLs/Self Care Home Management;Cryotherapy;Electrical Stimulation;Moist Heat;Traction;Therapeutic exercise;Therapeutic activities;Functional mobility training;Stair training;Gait training;Ultrasound;Neuromuscular re-education;Passive range of motion;Dry needling;Taping    PT Next Visit Plan  review stretches, needs core strengthening, modalities PRN    PT Home Exercise Plan  Access Code: [email or web address]    Consulted and Agree with Plan of Care  Patient       Patient will benefit from skilled therapeutic intervention in order to improve the following deficits and impairments:  Decreased strength, Pain, Impaired flexibility  Visit Diagnosis: Chronic midline low back pain without sciatica  Other symptoms and signs involving the musculoskeletal system     Problem List Patient Active Problem List   Diagnosis Date Noted  . 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 Referred otalgia of left ear 08/23/2015  . 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 Allergic rhinitis 07/26/2015  . Anxiety 07/26/2015  . Benign essential hypertension 07/26/2015  . Dysfunction of left eustachian tube 07/26/2015  . Esophageal reflux 07/26/2015  . Fibromyalgia 07/26/2015  . Pure hypercholesterolemia 07/26/2015  . 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 Pes anserinus bursitis of left knee 12/04/2013  . Trochanteric bursitis of left hip 08/07/2013  . Leg swelling 12/06/2011  . Glaucoma 11/02/2011  . HLD (hyperlipidemia) 11/02/2011  . HTN (hypertension) 11/02/2011  . Hypothyroid 11/02/2011  . OA (osteoarthritis) 11/02/2011  . 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 Presence of artificial knee joint 03/16/2011    Oretha Caprice PT, MPT 10/16/2019, 2:33 PM  [facility]  Physical Therapy [address] [place], [place], [address] Phone: [phone]   Fax:  [phone]  Name: Barbara Patton MRN: [id number] Date of Birth: 1940/10/29

## 2019-10-20 ENCOUNTER — Encounter: Payer: Self-pay | Admitting: Physical Therapy

## 2019-10-20 ENCOUNTER — Other Ambulatory Visit: Payer: Self-pay

## 2019-10-20 ENCOUNTER — Ambulatory Visit (INDEPENDENT_AMBULATORY_CARE_PROVIDER_SITE_OTHER): Payer: Medicare HMO | Admitting: Physical Therapy

## 2019-10-20 DIAGNOSIS — M545 Low back pain: Secondary | ICD-10-CM | POA: Diagnosis not present

## 2019-10-20 DIAGNOSIS — G8929 Other chronic pain: Secondary | ICD-10-CM

## 2019-10-20 DIAGNOSIS — R29898 Other symptoms and signs involving the musculoskeletal system: Secondary | ICD-10-CM | POA: Diagnosis not present

## 2019-10-20 NOTE — Therapy (Signed)
[facility] Physical Therapy [address] [place], [place], [address] Phone: [phone]   Fax:  [phone]  Physical Therapy Treatment  Patient Details  Name: Barbara Patton MRN: [id number] Date of Birth: Jul 02, 1940 Referring Provider (PT): Marlou Sa Tonna Corner, MD   Encounter Date: 10/20/2019  PT End of Session - 10/20/19 1248    Visit Number  4    Number of Visits  12    Date for PT Re-Evaluation  09/17/19    PT Start Time  1146    PT Stop Time  1227    PT Time Calculation (min)  41 min    Activity Tolerance  Patient tolerated treatment well    Behavior During Therapy  [facility] for tasks assessed/performed       Past Medical History:  Diagnosis Date  . High cholesterol   . Hypertension   . Thyroid disease     Past Surgical History:  Procedure Laterality Date  . JOINT REPLACEMENT      There were no vitals filed for this visit.  Subjective Assessment - 10/20/19 1148    Subjective  back "feels alright right now."  reports pain is about the same, but hasn't had any pain today.    Pertinent History  lupus, fibromyalgia    Limitations  Standing;Walking    How long can you stand comfortably?  unsure - reports it varies    How long can you walk comfortably?  "I can walk"    Diagnostic tests  none recently    Patient Stated Goals  "I want to feel better." decreased pain, especially with getting up in AM    Currently in Pain?  No/denies    Pain Onset  More than a month ago                        [facility] Adult PT Treatment/Exercise - 10/20/19 1151      Lumbar Exercises: Stretches   Passive Hamstring Stretch  Right;Left;2 reps;30 seconds    Passive Hamstring Stretch Limitations  supine with strap    Single Knee to Chest Stretch  Right;Left;2 reps;30 seconds    Lower Trunk Rotation  2 reps;20 seconds;Limitations    Lower Trunk Rotation Limitations  bil      Lumbar Exercises: Supine   Ab Set  10 reps;5 seconds    Clam  10 reps   with  ab set - cues needed for technique   Bridge  10 reps;3 seconds      Lumbar Exercises: Sidelying   Clam  Both;10 reps    Clam Limitations  red theraband      Manual Therapy   Manual Therapy  Soft tissue mobilization    Manual therapy comments  skilled palpation and monitoring of soft tissue during DN    Soft tissue mobilization  Lt quadratus lumborum       Trigger Point Dry Needling - 10/20/19 1244    Consent Given?  Yes    Education Handout Provided  Yes    Muscles Treated Back/Hip  Quadratus lumborum    Quadratus Lumborum Response  Twitch response elicited;Palpable increased muscle length           PT Education - 10/20/19 1248    Education Details  DN    Person(s) Educated  Patient    Methods  Explanation;Handout    Comprehension  Verbalized understanding          PT Long Term Goals - 10/16/19 1416  PT LONG TERM GOAL #1   Title  independent with HEP    Status  On-going      PT LONG TERM GOAL #2   Title  report pain < 6/10 when elevated for improved function and mobility    Status  On-going      PT LONG TERM GOAL #3   Title  report 50% improvement in getting out of bed in AM for improved function    Status  On-going            Plan - 10/20/19 1248    Clinical Impression Statement  Pt reported no pain today and tolerated session well today.  Still needs cues for core exercises.  Trial of DN and manual today to Lt QL to see if this helps with pain and tightness.  Will continue to benefit from PT to maximize function.    Comorbidities  HTN, bil TKA, fibromyalgia, lupus, arthritis    Examination-Activity Limitations  Stand;Locomotion Level;Carry;Lift;Squat    Examination-Participation Restrictions  Community Activity;Cleaning    Stability/Clinical Decision Making  Evolving/Moderate complexity    Rehab Potential  Good    PT Frequency  2x / week    PT Duration  6 weeks    PT Treatment/Interventions  ADLs/Self Care Home Management;Cryotherapy;Electrical  Stimulation;Moist Heat;Traction;Therapeutic exercise;Therapeutic activities;Functional mobility training;Stair training;Gait training;Ultrasound;Neuromuscular re-education;Passive range of motion;Dry needling;Taping    PT Next Visit Plan  needs core strengthening, modalities PRN, assess response to DN    PT Home Exercise Plan  Access Code: [email or web address]    Consulted and Agree with Plan of Care  Patient       Patient will benefit from skilled therapeutic intervention in order to improve the following deficits and impairments:  Decreased strength, Pain, Impaired flexibility  Visit Diagnosis: Chronic midline low back pain without sciatica  Other symptoms and signs involving the musculoskeletal system     Problem List Patient Active Problem List   Diagnosis Date Noted  . 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 Referred otalgia of left ear 08/23/2015  . 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 Allergic rhinitis 07/26/2015  . Anxiety 07/26/2015  . Benign essential hypertension 07/26/2015  . Dysfunction of left eustachian tube 07/26/2015  . Esophageal reflux 07/26/2015  . Fibromyalgia 07/26/2015  . Pure hypercholesterolemia 07/26/2015  . 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 Pes anserinus bursitis of left knee 12/04/2013  . Trochanteric bursitis of left hip 08/07/2013  . Leg swelling 12/06/2011  . Glaucoma 11/02/2011  . HLD (hyperlipidemia) 11/02/2011  . HTN (hypertension) 11/02/2011  . Hypothyroid 11/02/2011  . OA (osteoarthritis) 11/02/2011  . 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 Presence of artificial knee joint 03/16/2011      Barbara Patton, PT, DPT 10/20/19 12:51 PM    [facility] Physical Therapy [address] [place], [place], [address] Phone: [phone]   Fax:  [phone]  Name: Barbara Patton MRN:  [id number] Date of Birth: 1941-04-22

## 2019-10-20 NOTE — Patient Instructions (Signed)

## 2019-10-22 ENCOUNTER — Telehealth: Payer: Self-pay | Admitting: Physical Therapy

## 2019-10-22 ENCOUNTER — Encounter: Payer: 59 | Admitting: Physical Therapy

## 2019-10-22 NOTE — Telephone Encounter (Signed)
Spoke to pt as she didn't show for PT appt.  She thought appt was at 12:45 instead of 11:45.  Rescheduled patient for next available appt which was 5/20 @ 3:15.  Laureen Abrahams, PT, DPT 10/22/19 12:06 PM

## 2019-10-23 ENCOUNTER — Ambulatory Visit (INDEPENDENT_AMBULATORY_CARE_PROVIDER_SITE_OTHER): Payer: Medicare HMO | Admitting: Physical Therapy

## 2019-10-23 ENCOUNTER — Other Ambulatory Visit: Payer: Self-pay

## 2019-10-23 ENCOUNTER — Encounter: Payer: Self-pay | Admitting: Physical Therapy

## 2019-10-23 DIAGNOSIS — G8929 Other chronic pain: Secondary | ICD-10-CM | POA: Diagnosis not present

## 2019-10-23 DIAGNOSIS — M545 Low back pain: Secondary | ICD-10-CM | POA: Diagnosis not present

## 2019-10-23 DIAGNOSIS — R29898 Other symptoms and signs involving the musculoskeletal system: Secondary | ICD-10-CM

## 2019-10-23 NOTE — Therapy (Signed)
[facility] Physical Therapy [address] [place], [place], [address] Phone: [phone]   Fax:  [phone]  Physical Therapy Treatment  Patient Details  Name: Barbara Patton MRN: [id number] Date of Birth: 08/08/1940 Referring Provider (PT): Marlou Sa Tonna Corner, MD   Encounter Date: 10/23/2019  PT End of Session - 10/23/19 1556    Visit Number  5    Number of Visits  12    Date for PT Re-Evaluation  09/17/19    PT Start Time  [id number]    PT Stop Time  1354    PT Time Calculation (min)  39 min    Activity Tolerance  Patient tolerated treatment well    Behavior During Therapy  [facility] for tasks assessed/performed       Past Medical History:  Diagnosis Date  . High cholesterol   . Hypertension   . Thyroid disease     Past Surgical History:  Procedure Laterality Date  . JOINT REPLACEMENT      There were no vitals filed for this visit.  Subjective Assessment - 10/23/19 1513    Subjective  doing well, back is doing okay    Pertinent History  lupus, fibromyalgia    Limitations  Standing;Walking    How long can you stand comfortably?  unsure - reports it varies    How long can you walk comfortably?  "I can walk"    Diagnostic tests  none recently    Patient Stated Goals  "I want to feel better." decreased pain, especially with getting up in AM    Currently in Pain?  No/denies    Pain Onset  More than a month ago                        [facility] Adult PT Treatment/Exercise - 10/23/19 1516      Lumbar Exercises: Stretches   Piriformis Stretch  Left;30 seconds;3 reps    Piriformis Stretch Limitations  modified position    Gastroc Stretch  30 seconds;3 reps    Gastroc Stretch Limitations  slant board      Lumbar Exercises: Aerobic   Nustep  L5 x 8 min      Lumbar Exercises: Supine   Clam  20 reps   green band, single limb   Bridge  20 reps;3 seconds   isometric hold with grren band     Manual Therapy   Manual Therapy  Soft tissue  mobilization    Manual therapy comments  skilled palpation and monitoring of soft tissue during DN    Soft tissue mobilization  Lt glutes and piriformis       Trigger Point Dry Needling - 10/23/19 1556    Consent Given?  Yes    Education Handout Provided  Previously provided    Muscles Treated Back/Hip  Gluteus medius;Gluteus maximus;Piriformis    Gluteus Medius Response  Twitch response elicited;Palpable increased muscle length    Gluteus Maximus Response  Twitch response elicited;Palpable increased muscle length    Piriformis Response  Twitch response elicited;Palpable increased muscle length                PT Long Term Goals - 10/16/19 1416      PT LONG TERM GOAL #1   Title  independent with HEP    Status  On-going      PT LONG TERM GOAL #2   Title  report pain < 6/10 when elevated for improved function and mobility  Status  On-going      PT LONG TERM GOAL #3   Title  report 50% improvement in getting out of bed in AM for improved function    Status  On-going            Plan - 10/23/19 1557    Clinical Impression Statement  Pt stating her pain has decreased and allowed for increased activity with less pain overall.  Positive response to DN and manual therapy of QL so addressed glutes and piriformis today. Will continue to benefit from PT to maximize function.    Comorbidities  HTN, bil TKA, fibromyalgia, lupus, arthritis    Examination-Activity Limitations  Stand;Locomotion Level;Carry;Lift;Squat    Examination-Participation Restrictions  Community Activity;Cleaning    Stability/Clinical Decision Making  Evolving/Moderate complexity    Rehab Potential  Good    PT Frequency  2x / week    PT Duration  6 weeks    PT Treatment/Interventions  ADLs/Self Care Home Management;Cryotherapy;Electrical Stimulation;Moist Heat;Traction;Therapeutic exercise;Therapeutic activities;Functional mobility training;Stair training;Gait training;Ultrasound;Neuromuscular  re-education;Passive range of motion;Dry needling;Taping    PT Next Visit Plan  see how she's doing, does she need more PT, continue strengthening    PT Home Exercise Plan  Access Code: [email or web address]    Consulted and Agree with Plan of Care  Patient       Patient will benefit from skilled therapeutic intervention in order to improve the following deficits and impairments:  Decreased strength, Pain, Impaired flexibility  Visit Diagnosis: Chronic midline low back pain without sciatica  Other symptoms and signs involving the musculoskeletal system     Problem List Patient Active Problem List   Diagnosis Date Noted  . 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 Referred otalgia of left ear 08/23/2015  . 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 Allergic rhinitis 07/26/2015  . Anxiety 07/26/2015  . Benign essential hypertension 07/26/2015  . Dysfunction of left eustachian tube 07/26/2015  . Esophageal reflux 07/26/2015  . Fibromyalgia 07/26/2015  . Pure hypercholesterolemia 07/26/2015  . 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 Pes anserinus bursitis of left knee 12/04/2013  . Trochanteric bursitis of left hip 08/07/2013  . Leg swelling 12/06/2011  . Glaucoma 11/02/2011  . HLD (hyperlipidemia) 11/02/2011  . HTN (hypertension) 11/02/2011  . Hypothyroid 11/02/2011  . OA (osteoarthritis) 11/02/2011  . 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 Presence of artificial knee joint 03/16/2011     Laureen Abrahams, PT, DPT 10/23/19 3:58 PM     [facility] Physical Therapy [address] [place], [place], [address] Phone: [phone]   Fax:  [phone]  Name: Barbara Patton MRN: [id number] Date of Birth: Nov 25, 1940

## 2019-10-27 ENCOUNTER — Other Ambulatory Visit: Payer: Self-pay

## 2019-10-27 ENCOUNTER — Encounter: Payer: Self-pay | Admitting: Physical Therapy

## 2019-10-27 ENCOUNTER — Ambulatory Visit (INDEPENDENT_AMBULATORY_CARE_PROVIDER_SITE_OTHER): Payer: Medicare HMO | Admitting: Physical Therapy

## 2019-10-27 DIAGNOSIS — M545 Low back pain: Secondary | ICD-10-CM | POA: Diagnosis not present

## 2019-10-27 DIAGNOSIS — R29898 Other symptoms and signs involving the musculoskeletal system: Secondary | ICD-10-CM

## 2019-10-27 DIAGNOSIS — G8929 Other chronic pain: Secondary | ICD-10-CM

## 2019-10-27 NOTE — Therapy (Signed)
[facility] Physical Therapy [address] [place], [place], [address] Phone: [phone]   Fax:  [phone]  Physical Therapy Treatment  Patient Details  Name: Barbara Patton MRN: [id number] Date of Birth: 06/30/1940 Referring Provider (PT): Marlou Sa Tonna Corner, MD   Encounter Date: 10/27/2019  PT End of Session - 10/27/19 1229    Visit Number  6    Number of Visits  12    Date for PT Re-Evaluation  11/19/19    PT Start Time  [id number]    PT Stop Time  1229    PT Time Calculation (min)  44 min    Activity Tolerance  Patient tolerated treatment well    Behavior During Therapy  [facility] for tasks assessed/performed       Past Medical History:  Diagnosis Date  . High cholesterol   . Hypertension   . Thyroid disease     Past Surgical History:  Procedure Laterality Date  . JOINT REPLACEMENT      There were no vitals filed for this visit.  Subjective Assessment - 10/27/19 1146    Subjective  doing well - back felt good for 2 days, yesterday and this morning having some pain.  overall feels she's getting better    Pertinent History  lupus, fibromyalgia    Limitations  Standing;Walking    How long can you stand comfortably?  unsure - reports it varies    How long can you walk comfortably?  "I can walk"    Diagnostic tests  none recently    Patient Stated Goals  "I want to feel better." decreased pain, especially with getting up in AM    Currently in Pain?  No/denies    Pain Onset  More than a month ago                        [facility] Adult PT Treatment/Exercise - 10/27/19 1152      Lumbar Exercises: Stretches   Passive Hamstring Stretch  Right;Left;30 seconds;3 reps    Passive Hamstring Stretch Limitations  supine with strap    Single Knee to Chest Stretch  Right;Left;30 seconds;3 reps    Other Lumbar Stretch Exercise  sidelying book openers x 10 reps bil      Lumbar Exercises: Aerobic   Nustep  L6 x 8 min      Lumbar Exercises: Standing    Functional Squats Limitations  TRX squats 2x10    Other Standing Lumbar Exercises  deadlifts 10# KB 2x10      Lumbar Exercises: Seated   Sit to Stand  20 reps   10# KB     Lumbar Exercises: Supine   Bridge  20 reps;5 seconds                  PT Long Term Goals - 10/16/19 1416      PT LONG TERM GOAL #1   Title  independent with HEP    Status  On-going      PT LONG TERM GOAL #2   Title  report pain < 6/10 when elevated for improved function and mobility    Status  On-going      PT LONG TERM GOAL #3   Title  report 50% improvement in getting out of bed in AM for improved function    Status  On-going            Plan - 10/27/19 1229    Clinical Impression Statement  Pt tolerated session well today progressing strengthening exercises.  Will continue to benefit from PT to maximize function.    Comorbidities  HTN, bil TKA, fibromyalgia, lupus, arthritis    Examination-Activity Limitations  Stand;Locomotion Level;Carry;Lift;Squat    Examination-Participation Restrictions  Community Activity;Cleaning    Stability/Clinical Decision Making  Evolving/Moderate complexity    Rehab Potential  Good    PT Frequency  2x / week    PT Duration  6 weeks    PT Treatment/Interventions  ADLs/Self Care Home Management;Cryotherapy;Electrical Stimulation;Moist Heat;Traction;Therapeutic exercise;Therapeutic activities;Functional mobility training;Stair training;Gait training;Ultrasound;Neuromuscular re-education;Passive range of motion;Dry needling;Taping    PT Next Visit Plan  continue strengthening, DN PRN    PT Home Exercise Plan  Access Code: [email or web address]    Consulted and Agree with Plan of Care  Patient       Patient will benefit from skilled therapeutic intervention in order to improve the following deficits and impairments:  Decreased strength, Pain, Impaired flexibility  Visit Diagnosis: Chronic midline low back pain without sciatica  Other symptoms and signs involving the  musculoskeletal system     Problem List Patient Active Problem List   Diagnosis Date Noted  . 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 Referred otalgia of left ear 08/23/2015  . 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 Allergic rhinitis 07/26/2015  . Anxiety 07/26/2015  . Benign essential hypertension 07/26/2015  . Dysfunction of left eustachian tube 07/26/2015  . Esophageal reflux 07/26/2015  . Fibromyalgia 07/26/2015  . Pure hypercholesterolemia 07/26/2015  . 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 Pes anserinus bursitis of left knee 12/04/2013  . Trochanteric bursitis of left hip 08/07/2013  . Leg swelling 12/06/2011  . Glaucoma 11/02/2011  . HLD (hyperlipidemia) 11/02/2011  . HTN (hypertension) 11/02/2011  . Hypothyroid 11/02/2011  . OA (osteoarthritis) 11/02/2011  . 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 Presence of artificial knee joint 03/16/2011      Laureen Abrahams, PT, DPT 10/27/19 12:35 PM    [facility] Physical Therapy [address] [place], [place], [address] Phone: [phone]   Fax:  [phone]  Name: Barbara Patton MRN: [id number] Date of Birth: Feb 17, 1941

## 2019-10-29 ENCOUNTER — Ambulatory Visit (INDEPENDENT_AMBULATORY_CARE_PROVIDER_SITE_OTHER): Payer: Medicare HMO | Admitting: Physical Therapy

## 2019-10-29 ENCOUNTER — Encounter: Payer: Self-pay | Admitting: Physical Therapy

## 2019-10-29 ENCOUNTER — Other Ambulatory Visit: Payer: Self-pay

## 2019-10-29 DIAGNOSIS — E039 Hypothyroidism, unspecified: Secondary | ICD-10-CM | POA: Diagnosis not present

## 2019-10-29 DIAGNOSIS — Z0000 Encounter for general adult medical examination without abnormal findings: Secondary | ICD-10-CM | POA: Diagnosis not present

## 2019-10-29 DIAGNOSIS — M545 Low back pain, unspecified: Secondary | ICD-10-CM

## 2019-10-29 DIAGNOSIS — M329 Systemic lupus erythematosus, unspecified: Secondary | ICD-10-CM | POA: Diagnosis not present

## 2019-10-29 DIAGNOSIS — G8929 Other chronic pain: Secondary | ICD-10-CM

## 2019-10-29 DIAGNOSIS — Z1389 Encounter for screening for other disorder: Secondary | ICD-10-CM | POA: Diagnosis not present

## 2019-10-29 DIAGNOSIS — H209 Unspecified iridocyclitis: Secondary | ICD-10-CM | POA: Diagnosis not present

## 2019-10-29 DIAGNOSIS — R29898 Other symptoms and signs involving the musculoskeletal system: Secondary | ICD-10-CM | POA: Diagnosis not present

## 2019-10-29 DIAGNOSIS — E7800 Pure hypercholesterolemia, unspecified: Secondary | ICD-10-CM | POA: Diagnosis not present

## 2019-10-29 DIAGNOSIS — Z23 Encounter for immunization: Secondary | ICD-10-CM | POA: Diagnosis not present

## 2019-10-29 DIAGNOSIS — E663 Overweight: Secondary | ICD-10-CM | POA: Diagnosis not present

## 2019-10-29 DIAGNOSIS — I10 Essential (primary) hypertension: Secondary | ICD-10-CM | POA: Diagnosis not present

## 2019-10-29 DIAGNOSIS — I5189 Other ill-defined heart diseases: Secondary | ICD-10-CM | POA: Diagnosis not present

## 2019-10-29 NOTE — Therapy (Signed)
[facility] Physical Therapy [address] [place], [place], [address] Phone: [phone]   Fax:  [phone]  Physical Therapy Treatment  Patient Details  Name: Barbara Patton MRN: [id number] Date of Birth: Sep 30, 1940 Referring Provider (PT): Marlou Sa Tonna Corner, MD   Encounter Date: 10/29/2019  PT End of Session - 10/29/19 1154    Visit Number  7    Number of Visits  12    Date for PT Re-Evaluation  11/19/19    PT Start Time  1150    PT Stop Time  1230    PT Time Calculation (min)  40 min    Activity Tolerance  Patient tolerated treatment well    Behavior During Therapy  [facility] for tasks assessed/performed       Past Medical History:  Diagnosis Date  . High cholesterol   . Hypertension   . Thyroid disease     Past Surgical History:  Procedure Laterality Date  . JOINT REPLACEMENT      There were no vitals filed for this visit.  Subjective Assessment - 10/29/19 1153    Subjective  Pt arriving to therapy reporting 7/10 pain this morning, but reporting no pain upon arrival. Pt stated she rubbed it with CBD oil.    Pertinent History  lupus, fibromyalgia    Limitations  Standing;Walking    How long can you stand comfortably?  unsure - reports it varies    How long can you walk comfortably?  "I can walk"    Diagnostic tests  none recently    Patient Stated Goals  "I want to feel better." decreased pain, especially with getting up in AM    Currently in Pain?  No/denies                        [facility] Adult PT Treatment/Exercise - 10/29/19 0001      Lumbar Exercises: Stretches   Passive Hamstring Stretch  Right;Left;30 seconds;3 reps    Passive Hamstring Stretch Limitations  supine with strap    Single Knee to Chest Stretch  Right;Left;30 seconds;3 reps    Other Lumbar Stretch Exercise  sidelying book openers x 10 reps bil      Lumbar Exercises: Aerobic   Nustep  L6 x 6 min      Lumbar Exercises: Standing   Functional Squats  Limitations  TRX squats 2x10    Row  Strengthening;20 reps;Theraband    Theraband Level (Row)  Level 3 (Green)    Other Standing Lumbar Exercises  deadlifts 10# KB 2x10    Other Standing Lumbar Exercises  walking carring 10# kettle bell in R UE and then L UE.       Lumbar Exercises: Seated   Sit to Stand  20 reps   10# KB     Lumbar Exercises: Supine   Bridge  20 reps;5 seconds                  PT Long Term Goals - 10/16/19 1416      PT LONG TERM GOAL #1   Title  independent with HEP    Status  On-going      PT LONG TERM GOAL #2   Title  report pain < 6/10 when elevated for improved function and mobility    Status  On-going      PT LONG TERM GOAL #3   Title  report 50% improvement in getting out of bed in AM for improved function  Status  On-going            Plan - 10/29/19 1156    Clinical Impression Statement  Pt tolerating exericses well today. Pt reporting mild stiffness and soreness during stretching. Pt feels overall she is improving. Continue skilled PT to progress toward goals set.    Comorbidities  HTN, bil TKA, fibromyalgia, lupus, arthritis    Examination-Activity Limitations  Stand;Locomotion Level;Carry;Lift;Squat    Examination-Participation Restrictions  Community Activity;Cleaning    Rehab Potential  Good    PT Frequency  2x / week    PT Duration  6 weeks    PT Treatment/Interventions  ADLs/Self Care Home Management;Cryotherapy;Electrical Stimulation;Moist Heat;Traction;Therapeutic exercise;Therapeutic activities;Functional mobility training;Stair training;Gait training;Ultrasound;Neuromuscular re-education;Passive range of motion;Dry needling;Taping    PT Next Visit Plan  continue strengthening, DN PRN    PT Home Exercise Plan  Access Code: [email or web address]    Consulted and Agree with Plan of Care  Patient       Patient will benefit from skilled therapeutic intervention in order to improve the following deficits and impairments:  Decreased  strength, Pain, Impaired flexibility  Visit Diagnosis: Chronic midline low back pain without sciatica  Other symptoms and signs involving the musculoskeletal system     Problem List Patient Active Problem List   Diagnosis Date Noted  . 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 Referred otalgia of left ear 08/23/2015  . 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 Allergic rhinitis 07/26/2015  . Anxiety 07/26/2015  . Benign essential hypertension 07/26/2015  . Dysfunction of left eustachian tube 07/26/2015  . Esophageal reflux 07/26/2015  . Fibromyalgia 07/26/2015  . Pure hypercholesterolemia 07/26/2015  . 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 Pes anserinus bursitis of left knee 12/04/2013  . Trochanteric bursitis of left hip 08/07/2013  . Leg swelling 12/06/2011  . Glaucoma 11/02/2011  . HLD (hyperlipidemia) 11/02/2011  . HTN (hypertension) 11/02/2011  . Hypothyroid 11/02/2011  . OA (osteoarthritis) 11/02/2011  . 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 Presence of artificial knee joint 03/16/2011    Oretha Caprice, PT, MPT 10/29/2019, 12:16 PM  [facility] Physical Therapy [address] [place], [place], [address] Phone: [phone]   Fax:  [phone]  Name: Barbara Patton MRN: [id number] Date of Birth: 1940/07/26

## 2019-11-05 ENCOUNTER — Ambulatory Visit (INDEPENDENT_AMBULATORY_CARE_PROVIDER_SITE_OTHER): Payer: Medicare HMO | Admitting: Orthopedic Surgery

## 2019-11-05 ENCOUNTER — Other Ambulatory Visit: Payer: Self-pay

## 2019-11-05 DIAGNOSIS — M545 Low back pain, unspecified: Secondary | ICD-10-CM

## 2019-11-05 DIAGNOSIS — M7551 Bursitis of right shoulder: Secondary | ICD-10-CM

## 2019-11-05 DIAGNOSIS — M79605 Pain in left leg: Secondary | ICD-10-CM

## 2019-11-06 ENCOUNTER — Encounter: Payer: Self-pay | Admitting: Orthopedic Surgery

## 2019-11-06 ENCOUNTER — Telehealth: Payer: Self-pay | Admitting: Physical Medicine and Rehabilitation

## 2019-11-06 DIAGNOSIS — M545 Low back pain: Secondary | ICD-10-CM | POA: Diagnosis not present

## 2019-11-06 DIAGNOSIS — M7551 Bursitis of right shoulder: Secondary | ICD-10-CM

## 2019-11-06 DIAGNOSIS — M79605 Pain in left leg: Secondary | ICD-10-CM | POA: Diagnosis not present

## 2019-11-06 MED ORDER — METHYLPREDNISOLONE ACETATE 40 MG/ML IJ SUSP
40.0000 mg | INTRAMUSCULAR | Status: AC | PRN
  Administered 2019-11-06: 40 mg via INTRA_ARTICULAR

## 2019-11-06 MED ORDER — BUPIVACAINE HCL 0.5 % IJ SOLN
9.0000 mL | INTRAMUSCULAR | Status: AC | PRN
  Administered 2019-11-06: 9 mL via INTRA_ARTICULAR

## 2019-11-06 MED ORDER — LIDOCAINE HCL 1 % IJ SOLN
5.0000 mL | INTRAMUSCULAR | Status: AC | PRN
  Administered 2019-11-06: 5 mL

## 2019-11-06 NOTE — Telephone Encounter (Signed)
Pt is scheduled for 11/27/19

## 2019-11-06 NOTE — Telephone Encounter (Signed)
Patient called. She would like an appointment with Dr. Ernestina Patches. Her call back number is [phone]

## 2019-11-06 NOTE — Telephone Encounter (Signed)
Appears there is referral from [facility] for left hip injection, last was intra-articular, left.

## 2019-11-06 NOTE — Progress Notes (Signed)
Office Visit Note   Patient: Barbara Patton           Date of Birth: 07/02/1940           MRN: [id number] Visit Date: 11/05/2019 Requested by: Seward Carol, MD [address] [place] [address] [place],  [place] [address] PCP: Seward Carol, MD  Subjective: Chief Complaint  Patient presents with   Right Shoulder - Pain   Left Knee - Pain    HPI: Barbara Patton is a 79 year old patient with left knee pain right shoulder pain low back pain and pain where she hurts all over.  She has had prior injection into the right shoulder 03/17/2019.  That gave her pretty good relief for the past 6 months.  She also reports some left hip pain.  Has had left hip injection in the past for early arthritis.  She also reports pain radiating down the leg emanating from the back.  Also there is a component of numbness and tingling and radiculopathy to this as well.  Has not had recent imaging on the back.  Wants to start bowling again.  She has also had left knee revision surgery.  She is concerned about the knee.  Previous radiographs several months ago were normal.              ROS: All systems reviewed are negative as they relate to the chief complaint within the history of present illness.  Patient denies  fevers or chills.   Assessment & Plan: Visit Diagnoses:  1. Pain in left leg   2. Bursitis of right shoulder   3. Low back pain, unspecified back pain laterality, unspecified chronicity, unspecified whether sciatica present     Plan: Impression is right shoulder bursitis and rotator cuff arthropathy.  Right shoulder injection performed today.  That has helped her in the past.  I would like to send her to Dr. Ernestina Patches for left hip injection as that has helped her in the past as well for 3 or 4 weeks.  I think she may also have an early component of radiculopathy with low back pain and pain radiating down the left leg with numbness and tingling.  No discrete weakness today.  Has been going on for over 6 weeks and  has failed conservative management.  MRI scan with ESI's likely to follow.  For left radiculopathy.  Follow-Up Instructions: Return for after MRI.   Orders:  Orders Placed This Encounter  Procedures   MR Lumbar Spine w/o contrast   Ambulatory referral to Physical Medicine Rehab   No orders of the defined types were placed in this encounter.     Procedures: Large Joint Inj: R subacromial bursa on 11/06/2019 11:18 PM Indications: diagnostic evaluation and pain Details: 18 G 1.5 in needle, posterior approach  Arthrogram: No  Medications: 9 mL bupivacaine 0.5 %; 40 mg methylPREDNISolone acetate 40 MG/ML; 5 mL lidocaine 1 % Outcome: tolerated well, no immediate complications Procedure, treatment alternatives, risks and benefits explained, specific risks discussed. Consent was given by the patient. Immediately prior to procedure a time out was called to verify the correct patient, procedure, equipment, support staff and site/side marked as required. Patient was prepped and draped in the usual sterile fashion.       Clinical Data: No additional findings.  Objective: Vital Signs: There were no vitals taken for this visit.  Physical Exam:   Constitutional: Patient appears well-developed HEENT:  Head: Normocephalic Eyes:EOM are normal Neck: Normal range of motion Cardiovascular:  Normal rate Pulmonary/chest: Effort normal Neurologic: Patient is alert Skin: Skin is warm Psychiatric: Patient has normal mood and affect    Ortho Exam: Ortho exam demonstrates range of motion left knee 0-90 with no effusion intact extensor mechanism.  Mild groin pain with internal/external rotation of the left compared to the right.  No nerve root tension signs on the left.  Pedal pulses palpable.  Reflexes symmetric bilateral patella and Achilles 1+ out of 4.  No other masses lymphadenopathy or skin changes noted in that left knee region.  Left knee itself is not warm.  Right shoulder demonstrates  some mild coarseness and grinding with some weakness to infraspinatus and supraspinatus testing.  Deltoid is functional.  Patient is able to get her hand to the top of her head.  Subscap strength is intact.  Specialty Comments:  No specialty comments available.  Imaging: No results found.   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 05/03/2014   Pes anserinus bursitis of left knee 12/04/2013   Trochanteric bursitis of left hip 08/07/2013   Leg swelling 12/06/2011   Glaucoma 11/02/2011   HLD (hyperlipidemia) 11/02/2011   HTN (hypertension) 11/02/2011   Hypothyroid 11/02/2011   OA (osteoarthritis) 11/02/2011   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 ([place]) 03/16/2011   Presence of artificial knee joint 03/16/2011   Past Medical History:  Diagnosis Date   High cholesterol    Hypertension    Thyroid disease     History reviewed. No pertinent family history.  Past Surgical History:  Procedure Laterality Date   JOINT REPLACEMENT     Social History   Occupational History   Not on file  Tobacco Use   Smoking status: Former Smoker    Quit date: 1997    Years since quitting: 24.4   Smokeless tobacco: Never Used  Substance and Sexual Activity   Alcohol use: Yes    Alcohol/week: 1.0 standard drinks    Types: 1 Glasses of wine per week    Comment: occ wine   Drug use: No   Sexual activity: Not on file

## 2019-11-06 NOTE — Telephone Encounter (Signed)
Pt states pain in the left hip/ bursa (no groin pain) ok to schedule for left bursa? Please Advise

## 2019-11-09 ENCOUNTER — Encounter (HOSPITAL_COMMUNITY): Payer: Self-pay

## 2019-11-09 ENCOUNTER — Other Ambulatory Visit: Payer: Self-pay

## 2019-11-09 ENCOUNTER — Ambulatory Visit (INDEPENDENT_AMBULATORY_CARE_PROVIDER_SITE_OTHER): Payer: 59

## 2019-11-09 ENCOUNTER — Ambulatory Visit (HOSPITAL_COMMUNITY)
Admission: EM | Admit: 2019-11-09 | Discharge: 2019-11-09 | Disposition: A | Discharge status: Home / Self Care | Payer: 59

## 2019-11-09 DIAGNOSIS — M25511 Pain in right shoulder: Secondary | ICD-10-CM

## 2019-11-09 DIAGNOSIS — S46001A Unspecified injury of muscle(s) and tendon(s) of the rotator cuff of right shoulder, initial encounter: Secondary | ICD-10-CM

## 2019-11-09 NOTE — Discharge Instructions (Signed)
Please follow-up with your orthopedist this upcoming week. Please just use Tylenol at a dose of 500mg -650mg  once every 6 hours as needed for your aches, pains, fevers. Do not use any nonsteroidal anti-inflammatories (NSAIDs) like ibuprofen, Motrin, naproxen, Aleve, etc. which are all available over-the-counter.

## 2019-11-09 NOTE — ED Provider Notes (Signed)
[facility]   MRN: [id number] DOB: 02/23/1941  Subjective:   Barbara Patton is a 79 y.o. female presenting for right shoulder injury suffered last night.  Patient was sitting down on a table and accidentally fell very hard against a table.  She has since had constant and severe sharp shoulder pain, difficulty lifting her arm.  She states that the entire upper arm hurts but is worst at the shoulder.  Has a history of rotator cuff injury, just had a shoulder injection about a week ago.  Denies bruising, swelling, bony deformity.  No current facility-administered medications for this encounter.  Current Outpatient Medications:  .  acetaminophen (TYLENOL) 325 MG tablet, Take 650 mg by mouth 2 (two) times daily as needed (pain)., Disp: , Rfl:  .  Ascorbic Acid (VITAMIN C PO), Take 1 tablet by mouth daily., Disp: , Rfl:  .  Cholecalciferol (VITAMIN D-3 PO), Take 1 tablet by mouth daily., Disp: , Rfl:  .  Coenzyme Q10 (CO Q 10 PO), Take 1 tablet by mouth daily., Disp: , Rfl:  .  hydrochlorothiazide (HYDRODIURIL) 25 MG tablet, Take 25 mg by mouth daily., Disp: , Rfl:  .  hydroxychloroquine (PLAQUENIL) 200 MG tablet, , Disp: , Rfl:  .  hydrOXYzine (ATARAX/VISTARIL) 10 MG tablet, , Disp: , Rfl:  .  ibuprofen (ADVIL,MOTRIN) 600 MG tablet, TAKE 1 TABLET BY MOUTH THREE TIMES DAILY WITH MEALS FOR 14 DAYS, Disp: , Rfl: 0 .  levothyroxine (SYNTHROID, LEVOTHROID) 112 MCG tablet, Take 112 mcg by mouth daily., Disp: , Rfl:  .  methylPREDNISolone (MEDROL) 4 MG tablet, 6 Day taper, take as directed (Patient not taking: Reported on 08/06/2019), Disp: 21 tablet, Rfl: 0 .  Multiple Vitamin (MULTI-VITAMINS) TABS, Take by mouth., Disp: , Rfl:  .  Multiple Vitamins-Minerals (MULTIVITAMIN PO), Take 1 tablet by mouth daily., Disp: , Rfl:  .  Omega-3 Fatty Acids (FISH OIL PO), Take 3 capsules by mouth daily., Disp: , Rfl:  .  omeprazole (PRILOSEC) 40 MG capsule, Take 40 mg by mouth., Disp: , Rfl:  .   simvastatin (ZOCOR) 20 MG tablet, Take 20 mg by mouth daily., Disp: , Rfl:  .  timolol (TIMOPTIC) 0.5 % ophthalmic solution, Add'l Sig Add'l Sig ophthalmic (eye) Add'l Sig, Disp: , Rfl:  .  tolterodine (DETROL LA) 4 MG 24 hr capsule, TAKE 1 CAPSULE BY MOUTH ONCE DAILY FOR 30 DAYS, Disp: , Rfl: 2 .  traMADol (ULTRAM) 50 MG tablet, Take 50 mg by mouth 2 (two) times daily as needed (pain). , Disp: , Rfl:    Allergies  Allergen Reactions  . Codeine Other (See Comments)    Kept awake  . Penicillins Hives    Past Medical History:  Diagnosis Date  . High cholesterol   . Hypertension   . Thyroid disease      Past Surgical History:  Procedure Laterality Date  . JOINT REPLACEMENT      History reviewed. No pertinent family history.  Social History   Tobacco Use  . Smoking status: Former Smoker    Quit date: 1997    Years since quitting: 24.4  . Smokeless tobacco: Never Used  Substance Use Topics  . Alcohol use: Yes    Alcohol/week: 1.0 standard drinks    Types: 1 Glasses of wine per week    Comment: occ wine  . Drug use: No    ROS   Objective:   Vitals: BP (!) 149/79   Pulse 78   Temp  98.2 F (36.8 C) (Oral)   Resp 16   Ht 5' 6.5" (1.689 m)   Wt 190 lb (86.2 kg)   SpO2 95%   BMI 30.21 kg/m   Physical Exam Constitutional:      General: She is not in acute distress.    Appearance: Normal appearance. She is well-developed. She is not ill-appearing, toxic-appearing or diaphoretic.  HENT:     Head: Normocephalic and atraumatic.     Nose: Nose normal.     Mouth/Throat:     Mouth: Mucous membranes are moist.     Pharynx: Oropharynx is clear.  Eyes:     General: No scleral icterus.    Extraocular Movements: Extraocular movements intact.     Pupils: Pupils are equal, round, and reactive to light.  Cardiovascular:     Rate and Rhythm: Normal rate.  Pulmonary:     Effort: Pulmonary effort is normal.  Musculoskeletal:     Right shoulder: Tenderness (shoulder) and  bony tenderness present. No swelling, deformity, effusion, laceration or crepitus. Decreased range of motion (active not passive). Normal strength.     Right upper arm: Tenderness present. No swelling, edema, deformity, lacerations or bony tenderness.     Right elbow: No swelling, deformity, effusion or lacerations. Normal range of motion. No tenderness.  Skin:    General: Skin is warm and dry.  Neurological:     General: No focal deficit present.     Mental Status: She is alert and oriented to person, place, and time.  Psychiatric:        Mood and Affect: Mood normal.        Behavior: Behavior normal.     DG Shoulder Right  Result Date: 11/09/2019 CLINICAL DATA:  Per pt: hit the right shoulder on the table last night, freak accident. Pain is the right shoulder, anterior and lateral humeral head, stated she can't raise the right arm. Pain radiates to the mid right forearm. EXAM: RIGHT SHOULDER - 2+ VIEW COMPARISON:  03/17/2019 FINDINGS: There is no acute fracture or subluxation. There is significant subacromial narrowing, associated with osteophytes. Degenerative changes are identified along the LOWER aspect of the glenohumeral joint. These findings are consistent with chronic rotator cuff injury. The RIGHT lung apex is unremarkable. IMPRESSION: 1. 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 2. No evidence for acute abnormality. Electronically Signed   By: Nolon Nations M.D.   On: 11/09/2019 15:46     Assessment and Plan :   PDMP not reviewed this encounter.  1. Acute pain of right shoulder   2. Injury of right rotator cuff, initial encounter     Patient just received a steroid injection.  Recommended follow-up with her orthopedist this upcoming week.  Schedule Tylenol. Counseled patient on potential for adverse effects with medications prescribed/recommended today, ER and return-to-clinic precautions discussed, patient verbalized understanding.    Jaynee Eagles,  [place] 11/09/19 1554

## 2019-11-09 NOTE — ED Triage Notes (Signed)
Pt states she hit hurt right shoulder on the table last night. Pt c/o 10/10 sharp pain in right shoulder. Pt unable to lift right arm up.

## 2019-11-10 ENCOUNTER — Ambulatory Visit (INDEPENDENT_AMBULATORY_CARE_PROVIDER_SITE_OTHER): Payer: Medicare HMO | Admitting: Physical Therapy

## 2019-11-10 ENCOUNTER — Encounter: Payer: Self-pay | Admitting: Physical Therapy

## 2019-11-10 DIAGNOSIS — M545 Low back pain, unspecified: Secondary | ICD-10-CM

## 2019-11-10 DIAGNOSIS — R29898 Other symptoms and signs involving the musculoskeletal system: Secondary | ICD-10-CM

## 2019-11-10 DIAGNOSIS — G8929 Other chronic pain: Secondary | ICD-10-CM

## 2019-11-10 NOTE — Therapy (Signed)
[facility] [address] [place], [place], [address] Phone: [phone]   Fax:  [phone]  Physical Therapy Treatment  Patient Details  Name: Barbara Patton MRN: [id number] Date of Birth: [birth year] Referring Provider (PT): Marlou Sa Tonna Corner, MD   Encounter Date: 11/10/2019  PT End of Session - 11/10/19 1341    Visit Number  8    Number of Visits  12    Date for PT Re-Evaluation  11/19/19    PT Start Time  1300    PT Stop Time  1341    PT Time Calculation (min)  41 min    Activity Tolerance  Patient tolerated treatment well    Behavior During Therapy  [facility] for tasks assessed/performed       Past Medical History:  Diagnosis Date   High cholesterol    Hypertension    Thyroid disease     Past Surgical History:  Procedure Laterality Date   JOINT REPLACEMENT      There were no vitals filed for this visit.  Subjective Assessment - 11/10/19 1303    Subjective  back is doing "good" but not great.  reports pain is fairly minimal in her back.  went to UC yesterday due to Rt shoulder injury - having trouble lifting RUE now.    Pertinent History  lupus, fibromyalgia    Limitations  Standing;Walking    How long can you stand comfortably?  unsure - reports it varies    How long can you walk comfortably?  "I can walk"    Diagnostic tests  none recently    Patient Stated Goals  "I want to feel better." decreased pain, especially with getting up in AM    Currently in Pain?  No/denies                        [facility] Adult PT Treatment/Exercise - 11/10/19 1306      Lumbar Exercises: Stretches   Passive Hamstring Stretch  Right;Left;30 seconds;3 reps    Passive Hamstring Stretch Limitations  seated    Piriformis Stretch  Right;Left;3 reps;30 seconds    Piriformis Stretch Limitations  seated      Lumbar Exercises: Aerobic   Recumbent Bike  L1 x 8 min      Lumbar Exercises: Seated   Other Seated Lumbar Exercises  Lt hip  flexion x 20 reps      Lumbar Exercises: Sidelying   Clam  Left;20 reps    Hip Abduction  Left;10 reps    Hip Abduction Limitations  pt reported incr strain on neck so stopped      Manual Therapy   Manual Therapy  Soft tissue mobilization    Manual therapy comments  skilled palpation and monitoring of soft tissue during DN    Soft tissue mobilization  Lt glutes and piriformis       Trigger Point Dry Needling - 11/10/19 1334    Consent Given?  Yes    Education Handout Provided  Previously provided    Muscles Treated Back/Hip  Gluteus medius;Gluteus maximus;Piriformis    Gluteus Medius Response  Twitch response elicited;Palpable increased muscle length    Gluteus Maximus Response  Twitch response elicited;Palpable increased muscle length    Piriformis Response  Twitch response elicited;Palpable increased muscle length                PT Long Term Goals - 10/16/19 1416      PT LONG TERM GOAL #  1   Title  independent with HEP    Status  On-going      PT LONG TERM GOAL #2   Title  report pain < 6/10 when elevated for improved function and mobility    Status  On-going      PT LONG TERM GOAL #3   Title  report 50% improvement in getting out of bed in AM for improved function    Status  On-going            Plan - 11/10/19 1341    Clinical Impression Statement  Pt reporting decreased back pain and feels like she will be ready for d/c next week.  New shoulder injury shows limited active ROM and advised her to follow up with Dr. Marlou Sa if no improvement in next couple of days.    Comorbidities  HTN, bil TKA, fibromyalgia, lupus, arthritis    Examination-Activity Limitations  Stand;Locomotion Level;Carry;Lift;Squat    Examination-Participation Restrictions  Community Activity;Cleaning    Rehab Potential  Good    PT Frequency  2x / week    PT Duration  6 weeks    PT Treatment/Interventions  ADLs/Self Care Home Management;Cryotherapy;Electrical Stimulation;Moist  Heat;Traction;Therapeutic exercise;Therapeutic activities;Functional mobility training;Stair training;Gait training;Ultrasound;Neuromuscular re-education;Passive range of motion;Dry needling;Taping    PT Next Visit Plan  continue strengthening, DNassess response to DN    PT Home Exercise Plan  Access Code: [email or web address]    Consulted and Agree with Plan of Care  Patient       Patient will benefit from skilled therapeutic intervention in order to improve the following deficits and impairments:  Decreased strength, Pain, Impaired flexibility  Visit Diagnosis: Chronic midline low back pain without sciatica  Other symptoms and signs involving the musculoskeletal system     Problem List 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 05/03/2014   Pes anserinus bursitis of left knee 12/04/2013   Trochanteric bursitis of left hip 08/07/2013   Leg swelling 12/06/2011   Glaucoma 11/02/2011   HLD (hyperlipidemia) 11/02/2011   HTN (hypertension) 11/02/2011   Hypothyroid 11/02/2011   OA (osteoarthritis) 11/02/2011   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 ([place]) 03/16/2011   Presence of artificial knee joint 03/16/2011      Laureen Abrahams, PT, DPT 11/10/19 1:43 PM    [facility] [address] [place], [place], [address] Phone: [phone]   Fax:  [phone]  Name: DELILA KUKLINSKI MRN: [id number] Date of Birth: January 20, 1941

## 2019-11-12 ENCOUNTER — Ambulatory Visit (INDEPENDENT_AMBULATORY_CARE_PROVIDER_SITE_OTHER): Payer: Medicare HMO | Admitting: Physical Therapy

## 2019-11-12 ENCOUNTER — Other Ambulatory Visit: Payer: Self-pay

## 2019-11-12 ENCOUNTER — Encounter: Payer: Self-pay | Admitting: Physical Therapy

## 2019-11-12 DIAGNOSIS — R29898 Other symptoms and signs involving the musculoskeletal system: Secondary | ICD-10-CM | POA: Diagnosis not present

## 2019-11-12 DIAGNOSIS — G8929 Other chronic pain: Secondary | ICD-10-CM | POA: Diagnosis not present

## 2019-11-12 DIAGNOSIS — M545 Low back pain: Secondary | ICD-10-CM

## 2019-11-12 NOTE — Therapy (Signed)
[facility] [address] [place], [place], [address] Phone: [phone]   Fax:  [phone]  Physical Therapy Treatment  Patient Details  Name: Barbara Patton MRN: [id number] Date of Birth: 1941/01/18 Referring Provider (PT): Marlou Sa Tonna Corner, MD   Encounter Date: 11/12/2019  PT End of Session - 11/12/19 1339    Visit Number  9    Number of Visits  12    Date for PT Re-Evaluation  11/19/19    PT Start Time  1300    PT Stop Time  1339    PT Time Calculation (min)  39 min    Activity Tolerance  Patient tolerated treatment well    Behavior During Therapy  [facility] for tasks assessed/performed       Past Medical History:  Diagnosis Date   High cholesterol    Hypertension    Thyroid disease     Past Surgical History:  Procedure Laterality Date   JOINT REPLACEMENT      There were no vitals filed for this visit.  Subjective Assessment - 11/12/19 1303    Subjective  shoulder is still a little painful, but feels like ROM is getting better.  hasn't had any back pain since last visit.    Pertinent History  lupus, fibromyalgia    Limitations  Standing;Walking    How long can you stand comfortably?  unsure - reports it varies    How long can you walk comfortably?  "I can walk"    Diagnostic tests  none recently    Patient Stated Goals  "I want to feel better." decreased pain, especially with getting up in AM    Currently in Pain?  No/denies                        [facility] Adult PT Treatment/Exercise - 11/12/19 1304      Lumbar Exercises: Stretches   Passive Hamstring Stretch  Right;Left;30 seconds;3 reps    Passive Hamstring Stretch Limitations  seated    Quadruped Mid Back Stretch  3 reps;20 seconds   seated ball roll   Piriformis Stretch  Right;Left;3 reps;30 seconds    Piriformis Stretch Limitations  seated    Other Lumbar Stretch Exercise  thoracic extension 10x5 sec      Lumbar Exercises: Aerobic   Nustep  L6 x  6 min      Lumbar Exercises: Machines for Strengthening   Cybex Knee Extension  15# 3x10    Cybex Knee Flexion  20# 3x10    Other Lumbar Machine Exercise  rows 20# 3x10      Lumbar Exercises: Standing   Heel Raises  20 reps    Heel Raises Limitations  light UE support    Other Standing Lumbar Exercises  deadlifts 12# KB 2x10      Lumbar Exercises: Seated   Sit to Stand  20 reps   without UE support                 PT Long Term Goals - 10/16/19 1416      PT LONG TERM GOAL #1   Title  independent with HEP    Status  On-going      PT LONG TERM GOAL #2   Title  report pain < 6/10 when elevated for improved function and mobility    Status  On-going      PT LONG TERM GOAL #3   Title  report 50% improvement in getting  out of bed in AM for improved function    Status  On-going            Plan - 11/12/19 1340    Clinical Impression Statement  Pt reports no pain following last session and overall doing well.  Tolerated session well today, and anticipate d/c next week.    Comorbidities  HTN, bil TKA, fibromyalgia, lupus, arthritis    Examination-Activity Limitations  Stand;Locomotion Level;Carry;Lift;Squat    Examination-Participation Restrictions  Community Activity;Cleaning    Rehab Potential  Good    PT Frequency  2x / week    PT Duration  6 weeks    PT Treatment/Interventions  ADLs/Self Care Home Management;Cryotherapy;Electrical Stimulation;Moist Heat;Traction;Therapeutic exercise;Therapeutic activities;Functional mobility training;Stair training;Gait training;Ultrasound;Neuromuscular re-education;Passive range of motion;Dry needling;Taping    PT Next Visit Plan  plan for d/c, check goals    PT Home Exercise Plan  Access Code: [email or web address]    Consulted and Agree with Plan of Care  Patient       Patient will benefit from skilled therapeutic intervention in order to improve the following deficits and impairments:  Decreased strength, Pain, Impaired  flexibility  Visit Diagnosis: Other symptoms and signs involving the musculoskeletal system  Chronic midline low back pain without sciatica     Problem List 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 05/03/2014   Pes anserinus bursitis of left knee 12/04/2013   Trochanteric bursitis of left hip 08/07/2013   Leg swelling 12/06/2011   Glaucoma 11/02/2011   HLD (hyperlipidemia) 11/02/2011   HTN (hypertension) 11/02/2011   Hypothyroid 11/02/2011   OA (osteoarthritis) 11/02/2011   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 ([place]) 03/16/2011   Presence of artificial knee joint 03/16/2011      Laureen Abrahams, PT, DPT 11/12/19 1:41 PM    [facility] [address] [place], [place], [address] Phone: [phone]   Fax:  [phone]  Name: SHAYMA PFEFFERLE MRN: [id number] Date of Birth: 01-20-1941

## 2019-11-17 ENCOUNTER — Encounter: Payer: Self-pay | Admitting: Physical Therapy

## 2019-11-17 ENCOUNTER — Ambulatory Visit (INDEPENDENT_AMBULATORY_CARE_PROVIDER_SITE_OTHER): Payer: Medicare HMO | Admitting: Physical Therapy

## 2019-11-17 ENCOUNTER — Other Ambulatory Visit: Payer: Self-pay

## 2019-11-17 DIAGNOSIS — G8929 Other chronic pain: Secondary | ICD-10-CM | POA: Diagnosis not present

## 2019-11-17 DIAGNOSIS — R29898 Other symptoms and signs involving the musculoskeletal system: Secondary | ICD-10-CM | POA: Diagnosis not present

## 2019-11-17 DIAGNOSIS — M545 Low back pain: Secondary | ICD-10-CM | POA: Diagnosis not present

## 2019-11-17 NOTE — Therapy (Signed)
[facility] [address] [place], [place], [address] Phone: [phone]   Fax:  [phone]  Physical Therapy Treatment/Progress Note  Patient Details  Name: Barbara Patton MRN: [id number] Date of Birth: 1940-10-20 Referring Provider (PT): Marlou Sa Tonna Corner, MD  Progress Note Reporting Period 08/06/19 to 11/17/19  See note below for Objective Data and Assessment of Progress/Goals.      Encounter Date: 11/17/2019   PT End of Session - 11/17/19 1352    Visit Number 10    Number of Visits 12    Date for PT Re-Evaluation 11/19/19    PT Start Time 1300    PT Stop Time 1341    PT Time Calculation (min) 41 min    Activity Tolerance Patient tolerated treatment well    Behavior During Therapy [facility] for tasks assessed/performed           Past Medical History:  Diagnosis Date  . High cholesterol   . Hypertension   . Thyroid disease     Past Surgical History:  Procedure Laterality Date  . JOINT REPLACEMENT      There were no vitals filed for this visit.   Subjective Assessment - 11/17/19 1256    Subjective shoulder isn't doing much better - sees Dr. Marlou Sa next week. Lt hip is flared up today, has injection scheduled for next week    Pertinent History lupus, fibromyalgia    Limitations Standing;Walking    How long can you stand comfortably? unsure - reports it varies    How long can you walk comfortably? "I can walk"    Diagnostic tests none recently    Patient Stated Goals "I want to feel better." decreased pain, especially with getting up in AM    Currently in Pain? Yes    Pain Score 7     Pain Location Back    Pain Orientation Left    Pain Descriptors / Indicators Constant;Aching    Pain Type Acute pain    Pain Onset Yesterday    Pain Frequency Constant    Aggravating Factors  standing, walking    Pain Relieving Factors sitting    Multiple Pain Sites Yes    Pain Score 8    Pain Location Shoulder    Pain Orientation Right     Pain Descriptors / Indicators Stabbing    Pain Type Acute pain    Pain Onset 1 to 4 weeks ago    Aggravating Factors  abduction, flexion    Pain Relieving Factors rest, immobilization              [facility] PT Assessment - 11/17/19 1320      Assessment   Medical Diagnosis M54.5 (ICD-10-CM) - Low back pain, unspecified back pain laterality, unspecified chronicity, unspecified whether sciatica present    Referring Provider (PT) Meredith Pel, MD      Strength   Strength Assessment Site Shoulder    Right/Left Shoulder Right    Right Shoulder Flexion 3-/5    Right Shoulder ABduction 3-/5    Right Shoulder Internal Rotation 5/5    Right Shoulder External Rotation 3/5      Special Tests    Special Tests Rotator Cuff Impingement    Rotator Cuff Impingment tests Empty Can test;Full Can test;Drop Arm test      Empty Can test   Findings Negative    Comment incr pain on Rt; mild weakness present      Full Can test   Findings Positive  Side Right      Drop Arm test   Findings Negative    Side Right                         [facility] Adult PT Treatment/Exercise - 11/17/19 1306      Lumbar Exercises: Stretches   Passive Hamstring Stretch Right;Left;30 seconds;3 reps    Passive Hamstring Stretch Limitations seated    Piriformis Stretch Right;Left;3 reps;30 seconds    Piriformis Stretch Limitations seated      Lumbar Exercises: Aerobic   Nustep L6 x 6 min      Manual Therapy   Manual therapy comments skilled palpation and monitoring of soft tissue during DN    Soft tissue mobilization Lt glutes and piriformis            Trigger Point Dry Needling - 11/17/19 1351    Consent Given? Yes    Education Handout Provided Previously provided    Muscles Treated Back/Hip Gluteus medius;Gluteus maximus    Gluteus Medius Response Twitch response elicited;Palpable increased muscle length    Gluteus Maximus Response Twitch response elicited;Palpable increased muscle  length                     PT Long Term Goals - 11/17/19 1415      PT LONG TERM GOAL #1   Title independent with HEP    Status On-going    Target Date 11/19/19      PT LONG TERM GOAL #2   Title report pain < 6/10 when elevated for improved function and mobility    Status Partially Met      PT LONG TERM GOAL #3   Title report 50% improvement in getting out of bed in AM for improved function    Status Achieved                 Plan - 11/17/19 1415    Clinical Impression Statement Pt has met 1 LTG and would have met pain goal except with recent flare up causing increased pain.  Still hopeful for d/c next visit but will plan to reassess and see if symptoms have improved.    Comorbidities HTN, bil TKA, fibromyalgia, lupus, arthritis    Examination-Activity Limitations Stand;Locomotion Level;Carry;Lift;Squat    Examination-Participation Restrictions Community Activity;Cleaning    Rehab Potential Good    PT Frequency 2x / week    PT Duration 6 weeks    PT Treatment/Interventions ADLs/Self Care Home Management;Cryotherapy;Electrical Stimulation;Moist Heat;Traction;Therapeutic exercise;Therapeutic activities;Functional mobility training;Stair training;Gait training;Ultrasound;Neuromuscular re-education;Passive range of motion;Dry needling;Taping    PT Next Visit Plan see how she's doing, recert or d/c    PT Home Exercise Plan Access Code: [email or web address]    Consulted and Agree with Plan of Care Patient           Patient will benefit from skilled therapeutic intervention in order to improve the following deficits and impairments:  Decreased strength, Pain, Impaired flexibility  Visit Diagnosis: Other symptoms and signs involving the musculoskeletal system  Chronic midline low back pain without sciatica     Problem List Patient Active Problem List   Diagnosis Date Noted  . 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 Referred otalgia of left ear 08/23/2015  .  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 Allergic rhinitis 07/26/2015  . Anxiety 07/26/2015  . Benign essential hypertension 07/26/2015  . Dysfunction of left eustachian tube 07/26/2015  . Esophageal reflux 07/26/2015  .  Fibromyalgia 07/26/2015  . Pure hypercholesterolemia 07/26/2015  . 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 Pes anserinus bursitis of left knee 12/04/2013  . Trochanteric bursitis of left hip 08/07/2013  . Leg swelling 12/06/2011  . Glaucoma 11/02/2011  . HLD (hyperlipidemia) 11/02/2011  . HTN (hypertension) 11/02/2011  . Hypothyroid 11/02/2011  . OA (osteoarthritis) 11/02/2011  . 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 ([place]) 03/16/2011  . Presence of artificial knee joint 03/16/2011      Laureen Abrahams, PT, DPT 11/17/19 2:20 PM     [facility] [address] [place], [place], [address] Phone: [phone]   Fax:  [phone]  Name: Barbara Patton MRN: [id number] Date of Birth: December 03, 1940

## 2019-11-19 ENCOUNTER — Ambulatory Visit (INDEPENDENT_AMBULATORY_CARE_PROVIDER_SITE_OTHER): Payer: Medicare HMO | Admitting: Physical Therapy

## 2019-11-19 ENCOUNTER — Encounter: Payer: Self-pay | Admitting: Physical Therapy

## 2019-11-19 ENCOUNTER — Encounter: Payer: 59 | Admitting: Physical Therapy

## 2019-11-19 ENCOUNTER — Other Ambulatory Visit: Payer: Self-pay

## 2019-11-19 DIAGNOSIS — G8929 Other chronic pain: Secondary | ICD-10-CM

## 2019-11-19 DIAGNOSIS — R29898 Other symptoms and signs involving the musculoskeletal system: Secondary | ICD-10-CM

## 2019-11-19 DIAGNOSIS — M545 Low back pain, unspecified: Secondary | ICD-10-CM

## 2019-11-19 NOTE — Therapy (Addendum)
[facility] [place], [place], [address] Phone: [phone]   Fax:  [phone]  Physical Therapy Treatment/Discharge Summary  Patient Details  Name: Barbara Patton MRN: [id number] Date of Birth: 1940/07/21 Referring Provider (PT): Marlou Sa Tonna Corner, MD   Encounter Date: 11/19/2019   PT End of Session - 11/19/19 1137    Visit Number 11    Number of Visits 12    Date for PT Re-Evaluation 11/19/19    PT Start Time 1057    PT Stop Time 1136    PT Time Calculation (min) 39 min    Activity Tolerance Patient tolerated treatment well    Behavior During Therapy [facility] for tasks assessed/performed           Past Medical History:  Diagnosis Date  . High cholesterol   . Hypertension   . Thyroid disease     Past Surgical History:  Procedure Laterality Date  . JOINT REPLACEMENT      There were no vitals filed for this visit.   Subjective Assessment - 11/19/19 1056    Subjective not feeling much better in her back, wants to wait and see what the doc says before scheduling any more PT appts.    Pertinent History lupus, fibromyalgia    Limitations Standing;Walking    How long can you stand comfortably? unsure - reports it varies    How long can you walk comfortably? "I can walk"    Diagnostic tests none recently    Patient Stated Goals "I want to feel better." decreased pain, especially with getting up in AM    Currently in Pain? Yes    Pain Score 7     Pain Location Hip    Pain Orientation Left    Pain Descriptors / Indicators Aching;Constant    Pain Type Acute pain    Pain Onset Yesterday    Pain Frequency Constant    Aggravating Factors  standing, walkin    Pain Relieving Factors sitting    Pain Onset 1 to 4 weeks ago                             [facility] Adult PT Treatment/Exercise - 11/19/19 1107      Lumbar Exercises: Stretches   Passive Hamstring Stretch Right;Left;30 seconds;3 reps    Passive  Hamstring Stretch Limitations seated    Single Knee to Chest Stretch Right;Left;30 seconds;3 reps    Double Knee to Chest Stretch 3 reps;30 seconds    Piriformis Stretch Right;Left;3 reps;30 seconds    Piriformis Stretch Limitations seated      Lumbar Exercises: Aerobic   Nustep L6 x 7 min      Lumbar Exercises: Seated   Sit to Stand 20 reps      Lumbar Exercises: Supine   Pelvic Tilt 10 reps;5 seconds    Clam 20 reps   blue band, single limb   Bridge 20 reps;5 seconds                       PT Long Term Goals - 11/19/19 1137      PT LONG TERM GOAL #1   Title independent with HEP    Status Achieved      PT LONG TERM GOAL #2   Title report pain < 6/10 when elevated for improved function and mobility    Status Partially Met      PT LONG  TERM GOAL #3   Title report 50% improvement in getting out of bed in AM for improved function    Status Achieved                 Plan - 11/19/19 1138    Clinical Impression Statement Pt has met 2/3 LTGs and partially met LTG #2.  She has had recent flare up after demonstrating initially great progress with PT.  She's requesting to hold for now until after she sees MD(s) next week.  Will see what MD says after next week.    Comorbidities HTN, bil TKA, fibromyalgia, lupus, arthritis    Examination-Activity Limitations Stand;Locomotion Level;Carry;Lift;Squat    Examination-Participation Restrictions Community Activity;Cleaning    Rehab Potential Good    PT Frequency 2x / week    PT Duration 6 weeks    PT Treatment/Interventions ADLs/Self Care Home Management;Cryotherapy;Electrical Stimulation;Moist Heat;Traction;Therapeutic exercise;Therapeutic activities;Functional mobility training;Stair training;Gait training;Ultrasound;Neuromuscular re-education;Passive range of motion;Dry needling;Taping    PT Next Visit Plan hold PT for now, will need recert if she returns    PT Home Exercise Plan Access Code: [email or web address]    Consulted and  Agree with Plan of Care Patient           Patient will benefit from skilled therapeutic intervention in order to improve the following deficits and impairments:  Decreased strength, Pain, Impaired flexibility  Visit Diagnosis: Other symptoms and signs involving the musculoskeletal system  Chronic midline low back pain without sciatica     Problem List Patient Active Problem List   Diagnosis Date Noted  . 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 Referred otalgia of left ear 08/23/2015  . 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 Allergic rhinitis 07/26/2015  . Anxiety 07/26/2015  . Benign essential hypertension 07/26/2015  . Dysfunction of left eustachian tube 07/26/2015  . Esophageal reflux 07/26/2015  . Fibromyalgia 07/26/2015  . Pure hypercholesterolemia 07/26/2015  . 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 Pes anserinus bursitis of left knee 12/04/2013  . Trochanteric bursitis of left hip 08/07/2013  . Leg swelling 12/06/2011  . Glaucoma 11/02/2011  . HLD (hyperlipidemia) 11/02/2011  . HTN (hypertension) 11/02/2011  . Hypothyroid 11/02/2011  . OA (osteoarthritis) 11/02/2011  . 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 ([place]) 03/16/2011  . Presence of artificial knee joint 03/16/2011    Faustino Congress, PT, DPT 11/19/2019, 11:39 AM  [facility] [address] [place], [place], [address] Phone: [phone]   Fax:  [phone]  Name: ANNIYA WHITERS MRN: [id number] Date of Birth: 1941/02/17     PHYSICAL THERAPY DISCHARGE SUMMARY  Visits from Start of Care: 11  Current functional level related to goals / functional outcomes: See above   Remaining deficits: See above   Education / Equipment: HEP  Plan: Patient agrees to discharge.  Patient goals were partially met. Patient is being discharged due to not returning  since the last visit.  ?????    Laureen Abrahams, PT, DPT 01/14/20 2:06 PM [facility] [address] [place], [place], [address] Phone: [phone]   Fax:  [phone]

## 2019-11-24 ENCOUNTER — Ambulatory Visit (INDEPENDENT_AMBULATORY_CARE_PROVIDER_SITE_OTHER): Payer: Medicare HMO | Admitting: Orthopedic Surgery

## 2019-11-24 DIAGNOSIS — M12811 Other specific arthropathies, not elsewhere classified, right shoulder: Secondary | ICD-10-CM

## 2019-11-26 ENCOUNTER — Encounter: Payer: Self-pay | Admitting: Orthopedic Surgery

## 2019-11-26 NOTE — Progress Notes (Signed)
Office Visit Note   Patient: Barbara Patton           Date of Birth: 25-Apr-1941           MRN: [id number] Visit Date: 11/24/2019 Requested by: Seward Carol, MD [address] [place],  [place] [address] PCP: Seward Carol, MD  Subjective: Chief Complaint  Patient presents with  . Right Shoulder - Pain    HPI: Barbara Patton is a patient with multiple orthopedic medical problems.  She had an injection in her right shoulder 6-21 and was doing well until a week ago when she hit her elbow on the table.  She has known rotator cuff arthropathy in that arm.  Reports some continued pain in the biceps region.  Had a radiograph at urgent care and she states "nothing is broken".  She also reports some left posterior buttock pain and leg pain.  MRI scan of the lumbar spine is pending.  She also has an injection in her hip and trochanteric region pending with Dr. Ernestina Patches.              ROS: All systems reviewed are negative as they relate to the chief complaint within the history of present illness.  Patient denies  fevers or chills.   Assessment & Plan: Visit Diagnoses:  1. Rotator cuff arthropathy of right shoulder     Plan: Impression is exacerbation of rotator cuff arthropathy in that right arm.  Cannot really inject that shoulder again.  May be that she is getting ready to ruptured her biceps tendon.  Nonetheless no intervention really is indicated in that right arm for now.  See how she does after her hip injection with Dr. Ernestina Patches as well as after her lumbar spine MRI scan which may also require further injection in her back.  Follow-Up Instructions: Return if symptoms worsen or fail to improve.   Orders:  No orders of the defined types were placed in this encounter.  No orders of the defined types were placed in this encounter.     Procedures: No procedures performed   Clinical Data: No additional findings.  Objective: Vital Signs: There were no vitals taken for this  visit.  Physical Exam:   Constitutional: Patient appears well-developed HEENT:  Head: Normocephalic Eyes:EOM are normal Neck: Normal range of motion Cardiovascular: Normal rate Pulmonary/chest: Effort normal Neurologic: Patient is alert Skin: Skin is warm Psychiatric: Patient has normal mood and affect    Ortho Exam: Ortho exam on the right shoulder demonstrates some loss of forward flexion and abduction which is not a new finding.  She has pretty good deltoid strength.  Motor sensory function of the hand is intact.  No Popeye deformity present in the right arm.  She has fairly reasonable subscap and infraspinatus strength but weakness to supraspinatus testing.  Specialty Comments:  No specialty comments available.  Imaging: No results found.   PMFS History: Patient Active Problem List   Diagnosis Date Noted  . 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 Referred otalgia of left ear 08/23/2015  . 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 Allergic rhinitis 07/26/2015  . Anxiety 07/26/2015  . Benign essential hypertension 07/26/2015  . Dysfunction of left eustachian tube 07/26/2015  . Esophageal reflux 07/26/2015  . Fibromyalgia 07/26/2015  . Pure hypercholesterolemia 07/26/2015  . 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 Pes anserinus bursitis of left knee  12/04/2013  . Trochanteric bursitis of left hip 08/07/2013  . Leg swelling 12/06/2011  . Glaucoma 11/02/2011  . HLD (hyperlipidemia) 11/02/2011  . HTN (hypertension) 11/02/2011  . Hypothyroid 11/02/2011  . OA (osteoarthritis) 11/02/2011  . 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 ([place]) 03/16/2011  . Presence of artificial knee joint 03/16/2011   Past Medical History:  Diagnosis Date  . High cholesterol   . Hypertension   . Thyroid disease     History reviewed. No pertinent family history.  Past Surgical  History:  Procedure Laterality Date  . JOINT REPLACEMENT     Social History   Occupational History  . Not on file  Tobacco Use  . Smoking status: Former Smoker    Quit date: 1997    Years since quitting: 24.4  . Smokeless tobacco: Never Used  Substance and Sexual Activity  . Alcohol use: Yes    Alcohol/week: 1.0 standard drink    Types: 1 Glasses of wine per week    Comment: occ wine  . Drug use: No  . Sexual activity: Not on file

## 2019-11-27 ENCOUNTER — Ambulatory Visit: Payer: Self-pay

## 2019-11-27 ENCOUNTER — Other Ambulatory Visit: Payer: Self-pay

## 2019-11-27 ENCOUNTER — Encounter: Payer: Self-pay | Admitting: Physical Medicine and Rehabilitation

## 2019-11-27 ENCOUNTER — Ambulatory Visit (INDEPENDENT_AMBULATORY_CARE_PROVIDER_SITE_OTHER): Payer: Medicare HMO | Admitting: Physical Medicine and Rehabilitation

## 2019-11-27 DIAGNOSIS — M25552 Pain in left hip: Secondary | ICD-10-CM

## 2019-11-27 MED ORDER — BUPIVACAINE HCL 0.25 % IJ SOLN
4.0000 mL | INTRAMUSCULAR | Status: AC | PRN
  Administered 2019-11-27: 4 mL via INTRA_ARTICULAR

## 2019-11-27 MED ORDER — TRIAMCINOLONE ACETONIDE 40 MG/ML IJ SUSP
60.0000 mg | INTRAMUSCULAR | Status: AC | PRN
  Administered 2019-11-27: 60 mg via INTRA_ARTICULAR

## 2019-11-27 NOTE — Progress Notes (Signed)
Pt states pain in the left hip (som groin pain) mostly left bursa. Pt states last injection 07/18/19 helped out a lot. Pt states medication helps with pain.   .Numeric Pain Rating Scale and Functional Assessment Average Pain 10   In the last MONTH (on 0-10 scale) has pain interfered with the following?  1. General activity like being  able to carry out your everyday physical activities such as walking, climbing stairs, carrying groceries, or moving a chair?  Rating(8)   -Dye Allergies.

## 2019-11-27 NOTE — Progress Notes (Signed)
[name] - 79 y.o. female MRN [id number]  Date of birth: 1940-07-27  Office Visit Note: Visit Date: 11/27/2019 PCP: Seward Carol, MD Referred by: Seward Carol, MD  Subjective: Chief Complaint  Patient presents with  . Left Hip - Pain   HPI:  Barbara Patton is a 79 y.o. female who comes in today at the request of Dr. Anderson Malta for planned repeat Left anesthetic hip arthrogram with fluoroscopic guidance.  The patient has failed conservative care including home exercise, medications, time and activity modification.  This injection will be diagnostic and hopefully therapeutic.  Please see requesting physician notes for further details and justification.  Prior injection greater than 50% relief 07/18/19. MRI lumbar spine pending in July.  ROS Otherwise per HPI.  Assessment & Plan: Visit Diagnoses:  1. Pain in left hip     Plan: Findings:  Patient did have relief of lateral thigh pain after intra-articular injection.    Meds & Orders: No orders of the defined types were placed in this encounter.   Orders Placed This Encounter  Procedures  . Large Joint Inj: L hip joint  . XR C-ARM NO REPORT    Follow-up: Return if symptoms worsen or fail to improve.   Procedures: Large Joint Inj: L hip joint on 11/27/2019 9:27 AM Indications: diagnostic evaluation and pain Details: 22 G 3.5 in needle, fluoroscopy-guided anterior approach  Arthrogram: No  Medications: 4 mL bupivacaine 0.25 %; 60 mg triamcinolone acetonide 40 MG/ML Outcome: tolerated well, no immediate complications  There was excellent flow of contrast producing a partial arthrogram of the hip. The patient did have relief of symptoms during the anesthetic phase of the injection. Procedure, treatment alternatives, risks and benefits explained, specific risks discussed. Consent was given by the patient. Immediately prior to procedure a time out was called to verify the correct patient, procedure, equipment,  support staff and site/side marked as required. Patient was prepped and draped in the usual sterile fashion.      No notes on file   Clinical History: MR OF THE PELVIS WITHOUT CONTRAST  TECHNIQUE: Multiplanar, multisequence MR imaging was performed. No intravenous contrast was administered.  COMPARISON:  Lumbar MRI dated 10/04/2009  FINDINGS: Bones: Low-level marrow edema in the pubic bodies.  Mild spurring of both femoral heads. Loss of disc height at L4-5 and especially L5-S1.  Joint or bursal effusion  Joint effusion:  Trace left hip joint effusion.  Bursae: No regional bursitis  Muscles and tendons  Muscles and tendons:  Unremarkable  Other findings  Miscellaneous: Just above the right hip adductor musculature and below the right acetabulum there is a 2.1 by 1.5 by 1.9 cm (volume = 3.1 cm^3) fluid collection which could be a ganglion cyst or paralabral cyst.  There is sigmoid colon diverticulosis. The uterus is absent. No lower pelvic adenopathy.  IMPRESSION: 1. A 3.1 cm fluid collection just below the right acetabulum could be a paralabral cyst or ganglion cyst. This resides just above the hip adductor musculature. I do not see that this is causing immediate obturator impingement. 2. Trace left hip joint effusion. 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 3. 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 4. Loss of disc height at L4-5 and L5-S1. 5. Sigmoid colon diverticulosis.   Electronically Signed   By: Van Clines M.D.   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 Lateral recess narrowing right more than left at L4-5. Far right lateral protrusion at L5-S1. No significant central canal stenosis.     Objective:  VS:  HT:    WT:   BMI:     BP:   HR: bpm  TEMP: ( )  RESP:    Physical Exam   Imaging: No results found.

## 2019-11-28 ENCOUNTER — Telehealth: Payer: Self-pay | Admitting: Physical Medicine and Rehabilitation

## 2019-11-28 DIAGNOSIS — E7800 Pure hypercholesterolemia, unspecified: Secondary | ICD-10-CM | POA: Diagnosis not present

## 2019-11-28 DIAGNOSIS — E039 Hypothyroidism, unspecified: Secondary | ICD-10-CM | POA: Diagnosis not present

## 2019-11-28 DIAGNOSIS — E785 Hyperlipidemia, unspecified: Secondary | ICD-10-CM | POA: Diagnosis not present

## 2019-11-28 DIAGNOSIS — I10 Essential (primary) hypertension: Secondary | ICD-10-CM | POA: Diagnosis not present

## 2019-11-28 NOTE — Telephone Encounter (Signed)
FYI

## 2019-11-28 NOTE — Telephone Encounter (Signed)
FYI - Patient wanted to let Dr. Ernestina Patches know that the injection she received yesterday worked very well and is not hurting at all this morning.  She also said "Thank you very much!"  Thank you.

## 2019-12-04 ENCOUNTER — Ambulatory Visit
Admission: RE | Admit: 2019-12-04 | Discharge: 2019-12-04 | Disposition: A | Discharge status: Home / Self Care | Payer: Medicare HMO | Source: Ambulatory Visit | Attending: Orthopedic Surgery | Admitting: Orthopedic Surgery

## 2019-12-04 ENCOUNTER — Other Ambulatory Visit: Payer: Self-pay

## 2019-12-04 DIAGNOSIS — M79605 Pain in left leg: Secondary | ICD-10-CM

## 2019-12-04 DIAGNOSIS — M48061 Spinal stenosis, lumbar region without neurogenic claudication: Secondary | ICD-10-CM | POA: Diagnosis not present

## 2019-12-10 ENCOUNTER — Ambulatory Visit (INDEPENDENT_AMBULATORY_CARE_PROVIDER_SITE_OTHER): Payer: Medicare HMO | Admitting: Orthopedic Surgery

## 2019-12-10 DIAGNOSIS — M545 Low back pain, unspecified: Secondary | ICD-10-CM

## 2019-12-13 ENCOUNTER — Encounter: Payer: Self-pay | Admitting: Orthopedic Surgery

## 2019-12-13 NOTE — Progress Notes (Signed)
Office Visit Note   Patient: Barbara Patton           Date of Birth: 1940/10/07           MRN: [id number] Visit Date: 12/10/2019 Requested by: Seward Carol, MD [address] [place],  [place] [address] PCP: Seward Carol, MD  Subjective: Chief Complaint  Patient presents with  . Follow-up    HPI: Barbara Patton is a patient with low back pain and right shoulder pain and a multitude of orthopedic complaints.  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  She also has bladder abnormalities but she has seen a urologist for this.  Left hip injection has helped.  She reports continued low back pain and also some right arm pain.  She was doing very well from subacromial injection and then she had a fall and the arm pain has recurred.  A little too soon for another injection in that right shoulder region.              ROS: All systems reviewed are negative as they relate to the chief complaint within the history of present illness.  Patient denies  fevers or chills.   Assessment & Plan: Visit Diagnoses:  1. Low back pain, unspecified back pain laterality, unspecified chronicity, unspecified whether sciatica present     Plan: Impression is low back pain with worsening moderate stenosis.  Symptoms of decreased walking endurance and activity related radiculopathy are consistent with this finding.  Plan is to refer her to Dr. Ernestina Patches for lumbar spine epidural steroid injections.  She wants to avoid surgical intervention.  Come back in mid August for right shoulder injection to try to reset the pain in that shoulder that does have some early rotator cuff arthropathy.  Follow-Up Instructions: No follow-ups on file.   Orders:  Orders Placed This Encounter  Procedures  . Ambulatory referral to Physical Medicine Rehab   No orders of the defined types were placed in this encounter.     Procedures: No procedures performed   Clinical  Data: No additional findings.  Objective: Vital Signs: There were no vitals taken for this visit.  Physical Exam:   Constitutional: Patient appears well-developed HEENT:  Head: Normocephalic Eyes:EOM are normal Neck: Normal range of motion Cardiovascular: Normal rate Pulmonary/chest: Effort normal Neurologic: Patient is alert Skin: Skin is warm Psychiatric: Patient has normal mood and affect    Ortho Exam: Ortho exam demonstrates full active and passive range of motion of the cervical spine.  Patient does have some diminished strength and crepitus in that right shoulder region to rotator cuff strength testing.  Passive range of motion is maintained with external rotation of 15 degrees of abduction about 60 on both sides.  No nerve root tension signs today.  No muscle atrophy in the legs.  Patient has 5 out of 5 ankle dorsiflexion plantarflexion quad and hamstring strength.  Specialty Comments:  No specialty comments available.  Imaging: No results found.   PMFS History: Patient Active Problem List   Diagnosis Date Noted  . 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 Referred otalgia of left ear 08/23/2015  . 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 Allergic rhinitis 07/26/2015  . Anxiety 07/26/2015  . Benign essential hypertension 07/26/2015  . Dysfunction of left eustachian tube 07/26/2015  . Esophageal reflux 07/26/2015  . Fibromyalgia 07/26/2015  . Pure hypercholesterolemia 07/26/2015  . Status post revision of total replacement of  left knee 08/05/2014  . 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 Pes anserinus bursitis of left knee 12/04/2013  . Trochanteric bursitis of left hip 08/07/2013  . Leg swelling 12/06/2011  . Glaucoma 11/02/2011  . HLD (hyperlipidemia) 11/02/2011  . HTN (hypertension) 11/02/2011  . Hypothyroid 11/02/2011  . OA (osteoarthritis) 11/02/2011  . 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 ([place]) 03/16/2011  .  Presence of artificial knee joint 03/16/2011   Past Medical History:  Diagnosis Date  . High cholesterol   . Hypertension   . Thyroid disease     History reviewed. No pertinent family history.  Past Surgical History:  Procedure Laterality Date  . JOINT REPLACEMENT     Social History   Occupational History  . Not on file  Tobacco Use  . Smoking status: Former Smoker    Quit date: 1997    Years since quitting: 24.5  . Smokeless tobacco: Never Used  Substance and Sexual Activity  . Alcohol use: Yes    Alcohol/week: 1.0 standard drink    Types: 1 Glasses of wine per week    Comment: occ wine  . Drug use: No  . Sexual activity: Not on file

## 2019-12-17 ENCOUNTER — Telehealth: Payer: Self-pay | Admitting: Physical Medicine and Rehabilitation

## 2019-12-17 NOTE — Telephone Encounter (Signed)
Pt called stating she would like to set an appt.  [phone]

## 2019-12-18 ENCOUNTER — Telehealth: Payer: Self-pay | Admitting: Physical Medicine and Rehabilitation

## 2019-12-18 NOTE — Telephone Encounter (Signed)
Scheduled

## 2019-12-18 NOTE — Telephone Encounter (Signed)
See referral

## 2019-12-18 NOTE — Telephone Encounter (Signed)
Patient called requesting a call back to set appt for back injections. Please call patient at [phone].

## 2019-12-31 ENCOUNTER — Other Ambulatory Visit: Payer: Self-pay

## 2019-12-31 ENCOUNTER — Emergency Department (HOSPITAL_COMMUNITY)
Admission: EM | Admit: 2019-12-31 | Discharge: 2019-12-31 | Disposition: A | Discharge status: Home / Self Care | Payer: Medicare HMO | Attending: Emergency Medicine | Admitting: Emergency Medicine

## 2019-12-31 ENCOUNTER — Encounter (HOSPITAL_COMMUNITY): Payer: Self-pay

## 2019-12-31 DIAGNOSIS — E785 Hyperlipidemia, unspecified: Secondary | ICD-10-CM | POA: Diagnosis not present

## 2019-12-31 DIAGNOSIS — N3281 Overactive bladder: Secondary | ICD-10-CM | POA: Diagnosis not present

## 2019-12-31 DIAGNOSIS — I10 Essential (primary) hypertension: Secondary | ICD-10-CM | POA: Insufficient documentation

## 2019-12-31 DIAGNOSIS — E079 Disorder of thyroid, unspecified: Secondary | ICD-10-CM | POA: Diagnosis not present

## 2019-12-31 DIAGNOSIS — F17200 Nicotine dependence, unspecified, uncomplicated: Secondary | ICD-10-CM | POA: Insufficient documentation

## 2019-12-31 DIAGNOSIS — N3941 Urge incontinence: Secondary | ICD-10-CM | POA: Diagnosis not present

## 2019-12-31 DIAGNOSIS — R338 Other retention of urine: Secondary | ICD-10-CM | POA: Diagnosis not present

## 2019-12-31 DIAGNOSIS — N320 Bladder-neck obstruction: Secondary | ICD-10-CM | POA: Insufficient documentation

## 2019-12-31 DIAGNOSIS — Z7901 Long term (current) use of anticoagulants: Secondary | ICD-10-CM | POA: Diagnosis not present

## 2019-12-31 DIAGNOSIS — R310 Gross hematuria: Secondary | ICD-10-CM | POA: Diagnosis not present

## 2019-12-31 DIAGNOSIS — R300 Dysuria: Secondary | ICD-10-CM | POA: Insufficient documentation

## 2019-12-31 DIAGNOSIS — N139 Obstructive and reflux uropathy, unspecified: Secondary | ICD-10-CM | POA: Diagnosis not present

## 2019-12-31 LAB — URINALYSIS, ROUTINE W REFLEX MICROSCOPIC: RBC / HPF: >50 RBC/hpf — ABNORMAL HIGH (ref 0–5)

## 2019-12-31 MED ORDER — PHENAZOPYRIDINE HCL 200 MG PO TABS
200.0000 mg | ORAL_TABLET | Freq: Once | ORAL | Status: AC
  Administered 2019-12-31: 200 mg via ORAL
  Filled 2019-12-31: qty 1

## 2019-12-31 MED ORDER — PHENAZOPYRIDINE HCL 200 MG PO TABS
200.0000 mg | ORAL_TABLET | Freq: Three times a day (TID) | ORAL | 0 refills | Status: DC
Start: 2019-12-31 — End: 2021-04-05

## 2019-12-31 MED ORDER — PHENAZOPYRIDINE HCL 200 MG PO TABS: 200.0000 mg | ORAL_TABLET | Freq: Three times a day (TID) | ORAL | Status: DC

## 2019-12-31 NOTE — ED Triage Notes (Signed)
Pt states she was seen at the urologist today and received a catheter because she had a full bladder, she was sent home with a leg bag, this afternoon she noticed the urine was dark and when she emptied it then she saw bright red blood

## 2019-12-31 NOTE — ED Notes (Signed)
Pt. Discharged to home with foley cath to leg bag.

## 2019-12-31 NOTE — ED Provider Notes (Signed)
[facility] DEPT Provider Note   CSN: [id number] Arrival date & time: 12/31/19  1950     History No chief complaint on file.   Barbara Patton is a 79 y.o. female.  Pt presents to the ED today with blood in her urinary catheter.  Pt went to the urologist b/c she had a MRI on her back on 7/1 which showed a distended bladder with chronic bladder outlet obstruction. Pt had a catheter placed today at the urologist office.  She noticed some blood today when she tried to drain the catheter.  Pt said the catheter is very uncomfortable.        Past Medical History:  Diagnosis Date  . High cholesterol   . Hypertension   . Thyroid disease     Patient Active Problem List   Diagnosis Date Noted  . 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 Referred otalgia of left ear 08/23/2015  . 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 Allergic rhinitis 07/26/2015  . Anxiety 07/26/2015  . Benign essential hypertension 07/26/2015  . Dysfunction of left eustachian tube 07/26/2015  . Esophageal reflux 07/26/2015  . Fibromyalgia 07/26/2015  . Pure hypercholesterolemia 07/26/2015  . 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 Pes anserinus bursitis of left knee 12/04/2013  . Trochanteric bursitis of left hip 08/07/2013  . Leg swelling 12/06/2011  . Glaucoma 11/02/2011  . HLD (hyperlipidemia) 11/02/2011  . HTN (hypertension) 11/02/2011  . Hypothyroid 11/02/2011  . OA (osteoarthritis) 11/02/2011  . 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 ([place]) 03/16/2011  . Presence of artificial knee joint 03/16/2011    Past Surgical History:  Procedure Laterality Date  . JOINT REPLACEMENT       OB History   No obstetric history on file.     History reviewed. No pertinent family history.  Social History   Tobacco Use  . Smoking status: Former Smoker    Quit date: 1997     Years since quitting: 24.5  . Smokeless tobacco: Never Used  Substance Use Topics  . Alcohol use: Yes    Alcohol/week: 1.0 standard drink    Types: 1 Glasses of wine per week    Comment: occ wine  . Drug use: No    Home Medications Prior to Admission medications   Medication Sig Start Date End Date Taking? Authorizing Provider  acetaminophen (TYLENOL) 325 MG tablet Take 650 mg by mouth 2 (two) times daily as needed (pain).   Yes [provider]  betamethasone dipropionate (DIPROLENE) 0.05 % ointment Apply 1 application topically 2 (two) times daily as needed (dry skin irritation).  08/21/19  Yes [provider]  Cholecalciferol (VITAMIN D-3 PO) Take 1 tablet by mouth daily.   Yes [provider]  Coenzyme Q10 (CO Q 10 PO) Take 1 tablet by mouth daily.   Yes [provider]  hydrochlorothiazide (HYDRODIURIL) 25 MG tablet Take 25 mg by mouth daily. 01/06/14  Yes [provider]  hydroxychloroquine (PLAQUENIL) 200 MG tablet Take 200 mg by mouth daily.  06/19/16  Yes [provider]  hydrOXYzine (ATARAX/VISTARIL) 10 MG tablet Take 10 mg by mouth every 6 (six) hours as needed for itching or anxiety.  06/10/17  Yes [provider]  levothyroxine (SYNTHROID) 100 MCG tablet Take 100 mcg by mouth daily. 11/11/19  Yes [provider]  Multiple Vitamin (MULTI-VITAMINS) TABS Take 1 tablet by  mouth daily.    Yes [provider]  Omega-3 Fatty Acids (FISH OIL PO) Take 1 capsule by mouth once a week.    Yes [provider]  omeprazole (PRILOSEC) 40 MG capsule Take 40 mg by mouth daily.  03/09/16  Yes [provider]  simvastatin (ZOCOR) 20 MG tablet Take 20 mg by mouth every evening.  11/06/13  Yes [provider]  timolol (TIMOPTIC) 0.5 % ophthalmic solution Place 1 drop into both eyes 2 (two) times daily.  08/21/19  Yes [provider]  traMADol (ULTRAM) 50 MG tablet Take 50 mg by mouth 2 (two)  times daily as needed (pain).  01/01/14  Yes [provider]  Ascorbic Acid (VITAMIN C PO) Take 1 tablet by mouth daily. Patient not taking: Reported on 12/31/2019    [provider]  diclofenac Sodium (VOLTAREN) 1 % GEL Add'l Sig Add'l Sig topical Add'l Sig Patient not taking: Reported on 12/31/2019 08/21/19   [provider]  furosemide (LASIX) 40 MG tablet Take 40 mg by mouth daily. Patient not taking: Reported on 12/31/2019 12/22/19   [provider]  ibuprofen (ADVIL,MOTRIN) 600 MG tablet TAKE 1 TABLET BY MOUTH THREE TIMES DAILY WITH MEALS FOR 14 DAYS Patient not taking: Reported on 12/31/2019 02/14/18   [provider]  levothyroxine (SYNTHROID, LEVOTHROID) 112 MCG tablet Take 112 mcg by mouth daily. Patient not taking: Reported on 12/31/2019 01/24/14   [provider]  methylPREDNISolone (MEDROL) 4 MG tablet 6 Day taper, take as directed Patient not taking: Reported on 12/31/2019 04/05/18   Lanae Crumbly, PA-C  Multiple Vitamins-Minerals (MULTIVITAMIN PO) Take 1 tablet by mouth daily. Patient not taking: Reported on 12/31/2019    [provider]  oxybutynin (DITROPAN) 5 MG tablet Take 5 mg by mouth 3 (three) times daily. Patient not taking: Reported on 12/31/2019 12/07/19   [provider]  phenazopyridine (PYRIDIUM) 200 MG tablet Take 1 tablet (200 mg total) by mouth 3 (three) times daily. 12/31/19   Isla Pence, MD  tolterodine (DETROL LA) 4 MG 24 hr capsule TAKE 1 CAPSULE BY MOUTH ONCE DAILY FOR 30 DAYS Patient not taking: Reported on 12/31/2019 01/28/18   [provider]  estradiol (ESTRACE) 0.5 MG tablet  02/14/18 11/09/19  [provider]    Allergies    Codeine and Penicillins  Review of Systems   Review of Systems  Genitourinary: Positive for dysuria and hematuria.  All other systems reviewed and are negative.   Physical Exam Updated Vital Signs BP (!) 185/83 (BP Location: Left Arm)   Pulse 95    Temp 98.4 F (36.9 C) (Oral)   Resp 16   Ht 5\' 7"  (1.702 m)   Wt 86.2 kg   SpO2 97%   BMI 29.76 kg/m   Physical Exam Vitals and nursing note reviewed.  Constitutional:      Appearance: Normal appearance.  HENT:     Head: Normocephalic and atraumatic.     Right Ear: External ear normal.     Left Ear: External ear normal.     Nose: Nose normal.     Mouth/Throat:     Mouth: Mucous membranes are moist.     Pharynx: Oropharynx is clear.  Eyes:     Extraocular Movements: Extraocular movements intact.     Conjunctiva/sclera: Conjunctivae normal.     Pupils: Pupils are equal, round, and reactive to light.  Cardiovascular:     Rate and Rhythm: Normal rate and regular rhythm.  Pulses: Normal pulses.     Heart sounds: Normal heart sounds.  Pulmonary:     Effort: Pulmonary effort is normal.     Breath sounds: Normal breath sounds.  Abdominal:     General: Abdomen is flat. Bowel sounds are normal.     Palpations: Abdomen is soft.  Musculoskeletal:        General: Normal range of motion.     Cervical back: Normal range of motion and neck supple.  Skin:    General: Skin is warm.     Capillary Refill: Capillary refill takes less than 2 seconds.  Neurological:     General: No focal deficit present.     Mental Status: She is alert and oriented to person, place, and time.  Psychiatric:        Mood and Affect: Mood normal.        Behavior: Behavior normal.     ED Results / Procedures / Treatments   Labs (all labs ordered are listed, but only abnormal results are displayed) Labs Reviewed  URINALYSIS, ROUTINE W REFLEX MICROSCOPIC - Abnormal; Notable for the following components:      Result Value   Color, Urine RED (*)    APPearance CLOUDY (*)    Glucose, UA   (*)    Value: TEST NOT REPORTED DUE TO COLOR INTERFERENCE OF URINE PIGMENT   Hgb urine dipstick   (*)    Value: TEST NOT REPORTED DUE TO COLOR INTERFERENCE OF URINE PIGMENT   Bilirubin Urine   (*)    Value: TEST  NOT REPORTED DUE TO COLOR INTERFERENCE OF URINE PIGMENT   Ketones, ur   (*)    Value: TEST NOT REPORTED DUE TO COLOR INTERFERENCE OF URINE PIGMENT   Protein, ur   (*)    Value: TEST NOT REPORTED DUE TO COLOR INTERFERENCE OF URINE PIGMENT   Nitrite   (*)    Value: TEST NOT REPORTED DUE TO COLOR INTERFERENCE OF URINE PIGMENT   Leukocytes,Ua   (*)    Value: TEST NOT REPORTED DUE TO COLOR INTERFERENCE OF URINE PIGMENT   RBC / HPF >50 (*)    Bacteria, UA RARE (*)    All other components within normal limits  URINE CULTURE    EKG None  Radiology No results found.  Procedures Procedures (including critical care time)  Medications Ordered in ED Medications  phenazopyridine (PYRIDIUM) tablet 200 mg (200 mg Oral Given 12/31/19 2056)    ED Course  I have reviewed the triage vital signs and the nursing notes.  Pertinent labs & imaging results that were available during my care of the patient were reviewed by me and considered in my medical decision making (see chart for details).    MDM Rules/Calculators/A&P                          Catheter is draining well.  UA showed hematuria and urine sent for a culture.  Pt given pyridium to help with discomfort.  Pt instructed to return if worse.  F/u with urology.   Final Clinical Impression(s) / ED Diagnoses Final diagnoses:  Gross hematuria  Bladder outlet obstruction    Rx / DC Orders ED Discharge Orders         Ordered    phenazopyridine (PYRIDIUM) 200 MG tablet  3 times daily     Discontinue  Reprint     12/31/19 2146  Isla Pence, MD 12/31/19 2147

## 2020-01-01 DIAGNOSIS — R310 Gross hematuria: Secondary | ICD-10-CM | POA: Diagnosis not present

## 2020-01-01 DIAGNOSIS — R338 Other retention of urine: Secondary | ICD-10-CM | POA: Diagnosis not present

## 2020-01-01 DIAGNOSIS — N3941 Urge incontinence: Secondary | ICD-10-CM | POA: Diagnosis not present

## 2020-01-01 DIAGNOSIS — N139 Obstructive and reflux uropathy, unspecified: Secondary | ICD-10-CM | POA: Diagnosis not present

## 2020-01-01 LAB — URINE CULTURE: Culture: NO GROWTH

## 2020-01-02 DIAGNOSIS — R338 Other retention of urine: Secondary | ICD-10-CM | POA: Diagnosis not present

## 2020-01-02 DIAGNOSIS — N139 Obstructive and reflux uropathy, unspecified: Secondary | ICD-10-CM | POA: Diagnosis not present

## 2020-01-03 DIAGNOSIS — R338 Other retention of urine: Secondary | ICD-10-CM | POA: Diagnosis not present

## 2020-01-08 DIAGNOSIS — R338 Other retention of urine: Secondary | ICD-10-CM | POA: Diagnosis not present

## 2020-01-08 DIAGNOSIS — N139 Obstructive and reflux uropathy, unspecified: Secondary | ICD-10-CM | POA: Diagnosis not present

## 2020-01-12 ENCOUNTER — Ambulatory Visit: Payer: Self-pay

## 2020-01-12 ENCOUNTER — Ambulatory Visit (INDEPENDENT_AMBULATORY_CARE_PROVIDER_SITE_OTHER): Payer: Medicare HMO | Admitting: Physical Medicine and Rehabilitation

## 2020-01-12 ENCOUNTER — Encounter: Payer: Self-pay | Admitting: Physical Medicine and Rehabilitation

## 2020-01-12 ENCOUNTER — Other Ambulatory Visit: Payer: Self-pay

## 2020-01-12 VITALS — BP 153/78 | HR 80

## 2020-01-12 DIAGNOSIS — M48062 Spinal stenosis, lumbar region with neurogenic claudication: Secondary | ICD-10-CM

## 2020-01-12 MED ORDER — METHYLPREDNISOLONE ACETATE 80 MG/ML IJ SUSP
80.0000 mg | Freq: Once | INTRAMUSCULAR | Status: AC
  Administered 2020-01-12: 80 mg

## 2020-01-12 NOTE — Progress Notes (Signed)
Pt states lower back pain. Pt states she has pain in the groin. Pt states walking makes the pain worse. Pt state she try to sit and ease the pain.  Pt has hx of inj on 11/27/19 it was good, it lasted for a couple months.  Numeric Pain Rating Scale and Functional Assessment Average Pain 8   In the last MONTH (on 0-10 scale) has pain interfered with the following?  1. General activity like being  able to carry out your everyday physical activities such as walking, climbing stairs, carrying groceries, or moving a chair?  Rating(9)   +Driver, -BT, -Dye Allergies.

## 2020-01-13 DIAGNOSIS — M62838 Other muscle spasm: Secondary | ICD-10-CM | POA: Diagnosis not present

## 2020-01-13 DIAGNOSIS — M6289 Other specified disorders of muscle: Secondary | ICD-10-CM | POA: Diagnosis not present

## 2020-01-13 DIAGNOSIS — N139 Obstructive and reflux uropathy, unspecified: Secondary | ICD-10-CM | POA: Diagnosis not present

## 2020-01-13 DIAGNOSIS — R338 Other retention of urine: Secondary | ICD-10-CM | POA: Diagnosis not present

## 2020-01-13 DIAGNOSIS — N3941 Urge incontinence: Secondary | ICD-10-CM | POA: Diagnosis not present

## 2020-01-16 DIAGNOSIS — M62838 Other muscle spasm: Secondary | ICD-10-CM | POA: Diagnosis not present

## 2020-01-16 DIAGNOSIS — R338 Other retention of urine: Secondary | ICD-10-CM | POA: Diagnosis not present

## 2020-01-16 DIAGNOSIS — M6281 Muscle weakness (generalized): Secondary | ICD-10-CM | POA: Diagnosis not present

## 2020-01-16 DIAGNOSIS — R102 Pelvic and perineal pain: Secondary | ICD-10-CM | POA: Diagnosis not present

## 2020-01-16 DIAGNOSIS — M6289 Other specified disorders of muscle: Secondary | ICD-10-CM | POA: Diagnosis not present

## 2020-01-21 ENCOUNTER — Ambulatory Visit (INDEPENDENT_AMBULATORY_CARE_PROVIDER_SITE_OTHER): Payer: Medicare HMO | Admitting: Orthopedic Surgery

## 2020-01-21 DIAGNOSIS — M12811 Other specific arthropathies, not elsewhere classified, right shoulder: Secondary | ICD-10-CM | POA: Diagnosis not present

## 2020-01-23 DIAGNOSIS — M6281 Muscle weakness (generalized): Secondary | ICD-10-CM | POA: Diagnosis not present

## 2020-01-23 DIAGNOSIS — R338 Other retention of urine: Secondary | ICD-10-CM | POA: Diagnosis not present

## 2020-01-23 DIAGNOSIS — M62838 Other muscle spasm: Secondary | ICD-10-CM | POA: Diagnosis not present

## 2020-01-23 DIAGNOSIS — M6289 Other specified disorders of muscle: Secondary | ICD-10-CM | POA: Diagnosis not present

## 2020-01-23 DIAGNOSIS — R102 Pelvic and perineal pain: Secondary | ICD-10-CM | POA: Diagnosis not present

## 2020-01-24 ENCOUNTER — Encounter: Payer: Self-pay | Admitting: Orthopedic Surgery

## 2020-01-24 NOTE — Progress Notes (Signed)
Office Visit Note   Patient: Barbara Patton           Date of Birth: 1941-05-30           MRN: [id number] Visit Date: 01/21/2020 Requested by: Seward Carol, MD [address] [place],  [place] [address] PCP: Seward Carol, MD  Subjective: Chief Complaint  Patient presents with  . Right Shoulder - Pain    HPI: Barbara Patton is an 79 year old patient with known right shoulder rotator cuff arthropathy.  Requesting injection of the right shoulder today.  Is been several months since her last injection.  She had an epidural steroid injection in her back last week which gave her marginal relief.  In general Barbara Patton typically presents with a multitude of orthopedic complaints.  She wants to avoid any further surgery at all cost on any part of her body which is understandable.  No red flag symptoms today.              ROS: All systems reviewed are negative as they relate to the chief complaint within the history of present illness.  Patient denies  fevers or chills.   Assessment & Plan: Visit Diagnoses:  1. Rotator cuff arthropathy of right shoulder     Plan: Impression is symptomatic right shoulder rotator cuff arthropathy.  Injection performed today in the subacromial space.  I think she is okay to resume bowling.  She may need to use a lighter ball.  In general I think it is advisable for her to try to stay as active and mobile as possible.  Follow-up as needed  Follow-Up Instructions: Return if symptoms worsen or fail to improve.   Orders:  No orders of the defined types were placed in this encounter.  No orders of the defined types were placed in this encounter.     Procedures: No procedures performed   Clinical Data: No additional findings.  Objective: Vital Signs: There were no vitals taken for this visit.  Physical Exam:   Constitutional: Patient appears well-developed HEENT:  Head: Normocephalic Eyes:EOM are normal Neck: Normal range of  motion Cardiovascular: Normal rate Pulmonary/chest: Effort normal Neurologic: Patient is alert Skin: Skin is warm Psychiatric: Patient has normal mood and affect    Ortho Exam: Ortho exam demonstrates some coarseness and grinding with passive and active range of motion of the right shoulder but deltoid is functional and she has fairly reasonable strength.  Her active range of motion is slightly diminished in terms of forward flexion and abduction above 90 degrees.  Specialty Comments:  No specialty comments available.  Imaging: No results found.   PMFS History: Patient Active Problem List   Diagnosis Date Noted  . 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 Referred otalgia of left ear 08/23/2015  . 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 Allergic rhinitis 07/26/2015  . Anxiety 07/26/2015  . Benign essential hypertension 07/26/2015  . Dysfunction of left eustachian tube 07/26/2015  . Esophageal reflux 07/26/2015  . Fibromyalgia 07/26/2015  . Pure hypercholesterolemia 07/26/2015  . 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 Pes anserinus bursitis of left knee 12/04/2013  . Trochanteric bursitis of left hip 08/07/2013  . Leg swelling 12/06/2011  . Glaucoma 11/02/2011  . HLD (hyperlipidemia) 11/02/2011  . HTN (hypertension) 11/02/2011  . Hypothyroid 11/02/2011  . OA (osteoarthritis) 11/02/2011  . 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 ([place]) 03/16/2011  .  Presence of artificial knee joint 03/16/2011   Past Medical History:  Diagnosis Date  . High cholesterol   . Hypertension   . Thyroid disease     History reviewed. No pertinent family history.  Past Surgical History:  Procedure Laterality Date  . JOINT REPLACEMENT     Social History   Occupational History  . Not on file  Tobacco Use  . Smoking status: Former Smoker    Quit date: 1997    Years since quitting:  24.6  . Smokeless tobacco: Never Used  Substance and Sexual Activity  . Alcohol use: Yes    Alcohol/week: 1.0 standard drink    Types: 1 Glasses of wine per week    Comment: occ wine  . Drug use: No  . Sexual activity: Not on file

## 2020-02-02 DIAGNOSIS — R338 Other retention of urine: Secondary | ICD-10-CM | POA: Diagnosis not present

## 2020-02-05 DIAGNOSIS — H43813 Vitreous degeneration, bilateral: Secondary | ICD-10-CM | POA: Diagnosis not present

## 2020-02-05 DIAGNOSIS — H348122 Central retinal vein occlusion, left eye, stable: Secondary | ICD-10-CM | POA: Diagnosis not present

## 2020-02-05 DIAGNOSIS — H35373 Puckering of macula, bilateral: Secondary | ICD-10-CM | POA: Diagnosis not present

## 2020-02-05 DIAGNOSIS — H43393 Other vitreous opacities, bilateral: Secondary | ICD-10-CM | POA: Diagnosis not present

## 2020-02-10 DIAGNOSIS — R338 Other retention of urine: Secondary | ICD-10-CM | POA: Diagnosis not present

## 2020-02-10 DIAGNOSIS — H401132 Primary open-angle glaucoma, bilateral, moderate stage: Secondary | ICD-10-CM | POA: Diagnosis not present

## 2020-02-10 DIAGNOSIS — R350 Frequency of micturition: Secondary | ICD-10-CM | POA: Diagnosis not present

## 2020-02-13 DIAGNOSIS — Z79899 Other long term (current) drug therapy: Secondary | ICD-10-CM | POA: Diagnosis not present

## 2020-02-13 DIAGNOSIS — H401133 Primary open-angle glaucoma, bilateral, severe stage: Secondary | ICD-10-CM | POA: Diagnosis not present

## 2020-02-13 DIAGNOSIS — H52203 Unspecified astigmatism, bilateral: Secondary | ICD-10-CM | POA: Diagnosis not present

## 2020-02-13 DIAGNOSIS — Z961 Presence of intraocular lens: Secondary | ICD-10-CM | POA: Diagnosis not present

## 2020-02-15 NOTE — Procedures (Signed)
Lumbosacral Transforaminal Epidural Steroid Injection - Sub-Pedicular Approach with Fluoroscopic Guidance  Patient: Barbara Patton      Date of Birth: [birth year] MRN: [id number] PCP: Seward Carol, MD      Visit Date: 01/12/2020   Universal Protocol:    Date/Time: 01/12/2020  Consent Given By: the patient  Position: PRONE  Additional Comments: Vital signs were monitored before and after the procedure. Patient was prepped and draped in the usual sterile fashion. The correct patient, procedure, and site was verified.   Injection Procedure Details:  Procedure Site One Meds Administered:  Meds ordered this encounter  Medications  . methylPREDNISolone acetate (DEPO-MEDROL) injection 80 mg    Laterality: Left  Location/Site:  L3-L4  Needle size: 22 G  Needle type: Spinal  Needle Placement: Transforaminal  Findings:    -Comments: Excellent flow of contrast along the nerve, nerve root and into the epidural space.  Procedure Details: After squaring off the end-plates to get a true AP view, the C-arm was positioned so that an oblique view of the foramen as noted above was visualized. The target area is just inferior to the "nose of the scotty dog" or sub pedicular. The soft tissues overlying this structure were infiltrated with 2-3 ml. of 1% Lidocaine without Epinephrine.  The spinal needle was inserted toward the target using a "trajectory" view along the fluoroscope beam.  Under AP and lateral visualization, the needle was advanced so it did not puncture dura and was located close the 6 O'Clock position of the pedical in AP tracterory. Biplanar projections were used to confirm position. Aspiration was confirmed to be negative for CSF and/or blood. A 1-2 ml. volume of Isovue-250 was injected and flow of contrast was noted at each level. Radiographs were obtained for documentation purposes.   After attaining the desired flow of contrast documented above, a 0.5 to 1.0 ml test  dose of 0.25% Marcaine was injected into each respective transforaminal space.  The patient was observed for 90 seconds post injection.  After no sensory deficits were reported, and normal lower extremity motor function was noted,   the above injectate was administered so that equal amounts of the injectate were placed at each foramen (level) into the transforaminal epidural space.   Additional Comments:  The patient tolerated the procedure well Dressing: 2 x 2 sterile gauze and Band-Aid    Post-procedure details: Patient was observed during the procedure. Post-procedure instructions were reviewed.  Patient left the clinic in stable condition.

## 2020-02-15 NOTE — Progress Notes (Signed)
Barbara Patton - 79 y.o. female MRN [id number]  Date of birth: 08-20-1940  Office Visit Note: Visit Date: 01/12/2020 PCP: Seward Carol, MD Referred by: Seward Carol, MD  Subjective: Chief Complaint  Patient presents with  . Lower Back - Pain   HPI:  Barbara Patton is a 79 y.o. female who comes in today at the request of Dr. Anderson Malta for planned Left L3-L4 Lumbar epidural steroid injection with fluoroscopic guidance.  The patient has failed conservative care including home exercise, medications, time and activity modification.  This injection will be diagnostic and hopefully therapeutic.  Please see requesting physician notes for further details and justification.   MRI reviewed with images and spine model.  MRI reviewed in the note below.  Patient has a history of chronic worsening severe low back pain with hip and groin pain.  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  Diagnostic hip injection performed in June was quite beneficial but short-lived.  She also has a history of lumbar stenosis.  ROS Otherwise per HPI.  Assessment & Plan: Visit Diagnoses:  1. Spinal stenosis of lumbar region with neurogenic claudication     Plan: No additional findings.   Meds & Orders:  Meds ordered this encounter  Medications  . methylPREDNISolone acetate (DEPO-MEDROL) injection 80 mg    Orders Placed This Encounter  Procedures  . XR C-ARM NO REPORT  . Epidural Steroid injection    Follow-up: Return if symptoms worsen or fail to improve.   Procedures: No procedures performed  Lumbosacral Transforaminal Epidural Steroid Injection - Sub-Pedicular Approach with Fluoroscopic Guidance  Patient: Barbara Patton      Date of Birth: 03/16/1941 MRN: [id number] PCP: Seward Carol, MD      Visit Date: 01/12/2020   Universal Protocol:    Date/Time: 01/12/2020  Consent Given By: the patient  Position: PRONE  Additional Comments: Vital signs were monitored  before and after the procedure. Patient was prepped and draped in the usual sterile fashion. The correct patient, procedure, and site was verified.   Injection Procedure Details:  Procedure Site One Meds Administered:  Meds ordered this encounter  Medications  . methylPREDNISolone acetate (DEPO-MEDROL) injection 80 mg    Laterality: Left  Location/Site:  L3-L4  Needle size: 22 G  Needle type: Spinal  Needle Placement: Transforaminal  Findings:    -Comments: Excellent flow of contrast along the nerve, nerve root and into the epidural space.  Procedure Details: After squaring off the end-plates to get a true AP view, the C-arm was positioned so that an oblique view of the foramen as noted above was visualized. The target area is just inferior to the "nose of the scotty dog" or sub pedicular. The soft tissues overlying this structure were infiltrated with 2-3 ml. of 1% Lidocaine without Epinephrine.  The spinal needle was inserted toward the target using a "trajectory" view along the fluoroscope beam.  Under AP and lateral visualization, the needle was advanced so it did not puncture dura and was located close the 6 O'Clock position of the pedical in AP tracterory. Biplanar projections were used to confirm position. Aspiration was confirmed to be negative for CSF and/or blood. A 1-2 ml. volume of Isovue-250 was injected and flow of contrast was noted at each level. Radiographs were obtained for documentation purposes.   After attaining the desired flow of contrast documented above, a 0.5 to 1.0 ml test dose of 0.25% Marcaine was injected into each respective transforaminal  space.  The patient was observed for 90 seconds post injection.  After no sensory deficits were reported, and normal lower extremity motor function was noted,   the above injectate was administered so that equal amounts of the injectate were placed at each foramen (level) into the transforaminal epidural  space.   Additional Comments:  The patient tolerated the procedure well Dressing: 2 x 2 sterile gauze and Band-Aid    Post-procedure details: Patient was observed during the procedure. Post-procedure instructions were reviewed.  Patient left the clinic in stable condition.      Clinical History: CLINICAL DATA:  Chronic low back pain with left leg pain for several years  EXAM: MRI LUMBAR SPINE WITHOUT CONTRAST  TECHNIQUE: Multiplanar, multisequence MR imaging of the lumbar spine was performed. No intravenous contrast was administered.  COMPARISON:  10/04/2009  FINDINGS: Segmentation:  Standard lumbar numbering  Alignment:  Mild anterolisthesis at L3-4 and L4-5.  Vertebrae:  No fracture, evidence of discitis, or bone lesion.  Conus medullaris and cauda equina: Conus extends to the L1-2 level. Conus and cauda equina appear normal.  Paraspinal and other soft tissues: Distended bladder which is trabeculated. Prominent atrophy of intrinsic back muscles. Partially covered sigmoid diverticulosis.  Disc levels:  T12- L1: Minor spondylosis and annulus bulging  L1-L2: Unremarkable for age  L2-L3: Mild facet spurring and annulus bulging.  L3-L4: Prominent facet osteoarthritis with spurring and effusions in the gaping joint spaces. Mild anterolisthesis. Mild disc narrowing and bulging. There is moderate spinal stenosis but no static nerve root compression.  L4-L5: Facet osteoarthritis with spurring and mild anterolisthesis. Mild disc bulging. No neural compression  L5-S1:Mild facet spurring and disc bulging.  IMPRESSION: 1. 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 2. 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 3. 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   Electronically Signed   By: Monte Fantasia M.D.    On: 12/05/2019 05:54.     Objective:  VS:  HT:    WT:   BMI:     BP:(!) 153/78  HR:80bpm  TEMP: ( )  RESP:  Physical Exam Constitutional:      General: She is not in acute distress.    Appearance: Normal appearance. She is not ill-appearing.  HENT:     Head: Normocephalic and atraumatic.     Right Ear: External ear normal.     Left Ear: External ear normal.  Eyes:     Extraocular Movements: Extraocular movements intact.  Cardiovascular:     Rate and Rhythm: Normal rate.     Pulses: Normal pulses.  Musculoskeletal:     Right lower leg: No edema.     Left lower leg: No edema.     Comments: Patient has good distal strength with no pain over the greater trochanters.  No clonus or focal weakness.  Skin:    Findings: No erythema, lesion or rash.  Neurological:     General: No focal deficit present.     Mental Status: She is alert and oriented to person, place, and time.     Sensory: No sensory deficit.     Motor: No weakness or abnormal muscle tone.     Coordination: Coordination normal.  Psychiatric:        Mood and Affect: Mood normal.        Behavior: Behavior normal.      Imaging: No results found.

## 2020-02-19 DIAGNOSIS — M6281 Muscle weakness (generalized): Secondary | ICD-10-CM | POA: Diagnosis not present

## 2020-02-19 DIAGNOSIS — M6289 Other specified disorders of muscle: Secondary | ICD-10-CM | POA: Diagnosis not present

## 2020-02-19 DIAGNOSIS — R102 Pelvic and perineal pain: Secondary | ICD-10-CM | POA: Diagnosis not present

## 2020-02-19 DIAGNOSIS — M62838 Other muscle spasm: Secondary | ICD-10-CM | POA: Diagnosis not present

## 2020-02-24 ENCOUNTER — Ambulatory Visit: Payer: Medicare HMO | Attending: Internal Medicine

## 2020-02-24 DIAGNOSIS — Z23 Encounter for immunization: Secondary | ICD-10-CM

## 2020-02-24 NOTE — Progress Notes (Signed)
   Covid-19 Vaccination Clinic  Name:  Barbara Patton    MRN: [id number] DOB: 1941/01/18  02/24/2020  [name] was observed post Covid-19 immunization for 15 minutes without incident. She was provided with Vaccine Information Sheet and instruction to access the V-Safe system.   [name] was instructed to call 911 with any severe reactions post vaccine: Marland Kitchen Difficulty breathing  . Swelling of face and throat  . A fast heartbeat  . A bad rash all over body  . Dizziness and weakness

## 2020-02-26 DIAGNOSIS — H2011 Chronic iridocyclitis, right eye: Secondary | ICD-10-CM | POA: Diagnosis not present

## 2020-02-26 DIAGNOSIS — R21 Rash and other nonspecific skin eruption: Secondary | ICD-10-CM | POA: Diagnosis not present

## 2020-02-26 DIAGNOSIS — E559 Vitamin D deficiency, unspecified: Secondary | ICD-10-CM | POA: Diagnosis not present

## 2020-02-26 DIAGNOSIS — M5136 Other intervertebral disc degeneration, lumbar region: Secondary | ICD-10-CM | POA: Diagnosis not present

## 2020-02-26 DIAGNOSIS — M329 Systemic lupus erythematosus, unspecified: Secondary | ICD-10-CM | POA: Diagnosis not present

## 2020-02-26 DIAGNOSIS — M150 Primary generalized (osteo)arthritis: Secondary | ICD-10-CM | POA: Diagnosis not present

## 2020-02-26 DIAGNOSIS — Z6831 Body mass index (BMI) 31.0-31.9, adult: Secondary | ICD-10-CM | POA: Diagnosis not present

## 2020-02-26 DIAGNOSIS — R5383 Other fatigue: Secondary | ICD-10-CM | POA: Diagnosis not present

## 2020-02-26 DIAGNOSIS — M797 Fibromyalgia: Secondary | ICD-10-CM | POA: Diagnosis not present

## 2020-02-27 DIAGNOSIS — E785 Hyperlipidemia, unspecified: Secondary | ICD-10-CM | POA: Diagnosis not present

## 2020-02-27 DIAGNOSIS — S93491A Sprain of other ligament of right ankle, initial encounter: Secondary | ICD-10-CM | POA: Diagnosis not present

## 2020-02-27 DIAGNOSIS — E039 Hypothyroidism, unspecified: Secondary | ICD-10-CM | POA: Diagnosis not present

## 2020-02-27 DIAGNOSIS — I10 Essential (primary) hypertension: Secondary | ICD-10-CM | POA: Diagnosis not present

## 2020-02-27 DIAGNOSIS — E7800 Pure hypercholesterolemia, unspecified: Secondary | ICD-10-CM | POA: Diagnosis not present

## 2020-03-10 DIAGNOSIS — M6289 Other specified disorders of muscle: Secondary | ICD-10-CM | POA: Diagnosis not present

## 2020-03-10 DIAGNOSIS — R102 Pelvic and perineal pain: Secondary | ICD-10-CM | POA: Diagnosis not present

## 2020-03-10 DIAGNOSIS — M6281 Muscle weakness (generalized): Secondary | ICD-10-CM | POA: Diagnosis not present

## 2020-03-10 DIAGNOSIS — M62838 Other muscle spasm: Secondary | ICD-10-CM | POA: Diagnosis not present

## 2020-03-19 DIAGNOSIS — Z1231 Encounter for screening mammogram for malignant neoplasm of breast: Secondary | ICD-10-CM | POA: Diagnosis not present

## 2020-03-25 ENCOUNTER — Other Ambulatory Visit (INDEPENDENT_AMBULATORY_CARE_PROVIDER_SITE_OTHER): Payer: Self-pay | Admitting: Surgical

## 2020-03-25 NOTE — Telephone Encounter (Signed)
Please advise 

## 2020-04-20 DIAGNOSIS — M48061 Spinal stenosis, lumbar region without neurogenic claudication: Secondary | ICD-10-CM | POA: Diagnosis not present

## 2020-04-20 DIAGNOSIS — M329 Systemic lupus erythematosus, unspecified: Secondary | ICD-10-CM | POA: Diagnosis not present

## 2020-04-20 DIAGNOSIS — E7800 Pure hypercholesterolemia, unspecified: Secondary | ICD-10-CM | POA: Diagnosis not present

## 2020-04-20 DIAGNOSIS — R339 Retention of urine, unspecified: Secondary | ICD-10-CM | POA: Diagnosis not present

## 2020-04-20 DIAGNOSIS — I10 Essential (primary) hypertension: Secondary | ICD-10-CM | POA: Diagnosis not present

## 2020-04-20 DIAGNOSIS — I5189 Other ill-defined heart diseases: Secondary | ICD-10-CM | POA: Diagnosis not present

## 2020-04-20 DIAGNOSIS — N320 Bladder-neck obstruction: Secondary | ICD-10-CM | POA: Diagnosis not present

## 2020-04-20 DIAGNOSIS — E039 Hypothyroidism, unspecified: Secondary | ICD-10-CM | POA: Diagnosis not present

## 2020-04-20 DIAGNOSIS — E663 Overweight: Secondary | ICD-10-CM | POA: Diagnosis not present

## 2020-04-26 ENCOUNTER — Other Ambulatory Visit: Payer: Self-pay

## 2020-04-26 ENCOUNTER — Ambulatory Visit (INDEPENDENT_AMBULATORY_CARE_PROVIDER_SITE_OTHER): Payer: Medicare HMO | Admitting: Orthopedic Surgery

## 2020-04-26 ENCOUNTER — Ambulatory Visit (INDEPENDENT_AMBULATORY_CARE_PROVIDER_SITE_OTHER): Payer: Medicare HMO

## 2020-04-26 DIAGNOSIS — M25562 Pain in left knee: Secondary | ICD-10-CM

## 2020-04-26 DIAGNOSIS — M12811 Other specific arthropathies, not elsewhere classified, right shoulder: Secondary | ICD-10-CM

## 2020-04-26 NOTE — Progress Notes (Signed)
Office Visit Note   Patient: Barbara Patton           Date of Birth: 1941/05/26           MRN: [id number] Visit Date: 04/26/2020 Requested by: Seward Carol, MD [address] [place],  [place] [address] PCP: Seward Carol, MD  Subjective: Chief Complaint  Patient presents with  . Right Shoulder - Pain    HPI: Barbara Patton is a 79 y.o. female who presents to the office complaining of right shoulder and left knee pain.  Patient states that she fell onto her left knee about 1 month ago.  This was a mechanical ground-level fall and she denies any difficulty weightbearing after the fall.  She does have a history of left total knee revision surgery.  Denies any locking symptoms or significant instability.  Denies any groin pain.  She does receive left trochanteric bursa injections from Dr. Laurence Spates.  She has been using CBD oil with some relief of her shoulder and knee pain.  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  Denies any history of diabetes..                ROS: All systems reviewed are negative as they relate to the chief complaint within the history of present illness.  Patient denies fevers or chills.  Assessment & Plan: Visit Diagnoses:  1. Left knee pain, unspecified chronicity   2. Rotator cuff arthropathy of right shoulder     Plan: Patient is an 79 year old female presents for evaluation of right shoulder and left knee pain.  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  She last received injection in August 2021 and this provided good relief up until her recent fall 1 month ago.  Plan to administer another right subacromial injection today.  She tolerated the procedure well.  Regarding the left knee, radiographs were taken today.  No significant abnormalities noted on the left knee radiographs when compared with previous radiographs from ~9 months ago.  She is able to WB well on the knee.  Plan  to f/u prn.    Follow-Up Instructions: No follow-ups on file.   Orders:  Orders Placed This Encounter  Procedures  . XR Knee 1-2 Views Left   No orders of the defined types were placed in this encounter.     Procedures: Large Joint Inj: R glenohumeral on 05/02/2020 6:39 PM Indications: diagnostic evaluation and pain Details: 18 G 1.5 in needle, posterior approach  Arthrogram: No  Medications: 9 mL bupivacaine 0.5 %; 40 mg methylPREDNISolone acetate 40 MG/ML; 5 mL lidocaine 1 % Outcome: tolerated well, no immediate complications Procedure, treatment alternatives, risks and benefits explained, specific risks discussed. Consent was given by the patient. Immediately prior to procedure a time out was called to verify the correct patient, procedure, equipment, support staff and site/side marked as required. Patient was prepped and draped in the usual sterile fashion.       Clinical Data: No additional findings.  Objective: Vital Signs: There were no vitals taken for this visit.  Physical Exam:  Constitutional: Patient appears well-developed HEENT:  Head: Normocephalic Eyes:EOM are normal Neck: Normal range of motion Cardiovascular: Normal rate Pulmonary/chest: Effort normal Neurologic: Patient is alert Skin: Skin is warm Psychiatric: Patient has normal mood and affect  Ortho Exam: Ortho exam demonstrates right shoulder with crepitus with passive range of motion of the shoulder.  Positive  Neer and Hawkins impingement signs.  Well-healed incision from prior left total knee replacement.  Full extension of the left knee with extensor mechanism intact.  Tender over the medial aspect of the left knee diffusely.  No increased ligamentous laxity compared with contralateral side.  No effusion present.   Specialty Comments:  No specialty comments available.  Imaging: No results found.   PMFS History: Patient Active Problem List   Diagnosis Date Noted  . Bilateral  sensorineural hearing loss 08/23/2015  . Referred otalgia of left ear 08/23/2015  . 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 Allergic rhinitis 07/26/2015  . Anxiety 07/26/2015  . Benign essential hypertension 07/26/2015  . Dysfunction of left eustachian tube 07/26/2015  . Esophageal reflux 07/26/2015  . Fibromyalgia 07/26/2015  . Pure hypercholesterolemia 07/26/2015  . 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 Pes anserinus bursitis of left knee 12/04/2013  . Trochanteric bursitis of left hip 08/07/2013  . Leg swelling 12/06/2011  . Glaucoma 11/02/2011  . HLD (hyperlipidemia) 11/02/2011  . HTN (hypertension) 11/02/2011  . Hypothyroid 11/02/2011  . OA (osteoarthritis) 11/02/2011  . 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 ([place]) 03/16/2011  . Presence of artificial knee joint 03/16/2011   Past Medical History:  Diagnosis Date  . High cholesterol   . Hypertension   . Thyroid disease     No family history on file.  Past Surgical History:  Procedure Laterality Date  . JOINT REPLACEMENT     Social History   Occupational History  . Not on file  Tobacco Use  . Smoking status: Former Smoker    Quit date: 1997    Years since quitting: 24.9  . Smokeless tobacco: Never Used  Substance and Sexual Activity  . Alcohol use: Yes    Alcohol/week: 1.0 standard drink    Types: 1 Glasses of wine per week    Comment: occ wine  . Drug use: No  . Sexual activity: Not on file

## 2020-04-30 DIAGNOSIS — H524 Presbyopia: Secondary | ICD-10-CM | POA: Diagnosis not present

## 2020-04-30 DIAGNOSIS — H52223 Regular astigmatism, bilateral: Secondary | ICD-10-CM | POA: Diagnosis not present

## 2020-05-01 ENCOUNTER — Encounter: Payer: Self-pay | Admitting: Orthopedic Surgery

## 2020-05-02 ENCOUNTER — Encounter: Payer: Self-pay | Admitting: Orthopedic Surgery

## 2020-05-02 DIAGNOSIS — M25562 Pain in left knee: Secondary | ICD-10-CM | POA: Diagnosis not present

## 2020-05-02 DIAGNOSIS — M12811 Other specific arthropathies, not elsewhere classified, right shoulder: Secondary | ICD-10-CM | POA: Diagnosis not present

## 2020-05-02 MED ORDER — METHYLPREDNISOLONE ACETATE 40 MG/ML IJ SUSP
40.0000 mg | INTRAMUSCULAR | Status: AC | PRN
Start: 2020-05-02 — End: 2020-05-02
  Administered 2020-05-02: 40 mg via INTRA_ARTICULAR

## 2020-05-02 MED ORDER — LIDOCAINE HCL 1 % IJ SOLN
5.0000 mL | INTRAMUSCULAR | Status: AC | PRN
Start: 2020-05-02 — End: 2020-05-02
  Administered 2020-05-02: 5 mL

## 2020-05-02 MED ORDER — BUPIVACAINE HCL 0.5 % IJ SOLN
9.0000 mL | INTRAMUSCULAR | Status: AC | PRN
Start: 2020-05-02 — End: 2020-05-02
  Administered 2020-05-02: 9 mL via INTRA_ARTICULAR

## 2020-05-19 ENCOUNTER — Telehealth: Payer: Self-pay

## 2020-05-19 NOTE — Telephone Encounter (Signed)
Called pt and sch 06/15/20.

## 2020-05-19 NOTE — Telephone Encounter (Signed)
Ok if helped but ok [place] if needed faster

## 2020-05-19 NOTE — Telephone Encounter (Signed)
Pt called wanting a repeat of Lt L3-L4 TF. Side pain and no new injury. Pt received last inj on 01/12/20.

## 2020-06-01 DIAGNOSIS — R338 Other retention of urine: Secondary | ICD-10-CM | POA: Diagnosis not present

## 2020-06-02 DIAGNOSIS — K219 Gastro-esophageal reflux disease without esophagitis: Secondary | ICD-10-CM | POA: Diagnosis not present

## 2020-06-02 DIAGNOSIS — I10 Essential (primary) hypertension: Secondary | ICD-10-CM | POA: Diagnosis not present

## 2020-06-02 DIAGNOSIS — E7800 Pure hypercholesterolemia, unspecified: Secondary | ICD-10-CM | POA: Diagnosis not present

## 2020-06-02 DIAGNOSIS — E785 Hyperlipidemia, unspecified: Secondary | ICD-10-CM | POA: Diagnosis not present

## 2020-06-02 DIAGNOSIS — E039 Hypothyroidism, unspecified: Secondary | ICD-10-CM | POA: Diagnosis not present

## 2020-06-15 ENCOUNTER — Encounter: Payer: Self-pay | Admitting: Physical Medicine and Rehabilitation

## 2020-06-15 ENCOUNTER — Ambulatory Visit: Payer: Self-pay

## 2020-06-15 ENCOUNTER — Ambulatory Visit (INDEPENDENT_AMBULATORY_CARE_PROVIDER_SITE_OTHER): Payer: Medicare HMO | Admitting: Physical Medicine and Rehabilitation

## 2020-06-15 ENCOUNTER — Other Ambulatory Visit: Payer: Self-pay

## 2020-06-15 VITALS — BP 149/69 | HR 68

## 2020-06-15 DIAGNOSIS — M48062 Spinal stenosis, lumbar region with neurogenic claudication: Secondary | ICD-10-CM

## 2020-06-15 DIAGNOSIS — M5416 Radiculopathy, lumbar region: Secondary | ICD-10-CM

## 2020-06-15 MED ORDER — DEXAMETHASONE SODIUM PHOSPHATE 10 MG/ML IJ SOLN
15.0000 mg | Freq: Once | INTRAMUSCULAR | Status: DC
Start: 2020-06-15 — End: 2020-09-14

## 2020-06-15 NOTE — Progress Notes (Signed)
Barbara Patton - 80 y.o. female MRN [id number]  Date of birth: [birth year]  Office Visit Note: Visit Date: 06/15/2020 PCP: Seward Carol, MD Referred by: Seward Carol, MD  Subjective: Chief Complaint  Patient presents with  . Lower Back - Pain  . Left Knee - Pain  . Left Leg - Pain  . Left Foot - Pain   HPI:  Barbara Patton is a 80 y.o. female who comes in today for planned repeat Left L3-L4 Lumbar epidural steroid injection with fluoroscopic guidance.  The patient has failed conservative care including home exercise, medications, time and activity modification.  This injection will be diagnostic and hopefully therapeutic.  Please see requesting physician notes for further details and justification. Patient received more than 50% pain relief from prior injection.   Referring: Dr. Landry Dyke Dean   ROS Otherwise per HPI.  Assessment & Plan: Visit Diagnoses:    ICD-10-CM   1. Lumbar radiculopathy  M54.16 XR C-ARM NO REPORT    Epidural Steroid injection    dexamethasone (DECADRON) injection 15 mg  2. Spinal stenosis of lumbar region with neurogenic claudication  M48.062 XR C-ARM NO REPORT    Epidural Steroid injection    dexamethasone (DECADRON) injection 15 mg    Plan: No additional findings.   Meds & Orders:  Meds ordered this encounter  Medications  . dexamethasone (DECADRON) injection 15 mg    Orders Placed This Encounter  Procedures  . XR C-ARM NO REPORT  . Epidural Steroid injection    Follow-up: Return if symptoms worsen or fail to improve, for follow up with Dr. Anderson Malta.   Procedures: No procedures performed  Lumbosacral Transforaminal Epidural Steroid Injection - Sub-Pedicular Approach with Fluoroscopic Guidance  Patient: Barbara Patton      Date of Birth: 03-16-1941 MRN: [id number] PCP: Seward Carol, MD      Visit Date: 06/15/2020   Universal Protocol:    Date/Time: 06/15/2020  Consent Given By: the patient  Position: PRONE  Additional  Comments: Vital signs were monitored before and after the procedure. Patient was prepped and draped in the usual sterile fashion. The correct patient, procedure, and site was verified.   Injection Procedure Details:   Procedure diagnoses: Lumbar radiculopathy [M54.16]    Meds Administered:  Meds ordered this encounter  Medications  . dexamethasone (DECADRON) injection 15 mg    Laterality: Left  Location/Site:  L3-L4  Needle:5.0 in., 22 ga.  Short bevel or Quincke spinal needle  Needle Placement: Transforaminal  Findings:    -Comments: Excellent flow of contrast along the nerve, nerve root and into the epidural space.  Procedure Details: After squaring off the end-plates to get a true AP view, the C-arm was positioned so that an oblique view of the foramen as noted above was visualized. The target area is just inferior to the "nose of the scotty dog" or sub pedicular. The soft tissues overlying this structure were infiltrated with 2-3 ml. of 1% Lidocaine without Epinephrine.  The spinal needle was inserted toward the target using a "trajectory" view along the fluoroscope beam.  Under AP and lateral visualization, the needle was advanced so it did not puncture dura and was located close the 6 O'Clock position of the pedical in AP tracterory. Biplanar projections were used to confirm position. Aspiration was confirmed to be negative for CSF and/or blood. A 1-2 ml. volume of Isovue-250 was injected and flow of contrast was noted at each level. Radiographs were obtained for documentation  purposes.   After attaining the desired flow of contrast documented above, a 0.5 to 1.0 ml test dose of 0.25% Marcaine was injected into each respective transforaminal space.  The patient was observed for 90 seconds post injection.  After no sensory deficits were reported, and normal lower extremity motor function was noted,   the above injectate was administered so that equal amounts of the injectate  were placed at each foramen (level) into the transforaminal epidural space.   Additional Comments:  The patient tolerated the procedure well Dressing: 2 x 2 sterile gauze and Band-Aid    Post-procedure details: Patient was observed during the procedure. Post-procedure instructions were reviewed.  Patient left the clinic in stable condition.      Clinical History: CLINICAL DATA:  Chronic low back pain with left leg pain for several years  EXAM: MRI LUMBAR SPINE WITHOUT CONTRAST  TECHNIQUE: Multiplanar, multisequence MR imaging of the lumbar spine was performed. No intravenous contrast was administered.  COMPARISON:  10/04/2009  FINDINGS: Segmentation:  Standard lumbar numbering  Alignment:  Mild anterolisthesis at L3-4 and L4-5.  Vertebrae:  No fracture, evidence of discitis, or bone lesion.  Conus medullaris and cauda equina: Conus extends to the L1-2 level. Conus and cauda equina appear normal.  Paraspinal and other soft tissues: Distended bladder which is trabeculated. Prominent atrophy of intrinsic back muscles. Partially covered sigmoid diverticulosis.  Disc levels:  T12- L1: Minor spondylosis and annulus bulging  L1-L2: Unremarkable for age  L2-L3: Mild facet spurring and annulus bulging.  L3-L4: Prominent facet osteoarthritis with spurring and effusions in the gaping joint spaces. Mild anterolisthesis. Mild disc narrowing and bulging. There is moderate spinal stenosis but no static nerve root compression.  L4-L5: Facet osteoarthritis with spurring and mild anterolisthesis. Mild disc bulging. No neural compression  L5-S1:Mild facet spurring and disc bulging.  IMPRESSION: 1. 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 2. 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 3. 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   Electronically Signed   By: Monte Fantasia M.D.   On: 12/05/2019 05:54.     Objective:  VS:  HT:    WT:   BMI:     BP:(!) 149/69  HR:68bpm  TEMP: ( )  RESP:  Physical Exam Vitals and nursing note reviewed.  Constitutional:      General: She is not in acute distress.    Appearance: Normal appearance. She is not ill-appearing.  HENT:     Head: Normocephalic and atraumatic.     Right Ear: External ear normal.     Left Ear: External ear normal.  Eyes:     Extraocular Movements: Extraocular movements intact.  Cardiovascular:     Rate and Rhythm: Normal rate.     Pulses: Normal pulses.  Pulmonary:     Effort: Pulmonary effort is normal. No respiratory distress.  Abdominal:     General: There is no distension.     Palpations: Abdomen is soft.  Musculoskeletal:        General: Tenderness present.     Cervical back: Neck supple.     Right lower leg: No edema.     Left lower leg: No edema.     Comments: Patient has good distal strength with no pain over the greater trochanters.  No clonus or focal weakness.  Skin:    Findings: No erythema, lesion or rash.  Neurological:  General: No focal deficit present.     Mental Status: She is alert and oriented to person, place, and time.     Sensory: No sensory deficit.     Motor: No weakness or abnormal muscle tone.     Coordination: Coordination normal.  Psychiatric:        Mood and Affect: Mood normal.        Behavior: Behavior normal.      Imaging: No results found.

## 2020-06-15 NOTE — Patient Instructions (Signed)

## 2020-06-15 NOTE — Procedures (Signed)
Lumbosacral Transforaminal Epidural Steroid Injection - Sub-Pedicular Approach with Fluoroscopic Guidance  Patient: Barbara Patton      Date of Birth: 02-15-1941 MRN: [id number] PCP: Seward Carol, MD      Visit Date: 06/15/2020   Universal Protocol:    Date/Time: 06/15/2020  Consent Given By: the patient  Position: PRONE  Additional Comments: Vital signs were monitored before and after the procedure. Patient was prepped and draped in the usual sterile fashion. The correct patient, procedure, and site was verified.   Injection Procedure Details:   Procedure diagnoses: Lumbar radiculopathy [M54.16]    Meds Administered:  Meds ordered this encounter  Medications  . dexamethasone (DECADRON) injection 15 mg    Laterality: Left  Location/Site:  L3-L4  Needle:5.0 in., 22 ga.  Short bevel or Quincke spinal needle  Needle Placement: Transforaminal  Findings:    -Comments: Excellent flow of contrast along the nerve, nerve root and into the epidural space.  Procedure Details: After squaring off the end-plates to get a true AP view, the C-arm was positioned so that an oblique view of the foramen as noted above was visualized. The target area is just inferior to the "nose of the scotty dog" or sub pedicular. The soft tissues overlying this structure were infiltrated with 2-3 ml. of 1% Lidocaine without Epinephrine.  The spinal needle was inserted toward the target using a "trajectory" view along the fluoroscope beam.  Under AP and lateral visualization, the needle was advanced so it did not puncture dura and was located close the 6 O'Clock position of the pedical in AP tracterory. Biplanar projections were used to confirm position. Aspiration was confirmed to be negative for CSF and/or blood. A 1-2 ml. volume of Isovue-250 was injected and flow of contrast was noted at each level. Radiographs were obtained for documentation purposes.   After attaining the desired flow of contrast  documented above, a 0.5 to 1.0 ml test dose of 0.25% Marcaine was injected into each respective transforaminal space.  The patient was observed for 90 seconds post injection.  After no sensory deficits were reported, and normal lower extremity motor function was noted,   the above injectate was administered so that equal amounts of the injectate were placed at each foramen (level) into the transforaminal epidural space.   Additional Comments:  The patient tolerated the procedure well Dressing: 2 x 2 sterile gauze and Band-Aid    Post-procedure details: Patient was observed during the procedure. Post-procedure instructions were reviewed.  Patient left the clinic in stable condition.

## 2020-06-15 NOTE — Progress Notes (Signed)
Pt state lower back pain that travels down her left knee to her foot. Pt state laying in bed or standing at the kitchen sink makes the pain worse. Pt state she take pain meds and use CBD cream to help ease the pain. Pt has hx of inj on 01/12/20 pt state she not sure but recently she felt like to needed another inj.  Numeric Pain Rating Scale and Functional Assessment Average Pain 7   In the last MONTH (on 0-10 scale) has pain interfered with the following?  1. General activity like being  able to carry out your everyday physical activities such as walking, climbing stairs, carrying groceries, or moving a chair?  Rating(6)   +Driver, -BT, -Dye Allergies.

## 2020-06-30 DIAGNOSIS — I10 Essential (primary) hypertension: Secondary | ICD-10-CM | POA: Diagnosis not present

## 2020-06-30 DIAGNOSIS — E785 Hyperlipidemia, unspecified: Secondary | ICD-10-CM | POA: Diagnosis not present

## 2020-06-30 DIAGNOSIS — E039 Hypothyroidism, unspecified: Secondary | ICD-10-CM | POA: Diagnosis not present

## 2020-06-30 DIAGNOSIS — E7800 Pure hypercholesterolemia, unspecified: Secondary | ICD-10-CM | POA: Diagnosis not present

## 2020-06-30 DIAGNOSIS — K219 Gastro-esophageal reflux disease without esophagitis: Secondary | ICD-10-CM | POA: Diagnosis not present

## 2020-07-08 ENCOUNTER — Telehealth: Payer: Self-pay

## 2020-07-08 NOTE — Telephone Encounter (Signed)
Left hip injection 11/27/2019. Ok to repeat if helped, same problem/side, and no new injury?

## 2020-07-08 NOTE — Telephone Encounter (Signed)
Patient called she stated she is having pain in her groin area. [phone]

## 2020-07-08 NOTE — Telephone Encounter (Signed)
ok 

## 2020-07-09 ENCOUNTER — Telehealth: Payer: Self-pay

## 2020-07-09 NOTE — Telephone Encounter (Signed)
Left message #1

## 2020-07-09 NOTE — Telephone Encounter (Signed)
Scheduled for 2-17

## 2020-07-09 NOTE — Telephone Encounter (Signed)
Patient called she is returning your phone call. CB:[phone]

## 2020-07-22 ENCOUNTER — Other Ambulatory Visit: Payer: Self-pay

## 2020-07-22 ENCOUNTER — Ambulatory Visit: Payer: Self-pay

## 2020-07-22 ENCOUNTER — Ambulatory Visit (INDEPENDENT_AMBULATORY_CARE_PROVIDER_SITE_OTHER): Payer: Medicare HMO | Admitting: Physical Medicine and Rehabilitation

## 2020-07-22 ENCOUNTER — Encounter: Payer: Self-pay | Admitting: Physical Medicine and Rehabilitation

## 2020-07-22 DIAGNOSIS — M25552 Pain in left hip: Secondary | ICD-10-CM

## 2020-07-22 MED ORDER — TRIAMCINOLONE ACETONIDE 40 MG/ML IJ SUSP
60.0000 mg | INTRAMUSCULAR | Status: AC | PRN
  Administered 2020-07-22: 60 mg via INTRA_ARTICULAR

## 2020-07-22 MED ORDER — BUPIVACAINE HCL 0.25 % IJ SOLN
4.0000 mL | INTRAMUSCULAR | Status: AC | PRN
  Administered 2020-07-22: 4 mL via INTRA_ARTICULAR

## 2020-07-22 NOTE — Progress Notes (Signed)
Patient states pain is all over but complains today of left groin pain. Has some thigh soreness. Pt states has pain walking when it flares. Uses CBD and Voltaren gel which helps some.     Numeric Pain Rating Scale and Functional Assessment Average Pain (10)   In the last MONTH (on 0-10 scale) has pain interfered with the following?  1. General activity like being  able to carry out your everyday physical activities such as walking, climbing stairs, carrying groceries, or moving a chair?  Rating(3)   -Driver, -BT, -Dye Allergies.

## 2020-07-22 NOTE — Progress Notes (Signed)
   Barbara Patton - 80 y.o. female MRN [id number]  Date of birth: [birth year]  Office Visit Note: Visit Date: 07/22/2020 PCP: Seward Carol, MD Referred by: Seward Carol, MD  Subjective: Chief Complaint  Patient presents with  . Left Hip - Pain   HPI:  Barbara Patton is a 80 y.o. female who comes in today for planned repeat Left anesthetic hip arthrogram with fluoroscopic guidance.  The patient has failed conservative care including home exercise, medications, time and activity modification. Prior injection gave more than 50% relief for several months. This injection will be diagnostic and hopefully therapeutic.  Please see requesting physician notes for further details and justification.  Referring: Dr. Landry Dyke Dean    ROS Otherwise per HPI.  Assessment & Plan: Visit Diagnoses: No diagnosis found.  Plan: No additional findings.   Meds & Orders: No orders of the defined types were placed in this encounter.  No orders of the defined types were placed in this encounter.   Follow-up: No follow-ups on file.   Procedures: Large Joint Inj: L hip joint on 07/22/2020 9:22 AM Indications: diagnostic evaluation and pain Details: 22 G 3.5 in needle, fluoroscopy-guided anterior approach  Arthrogram: No  Medications: 4 mL bupivacaine 0.25 %; 60 mg triamcinolone acetonide 40 MG/ML Outcome: tolerated well, no immediate complications  There was excellent flow of contrast producing a partial arthrogram of the hip. The patient did have relief of symptoms during the anesthetic phase of the injection. Procedure, treatment alternatives, risks and benefits explained, specific risks discussed. Consent was given by the patient. Immediately prior to procedure a time out was called to verify the correct patient, procedure, equipment, support staff and site/side marked as required. Patient was prepped and draped in the usual sterile fashion.          Clinical History: CLINICAL DATA:  Chronic  low back pain with left leg pain for several years  EXAM: MRI LUMBAR SPINE WITHOUT CONTRAST  TECHNIQUE: Multiplanar, multisequence MR imaging of the lumbar spine was performed. No intravenous contrast was administered.  COMPARISON:  10/04/2009  FINDINGS: Segmentation:  Standard lumbar numbering  Alignment:  Mild anterolisthesis at L3-4 and L4-5.  Vertebrae:  No fracture, evidence of discitis, or bone lesion.  Conus medullaris and cauda equina: Conus extends to the L1-2 level. Conus and cauda equina appear normal.  Paraspinal and other soft tissues: Distended bladder which is trabeculated. Prominent atrophy of intrinsic back muscles. Partially covered sigmoid diverticulosis.  Disc levels:  T12- L1: Minor spondylosis and annulus bulging  L1-L2: Unremarkable for age  L2-L3: Mild facet spurring and annulus bulging.  L3-L4: Prominent facet osteoarthritis with spurring and effusions in the gaping joint spaces. Mild anterolisthesis. Mild disc narrowing and bulging. There is moderate spinal stenosis but no static nerve root compression.  L4-L5: Facet osteoarthritis with spurring and mild anterolisthesis. Mild disc bulging. No neural compression  L5-S1:Mild facet spurring and disc bulging.  IMPRESSION: 1. 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 2. 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 3. 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   Electronically Signed   By: Monte Fantasia M.D.   On: 12/05/2019 05:54.     Objective:  VS:  HT:    WT:   BMI:     BP:   HR: bpm  TEMP: ( )  RESP:  Physical Exam   Imaging: No results found.

## 2020-07-22 NOTE — Patient Instructions (Signed)

## 2020-08-05 DIAGNOSIS — H43813 Vitreous degeneration, bilateral: Secondary | ICD-10-CM | POA: Diagnosis not present

## 2020-08-05 DIAGNOSIS — H348122 Central retinal vein occlusion, left eye, stable: Secondary | ICD-10-CM | POA: Diagnosis not present

## 2020-08-05 DIAGNOSIS — H35373 Puckering of macula, bilateral: Secondary | ICD-10-CM | POA: Diagnosis not present

## 2020-08-05 DIAGNOSIS — H43393 Other vitreous opacities, bilateral: Secondary | ICD-10-CM | POA: Diagnosis not present

## 2020-08-10 ENCOUNTER — Other Ambulatory Visit (INDEPENDENT_AMBULATORY_CARE_PROVIDER_SITE_OTHER): Payer: Self-pay | Admitting: Surgical

## 2020-08-10 NOTE — Telephone Encounter (Signed)
Please advise 

## 2020-08-11 DIAGNOSIS — H52203 Unspecified astigmatism, bilateral: Secondary | ICD-10-CM | POA: Diagnosis not present

## 2020-08-11 DIAGNOSIS — H401133 Primary open-angle glaucoma, bilateral, severe stage: Secondary | ICD-10-CM | POA: Diagnosis not present

## 2020-08-20 ENCOUNTER — Telehealth: Payer: Self-pay | Admitting: Physical Medicine and Rehabilitation

## 2020-08-20 NOTE — Telephone Encounter (Signed)
Pt is calling and requesting an injection in her back. Please Call her [phone]

## 2020-08-20 NOTE — Telephone Encounter (Signed)
Pt had left L3-L4 Tf on 06/15/20 and would like an repeat. Pt has the same problem and no injury. Please advise

## 2020-08-23 NOTE — Telephone Encounter (Signed)
ok 

## 2020-08-26 DIAGNOSIS — E559 Vitamin D deficiency, unspecified: Secondary | ICD-10-CM | POA: Diagnosis not present

## 2020-08-26 DIAGNOSIS — H2011 Chronic iridocyclitis, right eye: Secondary | ICD-10-CM | POA: Diagnosis not present

## 2020-08-26 DIAGNOSIS — M5136 Other intervertebral disc degeneration, lumbar region: Secondary | ICD-10-CM | POA: Diagnosis not present

## 2020-08-26 DIAGNOSIS — R5383 Other fatigue: Secondary | ICD-10-CM | POA: Diagnosis not present

## 2020-08-26 DIAGNOSIS — Z6830 Body mass index (BMI) 30.0-30.9, adult: Secondary | ICD-10-CM | POA: Diagnosis not present

## 2020-08-26 DIAGNOSIS — M797 Fibromyalgia: Secondary | ICD-10-CM | POA: Diagnosis not present

## 2020-08-26 DIAGNOSIS — M150 Primary generalized (osteo)arthritis: Secondary | ICD-10-CM | POA: Diagnosis not present

## 2020-08-26 DIAGNOSIS — M329 Systemic lupus erythematosus, unspecified: Secondary | ICD-10-CM | POA: Diagnosis not present

## 2020-08-26 DIAGNOSIS — R21 Rash and other nonspecific skin eruption: Secondary | ICD-10-CM | POA: Diagnosis not present

## 2020-08-26 NOTE — Telephone Encounter (Signed)
Approve: Tracking #[id number]

## 2020-08-26 NOTE — Telephone Encounter (Signed)
Pt was sch.

## 2020-08-26 NOTE — Telephone Encounter (Signed)
Called pt and LVM #1 

## 2020-09-10 DIAGNOSIS — E785 Hyperlipidemia, unspecified: Secondary | ICD-10-CM | POA: Diagnosis not present

## 2020-09-10 DIAGNOSIS — E7800 Pure hypercholesterolemia, unspecified: Secondary | ICD-10-CM | POA: Diagnosis not present

## 2020-09-10 DIAGNOSIS — K219 Gastro-esophageal reflux disease without esophagitis: Secondary | ICD-10-CM | POA: Diagnosis not present

## 2020-09-10 DIAGNOSIS — I10 Essential (primary) hypertension: Secondary | ICD-10-CM | POA: Diagnosis not present

## 2020-09-10 DIAGNOSIS — E039 Hypothyroidism, unspecified: Secondary | ICD-10-CM | POA: Diagnosis not present

## 2020-09-14 ENCOUNTER — Encounter: Payer: Self-pay | Admitting: Physical Medicine and Rehabilitation

## 2020-09-14 ENCOUNTER — Other Ambulatory Visit: Payer: Self-pay

## 2020-09-14 ENCOUNTER — Ambulatory Visit (INDEPENDENT_AMBULATORY_CARE_PROVIDER_SITE_OTHER): Payer: Medicare HMO | Admitting: Physical Medicine and Rehabilitation

## 2020-09-14 ENCOUNTER — Ambulatory Visit: Payer: Self-pay

## 2020-09-14 VITALS — BP 127/76 | HR 79

## 2020-09-14 DIAGNOSIS — M5416 Radiculopathy, lumbar region: Secondary | ICD-10-CM

## 2020-09-14 MED ORDER — METHYLPREDNISOLONE ACETATE 80 MG/ML IJ SUSP
80.0000 mg | Freq: Once | INTRAMUSCULAR | Status: AC
  Administered 2020-09-14: 80 mg

## 2020-09-14 NOTE — Progress Notes (Signed)
Barbara Patton - 80 y.o. female MRN [id number]  Date of birth: 01/02/1941  Office Visit Note: Visit Date: 09/14/2020 PCP: Seward Carol, MD Referred by: Seward Carol, MD  Subjective: Chief Complaint  Patient presents with  . Lower Back - Pain  . Left Hip - Pain  . Left Leg - Pain   HPI:  Barbara Patton is a 80 y.o. female who comes in today at the request of Dr. Anderson Malta for planned Left L4-L5 Lumbar transforaminal epidural steroid injection with fluoroscopic guidance.  The patient has failed conservative care including home exercise, medications, time and activity modification.  This injection will be diagnostic and hopefully therapeutic.  Please see requesting physician notes for further details and justification. MRI reviewed with images and spine model.  MRI reviewed in the note below.    ROS Otherwise per HPI.  Assessment & Plan: Visit Diagnoses:    ICD-10-CM   1. Lumbar radiculopathy  M54.16 XR C-ARM NO REPORT    Epidural Steroid injection    methylPREDNISolone acetate (DEPO-MEDROL) injection 80 mg    Plan: No additional findings.   Meds & Orders:  Meds ordered this encounter  Medications  . methylPREDNISolone acetate (DEPO-MEDROL) injection 80 mg    Orders Placed This Encounter  Procedures  . XR C-ARM NO REPORT  . Epidural Steroid injection    Follow-up: Return if symptoms worsen or fail to improve.   Procedures: No procedures performed  Lumbosacral Transforaminal Epidural Steroid Injection - Sub-Pedicular Approach with Fluoroscopic Guidance  Patient: Barbara Patton      Date of Birth: August 05, 1940 MRN: [id number] PCP: Seward Carol, MD      Visit Date: 09/14/2020   Universal Protocol:    Date/Time: 09/14/2020  Consent Given By: the patient  Position: PRONE  Additional Comments: Vital signs were monitored before and after the procedure. Patient was prepped and draped in the usual sterile fashion. The correct patient, procedure, and site  was verified.   Injection Procedure Details:   Procedure diagnoses: Lumbar radiculopathy [M54.16]    Meds Administered:  Meds ordered this encounter  Medications  . methylPREDNISolone acetate (DEPO-MEDROL) injection 80 mg    Laterality: Left  Location/Site:  L3-L4  Needle:5.0 in., 22 ga.  Short bevel or Quincke spinal needle  Needle Placement: Transforaminal  Findings:    -Comments: Excellent flow of contrast along the nerve, nerve root and into the epidural space.  Procedure Details: After squaring off the end-plates to get a true AP view, the C-arm was positioned so that an oblique view of the foramen as noted above was visualized. The target area is just inferior to the "nose of the scotty dog" or sub pedicular. The soft tissues overlying this structure were infiltrated with 2-3 ml. of 1% Lidocaine without Epinephrine.  The spinal needle was inserted toward the target using a "trajectory" view along the fluoroscope beam.  Under AP and lateral visualization, the needle was advanced so it did not puncture dura and was located close the 6 O'Clock position of the pedical in AP tracterory. Biplanar projections were used to confirm position. Aspiration was confirmed to be negative for CSF and/or blood. A 1-2 ml. volume of Isovue-250 was injected and flow of contrast was noted at each level. Radiographs were obtained for documentation purposes.   After attaining the desired flow of contrast documented above, a 0.5 to 1.0 ml test dose of 0.25% Marcaine was injected into each respective transforaminal space.  The patient was observed for  90 seconds post injection.  After no sensory deficits were reported, and normal lower extremity motor function was noted,   the above injectate was administered so that equal amounts of the injectate were placed at each foramen (level) into the transforaminal epidural space.   Additional Comments:  The patient tolerated the procedure well Dressing: 2 x  2 sterile gauze and Band-Aid    Post-procedure details: Patient was observed during the procedure. Post-procedure instructions were reviewed.  Patient left the clinic in stable condition.      Clinical History: CLINICAL DATA:  Chronic low back pain with left leg pain for several years  EXAM: MRI LUMBAR SPINE WITHOUT CONTRAST  TECHNIQUE: Multiplanar, multisequence MR imaging of the lumbar spine was performed. No intravenous contrast was administered.  COMPARISON:  10/04/2009  FINDINGS: Segmentation:  Standard lumbar numbering  Alignment:  Mild anterolisthesis at L3-4 and L4-5.  Vertebrae:  No fracture, evidence of discitis, or bone lesion.  Conus medullaris and cauda equina: Conus extends to the L1-2 level. Conus and cauda equina appear normal.  Paraspinal and other soft tissues: Distended bladder which is trabeculated. Prominent atrophy of intrinsic back muscles. Partially covered sigmoid diverticulosis.  Disc levels:  T12- L1: Minor spondylosis and annulus bulging  L1-L2: Unremarkable for age  L2-L3: Mild facet spurring and annulus bulging.  L3-L4: Prominent facet osteoarthritis with spurring and effusions in the gaping joint spaces. Mild anterolisthesis. Mild disc narrowing and bulging. There is moderate spinal stenosis but no static nerve root compression.  L4-L5: Facet osteoarthritis with spurring and mild anterolisthesis. Mild disc bulging. No neural compression  L5-S1:Mild facet spurring and disc bulging.  IMPRESSION: 1. 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 2. 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 3. 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   Electronically Signed   By: Monte Fantasia M.D.   On: 12/05/2019 05:54.     Objective:  VS:  HT:    WT:   BMI:     BP:127/76  HR:79bpm   TEMP: ( )  RESP:  Physical Exam Vitals and nursing note reviewed.  Constitutional:      General: She is not in acute distress.    Appearance: Normal appearance. She is not ill-appearing.  HENT:     Head: Normocephalic and atraumatic.     Right Ear: External ear normal.     Left Ear: External ear normal.  Eyes:     Extraocular Movements: Extraocular movements intact.  Cardiovascular:     Rate and Rhythm: Normal rate.     Pulses: Normal pulses.  Pulmonary:     Effort: Pulmonary effort is normal. No respiratory distress.  Abdominal:     General: There is no distension.     Palpations: Abdomen is soft.  Musculoskeletal:        General: Tenderness present.     Cervical back: Neck supple.     Right lower leg: No edema.     Left lower leg: No edema.     Comments: Patient has good distal strength with no pain over the greater trochanters.  No clonus or focal weakness.  Skin:    Findings: No erythema, lesion or rash.  Neurological:     General: No focal deficit present.     Mental Status: She is alert and oriented to person, place, and time.     Sensory: No sensory deficit.     Motor: No  weakness or abnormal muscle tone.     Coordination: Coordination normal.  Psychiatric:        Mood and Affect: Mood normal.        Behavior: Behavior normal.      Imaging: No results found.

## 2020-09-14 NOTE — Progress Notes (Signed)
Pt state Lower back that travels down her left butt and groin pain. Pt state walking, standing and bending makes the pain worse. Pt state take pain meds and creams to help ease her pain. Pt has hx of inj on 06/15/20 Pt state it helped for a few weeks.  Numeric Pain Rating Scale and Functional Assessment Average Pain 6   In the last MONTH (on 0-10 scale) has pain interfered with the following?  1. General activity like being  able to carry out your everyday physical activities such as walking, climbing stairs, carrying groceries, or moving a chair?  Rating(9)   +Driver, -BT, -Dye Allergies.

## 2020-09-14 NOTE — Patient Instructions (Signed)

## 2020-09-22 ENCOUNTER — Telehealth: Payer: Self-pay | Admitting: Physical Medicine and Rehabilitation

## 2020-09-22 DIAGNOSIS — H401132 Primary open-angle glaucoma, bilateral, moderate stage: Secondary | ICD-10-CM | POA: Diagnosis not present

## 2020-09-22 DIAGNOSIS — Z79899 Other long term (current) drug therapy: Secondary | ICD-10-CM | POA: Diagnosis not present

## 2020-09-22 NOTE — Telephone Encounter (Signed)
Patient called advised the injection in her back did not work. The number to contact patient is [phone]

## 2020-09-22 NOTE — Telephone Encounter (Signed)
Pt was last seen 09/14/20  Left L3-L4 TF. Please advise

## 2020-09-23 DIAGNOSIS — L749 Eccrine sweat disorder, unspecified: Secondary | ICD-10-CM | POA: Diagnosis not present

## 2020-09-27 NOTE — Telephone Encounter (Signed)
Called pt and sch 5/9 

## 2020-10-11 ENCOUNTER — Ambulatory Visit: Payer: Self-pay

## 2020-10-11 ENCOUNTER — Ambulatory Visit (INDEPENDENT_AMBULATORY_CARE_PROVIDER_SITE_OTHER): Payer: Medicare HMO | Admitting: Physical Medicine and Rehabilitation

## 2020-10-11 ENCOUNTER — Encounter: Payer: Self-pay | Admitting: Physical Medicine and Rehabilitation

## 2020-10-11 ENCOUNTER — Ambulatory Visit: Payer: Medicare HMO | Admitting: Physical Medicine and Rehabilitation

## 2020-10-11 ENCOUNTER — Other Ambulatory Visit: Payer: Self-pay

## 2020-10-11 VITALS — BP 160/66 | HR 98

## 2020-10-11 DIAGNOSIS — M5416 Radiculopathy, lumbar region: Secondary | ICD-10-CM

## 2020-10-11 MED ORDER — BETAMETHASONE SOD PHOS & ACET 6 (3-3) MG/ML IJ SUSP
12.0000 mg | Freq: Once | INTRAMUSCULAR | Status: AC
Start: 2020-10-11 — End: 2020-10-11
  Administered 2020-10-11: 12 mg

## 2020-10-11 NOTE — Procedures (Signed)
Lumbosacral Transforaminal Epidural Steroid Injection - Sub-Pedicular Approach with Fluoroscopic Guidance  Patient: Barbara Patton      Date of Birth: 1940/08/05 MRN: [id number] PCP: Seward Carol, MD      Visit Date: 09/14/2020   Universal Protocol:    Date/Time: 09/14/2020  Consent Given By: the patient  Position: PRONE  Additional Comments: Vital signs were monitored before and after the procedure. Patient was prepped and draped in the usual sterile fashion. The correct patient, procedure, and site was verified.   Injection Procedure Details:   Procedure diagnoses: Lumbar radiculopathy [M54.16]    Meds Administered:  Meds ordered this encounter  Medications  . methylPREDNISolone acetate (DEPO-MEDROL) injection 80 mg    Laterality: Left  Location/Site:  L3-L4  Needle:5.0 in., 22 ga.  Short bevel or Quincke spinal needle  Needle Placement: Transforaminal  Findings:    -Comments: Excellent flow of contrast along the nerve, nerve root and into the epidural space.  Procedure Details: After squaring off the end-plates to get a true AP view, the C-arm was positioned so that an oblique view of the foramen as noted above was visualized. The target area is just inferior to the "nose of the scotty dog" or sub pedicular. The soft tissues overlying this structure were infiltrated with 2-3 ml. of 1% Lidocaine without Epinephrine.  The spinal needle was inserted toward the target using a "trajectory" view along the fluoroscope beam.  Under AP and lateral visualization, the needle was advanced so it did not puncture dura and was located close the 6 O'Clock position of the pedical in AP tracterory. Biplanar projections were used to confirm position. Aspiration was confirmed to be negative for CSF and/or blood. A 1-2 ml. volume of Isovue-250 was injected and flow of contrast was noted at each level. Radiographs were obtained for documentation purposes.   After attaining the desired  flow of contrast documented above, a 0.5 to 1.0 ml test dose of 0.25% Marcaine was injected into each respective transforaminal space.  The patient was observed for 90 seconds post injection.  After no sensory deficits were reported, and normal lower extremity motor function was noted,   the above injectate was administered so that equal amounts of the injectate were placed at each foramen (level) into the transforaminal epidural space.   Additional Comments:  The patient tolerated the procedure well Dressing: 2 x 2 sterile gauze and Band-Aid    Post-procedure details: Patient was observed during the procedure. Post-procedure instructions were reviewed.  Patient left the clinic in stable condition.

## 2020-10-11 NOTE — Progress Notes (Signed)
Pt state lower back pain. Pt state walking, standing and sitting makes the pain worse. Pt state she takes pain meds to help ease her pain. Pt has hx of inj on 09/14/20 pt state it didn't help.  Numeric Pain Rating Scale and Functional Assessment Average Pain 9   In the last MONTH (on 0-10 scale) has pain interfered with the following?  1. General activity like being  able to carry out your everyday physical activities such as walking, climbing stairs, carrying groceries, or moving a chair?  Rating(10)   +Driver, -BT, -Dye Allergies.

## 2020-10-11 NOTE — Procedures (Signed)
Lumbar Epidural Steroid Injection - Interlaminar Approach with Fluoroscopic Guidance  Patient: Barbara Patton      Date of Birth: [birth year] MRN: [id number] PCP: Seward Carol, MD      Visit Date: 10/11/2020   Universal Protocol:     Consent Given By: the patient  Position: PRONE  Additional Comments: Vital signs were monitored before and after the procedure. Patient was prepped and draped in the usual sterile fashion. The correct patient, procedure, and site was verified.   Injection Procedure Details:   Procedure diagnoses: Lumbar radiculopathy [M54.16]   Meds Administered:  Meds ordered this encounter  Medications  . betamethasone acetate-betamethasone sodium phosphate (CELESTONE) injection 12 mg     Laterality: Left  Location/Site:  L4-L5  Needle: 3.5 in., 20 ga. Tuohy  Needle Placement: Paramedian epidural  Findings:   -Comments: Excellent flow of contrast into the epidural space.  Procedure Details: Using a paramedian approach from the side mentioned above, the region overlying the inferior lamina was localized under fluoroscopic visualization and the soft tissues overlying this structure were infiltrated with 4 ml. of 1% Lidocaine without Epinephrine. The Tuohy needle was inserted into the epidural space using a paramedian approach.   The epidural space was localized using loss of resistance along with counter oblique bi-planar fluoroscopic views.  After negative aspirate for air, blood, and CSF, a 2 ml. volume of Isovue-250 was injected into the epidural space and the flow of contrast was observed. Radiographs were obtained for documentation purposes.    The injectate was administered into the level noted above.   Additional Comments:  The patient tolerated the procedure well Dressing: 2 x 2 sterile gauze and Band-Aid    Post-procedure details: Patient was observed during the procedure. Post-procedure instructions were reviewed.  Patient left the  clinic in stable condition.

## 2020-10-11 NOTE — Patient Instructions (Signed)

## 2020-10-11 NOTE — Progress Notes (Signed)
Barbara Patton - 80 y.o. female MRN [id number]  Date of birth: 08-16-1940  Office Visit Note: Visit Date: 10/11/2020 PCP: Seward Carol, MD Referred by: Seward Carol, MD  Subjective: Chief Complaint  Patient presents with  . Lower Back - Pain   HPI:  Barbara Patton is a 80 y.o. female who comes in today at the request of Dr. Anderson Malta for planned Left L4-L5 Lumbar epidural steroid injection with fluoroscopic guidance.  The patient has failed conservative care including home exercise, medications, time and activity modification.  This injection will be diagnostic and hopefully therapeutic.  Please see requesting physician notes for further details and justification. MRI reviewed with images and spine model.  MRI reviewed in the note below.  Patient with chronic worsening severe low back pain left more than right radiating into the hip.  Known hip arthritis with good relief with intra-articular injections at times.  She does have moderate stenosis at L3-4.  Last injection performed in April was a transforaminal approach that diagnostically helped but did not last very long at all.  No high-grade stenosis.  We can try an interlaminar approach to see if that will give her more long-lasting relief and to be diagnostic as well.  Case is complicated by fibromyalgia.   ROS Otherwise per HPI.  Assessment & Plan: Visit Diagnoses:    ICD-10-CM   1. Lumbar radiculopathy  M54.16 XR C-ARM NO REPORT    Epidural Steroid injection    betamethasone acetate-betamethasone sodium phosphate (CELESTONE) injection 12 mg    Plan: No additional findings.   Meds & Orders:  Meds ordered this encounter  Medications  . betamethasone acetate-betamethasone sodium phosphate (CELESTONE) injection 12 mg    Orders Placed This Encounter  Procedures  . XR C-ARM NO REPORT  . Epidural Steroid injection    Follow-up: Return for visit to requesting physician as needed.   Procedures: No procedures performed   Lumbar Epidural Steroid Injection - Interlaminar Approach with Fluoroscopic Guidance  Patient: Barbara Patton      Date of Birth: 1940/10/20 MRN: [id number] PCP: Seward Carol, MD      Visit Date: 10/11/2020   Universal Protocol:     Consent Given By: the patient  Position: PRONE  Additional Comments: Vital signs were monitored before and after the procedure. Patient was prepped and draped in the usual sterile fashion. The correct patient, procedure, and site was verified.   Injection Procedure Details:   Procedure diagnoses: Lumbar radiculopathy [M54.16]   Meds Administered:  Meds ordered this encounter  Medications  . betamethasone acetate-betamethasone sodium phosphate (CELESTONE) injection 12 mg     Laterality: Left  Location/Site:  L4-L5  Needle: 3.5 in., 20 ga. Tuohy  Needle Placement: Paramedian epidural  Findings:   -Comments: Excellent flow of contrast into the epidural space.  Procedure Details: Using a paramedian approach from the side mentioned above, the region overlying the inferior lamina was localized under fluoroscopic visualization and the soft tissues overlying this structure were infiltrated with 4 ml. of 1% Lidocaine without Epinephrine. The Tuohy needle was inserted into the epidural space using a paramedian approach.   The epidural space was localized using loss of resistance along with counter oblique bi-planar fluoroscopic views.  After negative aspirate for air, blood, and CSF, a 2 ml. volume of Isovue-250 was injected into the epidural space and the flow of contrast was observed. Radiographs were obtained for documentation purposes.    The injectate was administered into the level  noted above.   Additional Comments:  The patient tolerated the procedure well Dressing: 2 x 2 sterile gauze and Band-Aid    Post-procedure details: Patient was observed during the procedure. Post-procedure instructions were reviewed.  Patient left the  clinic in stable condition.     Clinical History: CLINICAL DATA:  Chronic low back pain with left leg pain for several years  EXAM: MRI LUMBAR SPINE WITHOUT CONTRAST  TECHNIQUE: Multiplanar, multisequence MR imaging of the lumbar spine was performed. No intravenous contrast was administered.  COMPARISON:  10/04/2009  FINDINGS: Segmentation:  Standard lumbar numbering  Alignment:  Mild anterolisthesis at L3-4 and L4-5.  Vertebrae:  No fracture, evidence of discitis, or bone lesion.  Conus medullaris and cauda equina: Conus extends to the L1-2 level. Conus and cauda equina appear normal.  Paraspinal and other soft tissues: Distended bladder which is trabeculated. Prominent atrophy of intrinsic back muscles. Partially covered sigmoid diverticulosis.  Disc levels:  T12- L1: Minor spondylosis and annulus bulging  L1-L2: Unremarkable for age  L2-L3: Mild facet spurring and annulus bulging.  L3-L4: Prominent facet osteoarthritis with spurring and effusions in the gaping joint spaces. Mild anterolisthesis. Mild disc narrowing and bulging. There is moderate spinal stenosis but no static nerve root compression.  L4-L5: Facet osteoarthritis with spurring and mild anterolisthesis. Mild disc bulging. No neural compression  L5-S1:Mild facet spurring and disc bulging.  IMPRESSION: 1. 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 2. 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 3. 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   Electronically Signed   By: Monte Fantasia M.D.   On: 12/05/2019 05:54.     Objective:  VS:  HT:    WT:   BMI:     BP:(!) 160/66  HR:98bpm  TEMP: ( )  RESP:  Physical Exam Vitals and nursing note reviewed.  Constitutional:      General: She is not in acute distress.    Appearance: Normal  appearance. She is not ill-appearing.  HENT:     Head: Normocephalic and atraumatic.     Right Ear: External ear normal.     Left Ear: External ear normal.  Eyes:     Extraocular Movements: Extraocular movements intact.  Cardiovascular:     Rate and Rhythm: Normal rate.     Pulses: Normal pulses.  Pulmonary:     Effort: Pulmonary effort is normal. No respiratory distress.  Abdominal:     General: There is no distension.     Palpations: Abdomen is soft.  Musculoskeletal:        General: Tenderness present.     Cervical back: Neck supple.     Right lower leg: No edema.     Left lower leg: No edema.     Comments: Patient has good distal strength with no pain over the greater trochanters.  No clonus or focal weakness.  Skin:    Findings: No erythema, lesion or rash.  Neurological:     General: No focal deficit present.     Mental Status: She is alert and oriented to person, place, and time.     Sensory: No sensory deficit.     Motor: No weakness or abnormal muscle tone.     Coordination: Coordination normal.  Psychiatric:        Mood and Affect: Mood normal.        Behavior: Behavior normal.      Imaging:  No results found.

## 2020-10-19 ENCOUNTER — Telehealth: Payer: Self-pay

## 2020-10-19 NOTE — Telephone Encounter (Signed)
[name] from [facility] called she is requesting a rx refill for methocarbamol callback:[phone]

## 2020-10-19 NOTE — Telephone Encounter (Signed)
Ok for this? 

## 2020-10-20 ENCOUNTER — Other Ambulatory Visit: Payer: Self-pay | Admitting: Surgical

## 2020-10-20 ENCOUNTER — Telehealth: Payer: Self-pay | Admitting: Orthopedic Surgery

## 2020-10-20 MED ORDER — METHOCARBAMOL 500 MG PO TABS: 1.0000 | ORAL_TABLET | Freq: Two times a day (BID) | ORAL | 0 refills | Status: DC | PRN

## 2020-10-20 NOTE — Telephone Encounter (Signed)
I called patient. She requests medication be sent to the [facility] mail order pharmacy.   Rx for 90 day sent in to [facility]. Patient aware.

## 2020-10-20 NOTE — Telephone Encounter (Signed)
Sent in

## 2020-10-20 NOTE — Telephone Encounter (Signed)
Yes okay for this

## 2020-10-20 NOTE — Telephone Encounter (Signed)
Pt called stating she would like to have her methocarbamol rx chnaged to 90 days because [employer] will cover the full cost; pt states Barbara Patton is faxing a request to have this done as well. She would like a CB when this has been received and changed.   [phone]

## 2020-10-20 NOTE — Telephone Encounter (Signed)
Ok for this? 

## 2020-10-21 ENCOUNTER — Ambulatory Visit: Payer: Medicare HMO | Admitting: Physical Medicine and Rehabilitation

## 2020-11-02 ENCOUNTER — Telehealth: Payer: Self-pay

## 2020-11-02 DIAGNOSIS — N320 Bladder-neck obstruction: Secondary | ICD-10-CM | POA: Diagnosis not present

## 2020-11-02 DIAGNOSIS — M797 Fibromyalgia: Secondary | ICD-10-CM | POA: Diagnosis not present

## 2020-11-02 DIAGNOSIS — Z0000 Encounter for general adult medical examination without abnormal findings: Secondary | ICD-10-CM | POA: Diagnosis not present

## 2020-11-02 DIAGNOSIS — M329 Systemic lupus erythematosus, unspecified: Secondary | ICD-10-CM | POA: Diagnosis not present

## 2020-11-02 DIAGNOSIS — E7800 Pure hypercholesterolemia, unspecified: Secondary | ICD-10-CM | POA: Diagnosis not present

## 2020-11-02 DIAGNOSIS — I10 Essential (primary) hypertension: Secondary | ICD-10-CM | POA: Diagnosis not present

## 2020-11-02 DIAGNOSIS — Z1389 Encounter for screening for other disorder: Secondary | ICD-10-CM | POA: Diagnosis not present

## 2020-11-02 DIAGNOSIS — I5189 Other ill-defined heart diseases: Secondary | ICD-10-CM | POA: Diagnosis not present

## 2020-11-02 DIAGNOSIS — E039 Hypothyroidism, unspecified: Secondary | ICD-10-CM | POA: Diagnosis not present

## 2020-11-02 NOTE — Telephone Encounter (Signed)
Left L4-5 IL on 5/9. States she had good relief for a few weeks. She states that her pain is different now and feels like muscle pain. Please advise.

## 2020-11-02 NOTE — Telephone Encounter (Signed)
Patient called she stated her back pain has gotten worse and she is hurting all over call [phone]

## 2020-11-03 NOTE — Telephone Encounter (Signed)
Called patient to advise. She expressed understanding.

## 2020-11-15 DIAGNOSIS — M71572 Other bursitis, not elsewhere classified, left ankle and foot: Secondary | ICD-10-CM | POA: Diagnosis not present

## 2020-11-15 DIAGNOSIS — M722 Plantar fascial fibromatosis: Secondary | ICD-10-CM | POA: Diagnosis not present

## 2020-11-15 DIAGNOSIS — M7732 Calcaneal spur, left foot: Secondary | ICD-10-CM | POA: Diagnosis not present

## 2020-11-23 DIAGNOSIS — R338 Other retention of urine: Secondary | ICD-10-CM | POA: Diagnosis not present

## 2020-11-30 DIAGNOSIS — N139 Obstructive and reflux uropathy, unspecified: Secondary | ICD-10-CM | POA: Diagnosis not present

## 2020-12-08 ENCOUNTER — Telehealth: Payer: Self-pay | Admitting: Physical Medicine and Rehabilitation

## 2020-12-08 NOTE — Telephone Encounter (Signed)
Pt called stating she is still having back pain and would like an appt to address it.   [phone]

## 2020-12-09 ENCOUNTER — Telehealth: Payer: Self-pay | Admitting: Physical Medicine and Rehabilitation

## 2020-12-09 NOTE — Telephone Encounter (Signed)
Office visit scheduled with [facility] 12/21/2020

## 2020-12-09 NOTE — Telephone Encounter (Signed)
PT called and would like to schedule an appt with [facility].   CB [phone]

## 2020-12-15 DIAGNOSIS — L749 Eccrine sweat disorder, unspecified: Secondary | ICD-10-CM | POA: Diagnosis not present

## 2020-12-15 DIAGNOSIS — G252 Other specified forms of tremor: Secondary | ICD-10-CM | POA: Diagnosis not present

## 2020-12-15 DIAGNOSIS — M797 Fibromyalgia: Secondary | ICD-10-CM | POA: Diagnosis not present

## 2020-12-16 ENCOUNTER — Encounter: Payer: Self-pay | Admitting: Neurology

## 2020-12-21 ENCOUNTER — Ambulatory Visit (INDEPENDENT_AMBULATORY_CARE_PROVIDER_SITE_OTHER): Payer: Medicare HMO | Admitting: Physical Medicine and Rehabilitation

## 2020-12-21 ENCOUNTER — Encounter: Payer: Self-pay | Admitting: Physical Medicine and Rehabilitation

## 2020-12-21 ENCOUNTER — Other Ambulatory Visit: Payer: Self-pay

## 2020-12-21 VITALS — BP 127/73 | HR 92

## 2020-12-21 DIAGNOSIS — M797 Fibromyalgia: Secondary | ICD-10-CM | POA: Diagnosis not present

## 2020-12-21 DIAGNOSIS — H52223 Regular astigmatism, bilateral: Secondary | ICD-10-CM | POA: Diagnosis not present

## 2020-12-21 DIAGNOSIS — H524 Presbyopia: Secondary | ICD-10-CM | POA: Diagnosis not present

## 2020-12-21 DIAGNOSIS — M48062 Spinal stenosis, lumbar region with neurogenic claudication: Secondary | ICD-10-CM | POA: Diagnosis not present

## 2020-12-21 DIAGNOSIS — G894 Chronic pain syndrome: Secondary | ICD-10-CM

## 2020-12-21 DIAGNOSIS — M5416 Radiculopathy, lumbar region: Secondary | ICD-10-CM

## 2020-12-21 NOTE — Progress Notes (Signed)
Left L4-5 IL on 10/11/2020. Some relief from that injection. Pain in low back radiates up and out to sides. Bilateral groin and buttock pain.  Numeric Pain Rating Scale and Functional Assessment Average Pain 10 Pain Right Now 5 My pain is constant, dull, and aching Pain is worse with: standing Pain improves with:  CBD cream   In the last MONTH (on 0-10 scale) has pain interfered with the following?  1. General activity like being  able to carry out your everyday physical activities such as walking, climbing stairs, carrying groceries, or moving a chair?  Rating(7)  2. Relation with others like being able to carry out your usual social activities and roles such as  activities at home, at work and in your community. Rating(8)  3. Enjoyment of life such that you have  been bothered by emotional problems such as feeling anxious, depressed or irritable?  Rating(4)

## 2020-12-21 NOTE — Progress Notes (Signed)
Barbara Patton - 80 y.o. female MRN [id number]  Date of birth: 1941/01/24  Office Visit Note: Visit Date: 12/21/2020 PCP: Seward Carol, MD Referred by: Seward Carol, MD  Subjective: Chief Complaint  Patient presents with   Lower Back - Pain   HPI: Barbara Patton is a 80 y.o. female who comes in today at the request of Dr. Meredith Pel for evaluation of chronic, worsening and severe bilateral lower back pain radiating to buttocks and hips. Patient reports continued lower back pain, but states pain is now radiating up her back for the past several months. Patient reports pain worsens with walking and prolonged standing, rates pain 5 out of 10 at present. She reports some pain relief with Tylenol and heating pad at home. Patient had left L4-L5 interlaminar epidural steroid injection in May, which she reports 50% pain relief for one month. 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 Patient continues to see Dr. Seward Carol for her fibromyalgia and Leafy Kindle, PA with rheumatology for her lupus.  Patient attended physical therapy in our facility in 2021, which did seem to help her chronic back pain. Patient denies focal weakness, numbness and tingling.   Review of Systems  Musculoskeletal:  Positive for back pain and myalgias.  Neurological:  Negative for tingling and focal weakness.  All other systems reviewed and are negative. Otherwise per HPI.  Assessment & Plan: Visit Diagnoses:    ICD-10-CM   1. Lumbar radiculopathy  M54.16 Ambulatory referral to Physical Therapy    2. Spinal stenosis of lumbar region with neurogenic claudication  M48.062     3. Fibromyalgia  M79.7     4. Chronic pain syndrome  G89.4        Plan: Findings:  Chronic, worsening and severe lumbar radiculopathy. Patient continues to complain of bilateral lower back pain radiating to buttocks and hips, however over the last  several months the pain is moving up her back. Patient's exam is consistent with L4 vs L5 nerve distrubution. Patient also continues to have generalized pain secondary to her fibromyalgia and we believe that the pain moving up her back is very much myofascial and could be related.  Unfortunately her underlying fibromyalgia has been a significant confounding issue over the last several years.  Patient's recent lumbar MRI exhibits moderate spinal stenosis at L3-L4 and we believe the next step is to perform diagnostic and hopefully therapeutic left L4-L5 interlaminar epidural steroid injection. Referral placed today for physical therapy treatment and evaluation with focus on manual therapy and dry needling to bilateral flank areas if needed.  Patient continues to try to stay active and she is active in bowling league's.  Her bowling league is coming up in the next few months.  Hopefully we can get her Tdap so she can continue to do things that she enjoys.   Meds & Orders: No orders of the defined types were placed in this encounter.   Orders Placed This Encounter  Procedures   Ambulatory referral to Physical Therapy    Follow-up: Return in about 2 days (around 12/23/2020) for left L4-L5 interlaminar epidural steroid injection.   Procedures: No procedures performed      Clinical History: CLINICAL DATA:  Chronic low back pain with left leg pain for several years   EXAM: MRI LUMBAR SPINE WITHOUT CONTRAST   TECHNIQUE: Multiplanar, multisequence MR imaging of the lumbar spine was performed. No intravenous contrast was  administered.   COMPARISON:  10/04/2009   FINDINGS: Segmentation:  Standard lumbar numbering   Alignment:  Mild anterolisthesis at L3-4 and L4-5.   Vertebrae:  No fracture, evidence of discitis, or bone lesion.   Conus medullaris and cauda equina: Conus extends to the L1-2 level. Conus and cauda equina appear normal.   Paraspinal and other soft tissues: Distended bladder  which is trabeculated. Prominent atrophy of intrinsic back muscles. Partially covered sigmoid diverticulosis.   Disc levels:   T12- L1: Minor spondylosis and annulus bulging   L1-L2: Unremarkable for age   L2-L3: Mild facet spurring and annulus bulging.   L3-L4: Prominent facet osteoarthritis with spurring and effusions in the gaping joint spaces. Mild anterolisthesis. Mild disc narrowing and bulging. There is moderate spinal stenosis but no static nerve root compression.   L4-L5: Facet osteoarthritis with spurring and mild anterolisthesis. Mild disc bulging. No neural compression   L5-S1:Mild facet spurring and disc bulging.   IMPRESSION: 1. 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 2. 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 3. 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     Electronically Signed   By: Monte Fantasia M.D.   On: 12/05/2019 05:54.   She reports that she quit smoking about 25 years ago. She has never used smokeless tobacco. No results for input(s): HGBA1C, LABURIC in the last 8760 hours.  Objective:  VS:  HT:    WT:   BMI:     BP:127/73  HR:92bpm  TEMP: ( )  RESP:  Physical Exam Constitutional:      Appearance: Normal appearance.  HENT:     Head: Normocephalic.     Right Ear: Tympanic membrane normal.     Left Ear: Tympanic membrane normal.     Nose: Nose normal.     Mouth/Throat:     Mouth: Mucous membranes are moist.  Eyes:     Pupils: Pupils are equal, round, and reactive to light.  Cardiovascular:     Rate and Rhythm: Normal rate.     Pulses: Normal pulses.  Pulmonary:     Effort: Pulmonary effort is normal.  Abdominal:     General: There is no distension.  Musculoskeletal:     Cervical back: Normal range of motion and neck supple.     Comments: Pt is slow to rise from seated to standing position.  Good lumbar range of motion. Strong distal strength without clonus. No pain upon palpation of greater trochanters. Sensation intact bilaterally. Walks independently, gait steady.     Skin:    General: Skin is warm and dry.     Capillary Refill: Capillary refill takes less than 2 seconds.  Neurological:     General: No focal deficit present.     Mental Status: She is alert.  Psychiatric:        Mood and Affect: Mood normal.    Ortho Exam  Imaging: No results found.  Past Medical/Family/Surgical/Social History: Medications & Allergies reviewed per EMR, new medications updated. 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) 03/16/2011   Presence of artificial knee joint 03/16/2011   Past Medical History:  Diagnosis Date   High cholesterol    Hypertension    Thyroid disease    History reviewed. No pertinent family history. Past Surgical History:  Procedure Laterality Date   JOINT REPLACEMENT     Social History   Occupational History   Not on file  Tobacco Use   Smoking status: Former    Types: Cigarettes    Quit date: 1997    Years since quitting:  25.5   Smokeless tobacco: Never  Substance and Sexual Activity   Alcohol use: Yes    Alcohol/week: 1.0 standard drink    Types: 1 Glasses of wine per week    Comment: occ wine   Drug use: No   Sexual activity: Not on file

## 2020-12-23 ENCOUNTER — Ambulatory Visit (INDEPENDENT_AMBULATORY_CARE_PROVIDER_SITE_OTHER): Payer: Medicare HMO | Admitting: Physical Medicine and Rehabilitation

## 2020-12-23 ENCOUNTER — Encounter: Payer: Self-pay | Admitting: Physical Medicine and Rehabilitation

## 2020-12-23 ENCOUNTER — Other Ambulatory Visit: Payer: Self-pay

## 2020-12-23 ENCOUNTER — Ambulatory Visit: Payer: Self-pay

## 2020-12-23 VITALS — BP 146/76 | HR 88

## 2020-12-23 DIAGNOSIS — M48062 Spinal stenosis, lumbar region with neurogenic claudication: Secondary | ICD-10-CM

## 2020-12-23 DIAGNOSIS — M5416 Radiculopathy, lumbar region: Secondary | ICD-10-CM

## 2020-12-23 MED ORDER — BETAMETHASONE SOD PHOS & ACET 6 (3-3) MG/ML IJ SUSP
12.0000 mg | Freq: Once | INTRAMUSCULAR | Status: AC
  Administered 2020-12-23: 12 mg

## 2020-12-23 NOTE — Patient Instructions (Signed)

## 2020-12-23 NOTE — Progress Notes (Signed)
Pt state lower back pain. Pt state walking and standing makes the pain worse. Pt state she takes pain meds to help ease her pain. Pt has hx of inj on 10/11/20 pt state it helped with 95% relief.  Numeric Pain Rating Scale and Functional Assessment Average Pain 10   In the last MONTH (on 0-10 scale) has pain interfered with the following?  1. General activity like being  able to carry out your everyday physical activities such as walking, climbing stairs, carrying groceries, or moving a chair?  Rating(10)   +Driver, -BT, -Dye Allergies.

## 2020-12-24 NOTE — Procedures (Signed)
Lumbar Epidural Steroid Injection - Interlaminar Approach with Fluoroscopic Guidance  Patient: Barbara Patton      Date of Birth: 10/29/1940 MRN: [id number] PCP: Seward Carol, MD      Visit Date: 12/23/2020   Universal Protocol:     Consent Given By: the patient  Position: PRONE  Additional Comments: Vital signs were monitored before and after the procedure. Patient was prepped and draped in the usual sterile fashion. The correct patient, procedure, and site was verified.   Injection Procedure Details:   Procedure diagnoses: Lumbar radiculopathy [M54.16]   Meds Administered:  Meds ordered this encounter  Medications   betamethasone acetate-betamethasone sodium phosphate (CELESTONE) injection 12 mg     Laterality: Left  Location/Site:  L4-L5  Needle: 3.5 in., 20 ga. Tuohy  Needle Placement: Paramedian epidural  Findings:   -Comments: Excellent flow of contrast into the epidural space.  Procedure Details: Using a paramedian approach from the side mentioned above, the region overlying the inferior lamina was localized under fluoroscopic visualization and the soft tissues overlying this structure were infiltrated with 4 ml. of 1% Lidocaine without Epinephrine. The Tuohy needle was inserted into the epidural space using a paramedian approach.   The epidural space was localized using loss of resistance along with counter oblique bi-planar fluoroscopic views.  After negative aspirate for air, blood, and CSF, a 2 ml. volume of Isovue-250 was injected into the epidural space and the flow of contrast was observed. Radiographs were obtained for documentation purposes.    The injectate was administered into the level noted above.   Additional Comments:  The patient tolerated the procedure well Dressing: 2 x 2 sterile gauze and Band-Aid    Post-procedure details: Patient was observed during the procedure. Post-procedure instructions were reviewed.  Patient left the  clinic in stable condition.

## 2020-12-24 NOTE — Progress Notes (Signed)
Barbara Patton - 80 y.o. female MRN [id number]  Date of birth: 04/24/1941  Office Visit Note: Visit Date: 12/23/2020 PCP: Seward Carol, MD Referred by: Seward Carol, MD  Subjective: Chief Complaint  Patient presents with   Lower Back - Pain   HPI:  Barbara Patton is a 80 y.o. female who comes in today at the request of Barnet Pall, FNP for planned Left L4-L5 Lumbar Interlaminar epidural steroid injection with fluoroscopic guidance.  The patient has failed conservative care including home exercise, medications, time and activity modification.  This injection will be diagnostic and hopefully therapeutic.  Please see requesting physician notes for further details and justification. MRI reviewed with images and spine model.  MRI reviewed in the note below.     ROS Otherwise per HPI.  Assessment & Plan: Visit Diagnoses:    ICD-10-CM   1. Lumbar radiculopathy  M54.16 XR C-ARM NO REPORT    Epidural Steroid injection    betamethasone acetate-betamethasone sodium phosphate (CELESTONE) injection 12 mg    2. Spinal stenosis of lumbar region with neurogenic claudication  M48.062 XR C-ARM NO REPORT    Epidural Steroid injection    betamethasone acetate-betamethasone sodium phosphate (CELESTONE) injection 12 mg      Plan: No additional findings.   Meds & Orders:  Meds ordered this encounter  Medications   betamethasone acetate-betamethasone sodium phosphate (CELESTONE) injection 12 mg    Orders Placed This Encounter  Procedures   XR C-ARM NO REPORT   Epidural Steroid injection    Follow-up: No follow-ups on file.   Procedures: No procedures performed  Lumbar Epidural Steroid Injection - Interlaminar Approach with Fluoroscopic Guidance  Patient: Barbara Patton      Date of Birth: [birth year] MRN: [id number] PCP: Seward Carol, MD      Visit Date: 12/23/2020   Universal Protocol:     Consent Given By: the patient  Position: PRONE  Additional Comments: Vital  signs were monitored before and after the procedure. Patient was prepped and draped in the usual sterile fashion. The correct patient, procedure, and site was verified.   Injection Procedure Details:   Procedure diagnoses: Lumbar radiculopathy [M54.16]   Meds Administered:  Meds ordered this encounter  Medications   betamethasone acetate-betamethasone sodium phosphate (CELESTONE) injection 12 mg     Laterality: Left  Location/Site:  L4-L5  Needle: 3.5 in., 20 ga. Tuohy  Needle Placement: Paramedian epidural  Findings:   -Comments: Excellent flow of contrast into the epidural space.  Procedure Details: Using a paramedian approach from the side mentioned above, the region overlying the inferior lamina was localized under fluoroscopic visualization and the soft tissues overlying this structure were infiltrated with 4 ml. of 1% Lidocaine without Epinephrine. The Tuohy needle was inserted into the epidural space using a paramedian approach.   The epidural space was localized using loss of resistance along with counter oblique bi-planar fluoroscopic views.  After negative aspirate for air, blood, and CSF, a 2 ml. volume of Isovue-250 was injected into the epidural space and the flow of contrast was observed. Radiographs were obtained for documentation purposes.    The injectate was administered into the level noted above.   Additional Comments:  The patient tolerated the procedure well Dressing: 2 x 2 sterile gauze and Band-Aid    Post-procedure details: Patient was observed during the procedure. Post-procedure instructions were reviewed.  Patient left the clinic in stable condition.   Clinical History: CLINICAL DATA:  Chronic low back pain with  left leg pain for several years   EXAM: MRI LUMBAR SPINE WITHOUT CONTRAST   TECHNIQUE: Multiplanar, multisequence MR imaging of the lumbar spine was performed. No intravenous contrast was administered.   COMPARISON:   10/04/2009   FINDINGS: Segmentation:  Standard lumbar numbering   Alignment:  Mild anterolisthesis at L3-4 and L4-5.   Vertebrae:  No fracture, evidence of discitis, or bone lesion.   Conus medullaris and cauda equina: Conus extends to the L1-2 level. Conus and cauda equina appear normal.   Paraspinal and other soft tissues: Distended bladder which is trabeculated. Prominent atrophy of intrinsic back muscles. Partially covered sigmoid diverticulosis.   Disc levels:   T12- L1: Minor spondylosis and annulus bulging   L1-L2: Unremarkable for age   L2-L3: Mild facet spurring and annulus bulging.   L3-L4: Prominent facet osteoarthritis with spurring and effusions in the gaping joint spaces. Mild anterolisthesis. Mild disc narrowing and bulging. There is moderate spinal stenosis but no static nerve root compression.   L4-L5: Facet osteoarthritis with spurring and mild anterolisthesis. Mild disc bulging. No neural compression   L5-S1:Mild facet spurring and disc bulging.   IMPRESSION: 1. 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 2. 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 3. 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     Electronically Signed   By: Monte Fantasia M.D.   On: 12/05/2019 05:54.     Objective:  VS:  HT:    WT:   BMI:     BP:(!) 146/76  HR:88bpm  TEMP: ( )  RESP:  Physical Exam Vitals and nursing note reviewed.  Constitutional:      General: She is not in acute distress.    Appearance: Normal appearance. She is not ill-appearing.  HENT:     Head: Normocephalic and atraumatic.     Right Ear: External ear normal.     Left Ear: External ear normal.  Eyes:     Extraocular Movements: Extraocular movements intact.  Cardiovascular:     Rate and Rhythm: Normal rate.     Pulses: Normal pulses.  Pulmonary:     Effort:  Pulmonary effort is normal. No respiratory distress.  Abdominal:     General: There is no distension.     Palpations: Abdomen is soft.  Musculoskeletal:        General: Tenderness present.     Cervical back: Neck supple.     Right lower leg: No edema.     Left lower leg: No edema.     Comments: Patient has good distal strength with no pain over the greater trochanters.  No clonus or focal weakness.  Skin:    Findings: No erythema, lesion or rash.  Neurological:     General: No focal deficit present.     Mental Status: She is alert and oriented to person, place, and time.     Sensory: No sensory deficit.     Motor: No weakness or abnormal muscle tone.     Coordination: Coordination normal.  Psychiatric:        Mood and Affect: Mood normal.        Behavior: Behavior normal.     Imaging: XR C-ARM NO REPORT  Result Date: 12/23/2020 Please see Notes tab for imaging impression.

## 2020-12-27 ENCOUNTER — Other Ambulatory Visit: Payer: Self-pay

## 2020-12-27 ENCOUNTER — Encounter: Payer: Self-pay | Admitting: Physical Therapy

## 2020-12-27 ENCOUNTER — Ambulatory Visit (INDEPENDENT_AMBULATORY_CARE_PROVIDER_SITE_OTHER): Payer: Medicare HMO | Admitting: Physical Therapy

## 2020-12-27 DIAGNOSIS — R262 Difficulty in walking, not elsewhere classified: Secondary | ICD-10-CM

## 2020-12-27 DIAGNOSIS — M545 Low back pain, unspecified: Secondary | ICD-10-CM | POA: Diagnosis not present

## 2020-12-27 DIAGNOSIS — M6281 Muscle weakness (generalized): Secondary | ICD-10-CM | POA: Diagnosis not present

## 2020-12-27 DIAGNOSIS — G8929 Other chronic pain: Secondary | ICD-10-CM | POA: Diagnosis not present

## 2020-12-27 NOTE — Addendum Note (Signed)
Addended by: Debbe Odea on: 12/27/2020 03:51 PM   Modules accepted: Orders

## 2020-12-27 NOTE — Therapy (Addendum)
[facility] Physical Therapy [address] [place], [place], [address] Phone: [phone]   Fax:  [phone]  Physical Therapy Evaluation Referring diagnosis? M54.16 Treatment diagnosis? (if different than referring diagnosis) m54.5 What was this (referring dx) caused by? '[]'$  Surgery '[]'$  Fall '[x]'$  Ongoing issue '[x]'$  Arthritis '[]'$  Other: ____________  Laterality: '[]'$  Rt '[x]'$  Lt '[]'$  Both  Check all possible CPT codes:      '[x]'$  97110 (Therapeutic Exercise)  '[]'$  92507 (SLP Treatment)  '[x]'$  97112 (Neuro Re-ed)   '[]'$  92526 (Swallowing Treatment)   '[]'$  97116 (Gait Training)   '[]'$  [phone] (Cognitive Training, 1st 15 minutes) '[x]'$  97140 (Manual Therapy)   '[]'$  97130 (Cognitive Training, each add'l 15 minutes)  '[x]'$  97530 (Therapeutic Activities)  '[]'$  Other, List CPT Code ____________    '[x]'$  [id number] (Self Care)       '[]'$  All codes above (97110 - 97535)  '[x]'$  97012 (Mechanical Traction)  '[x]'$  97014 (E-stim Unattended)  '[]'$  97032 (E-stim manual)  '[x]'$  97033 (Ionto)  '[x]'$  97035 (Ultrasound)  '[]'$  97760 (Orthotic Fit) '[]'$  97750 (Physical Performance Training) '[]'$  [id number] (Aquatic Therapy) '[]'$  97034 (Contrast Bath) '[]'$  97018 (Paraffin) '[]'$  97597 (Wound Care 1st 20 sq cm) '[]'$  97598 (Wound Care each add'l 20 sq cm) '[]'$  40981 (Vasopneumatic Device) '[]'$  [phone] (Orthotic Training) '[]'$  [id number] (Prosthetic Training)  Patient Details  Name: Barbara Patton MRN: [id number] Date of Birth: [birth year] Referring Provider (PT): Lorine Bears, NP   Encounter Date: 12/27/2020   PT End of Session - 12/27/20 1535     Visit Number 1    Number of Visits 15    Date for PT Re-Evaluation 02/21/21    Authorization Type [employer] MCR    PT Start Time 1436    PT Stop Time 1515    PT Time Calculation (min) 39 min    Activity Tolerance Patient tolerated treatment well    Behavior During Therapy WFL for tasks assessed/performed             Past Medical History:  Diagnosis Date   High cholesterol    Hypertension     Thyroid disease     Past Surgical History:  Procedure Laterality Date   JOINT REPLACEMENT      There were no vitals filed for this visit.    Subjective Assessment - 12/27/20 1433     Subjective Relays chronic LBP, she has fibromyalgia and OA and lupis. Her pain is mosly left sided on her low back and groin and hip bursa.  She did have injection last week which helped. MD recommending manual therapy and DN to bilateral flank and also a HEP.    Pertinent History PMH: fibromyalgia,HTN,Lt TKA,    Limitations Lifting;Standing;Walking;House hold activities;Sitting    How long can you stand comfortably? maybe 20 min    Diagnostic tests Patient's lumbar MRI from 2021 "exhibits facet osteoarthritis at L3-L4 and L4-L5 with moderate spinal stenosis at L3-L4. "    Currently in Pain? Yes    Pain Location Back    Pain Orientation Left    Pain Descriptors / Indicators Aching    Pain Type Chronic pain    Pain Radiating Towards denies N/T but does have some pain referred into her buttocks    Pain Onset More than a month ago    Pain Frequency Constant    Aggravating Factors  prolonged standing, mostly a continous pain    Pain Relieving Factors CBD, sometimes leaning on something into flexion  [facility] PT Assessment - 12/27/20 0001       Assessment   Medical Diagnosis Chronic low back pain    Referring Provider (PT) Lorine Bears, NP    Onset Date/Surgical Date --   chronic pain   Next MD Visit nothing sch.      Balance Screen   Has the patient fallen in the past 6 months No    Has the patient had a decrease in activity level because of a fear of falling?  No    Is the patient reluctant to leave their home because of a fear of falling?  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      Cognition   Overall  Cognitive Status Within Functional Limits for tasks assessed      Observation/Other Assessments   Focus on Therapeutic Outcomes (FOTO)  61% functional, goal 66%      Posture/Postural Control   Posture Comments Rt lateral shift      ROM / Strength   AROM / PROM / Strength AROM;Strength      AROM   Overall AROM Comments lumbar ROM 75% all planes      Strength   Overall Strength Comments Left leg overall 4/5 MMT, Rt leg overall 4+      Palpation   Palpation comment TTP lumbar paraspinals and glutes      Special Tests   Other special tests no change with lateral shift correction 10 reps at wall, negative slump test, negative SLR test      Ambulation/Gait   Gait Comments independent community ambultor, overall decreased velocity                        Objective measurements completed on examination: See above findings.       [place] Adult PT Treatment/Exercise - 12/27/20 0001       Manual Therapy   Manual therapy comments STM/IASTM with percussion gun 10 min to lumbar P.S and glutes bilat but more time spent on left                    PT Education - 12/27/20 1534     Education Details HEP,POC    Person(s) Educated Patient    Methods Explanation;Demonstration;Verbal cues;Handout    Comprehension Verbalized understanding;Need further instruction              PT Short Term Goals - 12/27/20 1540       PT SHORT TERM GOAL #1   Title Pt will be I and compliant with intiial HEP.    Time 4    Period Weeks    Status New    Target Date 01/24/21      PT SHORT TERM GOAL #2   Title Pt will improve pain by overall 25%    Time 4    Period Weeks    Status New               PT Long Term Goals - 12/27/20 1540       PT LONG TERM GOAL #1   Title Pt will improve pain by overall 50%    Time 8    Period Weeks    Status New    Target Date 02/21/21  PT LONG TERM GOAL #2   Title Pt will improve FOTO to at least 66% functional score     Time 8    Period Weeks    Status New      PT LONG TERM GOAL #3   Title Pt will improve left hip/knee strength to 4+ tested in sitting to improve function.    Time 8    Period Weeks    Status New                    Plan - 12/27/20 1536     Clinical Impression Statement Pt presents with chronic LBP with referred pain into her left buttock. She did appear to benefit from manual therapy today for STM/IASTM suggesting some pain is muscular and myofascal in nature. She will benefit from skilled PT to address her funcitonal deficits listed below.    Personal Factors and Comorbidities Comorbidity 3+    Comorbidities PMH: fibromyalgia,HTN,Lt TKA,    Examination-Activity Limitations Bend;Bed Mobility;Carry;Lift;Stand;Squat;Stairs;Sleep;Locomotion Level;Transfers    Examination-Participation Restrictions Cleaning;Driving;Other    Stability/Clinical Decision Making Evolving/Moderate complexity    Clinical Decision Making Moderate    Rehab Potential Good    PT Frequency 2x / week   1-2   PT Duration 8 weeks    PT Treatment/Interventions Cryotherapy;Electrical Stimulation;Iontophoresis '4mg'$ /ml Dexamethasone;ADLs/Self Care Home Management;Moist Heat;Traction;Ultrasound;Therapeutic activities;Therapeutic exercise;Balance training;Neuromuscular re-education;Manual techniques;Passive range of motion;Dry needling;Spinal Manipulations;Taping    PT Next Visit Plan review and update HEp PRN, consider manual therapy for pain control, needs lumbar mobility and strength.    PT Home Exercise Plan Signed   Access Code: [email or web address] and Agree with Plan of Care Patient             Patient will benefit from skilled therapeutic intervention in order to improve the following deficits and impairments:  Decreased activity tolerance, Decreased endurance, Decreased range of motion, Decreased strength, Difficulty walking, Increased muscle spasms, Impaired flexibility, Postural dysfunction,  Pain  Visit Diagnosis: Chronic left-sided low back pain, unspecified whether sciatica present  Muscle weakness (generalized)  Difficulty in walking, not elsewhere classified     Problem List 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 [place], [place], [address] Phone: [phone]   Fax:  [phone]  Name: MALLIE BARISH MRN: [id number] Date of Birth: 29-Sep-1940

## 2020-12-27 NOTE — Patient Instructions (Signed)
Access Code: [email or web address] URL: [email or web address] Date: 12/27/2020 Prepared by: Elsie Ra  Exercises Right Standing Lateral Shift Correction at [place] - 2 x daily - 6 x weekly - 3 sets - 10 reps Seated Hamstring Stretch - 2 x daily - 6 x weekly - 1 sets - 3 reps - 30 hold Supine Lower Trunk Rotation - 2 x daily - 6 x weekly - 1 sets - 10 reps - 5 sec hold Supine Piriformis Stretch with Foot on Ground - 2 x daily - 6 x weekly - 3 sets - 30 hold Supine Bridge - 2 x daily - 6 x weekly - 1-2 sets - 10 reps - 5 hold Standing Row with Anchored Resistance - 2 x daily - 6 x weekly - 2-3 sets - 10-20 reps Shoulder extension with resistance - Neutral - 2 x daily - 6 x weekly - 2-3 sets - 10 reps

## 2020-12-28 DIAGNOSIS — E785 Hyperlipidemia, unspecified: Secondary | ICD-10-CM | POA: Diagnosis not present

## 2020-12-28 DIAGNOSIS — E7800 Pure hypercholesterolemia, unspecified: Secondary | ICD-10-CM | POA: Diagnosis not present

## 2020-12-28 DIAGNOSIS — K219 Gastro-esophageal reflux disease without esophagitis: Secondary | ICD-10-CM | POA: Diagnosis not present

## 2020-12-28 DIAGNOSIS — I10 Essential (primary) hypertension: Secondary | ICD-10-CM | POA: Diagnosis not present

## 2020-12-28 DIAGNOSIS — E039 Hypothyroidism, unspecified: Secondary | ICD-10-CM | POA: Diagnosis not present

## 2021-01-04 ENCOUNTER — Encounter: Payer: Self-pay | Admitting: Rehabilitative and Restorative Service Providers"

## 2021-01-04 ENCOUNTER — Other Ambulatory Visit: Payer: Self-pay

## 2021-01-04 ENCOUNTER — Ambulatory Visit (INDEPENDENT_AMBULATORY_CARE_PROVIDER_SITE_OTHER): Payer: Medicare HMO | Admitting: Rehabilitative and Restorative Service Providers"

## 2021-01-04 DIAGNOSIS — M545 Low back pain, unspecified: Secondary | ICD-10-CM | POA: Diagnosis not present

## 2021-01-04 DIAGNOSIS — R262 Difficulty in walking, not elsewhere classified: Secondary | ICD-10-CM | POA: Diagnosis not present

## 2021-01-04 DIAGNOSIS — G8929 Other chronic pain: Secondary | ICD-10-CM

## 2021-01-04 DIAGNOSIS — M6281 Muscle weakness (generalized): Secondary | ICD-10-CM

## 2021-01-04 NOTE — Progress Notes (Signed)
Assessment/Plan:   1.  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  Tremor is actually very mild and only noticeable when she is trying to separate pages of a book or newspapers.  Discussed medicines with patient, but told her that I think that risks outweigh benefits in her case.  Told her we could prescribe them if she would like to, but would not recommend them right now.  She was in agreement.  Discussed that if tremor becomes more bothersome or interferes with ADLs, I would like to see her back.  -Reassured her I saw no evidence of Parkinson's disease.  Subjective:   Barbara Patton was seen today in the movement disorders clinic for neurologic consultation at the request of Johna Roles, [place].  The consultation is for the evaluation of "intention tremor."  Tremor: Yes.     How long has it been going on? 6 months  At rest or with activation?  Activation only  When is it noted the most?  Picking up objects, turning pages in a book  Fam hx of tremor?  ? Mother with tremor  Located where?  Bilateral UE - she is R handed  Affected by caffeine:  doesn't drink caffeine  Affected by alcohol:  No. (drinks wine 3 days per week)  Affected by stress:  No.  Affected by fatigue:  No.  Spills soup if on spoon:  No.  Spills glass of liquid if full:  No. (Doesn't tremor if holding something )  Affects ADL's (tying shoes, brushing teeth, etc):  No.  Tremor inducing meds:  No.  Other Specific Symptoms:  Voice: no change Sleep: sleeps well Postural symptoms:  No.  Falls?  No. Bradykinesia symptoms: difficulty getting out of a chair (pushes off) Loss of smell:  No. Loss of taste:  No. Urinary Incontinence:  No. Difficulty Swallowing:  No. Memory changes:  mild, similar to friends her age. N/V:  No. Lightheaded:  No.  Syncope: No. Diplopia:  No.   Neuroimaging of the brain has not previously been performed.    PREVIOUS MEDICATIONS: none to date  ALLERGIES:    Allergies  Allergen Reactions   Codeine Other (See Comments)    Kept awake   Penicillins Hives    Did it involve swelling of the face/tongue/throat, SOB, or low BP? N Did it involve sudden or severe rash/hives, skin peeling, or any reaction on the inside of your mouth or nose? Y Did you need to seek medical attention at a hospital or doctor's office? N When did it last happen?   Over 10 Years Ago    If all above answers are "NO", may proceed with cephalosporin use.     CURRENT MEDICATIONS:  Current Outpatient Medications  Medication Instructions   acetaminophen (TYLENOL) 650 mg, 2 times daily PRN   Ascorbic Acid (VITAMIN C PO) 1 tablet, Oral, Daily   betamethasone dipropionate (DIPROLENE) 0.05 % ointment 1 application, Topical, 2 times daily PRN   Cholecalciferol (VITAMIN D-3 PO) 1 tablet, Daily   Coenzyme Q10 (CO Q 10 PO) 1 tablet, Daily   diclofenac Sodium (VOLTAREN) 1 % GEL Add'l Sig Add'l Sig topical Add'l Sig   DULoxetine (CYMBALTA) 30 MG capsule No dose, route, or frequency recorded.   furosemide (LASIX) 40 mg, Oral, Daily   hydrochlorothiazide (HYDRODIURIL) 25 mg, Daily   hydroxychloroquine (PLAQUENIL) 200 mg, Oral, Daily   hydrOXYzine (ATARAX/VISTARIL) 10 mg, Oral, Every 6 hours PRN   ibuprofen (ADVIL,MOTRIN) 600 MG tablet TAKE  1 TABLET BY MOUTH THREE TIMES DAILY WITH MEALS FOR 14 DAYS   levothyroxine (SYNTHROID) 112 mcg, Oral, Daily   levothyroxine (SYNTHROID) 100 mcg, Oral, Daily   methocarbamol (ROBAXIN) 500 mg, Oral, 2 times daily PRN   methylPREDNISolone (MEDROL) 4 MG tablet 6 Day taper, take as directed   Multiple Vitamin (MULTI-VITAMINS) TABS 1 tablet, Oral, Daily   Multiple Vitamins-Minerals (MULTIVITAMIN PO) 1 tablet, Oral, Daily   Omega-3 Fatty Acids (FISH OIL PO) 1 capsule, Oral, Weekly   omeprazole (PRILOSEC) 40 mg, Oral, Daily   oxybutynin (DITROPAN) 5 mg, Oral, 3 times daily   phenazopyridine (PYRIDIUM) 200 mg, Oral, 3 times daily   simvastatin (ZOCOR) 20  mg, Oral, Every evening   timolol (TIMOPTIC) 0.5 % ophthalmic solution 1 drop, 2 times daily   tolterodine (DETROL LA) 4 MG 24 hr capsule TAKE 1 CAPSULE BY MOUTH ONCE DAILY FOR 30 DAYS   traMADol (ULTRAM) 50 mg, 2 times daily PRN    Objective:   PHYSICAL EXAMINATION:    VITALS:   Vitals:   01/06/21 0831  BP: 130/86  Pulse: 80  Resp: 18  SpO2: 95%  Weight: 197 lb (89.4 kg)  Height: '5\' 6"'$  (1.676 m)    GEN:  The patient appears stated age and is in NAD. HEENT:  Normocephalic, atraumatic.  The mucous membranes are moist. The superficial temporal arteries are without ropiness or tenderness. CV:  RRR Lungs:  CTAB Neck/HEME:  There are no carotid bruits bilaterally.  Neurological examination:  Orientation: The patient is alert and oriented x3.  Cranial nerves: There is good facial symmetry.  Extraocular muscles are intact. The visual fields are full to confrontational testing. The speech is fluent and clear. Soft palate rises symmetrically and there is no tongue deviation. Hearing is intact to conversational tone. Sensation: Sensation is intact to light touch throughout (facial, trunk, extremities). Vibration is absent at the bilateral big toe and ankle but intact at the knee. There is no extinction with double simultaneous stimulation.  Motor: Strength is 5/5 in the bilateral upper and lower extremities.   Shoulder shrug is equal and symmetric.  There is no pronator drift. Deep tendon reflexes: Deep tendon reflexes are 1/4 at the bilateral biceps, triceps, brachioradialis.  She did not allow for testing at the patella because of pain at the knees (did try prepatellar reflexes but they were not there).  Movement examination: Tone: There is normal tone in the bilateral upper extremities.  The tone in the lower extremities is normal.  Abnormal movements: There is no rest tremor, even with distraction procedures.  No postural tremor.  She has very little intention tremor, mostly with  finger-to-nose.  She does not have intention tremor with approximating her index fingers.  She has no trouble with Archimedes spirals.  She has no trouble with pouring water from 1 glass to another.  She has mild tremor when she was trying to separate pages of a pamphlet. Coordination:  There is no decremation with RAM's, with any form of RAMS, including alternating supination and pronation of the forearm, hand opening and closing, finger taps, heel taps and toe taps Gait and Station:Patient pushes off of the chair to arise. The patient is wide-based. I have reviewed and interpreted the following labs independently   Chemistry      Component Value Date/Time   NA 140 01/27/2014 1014   K 3.8 01/27/2014 1014   CL 102 01/27/2014 1014   CO2 29 01/27/2014 1014   BUN 15  01/27/2014 1014   CREATININE 0.73 01/27/2014 1014      Component Value Date/Time   CALCIUM 9.2 01/27/2014 1014   ALKPHOS 95 01/27/2014 1014   AST 28 01/27/2014 1014   ALT 37 (H) 01/27/2014 1014   BILITOT 0.5 01/27/2014 1014      No results found for: TSH Lab Results  Component Value Date   WBC 4.0 01/27/2014   HGB 12.2 01/27/2014   HCT 37.7 01/27/2014   MCV 97.4 01/27/2014   PLT 197 01/27/2014    Lab work done December 16, 2020 by primary care.  Potassium was 4.2, chloride 102, CO2 34, BUN 25, creatinine 0.76.  White blood cells 4.9, hemoglobin 12.2, hematocrit 36.8 and platelets 187.  TSH was 1.64.  Total time spent on today's visit was 45 minutes, including both face-to-face time and nonface-to-face time.  Time included that spent on review of records (prior notes available to me/labs/imaging if pertinent), discussing treatment and goals, answering patient's questions and coordinating care.  Cc:  Seward Carol, MD

## 2021-01-04 NOTE — Therapy (Signed)
[facility] [address] [place], [place], [address] Phone: [phone]   Fax:  [phone]  Physical Therapy Treatment  Patient Details  Name: Barbara Patton MRN: [id number] Date of Birth: 06/16/1940 Referring Provider (PT): Lorine Bears, NP   Encounter Date: 01/04/2021   PT End of Session - 01/04/21 [id number]     Visit Number 2    Number of Visits 15    Date for PT Re-Evaluation 02/21/21    Authorization Type [employer] MCR    Authorization Time Period to 02/21/2021    Authorization - Visit Number 2    Authorization - Number of Visits 12    Progress Note Due on Visit 10    PT Start Time 0927    PT Stop Time 1005    PT Time Calculation (min) 38 min    Activity Tolerance Patient tolerated treatment well    Behavior During Therapy [facility] for tasks assessed/performed             Past Medical History:  Diagnosis Date   High cholesterol    Hypertension    Thyroid disease     Past Surgical History:  Procedure Laterality Date   JOINT REPLACEMENT      There were no vitals filed for this visit.   Subjective Assessment - 01/04/21 0932     Subjective Pt. indicated feeling pain increase and constant since last visit.   Pt. indicated she "did a few" of the exercises but not doing them everything.    Pertinent History PMH: fibromyalgia,HTN,Lt TKA,    Limitations Lifting;Standing;Walking;House hold activities;Sitting    How long can you stand comfortably? maybe 20 min    Diagnostic tests Patient's lumbar MRI from 2021 "exhibits facet osteoarthritis at L3-L4 and L4-L5 with moderate spinal stenosis at L3-L4. "    Currently in Pain? Yes    Pain Score 5    10 this morning   Pain Location Back    Pain Descriptors / Indicators Aching;Constant    Pain Type Chronic pain    Pain Onset More than a month ago    Pain Frequency Constant    Aggravating Factors  constant pain    Pain Relieving Factors CBD oil helped some                                [facility] Adult PT Treatment/Exercise - 01/04/21 0001       Self-Care   Self-Care Other Self-Care Comments    Other Self-Care Comments  Post manual intervention soreness management strategies including heat/ice application, stretching in HEP.  Detailed feelings and normal responses.  handout provided.      Exercises   Exercises Other Exercises;Lumbar      Lumbar Exercises: Stretches   Lower Trunk Rotation 5 reps   15 sec x 5 bilateral     Lumbar Exercises: Supine   Other Supine Lumbar Exercises supine glute set 5 sec hold x 10 (pain c bridge attempt)      Manual Therapy   Manual therapy comments palpation during DN.  G3 cPA L3-L5 in prone              Trigger Point Dry Needling - 01/04/21 0001     Consent Given? Yes    Education Handout Provided Yes    Muscles Treated Back/Hip Quadratus lumborum   Lt  PT Education - 01/04/21 1004     Education Details Dry needling    Person(s) Educated Patient    Methods Explanation;Handout;Verbal cues    Comprehension Verbalized understanding              PT Short Term Goals - 01/04/21 0934       PT SHORT TERM GOAL #1   Title Pt will be I and compliant with intiial HEP.    Time 4    Period Weeks    Status On-going    Target Date 01/24/21      PT SHORT TERM GOAL #2   Title Pt will improve pain by overall 25%    Time 4    Period Weeks    Status On-going    Target Date 01/24/21               PT Long Term Goals - 12/27/20 1540       PT LONG TERM GOAL #1   Title Pt will improve pain by overall 50%    Time 8    Period Weeks    Status New    Target Date 02/21/21      PT LONG TERM GOAL #2   Title Pt will improve FOTO to at least 66% functional score    Time 8    Period Weeks    Status New      PT LONG TERM GOAL #3   Title Pt will improve left hip/knee strength to 4+ tested in sitting to improve function.    Time 8    Period Weeks    Status New                    Plan - 01/04/21 0956     Clinical Impression Statement Concordant localized symptoms noted in Lt lumbar c trigger point palpation and twitch response.  PAIVM showed restriction L4, L5 cPA c concordant central and Lt lumbar pain noted in assessment.  Continued skilled PT services indicated to progress mobility, improve symptoms for functional activity.    Personal Factors and Comorbidities Comorbidity 3+    Comorbidities PMH: fibromyalgia,HTN,Lt TKA,    Examination-Activity Limitations Bend;Bed Mobility;Carry;Lift;Stand;Squat;Stairs;Sleep;Locomotion Level;Transfers    Examination-Participation Restrictions Cleaning;Driving;Other    Stability/Clinical Decision Making Evolving/Moderate complexity    Rehab Potential Good    PT Frequency 2x / week   1-2   PT Duration 8 weeks    PT Treatment/Interventions Cryotherapy;Electrical Stimulation;Iontophoresis '4mg'$ /ml Dexamethasone;ADLs/Self Care Home Management;Moist Heat;Traction;Ultrasound;Therapeutic activities;Therapeutic exercise;Balance training;Neuromuscular re-education;Manual techniques;Passive range of motion;Dry needling;Spinal Manipulations;Taping    PT Next Visit Plan DN if desired, improve L4, L5 mobility    PT Home Exercise Plan Signed   Access Code: [email or web address]    Consulted and Agree with Plan of Care Patient             Patient will benefit from skilled therapeutic intervention in order to improve the following deficits and impairments:  Decreased activity tolerance, Decreased endurance, Decreased range of motion, Decreased strength, Difficulty walking, Increased muscle spasms, Impaired flexibility, Postural dysfunction, Pain  Visit Diagnosis: Chronic left-sided low back pain, unspecified whether sciatica present  Muscle weakness (generalized)  Difficulty in walking, not elsewhere classified     Problem List Patient Active Problem List   Diagnosis Date Noted   Bilateral sensorineural hearing loss  08/23/2015   Referred otalgia of left ear 08/23/2015   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   Allergic rhinitis 07/26/2015   Anxiety 07/26/2015   Benign essential  hypertension 07/26/2015   Dysfunction of left eustachian tube 07/26/2015   Esophageal reflux 07/26/2015   Fibromyalgia 07/26/2015   Pure hypercholesterolemia 07/26/2015   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 05/03/2014   Pes anserinus bursitis of left knee 12/04/2013   Trochanteric bursitis of left hip 08/07/2013   Leg swelling 12/06/2011   Glaucoma 11/02/2011   HLD (hyperlipidemia) 11/02/2011   HTN (hypertension) 11/02/2011   Hypothyroid 11/02/2011   OA (osteoarthritis) 11/02/2011   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 ([place]) 03/16/2011   Presence of artificial knee joint 03/16/2011    Scot Jun, PT, DPT, OCS, ATC 01/04/21  10:05 AM    [facility] [address] [place], [place], [address] Phone: [phone]   Fax:  [phone]  Name: SHONDELL MANDATO MRN: [id number] Date of Birth: 08-05-1940

## 2021-01-06 ENCOUNTER — Encounter: Payer: Self-pay | Admitting: Neurology

## 2021-01-06 ENCOUNTER — Ambulatory Visit (INDEPENDENT_AMBULATORY_CARE_PROVIDER_SITE_OTHER): Payer: Medicare HMO | Admitting: Neurology

## 2021-01-06 ENCOUNTER — Other Ambulatory Visit: Payer: Self-pay

## 2021-01-06 VITALS — BP 130/86 | HR 80 | Resp 18 | Ht 66.0 in | Wt 197.0 lb

## 2021-01-06 DIAGNOSIS — G250 Essential tremor: Secondary | ICD-10-CM

## 2021-01-06 NOTE — Patient Instructions (Signed)
Essential Tremor A tremor is trembling or shaking that a person cannot control. Most tremors affect the hands or arms. Tremors can also affect the head, vocal cords, legs, and other parts of the body. Essential tremor is a tremor without a known cause. Usually, it occurs while a person is trying to perform an action. Ittends to get worse gradually as a person ages. What are the causes? The cause of this condition is not known. What increases the risk? You are more likely to develop this condition if: You have a family member with essential tremor. You are age 40 or older. You take certain medicines. What are the signs or symptoms? The main sign of a tremor is a rhythmic shaking of certain parts of your body that is uncontrolled and unintentional. You may: Have difficulty eating with a spoon or fork. Have difficulty writing. Nod your head up and down or side to side. Have a quivering voice. The shaking may: Get worse over time. Come and go. Be more noticeable on one side of your body. Get worse due to stress, fatigue, caffeine, and extreme heat or cold. How is this diagnosed? This condition may be diagnosed based on: Your symptoms and medical history. A physical exam. There is no single test to diagnose an essential tremor. However, your health care provider may order tests to rule out other causes of your condition. These may include: Blood and urine tests. Imaging studies of your brain, such as CT scan and MRI. A test that measures involuntary muscle movement (electromyogram). How is this treated? Treatment for essential tremor depends on the severity of the condition. Some tremors may go away without treatment. Mild tremors may not need treatment if they do not affect your day-to-day life. Severe tremors may need to be treated using one or more of the following options: Medicines. Lifestyle changes. Occupational or physical therapy. Follow these instructions at  home: Lifestyle  Do not use any products that contain nicotine or tobacco, such as cigarettes and e-cigarettes. If you need help quitting, ask your health care provider. Limit your caffeine intake as told by your health care provider. Try to get 8 hours of sleep each night. Find ways to manage your stress that fits your lifestyle and personality. Consider trying meditation or yoga. Try to anticipate stressful situations and allow extra time to manage them. If you are struggling emotionally with the effects of your tremor, consider working with a mental health provider.  General instructions Take over-the-counter and prescription medicines only as told by your health care provider. Avoid extreme heat and extreme cold. Keep all follow-up visits as told by your health care provider. This is important. Visits may include physical therapy visits. Contact a health care provider if: You experience any changes in the location or intensity of your tremors. You start having a tremor after starting a new medicine. You have tremor with other symptoms, such as: Numbness. Tingling. Pain. Weakness. Your tremor gets worse. Your tremor interferes with your daily life. You feel down, blue, or sad for at least 2 weeks in a row. Worrying about your tremor and what other people think about you interferes with your everyday life functions, including relationships, work, or school. Summary Essential tremor is a tremor without a known cause. Usually, it occurs when you are trying to perform an action. You are more likely to develop this condition if you have a family member with essential tremor. The main sign of a tremor is a rhythmic shaking of   certain parts of your body that is uncontrolled and unintentional. Treatment for essential tremor depends on the severity of the condition. This information is not intended to replace advice given to you by your health care provider. Make sure you discuss any  questions you have with your healthcare provider. Document Revised: 02/13/2020 Document Reviewed: 02/13/2020 Elsevier Patient Education  2022 Elsevier Inc.  

## 2021-01-11 ENCOUNTER — Ambulatory Visit (INDEPENDENT_AMBULATORY_CARE_PROVIDER_SITE_OTHER): Payer: Medicare HMO | Admitting: Physical Therapy

## 2021-01-11 ENCOUNTER — Other Ambulatory Visit: Payer: Self-pay

## 2021-01-11 DIAGNOSIS — R262 Difficulty in walking, not elsewhere classified: Secondary | ICD-10-CM

## 2021-01-11 DIAGNOSIS — M545 Low back pain, unspecified: Secondary | ICD-10-CM

## 2021-01-11 DIAGNOSIS — G8929 Other chronic pain: Secondary | ICD-10-CM

## 2021-01-11 DIAGNOSIS — M6281 Muscle weakness (generalized): Secondary | ICD-10-CM | POA: Diagnosis not present

## 2021-01-11 NOTE — Therapy (Signed)
[facility] [address] [place], [place], [address] Phone: [phone]   Fax:  [phone]  Physical Therapy Treatment  Patient Details  Name: Barbara Patton MRN: [id number] Date of Birth: March 04, 1941 Referring Provider (PT): Lorine Bears, NP   Encounter Date: 01/11/2021   PT End of Session - 01/11/21 1351     Visit Number 3    Number of Visits 15    Date for PT Re-Evaluation 02/21/21    Authorization Type [employer] MCR    Authorization Time Period to 02/21/2021    Authorization - Visit Number 3    Authorization - Number of Visits 12    Progress Note Due on Visit 10    PT Start Time 1300    PT Stop Time 1342    PT Time Calculation (min) 42 min    Activity Tolerance Patient tolerated treatment well    Behavior During Therapy [facility] for tasks assessed/performed             Past Medical History:  Diagnosis Date   Glaucoma    High cholesterol    Hypertension    Lupus ([place])    Thyroid disease     Past Surgical History:  Procedure Laterality Date   ABDOMINAL HYSTERECTOMY     CATARACT EXTRACTION, BILATERAL     TOTAL KNEE ARTHROPLASTY Bilateral    TUBAL LIGATION      There were no vitals filed for this visit.   Subjective Assessment - 01/11/21 1326     Subjective Pt. relays the DN helped more than the massage gun but the pain came back after 2 days. She states she is in more back pain when she is standing and not in much pain sitting. Pain is left sided low back area    Pertinent History PMH: fibromyalgia,HTN,Lt TKA,    Limitations Lifting;Standing;Walking;House hold activities;Sitting    How long can you stand comfortably? maybe 20 min    Diagnostic tests Patient's lumbar MRI from 2021 "exhibits facet osteoarthritis at L3-L4 and L4-L5 with moderate spinal stenosis at L3-L4. "    Pain Onset More than a month ago                               [facility] Adult PT Treatment/Exercise - 01/11/21 0001       Lumbar  Exercises: Stretches   Single Knee to Chest Stretch Right;Left;2 reps;30 seconds    Double Knee to Chest Stretch 10 seconds    Double Knee to Chest Stretch Limitations 10 reps with feet on pball    Lower Trunk Rotation 5 reps;10 seconds    Other Lumbar Stretch Exercise seated pball roll outs into flexion 10 sec X 5 and diagonal to the Rt 10 sec X 5      Lumbar Exercises: Seated   Other Seated Lumbar Exercises on pball pelvic tilts X 10 ea posterior and lateral. Pball marches X 10 bilat, pball diagonal lifts 2#ball X5 ea, pball  rows and extensions green X 15 ea      Manual Therapy   Manual therapy comments palpation during DN, and STM              Trigger Point Dry Needling - 01/11/21 0001     Muscles Treated Back/Hip Lumbar multifidi;Erector spinae                    PT Short Term Goals - 01/04/21 [id number]  PT SHORT TERM GOAL #1   Title Pt will be I and compliant with intiial HEP.    Time 4    Period Weeks    Status On-going    Target Date 01/24/21      PT SHORT TERM GOAL #2   Title Pt will improve pain by overall 25%    Time 4    Period Weeks    Status On-going    Target Date 01/24/21               PT Long Term Goals - 12/27/20 1540       PT LONG TERM GOAL #1   Title Pt will improve pain by overall 50%    Time 8    Period Weeks    Status New    Target Date 02/21/21      PT LONG TERM GOAL #2   Title Pt will improve FOTO to at least 66% functional score    Time 8    Period Weeks    Status New      PT LONG TERM GOAL #3   Title Pt will improve left hip/knee strength to 4+ tested in sitting to improve function.    Time 8    Period Weeks    Status New                   Plan - 01/11/21 1352     Clinical Impression Statement Continued with manual therapy for DN and lumbar STM followed with lumbar stretching and core strengthening today incorporating Pball. PT will continue to progress her function to her tolerance.    Personal  Factors and Comorbidities Comorbidity 3+    Comorbidities PMH: fibromyalgia,HTN,Lt TKA,    Examination-Activity Limitations Bend;Bed Mobility;Carry;Lift;Stand;Squat;Stairs;Sleep;Locomotion Level;Transfers    Examination-Participation Restrictions Cleaning;Driving;Other    Stability/Clinical Decision Making Evolving/Moderate complexity    Rehab Potential Good    PT Frequency 2x / week   1-2   PT Duration 8 weeks    PT Treatment/Interventions Cryotherapy;Electrical Stimulation;Iontophoresis '4mg'$ /ml Dexamethasone;ADLs/Self Care Home Management;Moist Heat;Traction;Ultrasound;Therapeutic activities;Therapeutic exercise;Balance training;Neuromuscular re-education;Manual techniques;Passive range of motion;Dry needling;Spinal Manipulations;Taping    PT Next Visit Plan DN if desired, improve L4, L5 mobility and core strength    PT Home Exercise Plan Signed   Access Code: [email or web address]    Consulted and Agree with Plan of Care Patient             Patient will benefit from skilled therapeutic intervention in order to improve the following deficits and impairments:  Decreased activity tolerance, Decreased endurance, Decreased range of motion, Decreased strength, Difficulty walking, Increased muscle spasms, Impaired flexibility, Postural dysfunction, Pain  Visit Diagnosis: Chronic left-sided low back pain, unspecified whether sciatica present  Muscle weakness (generalized)  Difficulty in walking, not elsewhere classified     Problem List 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   Allergic rhinitis 07/26/2015   Anxiety 07/26/2015   Benign essential hypertension 07/26/2015   Dysfunction of left eustachian tube 07/26/2015   Esophageal reflux 07/26/2015   Fibromyalgia 07/26/2015   Pure hypercholesterolemia 07/26/2015   Status post revision of total  replacement of left knee 08/05/2014   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 05/03/2014   Pes anserinus bursitis of left knee 12/04/2013   Trochanteric bursitis of left hip 08/07/2013   Leg swelling 12/06/2011  Glaucoma 11/02/2011   HLD (hyperlipidemia) 11/02/2011   HTN (hypertension) 11/02/2011   Hypothyroid 11/02/2011   OA (osteoarthritis) 11/02/2011   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 ([place]) 03/16/2011   Presence of artificial knee joint 03/16/2011    Silvestre Mesi 01/11/2021, 1:54 PM  [facility] [address] [place], [place], [address] Phone: [phone]   Fax:  [phone]  Name: MICHEL TILLEY MRN: [id number] Date of Birth: July 14, 1940

## 2021-01-13 ENCOUNTER — Encounter: Payer: Self-pay | Admitting: Rehabilitative and Restorative Service Providers"

## 2021-01-13 ENCOUNTER — Other Ambulatory Visit: Payer: Self-pay

## 2021-01-13 ENCOUNTER — Ambulatory Visit (INDEPENDENT_AMBULATORY_CARE_PROVIDER_SITE_OTHER): Payer: Medicare HMO | Admitting: Rehabilitative and Restorative Service Providers"

## 2021-01-13 DIAGNOSIS — R262 Difficulty in walking, not elsewhere classified: Secondary | ICD-10-CM | POA: Diagnosis not present

## 2021-01-13 DIAGNOSIS — G8929 Other chronic pain: Secondary | ICD-10-CM | POA: Diagnosis not present

## 2021-01-13 DIAGNOSIS — M6281 Muscle weakness (generalized): Secondary | ICD-10-CM | POA: Diagnosis not present

## 2021-01-13 DIAGNOSIS — M545 Low back pain, unspecified: Secondary | ICD-10-CM | POA: Diagnosis not present

## 2021-01-13 NOTE — Therapy (Signed)
[facility] [address] [place], [place], [address] Phone: [phone]   Fax:  [phone]  Physical Therapy Treatment  Patient Details  Name: Barbara Patton MRN: [id number] Date of Birth: 04-13-1941 Referring Provider (PT): Lorine Bears, NP   Encounter Date: 01/13/2021   PT End of Session - 01/13/21 1134     Visit Number 4    Number of Visits 15    Date for PT Re-Evaluation 02/21/21    Authorization Type [employer] MCR    Authorization Time Period to 02/21/2021    Authorization - Visit Number 4    Authorization - Number of Visits 12    Progress Note Due on Visit 10    PT Start Time 1134    PT Stop Time 1218    PT Time Calculation (min) 44 min    Activity Tolerance Patient tolerated treatment well    Behavior During Therapy [facility] for tasks assessed/performed             Past Medical History:  Diagnosis Date   Glaucoma    High cholesterol    Hypertension    Lupus ([place])    Thyroid disease     Past Surgical History:  Procedure Laterality Date   ABDOMINAL HYSTERECTOMY     CATARACT EXTRACTION, BILATERAL     TOTAL KNEE ARTHROPLASTY Bilateral    TUBAL LIGATION      There were no vitals filed for this visit.   Subjective Assessment - 01/13/21 1137     Subjective Pt. indicated not feeling improvement in last day or two since last visit.  Pt. stated back pain continued overall.  Pt. stated today pain is more in Lt side.    Pertinent History PMH: fibromyalgia,HTN,Lt TKA,    Limitations Lifting;Standing;Walking;House hold activities;Sitting    How long can you stand comfortably? maybe 20 min    Diagnostic tests Patient's lumbar MRI from 2021 "exhibits facet osteoarthritis at L3-L4 and L4-L5 with moderate spinal stenosis at L3-L4. "    Currently in Pain? Yes    Pain Score 4     Pain Location Back    Pain Orientation Left    Pain Descriptors / Indicators Aching;Constant    Pain Onset More than a month ago    Aggravating Factors   pain c standing/walking    Pain Relieving Factors sitting                [facility] PT Assessment - 01/13/21 0001       Assessment   Medical Diagnosis Chronic low back pain    Referring Provider (PT) Lorine Bears, NP      AROM   AROM Assessment Site Lumbar    Lumbar Flexion to ankles, painful arc upon return    Lumbar Extension 50% c produced ERP in lumbar and stretching in anterior thigh/hip      Palpation   Palpation comment Lt QL, Lt lumbar paraspinals and multifidi trigger points noted upon palpation to area.                           [facility] Adult PT Treatment/Exercise - 01/13/21 0001       Self-Care   Other Self-Care Comments  Education and discussion regarding presentation of several impacting factors(stenosis, arthritic changes, fibromyalgia).  Education given on HEP use and management of symptoms in limiting standing/walking times to symptom tolerance and use of HEP for symptom reduction.  Lumbar Exercises: Stretches   Single Knee to Chest Stretch Left;Right;5 reps;30 seconds    Lower Trunk Rotation 2 reps   bilateral 15 sec hold     Lumbar Exercises: Seated   Other Seated Lumbar Exercises on green pball pelvic tilts fwd/back x 10, marching x 10 each leg      Manual Therapy   Manual therapy comments Palpation and compression to Lt QL, lumbar paraspinals Lt              Trigger Point Dry Needling - 01/13/21 0001     Muscles Treated Back/Hip Quadratus lumborum;Lumbar multifidi   Lt lumbar paraspoinals in L3-L5 range   Lumbar multifidi Response Twitch response elicited    Quadratus Lumborum Response Twitch response elicited                    PT Short Term Goals - 01/04/21 0934       PT SHORT TERM GOAL #1   Title Pt will be I and compliant with intiial HEP.    Time 4    Period Weeks    Status On-going    Target Date 01/24/21      PT SHORT TERM GOAL #2   Title Pt will improve pain by overall 25%    Time 4     Period Weeks    Status On-going    Target Date 01/24/21               PT Long Term Goals - 12/27/20 1540       PT LONG TERM GOAL #1   Title Pt will improve pain by overall 50%    Time 8    Period Weeks    Status New    Target Date 02/21/21      PT LONG TERM GOAL #2   Title Pt will improve FOTO to at least 66% functional score    Time 8    Period Weeks    Status New      PT LONG TERM GOAL #3   Title Pt will improve left hip/knee strength to 4+ tested in sitting to improve function.    Time 8    Period Weeks    Status New                   Plan - 01/13/21 1203     Clinical Impression Statement Pt. indicated concordant symptoms from Lt QL, Lt lumbar paraspinals/multifidi today.  Discussion c Pt. provided about presence of multiple factors in presentation of symptoms including fibromyalgia/trigger points complaints, lumbar stenosis symptoms presentation.  Extended time spent in self care education regarding presentation and at home HEP use.  Overall improvement at this time has been indicated as more temporary vs. long term lasting.  Continued presentation of symptoms and impairments indicate continued skilled PT services at this time with goals of reduced symptoms and improved mobility/activity tolerance.    Personal Factors and Comorbidities Comorbidity 3+    Comorbidities PMH: fibromyalgia,HTN,Lt TKA,    Examination-Activity Limitations Bend;Bed Mobility;Carry;Lift;Stand;Squat;Stairs;Sleep;Locomotion Level;Transfers    Examination-Participation Restrictions Cleaning;Driving;Other    Stability/Clinical Decision Making Evolving/Moderate complexity    Rehab Potential Good    PT Frequency 2x / week   1-2   PT Duration 8 weeks    PT Treatment/Interventions Cryotherapy;Electrical Stimulation;Iontophoresis '4mg'$ /ml Dexamethasone;ADLs/Self Care Home Management;Moist Heat;Traction;Ultrasound;Therapeutic activities;Therapeutic exercise;Balance training;Neuromuscular  re-education;Manual techniques;Passive range of motion;Dry needling;Spinal Manipulations;Taping    PT Next Visit Plan DN as desired.  Interventions to promote  improved lumbar mobility and standing tolerance    PT Home Exercise Plan Signed   Access Code: [email or web address]    Consulted and Agree with Plan of Care Patient             Patient will benefit from skilled therapeutic intervention in order to improve the following deficits and impairments:  Decreased activity tolerance, Decreased endurance, Decreased range of motion, Decreased strength, Difficulty walking, Increased muscle spasms, Impaired flexibility, Postural dysfunction, Pain  Visit Diagnosis: Chronic left-sided low back pain, unspecified whether sciatica present  Muscle weakness (generalized)  Difficulty in walking, not elsewhere classified     Problem List 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 05/03/2014   Pes anserinus bursitis of left knee 12/04/2013   Trochanteric bursitis of left hip 08/07/2013   Leg swelling 12/06/2011   Glaucoma 11/02/2011   HLD (hyperlipidemia) 11/02/2011   HTN (hypertension) 11/02/2011   Hypothyroid 11/02/2011   OA (osteoarthritis) 11/02/2011   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 [place], [place],  [address] Phone: [phone]   Fax:  [phone]  Name: Barbara Patton MRN: [id number] Date of Birth: [birth year]

## 2021-01-17 DIAGNOSIS — R0989 Other specified symptoms and signs involving the circulatory and respiratory systems: Secondary | ICD-10-CM | POA: Diagnosis not present

## 2021-01-18 ENCOUNTER — Ambulatory Visit (INDEPENDENT_AMBULATORY_CARE_PROVIDER_SITE_OTHER): Payer: Medicare HMO | Admitting: Physical Therapy

## 2021-01-18 ENCOUNTER — Other Ambulatory Visit: Payer: Self-pay

## 2021-01-18 ENCOUNTER — Encounter: Payer: Self-pay | Admitting: Physical Therapy

## 2021-01-18 DIAGNOSIS — M6281 Muscle weakness (generalized): Secondary | ICD-10-CM | POA: Diagnosis not present

## 2021-01-18 DIAGNOSIS — R262 Difficulty in walking, not elsewhere classified: Secondary | ICD-10-CM | POA: Diagnosis not present

## 2021-01-18 DIAGNOSIS — G8929 Other chronic pain: Secondary | ICD-10-CM | POA: Diagnosis not present

## 2021-01-18 DIAGNOSIS — R29898 Other symptoms and signs involving the musculoskeletal system: Secondary | ICD-10-CM | POA: Diagnosis not present

## 2021-01-18 DIAGNOSIS — M545 Low back pain, unspecified: Secondary | ICD-10-CM

## 2021-01-18 NOTE — Therapy (Signed)
[facility] [address] [place], [place], [address] Phone: [phone]   Fax:  [phone]  Physical Therapy Treatment  Patient Details  Name: Barbara Patton MRN: [id number] Date of Birth: [birth year] Referring Provider (PT): Lorine Bears, NP   Encounter Date: 01/18/2021   PT End of Session - 01/18/21 1351     Visit Number 5    Number of Visits 15    Date for PT Re-Evaluation 02/21/21    Authorization Type [employer] MCR    Authorization Time Period to 02/21/2021    Authorization - Visit Number 5    Authorization - Number of Visits 12    Progress Note Due on Visit 10    PT Start Time 1300    PT Stop Time 1343    PT Time Calculation (min) 43 min    Activity Tolerance Patient tolerated treatment well    Behavior During Therapy [facility] for tasks assessed/performed             Past Medical History:  Diagnosis Date   Glaucoma    High cholesterol    Hypertension    Lupus ([place])    Thyroid disease     Past Surgical History:  Procedure Laterality Date   ABDOMINAL HYSTERECTOMY     CATARACT EXTRACTION, BILATERAL     TOTAL KNEE ARTHROPLASTY Bilateral    TUBAL LIGATION      There were no vitals filed for this visit.   Subjective Assessment - 01/18/21 1324     Subjective She states she has overall has some improvements in pain over last 2 days but then she was bending down to do laundry and had some sharp pain to her Lt low back and hip, this has resoloved mostly since then.    Pertinent History PMH: fibromyalgia,HTN,Lt TKA,    Limitations Lifting;Standing;Walking;House hold activities;Sitting    How long can you stand comfortably? maybe 20 min    Diagnostic tests Patient's lumbar MRI from 2021 "exhibits facet osteoarthritis at L3-L4 and L4-L5 with moderate spinal stenosis at L3-L4. "    Pain Onset More than a month ago                               [facility] Adult PT Treatment/Exercise - 01/18/21 0001        Lumbar Exercises: Stretches   Double Knee to Chest Stretch 10 seconds    Double Knee to Chest Stretch Limitations 10 reps with feet on pball    Lower Trunk Rotation 5 reps;10 seconds      Lumbar Exercises: Standing   Row Both;15 reps    Theraband Level (Row) Level 3 (Green)    Row Limitations alternating    Shoulder Extension Both;15 reps    Theraband Level (Shoulder Extension) Level 3 (Green)    Shoulder Extension Limitations alternating    Other Standing Lumbar Exercises simulated bowling with reciprocal step and Rt arm moving into flexion with 2# ball X 10 then progressing to 3 step approach X 10, needs min guard to min A for balance with this at this time.      Lumbar Exercises: Supine   Clam 20 reps    Clam Limitations green    Straight Leg Raise 10 reps      Manual Therapy   Manual therapy comments Palpation and compression to Lt QL, lumbar paraspinals Lt  Trigger Point Dry Needling - 01/18/21 0001     Consent Given? Yes    Muscles Treated Back/Hip Quadratus lumborum;Lumbar multifidi   L3-5 QL   Lumbar multifidi Response Twitch response elicited                    PT Short Term Goals - 01/04/21 0934       PT SHORT TERM GOAL #1   Title Pt will be I and compliant with intiial HEP.    Time 4    Period Weeks    Status On-going    Target Date 01/24/21      PT SHORT TERM GOAL #2   Title Pt will improve pain by overall 25%    Time 4    Period Weeks    Status On-going    Target Date 01/24/21               PT Long Term Goals - 12/27/20 1540       PT LONG TERM GOAL #1   Title Pt will improve pain by overall 50%    Time 8    Period Weeks    Status New    Target Date 02/21/21      PT LONG TERM GOAL #2   Title Pt will improve FOTO to at least 66% functional score    Time 8    Period Weeks    Status New      PT LONG TERM GOAL #3   Title Pt will improve left hip/knee strength to 4+ tested in sitting to improve function.     Time 8    Period Weeks    Status New                   Plan - 01/18/21 1352     Clinical Impression Statement Continued with DN to Lt QL, lumbar P.S, and multifid today with good overall tolerance to this. She has goal to begin her bowling league next month so we began gentle simulated bowling exericses today and she will need graded approach to this for strength and balance before she will be able to bowl for real. She needed min A overall balance today with this.    Personal Factors and Comorbidities Comorbidity 3+    Comorbidities PMH: fibromyalgia,HTN,Lt TKA,    Examination-Activity Limitations Bend;Bed Mobility;Carry;Lift;Stand;Squat;Stairs;Sleep;Locomotion Level;Transfers    Examination-Participation Restrictions Cleaning;Driving;Other    Stability/Clinical Decision Making Evolving/Moderate complexity    Rehab Potential Good    PT Frequency 2x / week   1-2   PT Duration 8 weeks    PT Treatment/Interventions Cryotherapy;Electrical Stimulation;Iontophoresis '4mg'$ /ml Dexamethasone;ADLs/Self Care Home Management;Moist Heat;Traction;Ultrasound;Therapeutic activities;Therapeutic exercise;Balance training;Neuromuscular re-education;Manual techniques;Passive range of motion;Dry needling;Spinal Manipulations;Taping    PT Next Visit Plan DN as desired.  Interventions to promote improved lumbar mobility and standing tolerance, she has goal to get back to bowling.    PT Home Exercise Plan Signed   Access Code: [email or web address]    Consulted and Agree with Plan of Care Patient             Patient will benefit from skilled therapeutic intervention in order to improve the following deficits and impairments:  Decreased activity tolerance, Decreased endurance, Decreased range of motion, Decreased strength, Difficulty walking, Increased muscle spasms, Impaired flexibility, Postural dysfunction, Pain  Visit Diagnosis: Chronic left-sided low back pain, unspecified whether sciatica present  Muscle  weakness (generalized)  Difficulty in walking, not elsewhere classified  Other symptoms and signs involving  the musculoskeletal system  Chronic midline low back pain without sciatica     Problem List 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 05/03/2014   Pes anserinus bursitis of left knee 12/04/2013   Trochanteric bursitis of left hip 08/07/2013   Leg swelling 12/06/2011   Glaucoma 11/02/2011   HLD (hyperlipidemia) 11/02/2011   HTN (hypertension) 11/02/2011   Hypothyroid 11/02/2011   OA (osteoarthritis) 11/02/2011   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) 03/16/2011   Presence of artificial knee joint 03/16/2011    Debbe Odea, PT,DPT 01/18/2021, 1:55 PM  [facility] [address] [place], [place], [address] Phone: [phone]   Fax:  [phone]  Name: Barbara Patton MRN: [id number] Date of Birth: [birth year]

## 2021-01-20 ENCOUNTER — Encounter: Payer: Self-pay | Admitting: Physical Therapy

## 2021-01-20 ENCOUNTER — Other Ambulatory Visit: Payer: Self-pay

## 2021-01-20 ENCOUNTER — Ambulatory Visit (INDEPENDENT_AMBULATORY_CARE_PROVIDER_SITE_OTHER): Payer: Medicare HMO | Admitting: Physical Therapy

## 2021-01-20 DIAGNOSIS — R29898 Other symptoms and signs involving the musculoskeletal system: Secondary | ICD-10-CM | POA: Diagnosis not present

## 2021-01-20 DIAGNOSIS — G8929 Other chronic pain: Secondary | ICD-10-CM

## 2021-01-20 DIAGNOSIS — R262 Difficulty in walking, not elsewhere classified: Secondary | ICD-10-CM | POA: Diagnosis not present

## 2021-01-20 DIAGNOSIS — M6281 Muscle weakness (generalized): Secondary | ICD-10-CM

## 2021-01-20 DIAGNOSIS — M545 Low back pain, unspecified: Secondary | ICD-10-CM

## 2021-01-20 NOTE — Therapy (Signed)
[facility] [address] [place], [place], [address] Phone: [phone]   Fax:  [phone]  Physical Therapy Treatment  Patient Details  Name: Barbara Patton MRN: [id number] Date of Birth: [birth year] Referring Provider (PT): Lorine Bears, NP   Encounter Date: 01/20/2021   PT End of Session - 01/20/21 1440     Visit Number 6    Number of Visits 15    Date for PT Re-Evaluation 02/21/21    Authorization Type [employer] MCR    Authorization Time Period to 02/21/2021    Authorization - Visit Number 6    Authorization - Number of Visits 12    Progress Note Due on Visit 10    PT Start Time [id number]    PT Stop Time 1430    PT Time Calculation (min) 45 min    Activity Tolerance Patient tolerated treatment well    Behavior During Therapy [facility] for tasks assessed/performed             Past Medical History:  Diagnosis Date   Glaucoma    High cholesterol    Hypertension    Lupus ([place])    Thyroid disease     Past Surgical History:  Procedure Laterality Date   ABDOMINAL HYSTERECTOMY     CATARACT EXTRACTION, BILATERAL     TOTAL KNEE ARTHROPLASTY Bilateral    TUBAL LIGATION      There were no vitals filed for this visit.   Subjective Assessment - 01/20/21 1419     Subjective She states the PT really only helps for a day or 2. She can not tell signiifanct improvement overall and is getting discouraged. She relays pain is about 6/10 today in her low back and that she has no pain with laying or sitting but as soon as she gets up she gets the pain.    Pertinent History PMH: fibromyalgia,HTN,Lt TKA,    Limitations Lifting;Standing;Walking;House hold activities;Sitting    How long can you stand comfortably? maybe 20 min    Diagnostic tests Patient's lumbar MRI from 2021 "exhibits facet osteoarthritis at L3-L4 and L4-L5 with moderate spinal stenosis at L3-L4. "    Pain Onset More than a month ago                                [facility] Adult PT Treatment/Exercise - 01/20/21 0001       Lumbar Exercises: Stretches   Other Lumbar Stretch Exercise standing L stretch holding on at sink for lumbar flexion 5 sec X 10      Lumbar Exercises: Standing   Other Standing Lumbar Exercises hip 3 way (abd, ext, flexion) X 15 reps bilat with red      Modalities   Modalities Electrical Stimulation      Electrical Stimulation   Electrical Stimulation Location lumbar    Electrical Stimulation Action IFC    Electrical Stimulation Parameters to tolerance L 18 in sidelying  X 20 min    Electrical Stimulation Goals Pain                      PT Short Term Goals - 01/04/21 0934       PT SHORT TERM GOAL #1   Title Pt will be I and compliant with intiial HEP.    Time 4    Period Weeks    Status On-going    Target Date 01/24/21  PT SHORT TERM GOAL #2   Title Pt will improve pain by overall 25%    Time 4    Period Weeks    Status On-going    Target Date 01/24/21               PT Long Term Goals - 12/27/20 1540       PT LONG TERM GOAL #1   Title Pt will improve pain by overall 50%    Time 8    Period Weeks    Status New    Target Date 02/21/21      PT LONG TERM GOAL #2   Title Pt will improve FOTO to at least 66% functional score    Time 8    Period Weeks    Status New      PT LONG TERM GOAL #3   Title Pt will improve left hip/knee strength to 4+ tested in sitting to improve function.    Time 8    Period Weeks    Status New                   Plan - 01/20/21 1442     Clinical Impression Statement She will see MD about her shoulder tommorow so was encouraged to ask him about any other options for pain managment. She has been attending PT, performing HEP, and going to the [facility] for exercise, aquatics and chair yoga however continues to have pain levels that are widespread. We trialed TENS therapy today to see if this would give her any other  pain relief.    Personal Factors and Comorbidities Comorbidity 3+    Comorbidities PMH: fibromyalgia,HTN,Lt TKA,    Examination-Activity Limitations Bend;Bed Mobility;Carry;Lift;Stand;Squat;Stairs;Sleep;Locomotion Level;Transfers    Examination-Participation Restrictions Cleaning;Driving;Other    Stability/Clinical Decision Making Evolving/Moderate complexity    Rehab Potential Good    PT Frequency 2x / week   1-2   PT Duration 8 weeks    PT Treatment/Interventions Cryotherapy;Electrical Stimulation;Iontophoresis '4mg'$ /ml Dexamethasone;ADLs/Self Care Home Management;Moist Heat;Traction;Ultrasound;Therapeutic activities;Therapeutic exercise;Balance training;Neuromuscular re-education;Manual techniques;Passive range of motion;Dry needling;Spinal Manipulations;Taping    PT Next Visit Plan how was TEN? Interventions to promote improved lumbar mobility and standing tolerance, she has goal to get back to bowling.    PT Home Exercise Plan Signed   Access Code: [email or web address] and Agree with Plan of Care Patient             Patient will benefit from skilled therapeutic intervention in order to improve the following deficits and impairments:  Decreased activity tolerance, Decreased endurance, Decreased range of motion, Decreased strength, Difficulty walking, Increased muscle spasms, Impaired flexibility, Postural dysfunction, Pain  Visit Diagnosis: Chronic left-sided low back pain, unspecified whether sciatica present  Muscle weakness (generalized)  Difficulty in walking, not elsewhere classified  Other symptoms and signs involving the musculoskeletal system  Chronic midline low back pain without sciatica     Problem List 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   Allergic rhinitis 07/26/2015   Anxiety 07/26/2015   Benign  essential hypertension 07/26/2015   Dysfunction of left eustachian tube 07/26/2015   Esophageal reflux 07/26/2015   Fibromyalgia 07/26/2015   Pure hypercholesterolemia 07/26/2015   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 [place], [place], [address] Phone: [phone]   Fax:  [phone]  Name: GRACY HEADINGTON MRN: [id number] Date of Birth: December 13, 1940

## 2021-01-21 ENCOUNTER — Encounter: Payer: Self-pay | Admitting: Orthopedic Surgery

## 2021-01-21 ENCOUNTER — Ambulatory Visit (INDEPENDENT_AMBULATORY_CARE_PROVIDER_SITE_OTHER): Payer: Medicare HMO | Admitting: Orthopedic Surgery

## 2021-01-21 DIAGNOSIS — M12811 Other specific arthropathies, not elsewhere classified, right shoulder: Secondary | ICD-10-CM | POA: Diagnosis not present

## 2021-01-23 MED ORDER — BUPIVACAINE HCL 0.5 % IJ SOLN
9.0000 mL | INTRAMUSCULAR | Status: AC | PRN
  Administered 2021-01-21: 9 mL via INTRA_ARTICULAR

## 2021-01-23 MED ORDER — METHYLPREDNISOLONE ACETATE 40 MG/ML IJ SUSP
40.0000 mg | INTRAMUSCULAR | Status: AC | PRN
  Administered 2021-01-21: 40 mg via INTRA_ARTICULAR

## 2021-01-23 MED ORDER — LIDOCAINE HCL 1 % IJ SOLN
5.0000 mL | INTRAMUSCULAR | Status: AC | PRN
  Administered 2021-01-21: 5 mL

## 2021-01-23 NOTE — Progress Notes (Signed)
Office Visit Note   Patient: Barbara Patton           Date of Birth: Sep 14, 1940           MRN: [id number] Visit Date: 01/21/2021 Requested by: Seward Carol, MD [address] [place],  [place] [address] PCP: Seward Carol, MD  Subjective: Chief Complaint  Patient presents with   Right Shoulder - Follow-up    HPI: Patient presents for evaluation of right shoulder pain.  She has known history of right shoulder rotator cuff arthropathy.  Denies any radicular symptoms.  Injections in her neck have not been helping.  She is also been going to therapy which is also not helping.  She wants to avoid surgery.  She resumes bowling in September.  She goes to the [facility] 4 days a week.  Reports some biceps pain with some scapular pain.  She has a known history of fibromyalgia.              ROS: All systems reviewed are negative as they relate to the chief complaint within the history of present illness.  Patient denies  fevers or chills.   Assessment & Plan: Visit Diagnoses:  1. Rotator cuff arthropathy of right shoulder     Plan: Impression is right shoulder rotator cuff arthropathy with improvement in the past from injection.  Taking tramadol and occasional Tylenol.  Those have not helped.  CBD cream has been helping.  She is requesting injection today which we will perform.  Follow-up as needed.  Follow-Up Instructions: Return if symptoms worsen or fail to improve.   Orders:  No orders of the defined types were placed in this encounter.  No orders of the defined types were placed in this encounter.     Procedures: Large Joint Inj: R glenohumeral on 01/21/2021 9:55 PM Indications: diagnostic evaluation and pain Details: 18 G 1.5 in needle, posterior approach  Arthrogram: No  Medications: 9 mL bupivacaine 0.5 %; 40 mg methylPREDNISolone acetate 40 MG/ML; 5 mL lidocaine 1 % Outcome: tolerated well, no immediate complications Procedure, treatment alternatives, risks and  benefits explained, specific risks discussed. Consent was given by the patient. Immediately prior to procedure a time out was called to verify the correct patient, procedure, equipment, support staff and site/side marked as required. Patient was prepped and draped in the usual sterile fashion.      Clinical Data: No additional findings.  Objective: Vital Signs: There were no vitals taken for this visit.  Physical Exam:   Constitutional: Patient appears well-developed HEENT:  Head: Normocephalic Eyes:EOM are normal Neck: Normal range of motion Cardiovascular: Normal rate Pulmonary/chest: Effort normal Neurologic: Patient is alert Skin: Skin is warm Psychiatric: Patient has normal mood and affect   Ortho Exam: Ortho exam actually demonstrates pretty reasonable strength in that right shoulder.  She has passive range of motion of 35/90/140.  Rotator cuff strength infraspinatus and subscap testing actually pretty reasonable.  There is some coarse grinding with passive range of motion of the shoulder.  No other masses lymphadenopathy or skin changes noted in that shoulder girdle region.  Neck range of motion does not really reproduce any symptoms in the shoulder.  Specialty Comments:  No specialty comments available.  Imaging: No results found.   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 05/03/2014   Pes anserinus bursitis of left knee 12/04/2013   Trochanteric  bursitis of left hip 08/07/2013   Leg swelling 12/06/2011   Glaucoma 11/02/2011   HLD (hyperlipidemia) 11/02/2011   HTN (hypertension) 11/02/2011   Hypothyroid 11/02/2011   OA (osteoarthritis) 11/02/2011   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 ([place]) 03/16/2011   Presence of artificial knee joint 03/16/2011   Past Medical History:  Diagnosis Date   Glaucoma    High cholesterol    Hypertension    Lupus ([place])    Thyroid disease     Family History  Problem Relation Age of Onset   Cancer Father    Cancer Brother    Cancer Brother    Stroke Brother     Past Surgical History:  Procedure Laterality Date   ABDOMINAL HYSTERECTOMY     CATARACT EXTRACTION, BILATERAL     TOTAL KNEE ARTHROPLASTY Bilateral    TUBAL LIGATION     Social History   Occupational History   Not on file  Tobacco Use   Smoking status: Former    Types: Cigarettes    Quit date: 1997    Years since quitting: 25.6   Smokeless tobacco: Never  Vaping Use   Vaping Use: Never used  Substance and Sexual Activity   Alcohol use: Yes    Alcohol/week: 3.0 standard drinks    Types: 3 Glasses of wine per week    Comment: occ wine   Drug use: No   Sexual activity: Not on file

## 2021-01-25 ENCOUNTER — Other Ambulatory Visit: Payer: Self-pay

## 2021-01-25 ENCOUNTER — Ambulatory Visit (INDEPENDENT_AMBULATORY_CARE_PROVIDER_SITE_OTHER): Payer: Medicare HMO | Admitting: Physical Therapy

## 2021-01-25 DIAGNOSIS — G8929 Other chronic pain: Secondary | ICD-10-CM | POA: Diagnosis not present

## 2021-01-25 DIAGNOSIS — M545 Low back pain, unspecified: Secondary | ICD-10-CM | POA: Diagnosis not present

## 2021-01-25 DIAGNOSIS — R262 Difficulty in walking, not elsewhere classified: Secondary | ICD-10-CM

## 2021-01-25 DIAGNOSIS — M6281 Muscle weakness (generalized): Secondary | ICD-10-CM | POA: Diagnosis not present

## 2021-01-25 DIAGNOSIS — R29898 Other symptoms and signs involving the musculoskeletal system: Secondary | ICD-10-CM

## 2021-01-25 NOTE — Therapy (Signed)
[facility] Physical Therapy [address] [place], [place], [address] Phone: [phone]   Fax:  [phone]  Physical Therapy Treatment/Discharge PHYSICAL THERAPY DISCHARGE SUMMARY  Visits from Start of Care: 7  Current functional level related to goals / functional outcomes: See below   Remaining deficits: See below   Education / Equipment: HEP, plan to follow up with MD Plan: Patient agrees to discharge.  Patient goals were not met. Patient is being discharged due to lack of progress and lack of pain relief.      Patient Details  Name: Barbara Patton MRN: [id number] Date of Birth: 1940/07/02 Referring Provider (PT): Lorine Bears, NP   Encounter Date: 01/25/2021   PT End of Session - 01/25/21 1315     Visit Number 7    Number of Visits 15    Date for PT Re-Evaluation 02/21/21    Authorization Type [employer] MCR    Authorization Time Period to 02/21/2021    Authorization - Visit Number 7    Authorization - Number of Visits 12    Progress Note Due on Visit 10    PT Start Time 1300    PT Stop Time 1348    PT Time Calculation (min) 48 min    Activity Tolerance Patient tolerated treatment well    Behavior During Therapy WFL for tasks assessed/performed             Past Medical History:  Diagnosis Date   Glaucoma    High cholesterol    Hypertension    Lupus ([place])    Thyroid disease     Past Surgical History:  Procedure Laterality Date   ABDOMINAL HYSTERECTOMY     CATARACT EXTRACTION, BILATERAL     TOTAL KNEE ARTHROPLASTY Bilateral    TUBAL LIGATION      There were no vitals filed for this visit.   Subjective Assessment - 01/25/21 1313     Subjective Still no significant change from trialing TENS and other PT exericses. She did have injection in her shoulder which helped. She agrees to finish up with PT today due to lack of progress with PT.    Pertinent History PMH: fibromyalgia,HTN,Lt TKA,    Limitations  Lifting;Standing;Walking;House hold activities;Sitting    How long can you stand comfortably? maybe 20 min    Diagnostic tests Patient's lumbar MRI from 2021 "exhibits facet osteoarthritis at L3-L4 and L4-L5 with moderate spinal stenosis at L3-L4. "    Pain Onset More than a month ago                [facility] PT Assessment - 01/25/21 0001       Assessment   Medical Diagnosis Chronic low back pain    Referring Provider (PT) Lorine Bears, NP      Strength   Overall Strength Comments Left leg overall 4/5 MMT, Rt leg overall 4+                           [facility] Adult PT Treatment/Exercise - 01/25/21 0001       Lumbar Exercises: Stretches   Single Knee to Chest Stretch Right;Left;2 reps;30 seconds    Double Knee to Chest Stretch 10 seconds    Double Knee to Chest Stretch Limitations 10 reps with feet on pball    Lower Trunk Rotation 5 reps;10 seconds      Lumbar Exercises: Seated   Other Seated Lumbar Exercises on green pball pelvic tilts  lateral and cirlces x 10, marching x 10 each leg, rows and extensions X10      Lumbar Exercises: Supine   Pelvic Tilt 20 reps    Clam 20 reps    Clam Limitations blue      Modalities   Modalities Cryotherapy      Cryotherapy   Number Minutes Cryotherapy 10 Minutes    Cryotherapy Location Hip    Type of Cryotherapy Ice pack                      PT Short Term Goals - 01/25/21 1319       PT SHORT TERM GOAL #1   Title Pt will be I and compliant with intiial HEP.    Time 4    Period Weeks    Status Achieved    Target Date 01/24/21      PT SHORT TERM GOAL #2   Title Pt will improve pain by overall 25%    Time 4    Period Weeks    Status Not Met    Target Date 01/24/21               PT Long Term Goals - 01/25/21 1320       PT LONG TERM GOAL #1   Title Pt will improve pain by overall 50%    Time 8    Period Weeks    Status Not Met      PT LONG TERM GOAL #2   Title Pt will improve FOTO  to at least 66% functional score    Time 8    Period Weeks    Status Not Met      PT LONG TERM GOAL #3   Title Pt will improve left hip/knee strength to 4+ tested in sitting to improve function.    Time 8    Period Weeks    Status Not Met      PT LONG TERM GOAL #4   Status Not Met                   Plan - 01/25/21 1316     Clinical Impression Statement Due to lack of progress and pain relief we will discharge today as we have exhausated conservative PT interventions including therex for strengthening, stretching, manual therapy, Dry neeling, TENS, and ice.Marland Kitchen Her SIJ belt she ordered may have helped some. She does not have pain with sitting or laying but still with continued widespread pain upon standing that has not improved with PT.  She was encouraged to continue working out at [employer] and to remain active as well as continuing to follow up with MD about injections or other treatment interventions. She had no further questions or concerns about discharge.    Personal Factors and Comorbidities Comorbidity 3+    Comorbidities PMH: fibromyalgia,HTN,Lt TKA,    Examination-Activity Limitations Bend;Bed Mobility;Carry;Lift;Stand;Squat;Stairs;Sleep;Locomotion Level;Transfers    Examination-Participation Restrictions Cleaning;Driving;Other    Stability/Clinical Decision Making Evolving/Moderate complexity    Rehab Potential Good    PT Frequency 2x / week   1-2   PT Duration 8 weeks    PT Treatment/Interventions Cryotherapy;Electrical Stimulation;Iontophoresis 19m/ml Dexamethasone;ADLs/Self Care Home Management;Moist Heat;Traction;Ultrasound;Therapeutic activities;Therapeutic exercise;Balance training;Neuromuscular re-education;Manual techniques;Passive range of motion;Dry needling;Spinal Manipulations;Taping    PT Next Visit Plan DC today.    PT Home Exercise Plan Signed   Access Code: [email or web address]    UDTHYHOOIand Agree with Plan of Care Patient  Patient will benefit  from skilled therapeutic intervention in order to improve the following deficits and impairments:  Decreased activity tolerance, Decreased endurance, Decreased range of motion, Decreased strength, Difficulty walking, Increased muscle spasms, Impaired flexibility, Postural dysfunction, Pain  Visit Diagnosis: Chronic left-sided low back pain, unspecified whether sciatica present  Muscle weakness (generalized)  Difficulty in walking, not elsewhere classified  Other symptoms and signs involving the musculoskeletal system     Problem List 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 [place], [place], [address] Phone: [phone]   Fax:  [phone]  Name: Barbara Patton MRN: [id number] Date of Birth: 02-Apr-1941

## 2021-01-27 ENCOUNTER — Encounter: Payer: Medicare HMO | Admitting: Physical Therapy

## 2021-02-10 DIAGNOSIS — R0982 Postnasal drip: Secondary | ICD-10-CM | POA: Diagnosis not present

## 2021-02-10 DIAGNOSIS — J384 Edema of larynx: Secondary | ICD-10-CM | POA: Diagnosis not present

## 2021-02-10 DIAGNOSIS — J3489 Other specified disorders of nose and nasal sinuses: Secondary | ICD-10-CM | POA: Diagnosis not present

## 2021-02-10 DIAGNOSIS — E039 Hypothyroidism, unspecified: Secondary | ICD-10-CM | POA: Diagnosis not present

## 2021-02-10 DIAGNOSIS — R490 Dysphonia: Secondary | ICD-10-CM | POA: Diagnosis not present

## 2021-02-10 DIAGNOSIS — I10 Essential (primary) hypertension: Secondary | ICD-10-CM | POA: Diagnosis not present

## 2021-02-10 DIAGNOSIS — R0989 Other specified symptoms and signs involving the circulatory and respiratory systems: Secondary | ICD-10-CM | POA: Diagnosis not present

## 2021-02-10 DIAGNOSIS — K219 Gastro-esophageal reflux disease without esophagitis: Secondary | ICD-10-CM | POA: Diagnosis not present

## 2021-02-10 DIAGNOSIS — E785 Hyperlipidemia, unspecified: Secondary | ICD-10-CM | POA: Diagnosis not present

## 2021-02-10 DIAGNOSIS — E7800 Pure hypercholesterolemia, unspecified: Secondary | ICD-10-CM | POA: Diagnosis not present

## 2021-02-16 ENCOUNTER — Telehealth: Payer: Self-pay

## 2021-02-16 NOTE — Telephone Encounter (Signed)
Pt would like a call back to schedule an appt with Dr. Ernestina Patches

## 2021-02-16 NOTE — Telephone Encounter (Signed)
Left L4-5 IL on 12/23/20. Please advise.

## 2021-02-17 ENCOUNTER — Telehealth: Payer: Self-pay | Admitting: Physical Medicine and Rehabilitation

## 2021-02-17 NOTE — Telephone Encounter (Signed)
See previous message

## 2021-02-17 NOTE — Telephone Encounter (Signed)
Patient called. Says returning a call to schedule with Dr. Ernestina Patches.

## 2021-02-17 NOTE — Telephone Encounter (Signed)
She states that she had 50% relief for about a month.

## 2021-02-17 NOTE — Telephone Encounter (Signed)
Left message #1

## 2021-02-18 DIAGNOSIS — R600 Localized edema: Secondary | ICD-10-CM | POA: Diagnosis not present

## 2021-02-18 DIAGNOSIS — I5189 Other ill-defined heart diseases: Secondary | ICD-10-CM | POA: Diagnosis not present

## 2021-02-18 DIAGNOSIS — Z23 Encounter for immunization: Secondary | ICD-10-CM | POA: Diagnosis not present

## 2021-02-18 DIAGNOSIS — R251 Tremor, unspecified: Secondary | ICD-10-CM | POA: Diagnosis not present

## 2021-02-18 NOTE — Telephone Encounter (Signed)
Scheduled for OV. 

## 2021-02-21 ENCOUNTER — Other Ambulatory Visit: Payer: Self-pay

## 2021-02-21 ENCOUNTER — Ambulatory Visit (INDEPENDENT_AMBULATORY_CARE_PROVIDER_SITE_OTHER): Payer: Medicare HMO | Admitting: Physical Medicine and Rehabilitation

## 2021-02-21 ENCOUNTER — Encounter: Payer: Self-pay | Admitting: Physical Medicine and Rehabilitation

## 2021-02-21 VITALS — BP 135/68 | HR 96

## 2021-02-21 DIAGNOSIS — M25552 Pain in left hip: Secondary | ICD-10-CM | POA: Diagnosis not present

## 2021-02-21 DIAGNOSIS — M5416 Radiculopathy, lumbar region: Secondary | ICD-10-CM | POA: Diagnosis not present

## 2021-02-21 DIAGNOSIS — M47816 Spondylosis without myelopathy or radiculopathy, lumbar region: Secondary | ICD-10-CM | POA: Diagnosis not present

## 2021-02-21 DIAGNOSIS — M48062 Spinal stenosis, lumbar region with neurogenic claudication: Secondary | ICD-10-CM

## 2021-02-21 NOTE — Progress Notes (Signed)
Barbara Patton - 80 y.o. female MRN [id number]  Date of birth: 1940/11/15  Office Visit Note: Visit Date: 02/21/2021 PCP: Seward Carol, MD Referred by: Seward Carol, MD  Subjective: Chief Complaint  Patient presents with   Lower Back - Pain   Left Leg - Pain   Right Leg - Pain   HPI: Barbara Patton is a 80 y.o. female who comes in today for evaluation of chronic, worsening and severe bilateral lower back pain radiating to buttocks, hips and groin. Patient's voices most severe pain today is left buttock, hip and groin. Patient reports pain is exacerbated by walking and activity, describes as soreness and currently rates as 6 out of 10. Patient reports some relief of pain with Tylenol, Tramadol and stretching exercises. 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 Patient had left L4-L5 interlaminar epidural steroid injection in July, which she reports 75% relief of bilateral lower back pain for one month, however no relief of buttock, hip and groin pain. Patient recently completed in-house formal physical therapy, which she reports did not help to alleviate pain. Patient states she continues to do formal exercises at home and is going to the [facility] several times a week. Patient also continues to remain active with her bowling league. Patient continues to have generalized pain all over and is being treated by Dr. Seward Carol whom manages her Tramadol. Patient denies focal weakness, numbness and tingling. Patient denies recent trauma or falls.   Review of Systems  Musculoskeletal:  Positive for back pain and joint pain.  Neurological:  Negative for tremors, sensory change, focal weakness and weakness.  All other systems reviewed and are negative. Otherwise per HPI.  Assessment & Plan: Visit Diagnoses:    ICD-10-CM   1. Pain in left hip  M25.552 Ambulatory referral to Physical Medicine Rehab    2. Lumbar  radiculopathy  M54.16     3. Spondylosis without myelopathy or radiculopathy, lumbar region  M47.816     4. Facet arthropathy, lumbar  M47.816     5. Spinal stenosis of lumbar region with neurogenic claudication  M48.062        Plan: Findings:  Chronic, worsening and severe bilateral lower back pain radiating to buttocks, hips and groin. Excruciating pain to left buttock, hip and groin. Previous left L4-L5 interlaminar epidural steroid injection did help to alleviate bilateral lower back pain, however left buttock, hip and groin pain continues despite home exercise program and medications. We feel that patient's clinical presentation and exam today are more consistent with hip related etiology, however we can't exclude lumbar radiculopathy.  We feel the next step is to perform a diagnostic and hopefully therapeutic left intra-articular steroid hip injection. If patient gets good and sustained relief from left hip injection we will monitor, if she continues to have pain we will consider lumbar epidural injection. Patient also continues to have issues with fibromyalgia, which we feel could be exacerbating her condition. We instructed patient to follow-up with Dr. Seward Carol for continued management of fibromyalgia. We also spoke with patient in detail today about steroid injections and the risks associated with frequent injections.  Patient encouraged to continue home exercise program, exercises at the [facility], and participating in bowling league as tolerated.  No red flag symptoms noted upon exam today.   Meds & Orders: No orders of the defined types were placed in this encounter.   Orders Placed This Encounter  Procedures   Ambulatory referral to Physical Medicine Rehab     Follow-up: Return in about 1 week (around 02/28/2021) for Left intra-articular steroid hip injection.   Procedures: No procedures performed      Clinical History: CLINICAL DATA:  Chronic low back pain with left leg pain  for several years   EXAM: MRI LUMBAR SPINE WITHOUT CONTRAST   TECHNIQUE: Multiplanar, multisequence MR imaging of the lumbar spine was performed. No intravenous contrast was administered.   COMPARISON:  10/04/2009   FINDINGS: Segmentation:  Standard lumbar numbering   Alignment:  Mild anterolisthesis at L3-4 and L4-5.   Vertebrae:  No fracture, evidence of discitis, or bone lesion.   Conus medullaris and cauda equina: Conus extends to the L1-2 level. Conus and cauda equina appear normal.   Paraspinal and other soft tissues: Distended bladder which is trabeculated. Prominent atrophy of intrinsic back muscles. Partially covered sigmoid diverticulosis.   Disc levels:   T12- L1: Minor spondylosis and annulus bulging   L1-L2: Unremarkable for age   L2-L3: Mild facet spurring and annulus bulging.   L3-L4: Prominent facet osteoarthritis with spurring and effusions in the gaping joint spaces. Mild anterolisthesis. Mild disc narrowing and bulging. There is moderate spinal stenosis but no static nerve root compression.   L4-L5: Facet osteoarthritis with spurring and mild anterolisthesis. Mild disc bulging. No neural compression   L5-S1:Mild facet spurring and disc bulging.   IMPRESSION: 1. 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 2. 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 3. 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     Electronically Signed   By: Monte Fantasia M.D.   On: 12/05/2019 05:54.   She reports that she quit smoking about 25 years ago. Her smoking use included cigarettes. She has never used smokeless tobacco. No results for input(s): HGBA1C, LABURIC in the last 8760 hours.  Objective:  VS:  HT:    WT:   BMI:     BP:135/68  HR:96bpm  TEMP: ( )  RESP:  Physical Exam HENT:     Head: Normocephalic and atraumatic.      Right Ear: Tympanic membrane normal.     Left Ear: Tympanic membrane normal.     Nose: Nose normal.     Mouth/Throat:     Mouth: Mucous membranes are moist.  Eyes:     Pupils: Pupils are equal, round, and reactive to light.  Cardiovascular:     Rate and Rhythm: Normal rate.     Pulses: Normal pulses.  Pulmonary:     Effort: Pulmonary effort is normal.  Abdominal:     General: Abdomen is flat. There is no distension.  Musculoskeletal:        General: Tenderness present.     Cervical back: Normal range of motion.     Comments: Pt rises from seated position to standing without difficulty. Good lumbar range of motion. Strong distal strength without clonus, pain upon palpation of greater trochanters. Pain noted upon left hip flexion/extension/rotation. Sensation intact bilaterally. Walks independently, gait steady.     Skin:    General: Skin is warm and dry.     Capillary Refill: Capillary refill takes less than 2 seconds.  Neurological:     General: No focal deficit present.     Mental Status: She is alert.  Psychiatric:        Mood and Affect: Mood normal.    Ortho  Exam  Imaging: No results found.  Past Medical/Family/Surgical/Social History: Medications & Allergies reviewed per EMR, new medications updated. 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 05/03/2014   Pes anserinus bursitis of left knee 12/04/2013   Trochanteric bursitis of left hip 08/07/2013   Leg swelling  12/06/2011   Glaucoma 11/02/2011   HLD (hyperlipidemia) 11/02/2011   HTN (hypertension) 11/02/2011   Hypothyroid 11/02/2011   OA (osteoarthritis) 11/02/2011   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 ([place]) 03/16/2011   Presence of artificial knee joint 03/16/2011   Past Medical History:  Diagnosis Date   Glaucoma    High cholesterol    Hypertension    Lupus ([place])    Thyroid disease    Family History  Problem Relation Age of Onset   Cancer Father    Cancer Brother    Cancer Brother    Stroke Brother    Past Surgical History:  Procedure Laterality Date   ABDOMINAL HYSTERECTOMY     CATARACT EXTRACTION, BILATERAL     TOTAL KNEE ARTHROPLASTY Bilateral    TUBAL LIGATION     Social History   Occupational History   Not on file  Tobacco Use   Smoking status: Former    Types: Cigarettes    Quit date: 1997    Years since quitting: 25.7   Smokeless tobacco: Never  Vaping Use   Vaping Use: Never used  Substance and Sexual Activity   Alcohol use: Yes    Alcohol/week: 3.0 standard drinks    Types: 3 Glasses of wine per week    Comment: occ wine   Drug use: No   Sexual activity: Not on file

## 2021-02-21 NOTE — Progress Notes (Signed)
Pt state lower back pain that travels to both legs. Pt state any movement makes the pain worse. Pt state she takes pain meds to help ease her pain. Pt has hx of inj on 12/23/20 pt state it helped a little bit and didn't last long.  Numeric Pain Rating Scale and Functional Assessment Average Pain 8 Pain Right Now 6 My pain is intermittent and aching Pain is worse with: walking, bending, standing, and some activites Pain improves with: medication   In the last MONTH (on 0-10 scale) has pain interfered with the following?  1. General activity like being  able to carry out your everyday physical activities such as walking, climbing stairs, carrying groceries, or moving a chair?  Rating(6)  2. Relation with others like being able to carry out your usual social activities and roles such as  activities at home, at work and in your community. Rating(7)  3. Enjoyment of life such that you have  been bothered by emotional problems such as feeling anxious, depressed or irritable?  Rating(8)

## 2021-02-24 ENCOUNTER — Ambulatory Visit: Payer: Self-pay

## 2021-02-24 ENCOUNTER — Other Ambulatory Visit: Payer: Self-pay

## 2021-02-24 ENCOUNTER — Encounter: Payer: Self-pay | Admitting: Physical Medicine and Rehabilitation

## 2021-02-24 ENCOUNTER — Ambulatory Visit (INDEPENDENT_AMBULATORY_CARE_PROVIDER_SITE_OTHER): Payer: Medicare HMO | Admitting: Physical Medicine and Rehabilitation

## 2021-02-24 VITALS — BP 147/79 | HR 87

## 2021-02-24 DIAGNOSIS — R5383 Other fatigue: Secondary | ICD-10-CM | POA: Diagnosis not present

## 2021-02-24 DIAGNOSIS — M25552 Pain in left hip: Secondary | ICD-10-CM

## 2021-02-24 DIAGNOSIS — M150 Primary generalized (osteo)arthritis: Secondary | ICD-10-CM | POA: Diagnosis not present

## 2021-02-24 DIAGNOSIS — Z6830 Body mass index (BMI) 30.0-30.9, adult: Secondary | ICD-10-CM | POA: Diagnosis not present

## 2021-02-24 DIAGNOSIS — M329 Systemic lupus erythematosus, unspecified: Secondary | ICD-10-CM | POA: Diagnosis not present

## 2021-02-24 DIAGNOSIS — E559 Vitamin D deficiency, unspecified: Secondary | ICD-10-CM | POA: Diagnosis not present

## 2021-02-24 DIAGNOSIS — R21 Rash and other nonspecific skin eruption: Secondary | ICD-10-CM | POA: Diagnosis not present

## 2021-02-24 DIAGNOSIS — M797 Fibromyalgia: Secondary | ICD-10-CM | POA: Diagnosis not present

## 2021-02-24 DIAGNOSIS — H2011 Chronic iridocyclitis, right eye: Secondary | ICD-10-CM | POA: Diagnosis not present

## 2021-02-24 DIAGNOSIS — M5136 Other intervertebral disc degeneration, lumbar region: Secondary | ICD-10-CM | POA: Diagnosis not present

## 2021-02-24 NOTE — Progress Notes (Signed)
Barbara Patton - 80 y.o. female MRN [id number]  Date of birth: 09-24-1940  Office Visit Note: Visit Date: 02/24/2021 PCP: Seward Carol, MD Referred by: Seward Carol, MD  Subjective: Chief Complaint  Patient presents with   Lower Back - Pain   Left Leg - Pain   Right Leg - Pain   HPI:  Barbara Patton is a 80 y.o. female who comes in today at the request of Barnet Pall, FNP for planned Left anesthetic hip arthrogram with fluoroscopic guidance.  The patient has failed conservative care including home exercise, medications, time and activity modification.  This injection will be diagnostic and hopefully therapeutic.  Please see requesting physician notes for further details and justification.   ROS Otherwise per HPI.  Assessment & Plan: Visit Diagnoses:    ICD-10-CM   1. Pain in left hip  M25.552 XR C-ARM NO REPORT    Large Joint Inj: L hip joint      Plan: No additional findings.   Meds & Orders: No orders of the defined types were placed in this encounter.   Orders Placed This Encounter  Procedures   Large Joint Inj: L hip joint   XR C-ARM NO REPORT    Follow-up: Return if symptoms worsen or fail to improve.   Procedures: Large Joint Inj: L hip joint on 02/24/2021 3:54 PM Indications: diagnostic evaluation and pain Details: 22 G 3.5 in needle, fluoroscopy-guided anterior approach  Arthrogram: No  Medications: 4 mL bupivacaine 0.25 %; 60 mg triamcinolone acetonide 40 MG/ML Outcome: tolerated well, no immediate complications  There was excellent flow of contrast producing a partial arthrogram of the hip. The patient did have relief of symptoms during the anesthetic phase of the injection. Procedure, treatment alternatives, risks and benefits explained, specific risks discussed. Consent was given by the patient. Immediately prior to procedure a time out was called to verify the correct patient, procedure, equipment, support staff and site/side marked as required.  Patient was prepped and draped in the usual sterile fashion.         Clinical History: CLINICAL DATA:  Chronic low back pain with left leg pain for several years   EXAM: MRI LUMBAR SPINE WITHOUT CONTRAST   TECHNIQUE: Multiplanar, multisequence MR imaging of the lumbar spine was performed. No intravenous contrast was administered.   COMPARISON:  10/04/2009   FINDINGS: Segmentation:  Standard lumbar numbering   Alignment:  Mild anterolisthesis at L3-4 and L4-5.   Vertebrae:  No fracture, evidence of discitis, or bone lesion.   Conus medullaris and cauda equina: Conus extends to the L1-2 level. Conus and cauda equina appear normal.   Paraspinal and other soft tissues: Distended bladder which is trabeculated. Prominent atrophy of intrinsic back muscles. Partially covered sigmoid diverticulosis.   Disc levels:   T12- L1: Minor spondylosis and annulus bulging   L1-L2: Unremarkable for age   L2-L3: Mild facet spurring and annulus bulging.   L3-L4: Prominent facet osteoarthritis with spurring and effusions in the gaping joint spaces. Mild anterolisthesis. Mild disc narrowing and bulging. There is moderate spinal stenosis but no static nerve root compression.   L4-L5: Facet osteoarthritis with spurring and mild anterolisthesis. Mild disc bulging. No neural compression   L5-S1:Mild facet spurring and disc bulging.   IMPRESSION: 1. 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 2. 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 3. 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  Electronically Signed   By: Monte Fantasia M.D.   On: 12/05/2019 05:54.     Objective:  VS:  HT:    WT:   BMI:     BP:(!) 147/79  HR:87bpm  TEMP: ( )  RESP:  Physical Exam   Imaging: No results found.

## 2021-02-24 NOTE — Progress Notes (Signed)
Pt state lower back pain that travels to both legs. Pt state any movement makes the pain worse. Pt state she takes pain meds to help ease her pain.  Numeric Pain Rating Scale and Functional Assessment Average Pain 3   In the last MONTH (on 0-10 scale) has pain interfered with the following?  1. General activity like being  able to carry out your everyday physical activities such as walking, climbing stairs, carrying groceries, or moving a chair?  Rating(8)   +Driver, -BT, -Dye Allergies.

## 2021-02-24 NOTE — Patient Instructions (Signed)

## 2021-03-04 MED ORDER — BUPIVACAINE HCL 0.25 % IJ SOLN
4.0000 mL | INTRAMUSCULAR | Status: AC | PRN
  Administered 2021-02-24: 4 mL via INTRA_ARTICULAR

## 2021-03-04 MED ORDER — TRIAMCINOLONE ACETONIDE 40 MG/ML IJ SUSP
60.0000 mg | INTRAMUSCULAR | Status: AC | PRN
  Administered 2021-02-24: 60 mg via INTRA_ARTICULAR

## 2021-03-25 DIAGNOSIS — Z1231 Encounter for screening mammogram for malignant neoplasm of breast: Secondary | ICD-10-CM | POA: Diagnosis not present

## 2021-03-30 DIAGNOSIS — Z961 Presence of intraocular lens: Secondary | ICD-10-CM | POA: Diagnosis not present

## 2021-03-30 DIAGNOSIS — Z79899 Other long term (current) drug therapy: Secondary | ICD-10-CM | POA: Diagnosis not present

## 2021-03-30 DIAGNOSIS — H52203 Unspecified astigmatism, bilateral: Secondary | ICD-10-CM | POA: Diagnosis not present

## 2021-03-30 DIAGNOSIS — H401133 Primary open-angle glaucoma, bilateral, severe stage: Secondary | ICD-10-CM | POA: Diagnosis not present

## 2021-04-04 NOTE — Progress Notes (Signed)
Assessment/Plan:    1.  Hand paresthesias, L>R  -we will do EMG.  Seems like potentially what she describes is tremor is really dexterity issues of the left hand.  Decided to go ahead and proceed with EMG.  Depending on results of that, told her we could consider MRI of the cervical spine, as she does describe some neck discomfort, but want to hold on that to see what EMG reveals.  2.  Tremor  -Again, very little was noted on examination today.  No postural tremor.  No rest tremor.  No trouble with Archimedes spirals.  Subjective:   Barbara Patton was seen today in follow up for essential tremor.  My previous records were reviewed prior to todays visit.  Was seen about 2 months ago, at which point I thought she had essential tremor.  However, her tremor was very mild and I thought that the risks of taking medication outweighed the benefits, and she agreed.  She returns today stating that tremor is getting worse.  Its bothersome enough that she wants to explore medication.  She notes it when turning a page in a book.  She states that she has difficulty manipulating the pages in a book.  It is worse on the left than the right.  She also notes numbness of the L hand - she cannot state if its all the fingers or not.  She doesn't think that the L arm is numb but the wrist and arm are painful.      ALLERGIES:   Allergies  Allergen Reactions   Codeine Other (See Comments)    Kept awake   Penicillins Hives    Did it involve swelling of the face/tongue/throat, SOB, or low BP? N Did it involve sudden or severe rash/hives, skin peeling, or any reaction on the inside of your mouth or nose? Y Did you need to seek medical attention at a hospital or doctor's office? N When did it last happen?   Over 10 Years Ago    If all above answers are "NO", may proceed with cephalosporin use.     CURRENT MEDICATIONS:  Outpatient Encounter Medications as of 04/05/2021  Medication Sig   acetaminophen  (TYLENOL) 325 MG tablet Take 650 mg by mouth 2 (two) times daily as needed (pain).   betamethasone dipropionate (DIPROLENE) 0.05 % ointment Apply 1 application topically 2 (two) times daily as needed (dry skin irritation).    Cholecalciferol (VITAMIN D-3 PO) Take 1 tablet by mouth daily.   Coenzyme Q10 (CO Q 10 PO) Take 1 tablet by mouth daily.   diclofenac Sodium (VOLTAREN) 1 % GEL Add'l Sig Add'l Sig topical Add'l Sig   DULoxetine (CYMBALTA) 30 MG capsule    furosemide (LASIX) 40 MG tablet Take 40 mg by mouth daily.    hydrochlorothiazide (HYDRODIURIL) 25 MG tablet Take 25 mg by mouth daily.   hydroxychloroquine (PLAQUENIL) 200 MG tablet Take 200 mg by mouth daily.    hydrOXYzine (ATARAX/VISTARIL) 10 MG tablet Take 10 mg by mouth every 6 (six) hours as needed for itching or anxiety.    levothyroxine (SYNTHROID) 100 MCG tablet Take 100 mcg by mouth daily.   levothyroxine (SYNTHROID, LEVOTHROID) 112 MCG tablet Take 112 mcg by mouth daily.    methocarbamol (ROBAXIN) 500 MG tablet Take 1 tablet (500 mg total) by mouth 2 (two) times daily as needed.   Multiple Vitamins-Minerals (MULTIVITAMIN PO) Take 1 tablet by mouth daily.    omeprazole (PRILOSEC) 40 MG capsule  Take 40 mg by mouth daily.    simvastatin (ZOCOR) 20 MG tablet Take 20 mg by mouth every evening.    timolol (TIMOPTIC) 0.5 % ophthalmic solution Place 1 drop into both eyes 2 (two) times daily.   tolterodine (DETROL LA) 4 MG 24 hr capsule TAKE 1 CAPSULE BY MOUTH ONCE DAILY FOR 30 DAYS   traMADol (ULTRAM) 50 MG tablet Take 50 mg by mouth 2 (two) times daily as needed (pain).    [DISCONTINUED] Multiple Vitamin (MULTI-VITAMINS) TABS Take 1 tablet by mouth daily.    oxybutynin (DITROPAN) 5 MG tablet Take 5 mg by mouth 3 (three) times daily.    [DISCONTINUED] Ascorbic Acid (VITAMIN C PO) Take 1 tablet by mouth daily.  (Patient not taking: Reported on 04/05/2021)   [DISCONTINUED] estradiol (ESTRACE) 0.5 MG tablet    [DISCONTINUED] ibuprofen  (ADVIL,MOTRIN) 600 MG tablet TAKE 1 TABLET BY MOUTH THREE TIMES DAILY WITH MEALS FOR 14 DAYS (Patient not taking: Reported on 04/05/2021)   [DISCONTINUED] methylPREDNISolone (MEDROL) 4 MG tablet 6 Day taper, take as directed (Patient not taking: Reported on 04/05/2021)   [DISCONTINUED] Omega-3 Fatty Acids (FISH OIL PO) Take 1 capsule by mouth once a week.    [DISCONTINUED] phenazopyridine (PYRIDIUM) 200 MG tablet Take 1 tablet (200 mg total) by mouth 3 (three) times daily. (Patient not taking: Reported on 04/05/2021)   No facility-administered encounter medications on file as of 04/05/2021.     Objective:    PHYSICAL EXAMINATION:    VITALS:   Vitals:   04/05/21 1450  BP: 136/62  Pulse: 84  SpO2: 98%  Weight: 191 lb 6.4 oz (86.8 kg)  Height: 5\' 6"  (1.676 m)    GEN:  The patient appears stated age and is in NAD. HEENT:  Normocephalic, atraumatic.  The mucous membranes are moist. The superficial temporal arteries are without ropiness or tenderness. CV:  RRR Lungs:  CTAB Neck/HEME:  There are no carotid bruits bilaterally.  Neurological examination:  Orientation: The patient is alert and oriented x3. Cranial nerves: There is good facial symmetry. The speech is fluent and clear. Soft palate rises symmetrically and there is no tongue deviation. Hearing is intact to conversational tone. Sensation: Sensation is intact to light touch throughout Motor: Strength is at least antigravity x4.  Movement examination: Tone: There is normal tone in the bilateral upper extremities.  The tone in the lower extremities is normal.  Abnormal movements: There is no rest tremor, even with distraction procedures.  No postural tremor.  She has no intention tremor.  When she tries to separate pages from 1 another, she shows me some tremor of her thumbs. Coordination:  There is no decremation with RAM's, with any form of RAMS, including alternating supination and pronation of the forearm, hand opening and  closing, finger taps, heel taps and toe taps Gait and Station:Patient pushes off of the chair to arise. The patient is wide-based. I have reviewed and interpreted the following labs independently   Chemistry      Component Value Date/Time   NA 140 01/27/2014 1014   K 3.8 01/27/2014 1014   CL 102 01/27/2014 1014   CO2 29 01/27/2014 1014   BUN 15 01/27/2014 1014   CREATININE 0.73 01/27/2014 1014      Component Value Date/Time   CALCIUM 9.2 01/27/2014 1014   ALKPHOS 95 01/27/2014 1014   AST 28 01/27/2014 1014   ALT 37 (H) 01/27/2014 1014   BILITOT 0.5 01/27/2014 1014  Lab Results  Component Value Date   WBC 4.0 01/27/2014   HGB 12.2 01/27/2014   HCT 37.7 01/27/2014   MCV 97.4 01/27/2014   PLT 197 01/27/2014   No results found for: TSH   Chemistry      Component Value Date/Time   NA 140 01/27/2014 1014   K 3.8 01/27/2014 1014   CL 102 01/27/2014 1014   CO2 29 01/27/2014 1014   BUN 15 01/27/2014 1014   CREATININE 0.73 01/27/2014 1014      Component Value Date/Time   CALCIUM 9.2 01/27/2014 1014   ALKPHOS 95 01/27/2014 1014   AST 28 01/27/2014 1014   ALT 37 (H) 01/27/2014 1014   BILITOT 0.5 01/27/2014 1014      Cc:  Seward Carol, MD

## 2021-04-05 ENCOUNTER — Ambulatory Visit (INDEPENDENT_AMBULATORY_CARE_PROVIDER_SITE_OTHER): Payer: Medicare HMO | Admitting: Neurology

## 2021-04-05 ENCOUNTER — Other Ambulatory Visit: Payer: Self-pay

## 2021-04-05 ENCOUNTER — Encounter: Payer: Self-pay | Admitting: Neurology

## 2021-04-05 VITALS — BP 136/62 | HR 84 | Ht 66.0 in | Wt 191.4 lb

## 2021-04-05 DIAGNOSIS — R202 Paresthesia of skin: Secondary | ICD-10-CM | POA: Diagnosis not present

## 2021-04-06 ENCOUNTER — Other Ambulatory Visit: Payer: Self-pay

## 2021-04-06 DIAGNOSIS — R202 Paresthesia of skin: Secondary | ICD-10-CM

## 2021-05-03 ENCOUNTER — Ambulatory Visit (INDEPENDENT_AMBULATORY_CARE_PROVIDER_SITE_OTHER): Payer: Medicare HMO | Admitting: Neurology

## 2021-05-03 ENCOUNTER — Other Ambulatory Visit: Payer: Self-pay

## 2021-05-03 DIAGNOSIS — R202 Paresthesia of skin: Secondary | ICD-10-CM | POA: Diagnosis not present

## 2021-05-03 NOTE — Procedures (Signed)
[facility]  [address], [address]  [place], [place] [address] Tel: [phone] Fax:  [phone] Test Date:  05/03/2021  Patient: Barbara Patton DOB: Aug 08, 1940 Physician: Narda Amber, DO  Sex: Female Height: 5\' 6"  Ref Phys: Alonza Bogus, D.O.  ID#: [id number]   Technician:    Patient Complaints: This is a 80 year old female referred for evaluation of bilateral hand paresthesias.  NCV & EMG Findings: Extensive electrodiagnostic testing of the right upper extremity and additional studies of the left shows: Bilateral median, ulnar, and mixed palmar sensory responses are within normal limits. Bilateral median and ulnar motor responses are within normal limits. There is no evidence of active or chronic motor axonal loss changes affecting any of the tested muscles.  Motor unit configuration and recruitment pattern is within normal limits.  Impression: This is a normal study of the upper extremities.  In particular, there is no evidence of carpal tunnel syndrome or a cervical radiculopathy.   ___________________________ Narda Amber, DO    Nerve Conduction Studies Anti Sensory Summary Table   Stim Site NR Peak (ms) Norm Peak (ms) P-T Amp (V) Norm P-T Amp  Left Median Anti Sensory (2nd Digit)  Wrist    3.1 <3.8 28.1 >10  Right Median Anti Sensory (2nd Digit)  Wrist    3.0 <3.8 21.6 >10  Left Ulnar Anti Sensory (5th Digit)  Wrist    3.0 <3.2 30.4 >5  Right Ulnar Anti Sensory (5th Digit)  Wrist    3.0 <3.2 26.3 >5   Motor Summary Table   Stim Site NR Onset (ms) Norm Onset (ms) O-P Amp (mV) Norm O-P Amp Site1 Site2 Delta-0 (ms) Dist (cm) Vel (m/s) Norm Vel (m/s)  Left Median Motor (Abd Poll Brev)  Wrist    2.7 <4.0 9.6 >5 Elbow Wrist 5.6 28.0 50 >50  Elbow    8.3  8.9         Right Median Motor (Abd Poll Brev)  Wrist    2.7 <4.0 7.9 >5 Elbow Wrist 5.3 31.0 58 >50  Elbow    8.0  7.0         Left Ulnar Motor (Abd Dig Minimi)  Wrist    2.4 <3.1 8.0 >7 B Elbow  Wrist 3.7 23.0 62 >50  B Elbow    6.1  7.2  A Elbow B Elbow 1.5 10.0 67 >50  A Elbow    7.6  6.1         Right Ulnar Motor (Abd Dig Minimi)  Wrist    2.2 <3.1 9.0 >7 B Elbow Wrist 4.1 22.0 54 >50  B Elbow    6.3  7.6  A Elbow B Elbow 1.6 10.0 62 >50  A Elbow    7.9  6.9          Comparison Summary Table   Stim Site NR Peak (ms) Norm Peak (ms) P-T Amp (V) Site1 Site2 Delta-P (ms) Norm Delta (ms)  Left Median/Ulnar Palm Comparison (Wrist - 8cm)  Median Palm    1.8 <2.2 45.2 Median Palm Ulnar Palm 0.3   Ulnar Palm    1.5 <2.2 23.5      Right Median/Ulnar Palm Comparison (Wrist - 8cm)  Median Palm    1.8 <2.2 46.3 Median Palm Ulnar Palm 0.3   Ulnar Palm    1.5 <2.2 14.9       EMG   Side Muscle Ins Act Fibs Psw Fasc Number Recrt Dur Dur. Amp Amp. Poly Poly. Comment  Right 1stDorInt Nml Nml Nml Nml Nml Nml Nml Nml Nml Nml Nml Nml N/A  Right PronatorTeres Nml Nml Nml Nml Nml Nml Nml Nml Nml Nml Nml Nml N/A  Right Biceps Nml Nml Nml Nml Nml Nml Nml Nml Nml Nml Nml Nml N/A  Right Triceps Nml Nml Nml Nml Nml Nml Nml Nml Nml Nml Nml Nml N/A  Right Deltoid Nml Nml Nml Nml Nml Nml Nml Nml Nml Nml Nml Nml N/A  Left 1stDorInt Nml Nml Nml Nml Nml Nml Nml Nml Nml Nml Nml Nml N/A  Left PronatorTeres Nml Nml Nml Nml Nml Nml Nml Nml Nml Nml Nml Nml N/A  Left Biceps Nml Nml Nml Nml Nml Nml Nml Nml Nml Nml Nml Nml N/A  Left Triceps Nml Nml Nml Nml Nml Nml Nml Nml Nml Nml Nml Nml N/A  Left Deltoid Nml Nml Nml Nml Nml Nml Nml Nml Nml Nml Nml Nml N/A      Waveforms:

## 2021-05-04 ENCOUNTER — Telehealth: Payer: Self-pay | Admitting: Physical Medicine and Rehabilitation

## 2021-05-04 DIAGNOSIS — E7800 Pure hypercholesterolemia, unspecified: Secondary | ICD-10-CM | POA: Diagnosis not present

## 2021-05-04 DIAGNOSIS — R220 Localized swelling, mass and lump, head: Secondary | ICD-10-CM | POA: Diagnosis not present

## 2021-05-04 DIAGNOSIS — E663 Overweight: Secondary | ICD-10-CM | POA: Diagnosis not present

## 2021-05-04 DIAGNOSIS — I10 Essential (primary) hypertension: Secondary | ICD-10-CM | POA: Diagnosis not present

## 2021-05-04 DIAGNOSIS — I5189 Other ill-defined heart diseases: Secondary | ICD-10-CM | POA: Diagnosis not present

## 2021-05-04 DIAGNOSIS — E039 Hypothyroidism, unspecified: Secondary | ICD-10-CM | POA: Diagnosis not present

## 2021-05-04 NOTE — Telephone Encounter (Signed)
Pt called requesting a call back to set an appt. Please call pt at [phone].

## 2021-05-04 NOTE — Telephone Encounter (Signed)
IC no answer. LM for [name] to call us back. Not sure what type of appointment she is trying to schedule.

## 2021-05-05 ENCOUNTER — Other Ambulatory Visit: Payer: Self-pay

## 2021-05-05 DIAGNOSIS — M542 Cervicalgia: Secondary | ICD-10-CM

## 2021-05-11 DIAGNOSIS — M25571 Pain in right ankle and joints of right foot: Secondary | ICD-10-CM | POA: Diagnosis not present

## 2021-05-11 DIAGNOSIS — L600 Ingrowing nail: Secondary | ICD-10-CM | POA: Diagnosis not present

## 2021-05-11 DIAGNOSIS — M7731 Calcaneal spur, right foot: Secondary | ICD-10-CM | POA: Diagnosis not present

## 2021-05-11 DIAGNOSIS — M65871 Other synovitis and tenosynovitis, right ankle and foot: Secondary | ICD-10-CM | POA: Diagnosis not present

## 2021-05-13 DIAGNOSIS — N182 Chronic kidney disease, stage 2 (mild): Secondary | ICD-10-CM | POA: Diagnosis not present

## 2021-05-13 DIAGNOSIS — R338 Other retention of urine: Secondary | ICD-10-CM | POA: Diagnosis not present

## 2021-05-23 DIAGNOSIS — R0989 Other specified symptoms and signs involving the circulatory and respiratory systems: Secondary | ICD-10-CM | POA: Insufficient documentation

## 2021-05-23 DIAGNOSIS — Z87891 Personal history of nicotine dependence: Secondary | ICD-10-CM | POA: Diagnosis not present

## 2021-05-23 DIAGNOSIS — R490 Dysphonia: Secondary | ICD-10-CM | POA: Diagnosis not present

## 2021-05-23 DIAGNOSIS — J384 Edema of larynx: Secondary | ICD-10-CM | POA: Diagnosis not present

## 2021-05-25 DIAGNOSIS — L600 Ingrowing nail: Secondary | ICD-10-CM | POA: Diagnosis not present

## 2021-05-27 ENCOUNTER — Other Ambulatory Visit: Payer: Medicare HMO

## 2021-05-30 ENCOUNTER — Ambulatory Visit
Admission: RE | Admit: 2021-05-30 | Discharge: 2021-05-30 | Disposition: A | Discharge status: Home / Self Care | Payer: Medicare HMO | Source: Ambulatory Visit | Attending: Neurology | Admitting: Neurology

## 2021-05-30 DIAGNOSIS — M542 Cervicalgia: Secondary | ICD-10-CM | POA: Diagnosis not present

## 2021-05-30 DIAGNOSIS — M2578 Osteophyte, vertebrae: Secondary | ICD-10-CM | POA: Diagnosis not present

## 2021-05-31 ENCOUNTER — Telehealth: Payer: Self-pay

## 2021-05-31 NOTE — Telephone Encounter (Signed)
Called patient and provided her with her Cervical MRI results. Patient wanted to speak to Dr. Carles Collet about her tremors. Patient stated that it hasn't gotten worse nor has it gotten better. Patient asked if the MRI was for that because that was what was bothering her. I informed patient that the MRI Cervical spine was to check for numbness in her hands/fingers and not for her tremors. Patient is requesting me send a message to Dr. Carles Collet to ask about her tremors. I informed patient that I would send a message and give her a call once I hear back.

## 2021-06-01 ENCOUNTER — Telehealth: Payer: Self-pay | Admitting: Physical Medicine and Rehabilitation

## 2021-06-01 ENCOUNTER — Other Ambulatory Visit: Payer: Self-pay

## 2021-06-01 DIAGNOSIS — M542 Cervicalgia: Secondary | ICD-10-CM

## 2021-06-01 DIAGNOSIS — R202 Paresthesia of skin: Secondary | ICD-10-CM

## 2021-06-01 DIAGNOSIS — M5020 Other cervical disc displacement, unspecified cervical region: Secondary | ICD-10-CM

## 2021-06-01 NOTE — Progress Notes (Signed)
Let pt know that she does have some bulging discs in neck (mild) but for the most part that hasn't changed since her scan of 2017 and doubtful cause of arm issue.  We can refer her to neurosx if she wants but doubtful surgical in nature.  She will also need to f/u with PCP as we haven't seen neuro cause for her arm/hand pain.

## 2021-06-01 NOTE — Telephone Encounter (Signed)
Called patient back and informed her per Dr. Tomi Likens that She may have a mild essential tremor which is benign.  If it is unchanged, then that is good., because from what Dr. Tomi Likens read in her note, it is very mild so we don't treat it unless it is affecting her quality of life.   Patient stated that when she picks somethings up she drops it due to her tremor. Patient stated that she will f/u with Dr. Carles Collet if her tremors become worse.   Patient had no further questions or concerns.

## 2021-06-01 NOTE — Telephone Encounter (Signed)
Pt called stating her hip has been giving her pain for the past 3-4 weeks and this morning it was even worse. Pt states she has gotten injections in her hip before and would like to know if she can be worked in for one today? Pt would like a CB please.   [phone]

## 2021-06-10 ENCOUNTER — Encounter: Payer: Self-pay | Admitting: Surgical

## 2021-06-10 ENCOUNTER — Ambulatory Visit: Payer: Self-pay

## 2021-06-10 ENCOUNTER — Other Ambulatory Visit: Payer: Self-pay

## 2021-06-10 ENCOUNTER — Ambulatory Visit (INDEPENDENT_AMBULATORY_CARE_PROVIDER_SITE_OTHER): Payer: Medicare PPO | Admitting: Surgical

## 2021-06-10 DIAGNOSIS — M79605 Pain in left leg: Secondary | ICD-10-CM

## 2021-06-10 DIAGNOSIS — M1612 Unilateral primary osteoarthritis, left hip: Secondary | ICD-10-CM

## 2021-06-10 DIAGNOSIS — M25552 Pain in left hip: Secondary | ICD-10-CM | POA: Diagnosis not present

## 2021-06-10 MED ORDER — LIDOCAINE HCL 1 % IJ SOLN
5.0000 mL | INTRAMUSCULAR | Status: AC | PRN
  Administered 2021-06-10: 5 mL

## 2021-06-10 MED ORDER — METHYLPREDNISOLONE ACETATE 40 MG/ML IJ SUSP
40.0000 mg | INTRAMUSCULAR | Status: AC | PRN
  Administered 2021-06-10: 40 mg via INTRA_ARTICULAR

## 2021-06-10 MED ORDER — BUPIVACAINE HCL 0.25 % IJ SOLN
4.0000 mL | INTRAMUSCULAR | Status: AC | PRN
  Administered 2021-06-10: 4 mL via INTRA_ARTICULAR

## 2021-06-10 NOTE — Progress Notes (Signed)
Office Visit Note   Patient: Barbara Patton           Date of Birth: Mar 19, 1941           MRN: [id number] Visit Date: 06/10/2021 Requested by: Seward Carol, MD [address] [place],  [place] [address] PCP: Seward Carol, MD  Subjective: Chief Complaint  Patient presents with   Left Hip - Pain    HPI: Barbara Patton is a 81 y.o. female who presents to the office complaining of left hip, buttock pain.  Patient complains of primarily lateral left hip pain as well as buttock pain.  Also notes groin pain with ambulation.  No significant right-sided symptoms.  Pain is worse in the morning when she first gets up.  She is currently using CBD cream and Voltaren topically.  Denies any radicular pain down the leg.  Also notes back pain.  Has had previous ESI's by Dr. Ernestina Patches with good relief.  Also has had previous hip injection intra-articularly by Dr. Ernestina Patches with good relief of her hip pain..                ROS: All systems reviewed are negative as they relate to the chief complaint within the history of present illness.  Patient denies fevers or chills.  Assessment & Plan: Visit Diagnoses:  1. Unilateral primary osteoarthritis, left hip   2. Pain in left hip   3. Greater trochanteric pain syndrome of left lower extremity     Plan: Patient is an 81 year old female who presents for evaluation of left hip pain.  She has history of good relief of hip pain from intra-articular hip injection by Dr. Ernestina Patches in the past with last injection in September.  She also has focal tenderness to the greater trochanter of the left hip.  Discussed options available to patient and she request intra-articular left hip injection with greater trochanter injection of the left hip which has given her good relief in the past.  She tolerated greater trochanteric injection well which was done under ultrasound guidance.  Offered to refer her to Dr. Ernestina Patches for intra-articular hip injection but she states that  he is booked out until February and her pain is to the point where she does not want to wait this long.  Left hip intra-articular injection was administered under ultrasound guidance.  Patient tolerated the procedure well.  Plan to refer her to Dr. Ernestina Patches for lumbar spine ESI's as she has had in the past.  Follow-Up Instructions: No follow-ups on file.   Orders:  Orders Placed This Encounter  Procedures   US Guided Needle Placement - No Linked Charges   No orders of the defined types were placed in this encounter.     Procedures: Large Joint Inj: L greater trochanter on 06/10/2021 4:43 PM Indications: pain and diagnostic evaluation Details: 18 G 3.5 in needle, ultrasound-guided lateral approach  Arthrogram: No  Medications: 5 mL lidocaine 1 %; 4 mL bupivacaine 0.25 %; 40 mg methylPREDNISolone acetate 40 MG/ML Outcome: tolerated well, no immediate complications Procedure, treatment alternatives, risks and benefits explained, specific risks discussed. Consent was given by the patient. Immediately prior to procedure a time out was called to verify the correct patient, procedure, equipment, support staff and site/side marked as required. Patient was prepped and draped in the usual sterile fashion.    Large Joint Inj: L hip joint on 06/10/2021 4:44 PM Indications: pain and diagnostic evaluation Details: 18 G 3.5 in needle, ultrasound-guided  anterior approach  Arthrogram: No  Medications: 5 mL lidocaine 1 %; 4 mL bupivacaine 0.25 %; 40 mg methylPREDNISolone acetate 40 MG/ML Outcome: tolerated well, no immediate complications Procedure, treatment alternatives, risks and benefits explained, specific risks discussed. Consent was given by the patient. Immediately prior to procedure a time out was called to verify the correct patient, procedure, equipment, support staff and site/side marked as required. Patient was prepped and draped in the usual sterile fashion.      Clinical Data: No  additional findings.  Objective: Vital Signs: There were no vitals taken for this visit.  Physical Exam:  Constitutional: Patient appears well-developed HEENT:  Head: Normocephalic Eyes:EOM are normal Neck: Normal range of motion Cardiovascular: Normal rate Pulmonary/chest: Effort normal Neurologic: Patient is alert Skin: Skin is warm Psychiatric: Patient has normal mood and affect  Ortho Exam: Ortho exam demonstrates left hip with negative straight leg raise.  Positive Stinchfield sign.  Mild pain with internal rotation and external rotation of the left hip.  Tenderness moderately over the greater trochanter.  Increased pain with resisted hip AB duction that patient localizes to the greater trochanter.  Specialty Comments:  No specialty comments available.  Imaging: No results found.   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 05/03/2014   Pes anserinus bursitis of left knee 12/04/2013   Trochanteric bursitis of left hip 08/07/2013   Leg swelling 12/06/2011   Glaucoma 11/02/2011   HLD (hyperlipidemia) 11/02/2011   HTN (hypertension) 11/02/2011   Hypothyroid 11/02/2011   OA (osteoarthritis) 11/02/2011   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 ([place]) 03/16/2011   Presence of artificial knee joint 03/16/2011   Past Medical History:  Diagnosis Date   Glaucoma    High cholesterol    Hypertension    Lupus ([place])    Thyroid disease     Family History   Problem Relation Age of Onset   Cancer Father    Cancer Brother    Cancer Brother    Stroke Brother     Past Surgical History:  Procedure Laterality Date   ABDOMINAL HYSTERECTOMY     CATARACT EXTRACTION, BILATERAL     TOTAL KNEE ARTHROPLASTY Bilateral    TUBAL LIGATION     Social History   Occupational History   Not on file  Tobacco Use   Smoking status: Former    Types: Cigarettes    Quit date: 1997    Years since quitting: 26.0   Smokeless tobacco: Never  Vaping Use   Vaping Use: Never used  Substance and Sexual Activity   Alcohol use: Yes    Alcohol/week: 3.0 standard drinks    Types: 3 Glasses of wine per week    Comment: occ wine   Drug use: No   Sexual activity: Not on file

## 2021-06-10 NOTE — Addendum Note (Signed)
Addended byLaurann Montana on: 06/10/2021 04:51 PM   Modules accepted: Orders

## 2021-06-14 NOTE — Therapy (Signed)
OUTPATIENT PHYSICAL THERAPY CERVICAL EVALUATION   Patient Name: Barbara Patton MRN: [id number] DOB:[birth year], 81 y.o., female Today's Date: 06/15/2021   PT End of Session - 06/15/21 1227     Visit Number 1    Number of Visits 6    Date for PT Re-Evaluation 07/27/21    Authorization Type U[employer] / [employer]    Progress Note Due on Visit 10    PT Start Time 1215    PT Stop Time 1300    PT Time Calculation (min) 45 min    Activity Tolerance Patient tolerated treatment well    Behavior During Therapy [facility] for tasks assessed/performed             Past Medical History:  Diagnosis Date   Glaucoma    High cholesterol    Hypertension    Lupus ([place])    Thyroid disease    Past Surgical History:  Procedure Laterality Date   ABDOMINAL HYSTERECTOMY     CATARACT EXTRACTION, BILATERAL     TOTAL KNEE ARTHROPLASTY Bilateral    TUBAL LIGATION     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 05/03/2014   Pes anserinus bursitis of left knee 12/04/2013   Trochanteric bursitis of left hip 08/07/2013   Leg swelling 12/06/2011   Glaucoma 11/02/2011   HLD (hyperlipidemia) 11/02/2011   HTN (hypertension) 11/02/2011   Hypothyroid 11/02/2011   OA (osteoarthritis) 11/02/2011   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 ([place]) 03/16/2011   Presence of artificial knee joint 03/16/2011    PCP: Seward Carol, MD  REFERRING PROVIDER: Ludwig Clarks, DO  REFERRING DIAG: Neck pain  THERAPY DIAG:   Cervicalgia  Muscle weakness (generalized)  Abnormal posture  ONSET DATE: ongoing for greater than 1 year   SUBJECTIVE:                                                                                                                                                                                                        SUBJECTIVE STATEMENT: Patient reports neck pain and crunching when she turns her head, and  also reports her hands are shaking. Her neck has been hurting her for a long time, while the hand shaking has been happening for about a year. No apparent mechanism for either. Denies any radiating/referred pain, sensation changes.   Patient also reports she has pain "everywhere."   PERTINENT HISTORY:  Fibromyalgia  PAIN:  Are you having pain? Yes VAS scale: 8 / 10 Pain location: Neck Pain orientation: Right and Left  PAIN TYPE: Chronic Pain description: intermittent, aching, and sore   Aggravating factors: Turning head Relieving factors: CBD cream  PRECAUTIONS: None  WEIGHT BEARING RESTRICTIONS No  FALLS:  Has patient fallen in last 6 months? No  OCCUPATION: Retired  PLOF: Independent  PATIENT GOALS: Pain relief   OBJECTIVE:  DIAGNOSTIC FINDINGS:  MRI 05/30/2021  PATIENT SURVEYS:  FOTO 58% functional status  COGNITION: Overall cognitive status: Within functional limits for tasks assessed   SENSATION: Light touch: Appears intact  POSTURE:  Rounded shoulder and forward head posture  PALPATION: Tender to bilateral upper trap and cervical paraspinal region   CERVICAL AROM  AROM A/PROM (deg) 06/15/2021  Flexion 50  Extension 30  Right lateral flexion 20  Left lateral flexion 20  Right rotation 50  Left rotation 50   UE AROM/PROM:  Patient demonstrates BUE AROM grossly WFL  UE MMT:  MMT Right 06/15/2021 Left 06/15/2021  Shoulder flexion 4 4  Shoulder extension 4 4  Shoulder abduction 4 4  Shoulder ER 4 4  Elbow flexion 5 5  Elbow  extension 5 5  Wrist flexion 5 5  Wrist extension 5 5  Grip strength 35 20   CERVICAL SPECIAL TESTS:  Not assessed   TODAY'S TREATMENT:  Supine cervical retraction 10 x 5 sec Supine cervical rotation 10 x 5 sec each Seated upper trap stretch 2 x 15 sec each Seated shoulder blade squeezes 10 x 5 sec   PATIENT EDUCATION:  Education details: Exam findings, POC, HEP Person educated: Patient Education method: Explanation, Demonstration, Tactile cues, Verbal cues, and Handouts Education comprehension: verbalized understanding, returned demonstration, verbal cues required, tactile cues required, and needs further education  HOME EXERCISE PROGRAM: Access Code: [email or web address]   ASSESSMENT: CLINICAL IMPRESSION: Patient is a 81 y.o. female who was seen today for physical therapy evaluation and treatment for chronic neck pain and report of hand shaking. Her neck pain seems mostly musculoskeletal in nature without any specific radicular complaints, unclear etiology of patients report of hand shaking. Objective impairments include decreased ROM, decreased strength, impaired flexibility, postural dysfunction, and pain. These impairments are limiting patient from cleaning, community activity, driving, meal prep, and shopping. Personal factors including Age, Fitness, Past/current experiences, and Time since onset of injury/illness/exacerbation are also affecting patient's functional outcome. Patient will benefit from skilled PT to address above impairments and improve overall function.  REHAB POTENTIAL: Good  CLINICAL DECISION MAKING: Stable/uncomplicated  EVALUATION COMPLEXITY: Low   GOALS: Goals reviewed with patient? Yes  SHORT TERM GOALS:  STG Name Target Date Goal status  1 Patient will be I with initial HEP in order to progress with therapy. Baseline: provided at eval 07/06/2021 INITIAL  2 PT will review FOTO with patient by 3rd visit in order to understand expected progress and outcome  with therapy. Baseline: assessed at eval 07/06/2021 INITIAL   LONG TERM GOALS:   LTG Name Target Date Goal status  1 Patient will be I with final HEP to maintain progress from PT. Baseline: provided at eval 07/27/2021 INITIAL  2 Patient will report >/=  64% status on FOTO to indicate improved functional ability.  Baseline: 58% 07/27/2021 INITIAL  3 Patient will improve cervical rotation > 10 deg bilaterally to improve driving ability Baseline: 50 deg cervical rotation bilat 07/27/2021 INITIAL  4 Patient will report neck pain level </= 4/10 to reduce functional limitations Baseline: 8/10 07/27/2021 INITIAL    PLAN: PT FREQUENCY: 1x/week  PT DURATION: 6 weeks  PLANNED INTERVENTIONS: Therapeutic exercises, Therapeutic activity, Neuro Muscular re-education, Balance training, Gait training, Patient/Family education, Joint mobilization, Dry Needling, Electrical stimulation, Spinal mobilization, Cryotherapy, Moist heat, Traction, and Manual therapy  PLAN FOR NEXT SESSION: Review HEP and progress PRN, manual/dry needling for cervical, trial SNAGs, progress postural control   Hilda Blades, PT, DPT, LAT, ATC 06/15/21  1:55 PM Phone: [phone] Fax: [phone]    Referring diagnosis? M54.2 Treatment diagnosis? (if different than referring diagnosis) M54.2 What was this (referring dx) caused by? []  Surgery []  Fall [x]  Ongoing issue []  Arthritis []  Other: ____________  Laterality: []  Rt []  Lt [x]  Both  Check all possible CPT codes:  *CHOOSE 10 OR LESS*    []  97110 (Therapeutic Exercise)  []  92507 (SLP Treatment)  []  97112 (Neuro Re-ed)   []  92526 (Swallowing Treatment)   []  97116 (Gait Training)   []  [id number] (Cognitive Training, 1st 15 minutes) []  97140 (Manual Therapy)   []  97130 (Cognitive Training, each add'l 15 minutes)  []  97530 (Therapeutic Activities)  []  Other, List CPT Code ____________    []  94709 (Self Care)       [x]  All codes above (97110 - 97535)  [x]  97012  (Mechanical Traction)  [x]  97014 (E-stim Unattended)  []  97032 (E-stim manual)  []  97033 (Ionto)  []  97035 (Ultrasound)  []  97760 (Orthotic Fit) []  [id number] (Physical Performance Training) []  [id number] (Aquatic Therapy) []  97034 (Contrast Bath) []  [id number] (Paraffin) []  97597 (Wound Care 1st 20 sq cm) []  97598 (Wound Care each add'l 20 sq cm) []  97016 (Vasopneumatic Device) []  [id number] (Orthotic Training) []  [id number] (Prosthetic Training)

## 2021-06-15 ENCOUNTER — Encounter: Payer: Self-pay | Admitting: Physical Therapy

## 2021-06-15 ENCOUNTER — Other Ambulatory Visit: Payer: Self-pay

## 2021-06-15 ENCOUNTER — Ambulatory Visit: Payer: Medicare PPO | Attending: Neurology | Admitting: Physical Therapy

## 2021-06-15 DIAGNOSIS — R293 Abnormal posture: Secondary | ICD-10-CM | POA: Insufficient documentation

## 2021-06-15 DIAGNOSIS — M5020 Other cervical disc displacement, unspecified cervical region: Secondary | ICD-10-CM | POA: Diagnosis not present

## 2021-06-15 DIAGNOSIS — M6281 Muscle weakness (generalized): Secondary | ICD-10-CM | POA: Insufficient documentation

## 2021-06-15 DIAGNOSIS — R202 Paresthesia of skin: Secondary | ICD-10-CM | POA: Insufficient documentation

## 2021-06-15 DIAGNOSIS — M542 Cervicalgia: Secondary | ICD-10-CM | POA: Diagnosis not present

## 2021-06-15 NOTE — Patient Instructions (Signed)
Access Code: [email or web address] URL: [email or web address] Date: 06/15/2021 Prepared by: Hilda Blades  Exercises Supine Cervical Retraction with Towel - 2 x daily - 7 x weekly - 10 reps - 5 seconds hold Supine Cervical Rotation AROM on Pillow - 2 x daily - 7 x weekly - 10 reps - 5 seconds hold Gentle Upper Trap Stretch - 2 x daily - 7 x weekly - 3 reps - 15 seconds hold Seated Scapular Retraction - 2 x daily - 7 x weekly - 10 reps - 5 seconds hold

## 2021-06-28 ENCOUNTER — Ambulatory Visit (INDEPENDENT_AMBULATORY_CARE_PROVIDER_SITE_OTHER): Payer: Medicare PPO | Admitting: Physical Medicine and Rehabilitation

## 2021-06-28 ENCOUNTER — Other Ambulatory Visit: Payer: Self-pay

## 2021-06-28 ENCOUNTER — Encounter: Payer: Self-pay | Admitting: Physical Medicine and Rehabilitation

## 2021-06-28 ENCOUNTER — Ambulatory Visit: Payer: Self-pay

## 2021-06-28 VITALS — BP 169/81 | HR 76

## 2021-06-28 DIAGNOSIS — M5416 Radiculopathy, lumbar region: Secondary | ICD-10-CM | POA: Diagnosis not present

## 2021-06-28 MED ORDER — METHYLPREDNISOLONE ACETATE 80 MG/ML IJ SUSP
80.0000 mg | Freq: Once | INTRAMUSCULAR | Status: AC
  Administered 2021-06-28: 80 mg

## 2021-06-28 NOTE — Therapy (Signed)
OUTPATIENT PHYSICAL THERAPY TREATMENT NOTE   Patient Name: Barbara Patton MRN: [id number] DOB:07-08-1940, 81 y.o., female Today's Date: 06/29/2021  PCP: Seward Carol, MD REFERRING PROVIDER: Ludwig Clarks, DO   PT End of Session - 06/29/21 1327     Visit Number 2    Number of Visits 6    Date for PT Re-Evaluation 07/27/21    Authorization Type [employer] / [employer]    Progress Note Due on Visit 10    PT Start Time 1215    PT Stop Time 1300    PT Time Calculation (min) 45 min    Activity Tolerance Patient tolerated treatment well    Behavior During Therapy WFL for tasks assessed/performed             Past Medical History:  Diagnosis Date   Glaucoma    High cholesterol    Hypertension    Lupus ([place])    Thyroid disease    Past Surgical History:  Procedure Laterality Date   ABDOMINAL HYSTERECTOMY     CATARACT EXTRACTION, BILATERAL     TOTAL KNEE ARTHROPLASTY Bilateral    TUBAL LIGATION     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 05/03/2014   Pes anserinus bursitis of left knee 12/04/2013   Trochanteric bursitis of left hip 08/07/2013   Leg swelling 12/06/2011   Glaucoma 11/02/2011   HLD (hyperlipidemia) 11/02/2011   HTN (hypertension) 11/02/2011   Hypothyroid 11/02/2011   OA (osteoarthritis) 11/02/2011   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 States it is tolerable. She reports she thinks the neck pain is from her arthritis and fibromyalgia.  PAIN:  Are you having pain? Yes VAS scale: 5 / 10 Pain location: Neck Pain orientation: Right and Left  PAIN TYPE: Chronic Pain description: intermittent, aching, and sore   Aggravating factors: Turning head Relieving factors: CBD cream  PATIENT GOALS: Pain relief   OBJECTIVE: (BOLDED MEASURES ASSESSED THIS VISIT) PATIENT SURVEYS:  FOTO 58% functional status   CERVICAL AROM   AROM A/PROM (deg) 06/15/2021   06/29/2021 (post tx)  Flexion 50   Extension 30   Right lateral flexion 20   Left lateral flexion 20   Right rotation 50 60  Left rotation 50 65   UE MMT:   MMT Right 06/15/2021 Left 06/15/2021  Shoulder flexion 4 4  Shoulder extension 4 4  Shoulder abduction 4 4  Shoulder ER 4 4  Elbow flexion 5 5  Elbow extension 5 5  Wrist flexion 5 5  Wrist extension 5 5  Grip strength 35 20  TODAY'S TREATMENT:  06/29/2021 Therapeutic Exercise: UBE L1 x 4 min (2 fwd/bwd) Upper trap stretch 2 x 20 sec each Levator stretch 2 x 20 each Seated chin tuck 10 x 5 sec - cueing require for proper technique Row with green 2 x 10 - cued to avoid shrug Shoulder extension + scap retraction with red 2 x 10 Manual Therapy: Skilled palpation and monitoring of muscle tension throughout dry needling treatment STM for bilateral upper trap region Suboccipital release with gentle manual traction Trigger Point Dry Needling Treatment: Pre-treatment instruction: Patient instructed on dry needling rationale, procedures, and possible side effects including pain during  treatment (achy,cramping feeling), bruising, drop of blood, lightheadedness, nausea, sweating. Patient Consent Given: Yes Education handout provided: No Muscles treated: Bilateral upper trap and right infraspinatus  Needle size and number: .30x41mm x 3 Electrical stimulation performed: No Parameters: N/A Treatment response/outcome: Twitch response elicited, Palpable decrease in muscle tension, and patient report of reduced tightness with neck motion Post-treatment instructions: Patient instructed to expect possible mild to moderate muscle soreness later today and/or tomorrow. Patient instructed in methods to reduce muscle soreness and to continue prescribed HEP. If patient was dry needled over the lung field, patient was instructed on signs and symptoms of pneumothorax and, however unlikely, to see immediate medical attention should they occur. Patient was also educated on signs and symptoms of infection and to seek medical attention should they occur. Patient verbalized understanding of these instructions and education.   06/15/2021: Supine cervical retraction 10 x 5 sec Supine cervical rotation 10 x 5 sec each Seated upper trap stretch 2 x 15 sec each Seated shoulder blade squeezes 10 x 5 sec     PATIENT EDUCATION:  Education details: HEP, TPDN Person educated: Patient Education method: Explanation, Demonstration, Tactile cues, Verbal cues Education comprehension: verbalized understanding, returned demonstration, verbal cues required, tactile cues required, and needs further education   HOME EXERCISE PROGRAM: Access Code: [email or web address]     ASSESSMENT: CLINICAL IMPRESSION: Patient tolerated therapy well with no adverse effects. Patient continued to report tightness with cervical motion so performed TPDN to bilateral upper traps with good response and patient reporting reduced pain and crunching post treatment, as well as improved motion compared to previous assessment. Continued working on  postural control and endurance, progressing to banded resistance exercises. Patient required extensive cueing for proper seated chin tuck exercises and to avoid shoulder shug to reduce upper trap tension. No changes made to HEP this visit. Patient would benefit from continued skilled PT to reduce pain and progress postural control in order to maximize functional ability.     GOALS: Goals reviewed with patient? Yes   SHORT TERM GOALS:   STG Name Target Date Goal status  1 Patient will be I with initial HEP in order to progress with therapy. Baseline: provided at eval 07/06/2021 INITIAL  2 PT will review FOTO with patient by 3rd visit in order to understand expected progress and outcome with therapy. Baseline: assessed at eval 07/06/2021 INITIAL    LONG TERM GOALS:    LTG Name Target Date Goal status  1 Patient will be I with final HEP to maintain progress from PT. Baseline: provided at eval 07/27/2021 INITIAL  2 Patient will report >/= 64% status on FOTO to indicate improved functional ability.  Baseline: 58% 07/27/2021 INITIAL  3 Patient will improve cervical rotation > 10 deg bilaterally to improve driving ability Baseline: 50 deg cervical rotation bilat 07/27/2021 INITIAL  4 Patient will report neck pain level </=  4/10 to reduce functional limitations Baseline: 8/10 07/27/2021 INITIAL      PLAN: PT FREQUENCY: 1x/week   PT DURATION: 6 weeks   PLANNED INTERVENTIONS: Therapeutic exercises, Therapeutic activity, Neuro Muscular re-education, Balance training, Gait training, Patient/Family education, Joint mobilization, Dry Needling, Electrical stimulation, Spinal mobilization, Cryotherapy, Moist heat, Traction, and Manual therapy   PLAN FOR NEXT SESSION: Review HEP and progress PRN, manual/dry needling for cervical, trial SNAGs, progress postural control    Hilda Blades, PT, DPT, LAT, ATC 06/29/21  1:34 PM Phone: [phone] Fax: [phone]

## 2021-06-28 NOTE — Progress Notes (Signed)
Pt state lower back pain that travels left hip. Pt state getting up out the bed pt feelings pain. Pt state she takes pain meds to help ease her pain.  Numeric Pain Rating Scale and Functional Assessment Average Pain 5   In the last MONTH (on 0-10 scale) has pain interfered with the following?  1. General activity like being  able to carry out your everyday physical activities such as walking, climbing stairs, carrying groceries, or moving a chair?  Rating(10)   +Driver, -BT, -Dye Allergies.

## 2021-06-28 NOTE — Patient Instructions (Signed)

## 2021-06-29 ENCOUNTER — Other Ambulatory Visit: Payer: Self-pay

## 2021-06-29 ENCOUNTER — Encounter: Payer: Self-pay | Admitting: Physical Therapy

## 2021-06-29 ENCOUNTER — Ambulatory Visit: Payer: Medicare PPO | Admitting: Physical Therapy

## 2021-06-29 DIAGNOSIS — M6281 Muscle weakness (generalized): Secondary | ICD-10-CM

## 2021-06-29 DIAGNOSIS — R202 Paresthesia of skin: Secondary | ICD-10-CM | POA: Diagnosis not present

## 2021-06-29 DIAGNOSIS — M5020 Other cervical disc displacement, unspecified cervical region: Secondary | ICD-10-CM | POA: Diagnosis not present

## 2021-06-29 DIAGNOSIS — R293 Abnormal posture: Secondary | ICD-10-CM

## 2021-06-29 DIAGNOSIS — M542 Cervicalgia: Secondary | ICD-10-CM | POA: Diagnosis not present

## 2021-07-05 NOTE — Therapy (Signed)
OUTPATIENT PHYSICAL THERAPY TREATMENT NOTE   Patient Name: Barbara Patton MRN: [id number] DOB:Oct 03, 1940, 81 y.o., female Today's Date: 07/06/2021  PCP: Seward Carol, MD REFERRING PROVIDER: Ludwig Clarks, DO   PT End of Session - 07/06/21 1222     Visit Number 3    Number of Visits 6    Date for PT Re-Evaluation 07/27/21    Authorization Type [employer] / [employer]    Authorization Time Period 06/15/2021 - 08/03/2021    Authorization - Visit Number 3    Authorization - Number of Visits 7    Progress Note Due on Visit 10    PT Start Time 7829    PT Stop Time 1300    PT Time Calculation (min) 40 min    Activity Tolerance Patient tolerated treatment well    Behavior During Therapy WFL for tasks assessed/performed              Past Medical History:  Diagnosis Date   Glaucoma    High cholesterol    Hypertension    Lupus ([place])    Thyroid disease    Past Surgical History:  Procedure Laterality Date   ABDOMINAL HYSTERECTOMY     CATARACT EXTRACTION, BILATERAL     TOTAL KNEE ARTHROPLASTY Bilateral    TUBAL LIGATION     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 05/03/2014   Pes anserinus bursitis of left knee 12/04/2013   Trochanteric bursitis of left hip 08/07/2013   Leg swelling 12/06/2011   Glaucoma 11/02/2011   HLD (hyperlipidemia) 11/02/2011   HTN (hypertension) 11/02/2011   Hypothyroid 11/02/2011   OA (osteoarthritis) 11/02/2011   Pain due to total left knee  replacement ([place]) 03/16/2011   Presence of artificial knee joint 03/16/2011    REFERRING PROVIDER: Ludwig Clarks, DO   REFERRING DIAG: Neck pain  THERAPY DIAG:  Cervicalgia  Muscle weakness (generalized)  Abnormal posture  ONSET DATE: ongoing for greater than 1 year  PERTINENT HISTORY: None  PRECAUTIONS: None WEIGHT BEARING RESTRICTIONS: No   SUBJECTIVE: Patient reports her neck is doing much better. Not really having much pain today.  PAIN:  Are you having pain? No VAS scale: 0 / 10 Pain location: Neck Pain orientation: Right and Left  PAIN TYPE: Chronic Pain description: intermittent, aching, and sore   Aggravating factors: Turning head Relieving factors: CBD cream  PATIENT GOALS: Pain relief   OBJECTIVE: (BOLDED MEASURES ASSESSED THIS VISIT) PATIENT SURVEYS:  FOTO 58% functional status   CERVICAL AROM   AROM A/PROM (deg) 06/15/2021   06/29/2021 (post tx)  Flexion 50   Extension 30   Right lateral flexion 20   Left lateral flexion 20   Right rotation 50 60  Left rotation 50 65   UE MMT:   MMT Right 06/15/2021 Left 06/15/2021 Right / Left 07/06/2021  Shoulder flexion 4 4   Shoulder extension 4 4   Shoulder abduction 4 4   Shoulder ER 4 4 4  / 4  Elbow flexion 5 5  Elbow extension 5 5   Wrist flexion 5 5   Wrist extension 5 5   Grip strength 35 20      TODAY'S TREATMENT:  07/06/2021: UBE L1 x 4 min (2 fwd/bwd) while taking subjective Corner chest stretch 3 x 20 sec Sidelying thoracic rotation x 10 each Row with green 3 x 15 Shoulder extension + scap retraction with green 2 x 15 Supine shoulder horizontal abduction with red 2 x 15 Supine diagonals with red 2 x 10 each Double ER + scap retraction with red 2 x 10   06/29/2021 Therapeutic Exercise: UBE L1 x 4 min (2 fwd/bwd) Upper trap stretch 2 x 20 sec each Levator stretch 2 x 20 each Seated chin tuck 10 x 5 sec - cueing require for proper technique Row with green 2 x 10 - cued to avoid  shrug Shoulder extension + scap retraction with red 2 x 10 Manual Therapy: Skilled palpation and monitoring of muscle tension throughout dry needling treatment STM for bilateral upper trap region Suboccipital release with gentle manual traction Trigger Point Dry Needling Treatment: Pre-treatment instruction: Patient instructed on dry needling rationale, procedures, and possible side effects including pain during treatment (achy,cramping feeling), bruising, drop of blood, lightheadedness, nausea, sweating. Patient Consent Given: Yes Education handout provided: No Muscles treated: Bilateral upper trap and right infraspinatus  Needle size and number: .30x53mm x 3 Electrical stimulation performed: No Parameters: N/A Treatment response/outcome: Twitch response elicited, Palpable decrease in muscle tension, and patient report of reduced tightness with neck motion Post-treatment instructions: Patient instructed to expect possible mild to moderate muscle soreness later today and/or tomorrow. Patient instructed in methods to reduce muscle soreness and to continue prescribed HEP. If patient was dry needled over the lung field, patient was instructed on signs and symptoms of pneumothorax and, however unlikely, to see immediate medical attention should they occur. Patient was also educated on signs and symptoms of infection and to seek medical attention should they occur. Patient verbalized understanding of these instructions and education.  06/15/2021 (eval): Supine cervical retraction 10 x 5 sec Supine cervical rotation 10 x 5 sec each Seated upper trap stretch 2 x 15 sec each Seated shoulder blade squeezes 10 x 5 sec    PATIENT EDUCATION:  Education details: HEP update, FOTO Person educated: Patient Education method: Explanation, Demonstration, Tactile cues, Verbal cues, Handout Education comprehension: verbalized understanding, returned demonstration, verbal cues required, tactile cues required,  and needs further education   HOME EXERCISE PROGRAM: Access Code: [email or web address]     ASSESSMENT: CLINICAL IMPRESSION: Patient tolerated therapy well with no adverse effects. Patient reporting feeling much improved following dry needling so this visit focused primarily on improving mobility and strength in order to progress her postural control. She continues to demonstrate postural deficits and gross weakness of posterior cuff/periscapular musculature. Updated HEP to progress postural strengthening at home. Patient would benefit from continued skilled PT to reduce pain and progress postural control in order to maximize functional ability.     GOALS: Goals reviewed with patient? Yes   SHORT TERM GOALS:   STG Name Target Date Goal status  1 Patient will be I with initial HEP in order to progress with therapy. Baseline: progressing 07/06/2021 ONGOING  2 PT will review FOTO with patient by 3rd visit in order to understand expected progress and outcome with therapy. Baseline: reviewed 3rd visit 07/06/2021 ACHIEVED    LONG TERM GOALS:    LTG Name Target Date Goal status  1 Patient will be  I with final HEP to maintain progress from PT. Baseline: provided at eval 07/27/2021 INITIAL  2 Patient will report >/= 64% status on FOTO to indicate improved functional ability.  Baseline: 58% 07/27/2021 INITIAL  3 Patient will improve cervical rotation > 10 deg bilaterally to improve driving ability Baseline: 50 deg cervical rotation bilat 07/27/2021 INITIAL  4 Patient will report neck pain level </= 4/10 to reduce functional limitations Baseline: 8/10 07/27/2021 INITIAL      PLAN: PT FREQUENCY: 1x/week   PT DURATION: 6 weeks   PLANNED INTERVENTIONS: Therapeutic exercises, Therapeutic activity, Neuro Muscular re-education, Balance training, Gait training, Patient/Family education, Joint mobilization, Dry Needling, Electrical stimulation, Spinal mobilization, Cryotherapy, Moist heat, Traction, and Manual  therapy   PLAN FOR NEXT SESSION: Review HEP and progress PRN, manual/dry needling for cervical, trial SNAGs, progress postural control    Hilda Blades, PT, DPT, LAT, ATC 07/06/21  1:38 PM Phone: [phone] Fax: [phone]

## 2021-07-06 ENCOUNTER — Ambulatory Visit: Payer: Medicare PPO | Attending: Neurology | Admitting: Physical Therapy

## 2021-07-06 ENCOUNTER — Encounter: Payer: Self-pay | Admitting: Physical Therapy

## 2021-07-06 ENCOUNTER — Other Ambulatory Visit: Payer: Self-pay

## 2021-07-06 DIAGNOSIS — M6281 Muscle weakness (generalized): Secondary | ICD-10-CM | POA: Insufficient documentation

## 2021-07-06 DIAGNOSIS — R293 Abnormal posture: Secondary | ICD-10-CM | POA: Diagnosis not present

## 2021-07-06 DIAGNOSIS — M542 Cervicalgia: Secondary | ICD-10-CM | POA: Diagnosis not present

## 2021-07-06 NOTE — Patient Instructions (Signed)
Access Code: [email or web address] URL: [email or web address] Date: 07/06/2021 Prepared by: Hilda Blades  Exercises Supine Cervical Retraction with Towel - 2 x daily - 7 x weekly - 10 reps - 5 seconds hold Supine Cervical Rotation AROM on Pillow - 2 x daily - 7 x weekly - 10 reps - 5 seconds hold Gentle Upper Trap Stretch - 2 x daily - 7 x weekly - 3 reps - 15 seconds hold Seated Scapular Retraction - 2 x daily - 7 x weekly - 10 reps - 5 seconds hold Standing Row with Anchored Resistance - 1 x daily - 7 x weekly - 3 sets - 15 reps

## 2021-07-12 NOTE — Procedures (Signed)
Lumbar Epidural Steroid Injection - Interlaminar Approach with Fluoroscopic Guidance  Patient: Barbara Patton      Date of Birth: April 24, 1941 MRN: [id number] PCP: Seward Carol, MD      Visit Date: 06/28/2021   Universal Protocol:     Consent Given By: the patient  Position: PRONE  Additional Comments: Vital signs were monitored before and after the procedure. Patient was prepped and draped in the usual sterile fashion. The correct patient, procedure, and site was verified.   Injection Procedure Details:   Procedure diagnoses: Lumbar radiculopathy [M54.16]   Meds Administered:  Meds ordered this encounter  Medications   methylPREDNISolone acetate (DEPO-MEDROL) injection 80 mg     Laterality: Left  Location/Site:  L5-S1  Needle: 3.5 in., 20 ga. Tuohy  Needle Placement: Paramedian epidural  Findings:   -Comments: Excellent flow of contrast into the epidural space.  Procedure Details: Using a paramedian approach from the side mentioned above, the region overlying the inferior lamina was localized under fluoroscopic visualization and the soft tissues overlying this structure were infiltrated with 4 ml. of 1% Lidocaine without Epinephrine. The Tuohy needle was inserted into the epidural space using a paramedian approach.   The epidural space was localized using loss of resistance along with counter oblique bi-planar fluoroscopic views.  After negative aspirate for air, blood, and CSF, a 2 ml. volume of Isovue-250 was injected into the epidural space and the flow of contrast was observed. Radiographs were obtained for documentation purposes.    The injectate was administered into the level noted above.   Additional Comments:  No complications occurred Dressing: 2 x 2 sterile gauze and Band-Aid    Post-procedure details: Patient was observed during the procedure. Post-procedure instructions were reviewed.  Patient left the clinic in stable condition.

## 2021-07-12 NOTE — Therapy (Incomplete)
OUTPATIENT PHYSICAL THERAPY TREATMENT NOTE   Patient Name: Barbara Patton MRN: [id number] DOB:12/25/1940, 81 y.o., female Today's Date: 07/12/2021  PCP: Seward Carol, MD REFERRING PROVIDER: Tat, Eustace Quail, DO      Past Medical History:  Diagnosis Date   Glaucoma    High cholesterol    Hypertension    Lupus ([place])    Thyroid disease    Past Surgical History:  Procedure Laterality Date   ABDOMINAL HYSTERECTOMY     CATARACT EXTRACTION, BILATERAL     TOTAL KNEE ARTHROPLASTY Bilateral    TUBAL LIGATION     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 05/03/2014   Pes anserinus bursitis of left knee 12/04/2013   Trochanteric bursitis of left hip 08/07/2013   Leg swelling 12/06/2011   Glaucoma 11/02/2011   HLD (hyperlipidemia) 11/02/2011   HTN (hypertension) 11/02/2011   Hypothyroid 11/02/2011   OA (osteoarthritis) 11/02/2011   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 ([place]) 03/16/2011   Presence of artificial knee joint 03/16/2011    REFERRING PROVIDER: Ludwig Clarks, DO   REFERRING DIAG: Neck pain  THERAPY DIAG:  No diagnosis found.  ONSET DATE: ongoing for greater than 1 year  PERTINENT HISTORY: None  PRECAUTIONS: None WEIGHT BEARING RESTRICTIONS: No   SUBJECTIVE: Patient reports her neck is doing much better. Not really having much pain today.  PAIN:  Are you having pain? No VAS scale: 0 / 10 Pain location: Neck Pain orientation: Right and Left  PAIN  TYPE: Chronic Pain description: intermittent, aching, and sore   Aggravating factors: Turning head Relieving factors: CBD cream  PATIENT GOALS: Pain relief   OBJECTIVE: (BOLDED MEASURES ASSESSED THIS VISIT) PATIENT SURVEYS:  FOTO 58% functional status   CERVICAL AROM   AROM A/PROM (deg) 06/15/2021   06/29/2021 (post tx)  Flexion 50   Extension 30   Right lateral flexion 20   Left lateral flexion 20   Right rotation 50 60  Left rotation 50 65   UE MMT:   MMT Right 06/15/2021 Left 06/15/2021 Right / Left 07/06/2021  Shoulder flexion 4 4   Shoulder extension 4 4   Shoulder abduction 4 4   Shoulder ER 4 4 4  / 4  Elbow flexion 5 5   Elbow extension 5 5   Wrist flexion 5 5   Wrist extension 5 5   Grip strength 35 20      TODAY'S TREATMENT:  07/06/2021: UBE L1 x 4 min (2 fwd/bwd) while taking subjective    07/06/2021: UBE L1 x 4 min (2 fwd/bwd) while taking subjective Corner chest stretch 3 x 20 sec Sidelying thoracic rotation x 10 each Row with green 3 x 15 Shoulder extension + scap retraction with green 2 x 15 Supine shoulder horizontal abduction with red 2 x 15 Supine diagonals with red 2 x 10 each Double ER + scap retraction with red 2 x 10  06/29/2021 Therapeutic Exercise: UBE L1 x 4 min (2 fwd/bwd) Upper trap stretch 2 x 20 sec  each Levator stretch 2 x 20 each Seated chin tuck 10 x 5 sec - cueing require for proper technique Row with green 2 x 10 - cued to avoid shrug Shoulder extension + scap retraction with red 2 x 10 Manual Therapy: Skilled palpation and monitoring of muscle tension throughout dry needling treatment STM for bilateral upper trap region Suboccipital release with gentle manual traction Trigger Point Dry Needling Treatment: Pre-treatment instruction: Patient instructed on dry needling rationale, procedures, and possible side effects including pain during treatment (achy,cramping feeling), bruising, drop of blood, lightheadedness, nausea,  sweating. Patient Consent Given: Yes Education handout provided: No Muscles treated: Bilateral upper trap and right infraspinatus  Needle size and number: .30x58mm x 3 Electrical stimulation performed: No Parameters: N/A Treatment response/outcome: Twitch response elicited, Palpable decrease in muscle tension, and patient report of reduced tightness with neck motion Post-treatment instructions: Patient instructed to expect possible mild to moderate muscle soreness later today and/or tomorrow. Patient instructed in methods to reduce muscle soreness and to continue prescribed HEP. If patient was dry needled over the lung field, patient was instructed on signs and symptoms of pneumothorax and, however unlikely, to see immediate medical attention should they occur. Patient was also educated on signs and symptoms of infection and to seek medical attention should they occur. Patient verbalized understanding of these instructions and education.    PATIENT EDUCATION:  Education details: HEP update, FOTO Person educated: Patient Education method: Explanation, Demonstration, Tactile cues, Verbal cues, Handout Education comprehension: verbalized understanding, returned demonstration, verbal cues required, tactile cues required, and needs further education   HOME EXERCISE PROGRAM: Access Code: [email or web address]     ASSESSMENT: CLINICAL IMPRESSION: Patient tolerated therapy well with no adverse effects. Patient reporting feeling much improved following dry needling so this visit focused primarily on improving mobility and strength in order to progress her postural control. She continues to demonstrate postural deficits and gross weakness of posterior cuff/periscapular musculature. Updated HEP to progress postural strengthening at home. Patient would benefit from continued skilled PT to reduce pain and progress postural control in order to maximize functional ability.     GOALS: Goals reviewed with patient? Yes    SHORT TERM GOALS:   STG Name Target Date Goal status  1 Patient will be I with initial HEP in order to progress with therapy. Baseline: progressing 07/06/2021 ONGOING  2 PT will review FOTO with patient by 3rd visit in order to understand expected progress and outcome with therapy. Baseline: reviewed 3rd visit 07/06/2021 ACHIEVED    LONG TERM GOALS:    LTG Name Target Date Goal status  1 Patient will be I with final HEP to maintain progress from PT. Baseline: provided at eval 07/27/2021 INITIAL  2 Patient will report >/= 64% status on FOTO to indicate improved functional ability.  Baseline: 58% 07/27/2021 INITIAL  3 Patient will improve cervical rotation > 10 deg bilaterally to improve driving ability Baseline: 50 deg cervical rotation bilat 07/27/2021 INITIAL  4 Patient will report neck pain level </= 4/10 to reduce functional limitations Baseline: 8/10 07/27/2021 INITIAL      PLAN: PT FREQUENCY: 1x/week   PT DURATION: 6 weeks   PLANNED INTERVENTIONS: Therapeutic exercises, Therapeutic activity, Neuro Muscular re-education, Balance training, Gait training, Patient/Family education, Joint mobilization, Dry Needling, Electrical stimulation, Spinal mobilization, Cryotherapy, Moist heat, Traction, and Manual therapy   PLAN FOR NEXT SESSION: Review HEP and progress PRN, manual/dry needling for cervical, trial SNAGs, progress postural control    Hilda Blades, PT, DPT, LAT,  ATC 07/12/21  10:07 AM Phone: [phone] Fax: [phone]

## 2021-07-12 NOTE — Progress Notes (Signed)
[name] - 81 y.o. female MRN [id number]  Date of birth: 1940/09/11  Office Visit Note: Visit Date: 06/28/2021 PCP: Seward Carol, MD Referred by: Donella Stade, PA-C  Subjective: Chief Complaint  Patient presents with   Lower Back - Pain   Left Hip - Pain   HPI:  Barbara Patton is a 81 y.o. female who comes in today at the request of Annie Main, PA-C for planned Left L5-S1 Lumbar Interlaminar epidural steroid injection with fluoroscopic guidance.  The patient has failed conservative care including home exercise, medications, time and activity modification.  This injection will be diagnostic and hopefully therapeutic.  Please see requesting physician notes for further details and justification.   ROS Otherwise per HPI.  Assessment & Plan: Visit Diagnoses:    ICD-10-CM   1. Lumbar radiculopathy  M54.16 XR C-ARM NO REPORT    Epidural Steroid injection    methylPREDNISolone acetate (DEPO-MEDROL) injection 80 mg      Plan: No additional findings.   Meds & Orders:  Meds ordered this encounter  Medications   methylPREDNISolone acetate (DEPO-MEDROL) injection 80 mg    Orders Placed This Encounter  Procedures   XR C-ARM NO REPORT   Epidural Steroid injection    Follow-up: Return if symptoms worsen or fail to improve.   Procedures: No procedures performed  Lumbar Epidural Steroid Injection - Interlaminar Approach with Fluoroscopic Guidance  Patient: Barbara Patton      Date of Birth: 1940-06-18 MRN: [id number] PCP: Seward Carol, MD      Visit Date: 06/28/2021   Universal Protocol:     Consent Given By: the patient  Position: PRONE  Additional Comments: Vital signs were monitored before and after the procedure. Patient was prepped and draped in the usual sterile fashion. The correct patient, procedure, and site was verified.   Injection Procedure Details:   Procedure diagnoses: Lumbar radiculopathy [M54.16]   Meds Administered:  Meds ordered  this encounter  Medications   methylPREDNISolone acetate (DEPO-MEDROL) injection 80 mg     Laterality: Left  Location/Site:  L5-S1  Needle: 3.5 in., 20 ga. Tuohy  Needle Placement: Paramedian epidural  Findings:   -Comments: Excellent flow of contrast into the epidural space.  Procedure Details: Using a paramedian approach from the side mentioned above, the region overlying the inferior lamina was localized under fluoroscopic visualization and the soft tissues overlying this structure were infiltrated with 4 ml. of 1% Lidocaine without Epinephrine. The Tuohy needle was inserted into the epidural space using a paramedian approach.   The epidural space was localized using loss of resistance along with counter oblique bi-planar fluoroscopic views.  After negative aspirate for air, blood, and CSF, a 2 ml. volume of Isovue-250 was injected into the epidural space and the flow of contrast was observed. Radiographs were obtained for documentation purposes.    The injectate was administered into the level noted above.   Additional Comments:  No complications occurred Dressing: 2 x 2 sterile gauze and Band-Aid    Post-procedure details: Patient was observed during the procedure. Post-procedure instructions were reviewed.  Patient left the clinic in stable condition.    Clinical History: CLINICAL DATA:  Chronic low back pain with left leg pain for several years   EXAM: MRI LUMBAR SPINE WITHOUT CONTRAST   TECHNIQUE: Multiplanar, multisequence MR imaging of the lumbar spine was performed. No intravenous contrast was administered.   COMPARISON:  10/04/2009   FINDINGS: Segmentation:  Standard lumbar numbering   Alignment:  Mild anterolisthesis at L3-4 and L4-5.   Vertebrae:  No fracture, evidence of discitis, or bone lesion.   Conus medullaris and cauda equina: Conus extends to the L1-2 level. Conus and cauda equina appear normal.   Paraspinal and other soft tissues:  Distended bladder which is trabeculated. Prominent atrophy of intrinsic back muscles. Partially covered sigmoid diverticulosis.   Disc levels:   T12- L1: Minor spondylosis and annulus bulging   L1-L2: Unremarkable for age   L2-L3: Mild facet spurring and annulus bulging.   L3-L4: Prominent facet osteoarthritis with spurring and effusions in the gaping joint spaces. Mild anterolisthesis. Mild disc narrowing and bulging. There is moderate spinal stenosis but no static nerve root compression.   L4-L5: Facet osteoarthritis with spurring and mild anterolisthesis. Mild disc bulging. No neural compression   L5-S1:Mild facet spurring and disc bulging.   IMPRESSION: 1. 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 2. 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 3. 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     Electronically Signed   By: Monte Fantasia M.D.   On: 12/05/2019 05:54.     Objective:  VS:  HT:     WT:    BMI:      BP:(!) 169/81   HR:76bpm   TEMP: ( )   RESP:  Physical Exam Vitals and nursing note reviewed.  Constitutional:      General: She is not in acute distress.    Appearance: Normal appearance. She is not ill-appearing.  HENT:     Head: Normocephalic and atraumatic.     Right Ear: External ear normal.     Left Ear: External ear normal.  Eyes:     Extraocular Movements: Extraocular movements intact.  Cardiovascular:     Rate and Rhythm: Normal rate.     Pulses: Normal pulses.  Pulmonary:     Effort: Pulmonary effort is normal. No respiratory distress.  Abdominal:     General: There is no distension.     Palpations: Abdomen is soft.  Musculoskeletal:        General: Tenderness present.     Cervical back: Neck supple.     Right lower leg: No edema.     Left lower leg: No edema.     Comments: Patient has good distal strength  with no pain over the greater trochanters.  No clonus or focal weakness.  Skin:    Findings: No erythema, lesion or rash.  Neurological:     General: No focal deficit present.     Mental Status: She is alert and oriented to person, place, and time.     Sensory: No sensory deficit.     Motor: No weakness or abnormal muscle tone.     Coordination: Coordination normal.  Psychiatric:        Mood and Affect: Mood normal.        Behavior: Behavior normal.     Imaging: No results found.

## 2021-07-13 ENCOUNTER — Ambulatory Visit: Payer: Medicare PPO | Admitting: Physical Therapy

## 2021-07-18 NOTE — Therapy (Signed)
OUTPATIENT PHYSICAL THERAPY TREATMENT NOTE   Patient Name: Barbara Patton MRN: [id number] DOB:10/28/1940, 81 y.o., female Today's Date: 07/20/2021  PCP: Seward Carol, MD REFERRING PROVIDER: Ludwig Clarks, DO   PT End of Session - 07/20/21 1224     Visit Number 4    Number of Visits 6    Date for PT Re-Evaluation 07/27/21    Authorization Type [employer] / [employer]    Authorization Time Period 06/15/2021 - 08/03/2021    Authorization - Visit Number 4    Authorization - Number of Visits 7    Progress Note Due on Visit 10    PT Start Time 1215    PT Stop Time 1300    PT Time Calculation (min) 45 min    Activity Tolerance Patient tolerated treatment well    Behavior During Therapy WFL for tasks assessed/performed               Past Medical History:  Diagnosis Date   Glaucoma    High cholesterol    Hypertension    Lupus ([place])    Thyroid disease    Past Surgical History:  Procedure Laterality Date   ABDOMINAL HYSTERECTOMY     CATARACT EXTRACTION, BILATERAL     TOTAL KNEE ARTHROPLASTY Bilateral    TUBAL LIGATION     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 05/03/2014   Pes anserinus bursitis of left knee 12/04/2013   Trochanteric bursitis of left hip 08/07/2013   Leg swelling 12/06/2011   Glaucoma 11/02/2011   HLD (hyperlipidemia) 11/02/2011   HTN (hypertension) 11/02/2011   Hypothyroid 11/02/2011   OA (osteoarthritis) 11/02/2011   Pain due to total left knee  replacement ([place]) 03/16/2011   Presence of artificial knee joint 03/16/2011    REFERRING PROVIDER: Ludwig Clarks, DO   REFERRING DIAG: Neck pain  THERAPY DIAG:  Cervicalgia  Muscle weakness (generalized)  Abnormal posture  ONSET DATE: ongoing for greater than 1 year  PERTINENT HISTORY: None  PRECAUTIONS: None WEIGHT BEARING RESTRICTIONS: No   SUBJECTIVE: Patient reports she is still doing well, reports no pain in the neck today. She states she does continue to have the bilateral hand shaking when she is going to reach or lift up something.  PAIN:  Are you having pain? No VAS scale: 0 / 10 Pain location: Neck Pain orientation: Right and Left  PAIN TYPE: Chronic Pain description: intermittent, aching, and sore   Aggravating factors: Turning head Relieving factors: CBD cream  PATIENT GOALS: Pain relief   OBJECTIVE: (BOLDED MEASURES ASSESSED THIS VISIT) PATIENT SURVEYS:  FOTO 59% functional status   CERVICAL AROM   AROM A/PROM (deg) 06/15/2021   06/29/2021 (post tx)   07/20/2021  Flexion 50    Extension 30    Right lateral flexion 20    Left lateral flexion 20    Right rotation 50 60 65  Left rotation 50 65 70   UE MMT:   MMT Right 06/15/2021 Left 06/15/2021 Right / Left 07/06/2021  Shoulder  flexion 4 4   Shoulder extension 4 4   Shoulder abduction 4 4   Shoulder ER 4 4 4  / 4  Elbow flexion 5 5   Elbow extension 5 5   Wrist flexion 5 5   Wrist extension 5 5   Grip strength 35 20      TODAY'S TREATMENT:  07/20/2021: Therapeutic Exercise: UBE L1 x 4 min (2 fwd/bwd) while taking subjective Upper trap and levator scap stretch 2 x 30 sec each  Corner chest stretch 3 x 20 sec Sidelying thoracic rotation 5 x 10 sec each Supine chin tuck 10 x 5 sec Seated chin tuck 10 x 5 sec Row with green 2 x 15 Manual: Skilled palpation and monitoring of muscle tension throughout dry needling treatment STM for right upper trap and infraspinatus Suboccipital  release with gentle manual traction Trigger Point Dry Needling Treatment: Pre-treatment instruction: Patient instructed on dry needling rationale, procedures, and possible side effects including pain during treatment (achy,cramping feeling), bruising, drop of blood, lightheadedness, nausea, sweating. Patient Consent Given: Yes Education handout provided: No Muscles treated: Right upper trap, infraspinatus, levator scap, rhomboid Needle size and number: .30x46mm x 5 Electrical stimulation performed: No Parameters: N/A Treatment response/outcome: Twitch response elicited, Palpable decrease in muscle tension, and patient report of reduced tightness with neck motion Post-treatment instructions: Patient instructed to expect possible mild to moderate muscle soreness later today and/or tomorrow. Patient instructed in methods to reduce muscle soreness and to continue prescribed HEP. If patient was dry needled over the lung field, patient was instructed on signs and symptoms of pneumothorax and, however unlikely, to see immediate medical attention should they occur. Patient was also educated on signs and symptoms of infection and to seek medical attention should they occur. Patient verbalized understanding of these instructions and education.   07/06/2021: UBE L1 x 4 min (2 fwd/bwd) while taking subjective Corner chest stretch 3 x 20 sec Sidelying thoracic rotation x 10 each Row with green 3 x 15 Shoulder extension + scap retraction with green 2 x 15 Supine shoulder horizontal abduction with red 2 x 15 Supine diagonals with red 2 x 10 each Double ER + scap retraction with red 2 x 10  06/29/2021 Therapeutic Exercise: UBE L1 x 4 min (2 fwd/bwd) Upper trap stretch 2 x 20 sec each Levator stretch 2 x 20 each Seated chin tuck 10 x 5 sec - cueing require for proper technique Row with green 2 x 10 - cued to avoid shrug Shoulder extension + scap retraction with red 2 x 10 Manual Therapy: Skilled  palpation and monitoring of muscle tension throughout dry needling treatment STM for bilateral upper trap region Suboccipital release with gentle manual traction Trigger Point Dry Needling Treatment: Pre-treatment instruction: Patient instructed on dry needling rationale, procedures, and possible side effects including pain during treatment (achy,cramping feeling), bruising, drop of blood, lightheadedness, nausea, sweating. Patient Consent Given: Yes Education handout provided: No Muscles treated: Bilateral upper trap and right infraspinatus  Needle size and number: .30x65mm x 3 Electrical stimulation performed: No Parameters: N/A Treatment response/outcome: Twitch response elicited, Palpable decrease in muscle tension, and patient report of reduced tightness with neck motion Post-treatment instructions: Patient instructed to expect possible mild to moderate muscle soreness later today and/or tomorrow. Patient instructed in methods to reduce muscle soreness and to continue prescribed HEP. If patient was dry needled over the lung field, patient was instructed on signs and symptoms of pneumothorax and, however unlikely, to see immediate medical attention  should they occur. Patient was also educated on signs and symptoms of infection and to seek medical attention should they occur. Patient verbalized understanding of these instructions and education.    PATIENT EDUCATION:  Education details: HEP Person educated: Patient Education method: Education officer, environmental, Corporate treasurer cues, Verbal cues Education comprehension: verbalized understanding, returned demonstration, verbal cues required, tactile cues required, and needs further education   HOME EXERCISE PROGRAM: Access Code: [email or web address]     ASSESSMENT: CLINICAL IMPRESSION: Patient tolerated therapy well with no adverse effects. Patient continues to report improvement in neck symptoms with slight improvement in her functional status reported on  FOTO. She demonstrates improvement in cervical motion this visit. Continued with TPDN treatment for mainly right neck and shoulder this visit with patient reporting improvement in her symptoms following treatment. Continued working on postural control exercises with good tolerance. No changes made to HEP this visit and plan for discharge at next visit if patient continuing to do well. Patient does continue to report bilateral hand shaking/tremor when she is reach for objects or going to pick something up, and this does not seem to be related to cervical spine. Patient would benefit from continued skilled PT to reduce pain and progress postural control in order to maximize functional ability.  Objective impairments include decreased ROM, decreased strength, impaired flexibility, postural dysfunction, and pain.     GOALS: Goals reviewed with patient? Yes   SHORT TERM GOALS:   STG Name Target Date Goal status  1 Patient will be I with initial HEP in order to progress with therapy. Baseline: independent 07/06/2021 ACHIEVED  2 PT will review FOTO with patient by 3rd visit in order to understand expected progress and outcome with therapy. Baseline: reviewed 3rd visit 07/06/2021 ACHIEVED    LONG TERM GOALS:    LTG Name Target Date Goal status  1 Patient will be I with final HEP to maintain progress from PT. Baseline: progressing toward independence 07/27/2021 ONGOING  2 Patient will report >/= 64% status on FOTO to indicate improved functional ability.  Baseline: 59% 07/27/2021 ONGOING  3 Patient will improve cervical rotation > 10 deg bilaterally to improve driving ability Baseline: 65-70 deg rotation 07/27/2021 ACHIEVED  4 Patient will report neck pain level </= 4/10 to reduce functional limitations Baseline: 0/10 07/27/2021 ACHIEVED      PLAN: PT FREQUENCY: 1x/week   PT DURATION: 6 weeks   PLANNED INTERVENTIONS: Therapeutic exercises, Therapeutic activity, Neuro Muscular re-education, Balance  training, Gait training, Patient/Family education, Joint mobilization, Dry Needling, Electrical stimulation, Spinal mobilization, Cryotherapy, Moist heat, Traction, and Manual therapy   PLAN FOR NEXT SESSION: Review HEP and progress PRN, manual/dry needling for cervical, trial SNAGs, progress postural control    Hilda Blades, PT, DPT, LAT, ATC 07/20/21  1:09 PM Phone: [phone] Fax: [phone]

## 2021-07-20 ENCOUNTER — Other Ambulatory Visit: Payer: Self-pay

## 2021-07-20 ENCOUNTER — Encounter: Payer: Self-pay | Admitting: Physical Therapy

## 2021-07-20 ENCOUNTER — Ambulatory Visit: Payer: Medicare PPO | Admitting: Physical Therapy

## 2021-07-20 DIAGNOSIS — M542 Cervicalgia: Secondary | ICD-10-CM | POA: Diagnosis not present

## 2021-07-20 DIAGNOSIS — M6281 Muscle weakness (generalized): Secondary | ICD-10-CM | POA: Diagnosis not present

## 2021-07-20 DIAGNOSIS — R293 Abnormal posture: Secondary | ICD-10-CM | POA: Diagnosis not present

## 2021-08-01 ENCOUNTER — Telehealth: Payer: Self-pay | Admitting: Physical Medicine and Rehabilitation

## 2021-08-01 NOTE — Telephone Encounter (Signed)
Pt would like to get a left hip injection   CB [phone]

## 2021-08-01 NOTE — Therapy (Signed)
OUTPATIENT PHYSICAL THERAPY TREATMENT NOTE  DISCHARGE   Patient Name: Barbara Patton MRN: [id number] DOB:20-May-1941, 81 y.o., female Today's Date: 08/03/2021  PCP: Seward Carol, MD REFERRING PROVIDER: Seward Carol, MD   PT End of Session - 08/03/21 1116     Visit Number 5    Number of Visits 6    Date for PT Re-Evaluation 08/03/21    Authorization Type [employer] / [employer]    Authorization Time Period 06/15/2021 - 08/03/2021    Authorization - Visit Number 5    Authorization - Number of Visits 7    Progress Note Due on Visit 10    PT Start Time 1115    PT Stop Time 1200    PT Time Calculation (min) 45 min    Activity Tolerance Patient tolerated treatment well    Behavior During Therapy [facility] for tasks assessed/performed                Past Medical History:  Diagnosis Date   Glaucoma    High cholesterol    Hypertension    Lupus ([place])    Thyroid disease    Past Surgical History:  Procedure Laterality Date   ABDOMINAL HYSTERECTOMY     CATARACT EXTRACTION, BILATERAL     TOTAL KNEE ARTHROPLASTY Bilateral    TUBAL LIGATION     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 05/03/2014   Pes anserinus bursitis of left knee 12/04/2013   Trochanteric bursitis of left hip 08/07/2013   Leg swelling 12/06/2011   Glaucoma 11/02/2011   HLD (hyperlipidemia) 11/02/2011   HTN (hypertension) 11/02/2011   Hypothyroid 11/02/2011   OA (osteoarthritis) 11/02/2011   Pain due to total  left knee replacement ([place]) 03/16/2011   Presence of artificial knee joint 03/16/2011    REFERRING PROVIDER: Ludwig Clarks, DO   REFERRING DIAG: Neck pain  THERAPY DIAG:  Cervicalgia  Muscle weakness (generalized)  Abnormal posture  ONSET DATE: ongoing for greater than 1 year  PERTINENT HISTORY: None  PRECAUTIONS: None WEIGHT BEARING RESTRICTIONS: No   SUBJECTIVE: Patient reports she is still doing well, she does have some soreness in her neck but overall much better. She states that when she goes bowling she doesn't have pain in the right shoulder.  PAIN:  Are you having pain? No VAS scale: 1 / 10 Pain location: Neck Pain orientation: Right and Left  PAIN TYPE: Chronic Pain description: intermittent, sore   Aggravating factors: Turning head Relieving factors: CBD cream  PATIENT GOALS: Pain relief   OBJECTIVE: (BOLDED MEASURES ASSESSED THIS VISIT) PATIENT SURVEYS:  FOTO 66% functional status - 08/03/2021   CERVICAL AROM   AROM A/PROM (deg) 06/15/2021   06/29/2021 (post tx)   07/20/2021   08/03/2021  Flexion 50     Extension 30     Right lateral flexion 20     Left lateral flexion 20     Right rotation 50 60 65 65  Left rotation 50 65 70 70   UE MMT:   MMT  Right 06/15/2021 Left 06/15/2021 Right / Left 07/06/2021  Shoulder flexion 4 4   Shoulder extension 4 4   Shoulder abduction 4 4   Shoulder ER '4 4 4 ' / 4  Elbow flexion 5 5   Elbow extension 5 5   Wrist flexion 5 5   Wrist extension 5 5   Grip strength 35 20      TODAY'S TREATMENT:  08/03/2021: Therapeutic Exercise: UBE L1 x 4 min (2 fwd/bwd) while taking subjective Upper trap and levator scap stretch 2 x 30 sec each  Corner chest stretch 3 x 20 sec Sidelying thoracic rotation 5 x 10 sec each Supine chin tuck 10 x 5 sec Supine horizontal abduction with yellow x 15 Row with green 2 x 15 Extension with yellow 2 x 10 Manual: Skilled palpation and monitoring of muscle tension throughout dry  needling treatment STM for right upper trap and infraspinatus Suboccipital release with gentle manual traction Trigger Point Dry Needling Treatment: Pre-treatment instruction: Patient instructed on dry needling rationale, procedures, and possible side effects including pain during treatment (achy,cramping feeling), bruising, drop of blood, lightheadedness, nausea, sweating. Patient Consent Given: Yes Education handout provided: No Muscles treated: Right upper trap, suboccipital, infraspinatus; left upper trap Needle size and number: .30x37m x 5 Electrical stimulation performed: No Parameters: N/A Treatment response/outcome: Twitch response elicited, Palpable decrease in muscle tension, and patient report of reduced tightness with neck motion Post-treatment instructions: Patient instructed to expect possible mild to moderate muscle soreness later today and/or tomorrow. Patient instructed in methods to reduce muscle soreness and to continue prescribed HEP. If patient was dry needled over the lung field, patient was instructed on signs and symptoms of pneumothorax and, however unlikely, to see immediate medical attention should they occur. Patient was also educated on signs and symptoms of infection and to seek medical attention should they occur. Patient verbalized understanding of these instructions and education.   07/20/2021: Therapeutic Exercise: UBE L1 x 4 min (2 fwd/bwd) while taking subjective Upper trap and levator scap stretch 2 x 30 sec each  Corner chest stretch 3 x 20 sec Sidelying thoracic rotation 5 x 10 sec each Supine chin tuck 10 x 5 sec Seated chin tuck 10 x 5 sec Row with green 2 x 15 Manual: Skilled palpation and monitoring of muscle tension throughout dry needling treatment STM for right upper trap and infraspinatus Suboccipital release with gentle manual traction Trigger Point Dry Needling Treatment: Pre-treatment instruction: Patient instructed on dry needling  rationale, procedures, and possible side effects including pain during treatment (achy,cramping feeling), bruising, drop of blood, lightheadedness, nausea, sweating. Patient Consent Given: Yes Education handout provided: No Muscles treated: Right upper trap, infraspinatus, levator scap, rhomboid Needle size and number: .30x511mx 5 Electrical stimulation performed: No Parameters: N/A Treatment response/outcome: Twitch response elicited, Palpable decrease in muscle tension, and patient report of reduced tightness with neck motion Post-treatment instructions: Patient instructed to expect possible mild to moderate muscle soreness later today and/or tomorrow. Patient instructed in methods to reduce muscle soreness and to continue prescribed HEP. If patient was dry needled over the lung field, patient was instructed on signs and symptoms of pneumothorax and, however unlikely, to see immediate medical attention should they occur. Patient was also educated on signs and symptoms of infection and to seek medical attention should they occur. Patient verbalized understanding of these instructions and education.    PATIENT EDUCATION:  Education details: POC discharge, HEP finalization Person educated: Patient Education method: Explanation, Demonstration, Ta[employer] treasurer  cues, Verbal cues Education comprehension: verbalized understanding, returned demonstration, verbal cues required, tactile cues required, and needs further education   HOME EXERCISE PROGRAM: Access Code: [email or web address]     ASSESSMENT: CLINICAL IMPRESSION: Patient tolerated therapy well with no adverse effects. She has achieved all established goals, reporting improved functional ability, improved range of motion, improved strength and reduction of pain. She is independent with her HEP and will be formally discharged from PT.   Objective impairments include decreased ROM, decreased strength, impaired flexibility, postural dysfunction, and pain.      GOALS: Goals reviewed with patient? Yes   SHORT TERM GOALS:   STG Name Target Date Goal status  1 Patient will be I with initial HEP in order to progress with therapy. Baseline: independent 07/06/2021 ACHIEVED  2 PT will review FOTO with patient by 3rd visit in order to understand expected progress and outcome with therapy. Baseline: reviewed 3rd visit 07/06/2021 ACHIEVED    LONG TERM GOALS:    LTG Name Target Date Goal status  1 Patient will be I with final HEP to maintain progress from PT. Baseline: patient demonstrates independence 07/27/2021 ACHIEVED  2 Patient will report >/= 64% status on FOTO to indicate improved functional ability.  Baseline: 66% 07/27/2021 ACHIEVED  3 Patient will improve cervical rotation > 10 deg bilaterally to improve driving ability Baseline: 65-70 deg rotation 07/27/2021 ACHIEVED  4 Patient will report neck pain level </= 4/10 to reduce functional limitations Baseline: 1/10 07/27/2021 ACHIEVED      PLAN: PT FREQUENCY: 1x/week   PT DURATION: 6 weeks   PLANNED INTERVENTIONS: Therapeutic exercises, Therapeutic activity, Neuro Muscular re-education, Balance training, Gait training, Patient/Family education, Joint mobilization, Dry Needling, Electrical stimulation, Spinal mobilization, Cryotherapy, Moist heat, Traction, and Manual therapy   PLAN FOR NEXT SESSION: NA - discharge    Hilda Blades, PT, DPT, LAT, ATC 08/03/21  12:05 PM Phone: [phone] Fax: [phone]   PHYSICAL THERAPY DISCHARGE SUMMARY  Visits from Start of Care: 5  Current functional level related to goals / functional outcomes: See above   Remaining deficits: See above   Education / Equipment: HEP   Patient agrees to discharge. Patient goals were met. Patient is being discharged due to meeting the stated rehab goals.

## 2021-08-02 DIAGNOSIS — R040 Epistaxis: Secondary | ICD-10-CM | POA: Diagnosis not present

## 2021-08-03 ENCOUNTER — Other Ambulatory Visit: Payer: Self-pay

## 2021-08-03 ENCOUNTER — Encounter: Payer: Self-pay | Admitting: Physical Therapy

## 2021-08-03 ENCOUNTER — Ambulatory Visit: Payer: Medicare HMO | Attending: Neurology | Admitting: Physical Therapy

## 2021-08-03 DIAGNOSIS — R293 Abnormal posture: Secondary | ICD-10-CM | POA: Insufficient documentation

## 2021-08-03 DIAGNOSIS — M542 Cervicalgia: Secondary | ICD-10-CM | POA: Insufficient documentation

## 2021-08-03 DIAGNOSIS — M6281 Muscle weakness (generalized): Secondary | ICD-10-CM | POA: Diagnosis not present

## 2021-08-12 ENCOUNTER — Other Ambulatory Visit: Payer: Self-pay

## 2021-08-12 ENCOUNTER — Encounter: Payer: Self-pay | Admitting: Surgical

## 2021-08-12 ENCOUNTER — Ambulatory Visit (INDEPENDENT_AMBULATORY_CARE_PROVIDER_SITE_OTHER): Payer: Medicare HMO | Admitting: Surgical

## 2021-08-12 ENCOUNTER — Ambulatory Visit: Payer: Self-pay

## 2021-08-12 ENCOUNTER — Ambulatory Visit (INDEPENDENT_AMBULATORY_CARE_PROVIDER_SITE_OTHER): Payer: Medicare HMO

## 2021-08-12 DIAGNOSIS — M1612 Unilateral primary osteoarthritis, left hip: Secondary | ICD-10-CM

## 2021-08-12 DIAGNOSIS — M25552 Pain in left hip: Secondary | ICD-10-CM | POA: Diagnosis not present

## 2021-08-12 MED ORDER — BUPIVACAINE HCL 0.25 % IJ SOLN
4.0000 mL | INTRAMUSCULAR | Status: AC | PRN
  Administered 2021-08-12: 4 mL via INTRA_ARTICULAR

## 2021-08-12 MED ORDER — LIDOCAINE HCL 1 % IJ SOLN
5.0000 mL | INTRAMUSCULAR | Status: AC | PRN
  Administered 2021-08-12: 5 mL

## 2021-08-12 MED ORDER — METHYLPREDNISOLONE ACETATE 40 MG/ML IJ SUSP
40.0000 mg | INTRAMUSCULAR | Status: AC | PRN
  Administered 2021-08-12: 40 mg via INTRA_ARTICULAR

## 2021-08-12 NOTE — Progress Notes (Signed)
? ?Office Visit Note ?  ?Patient: Barbara Patton           ?Date of Birth: 1940/11/12           ?MRN: [id number] ?Visit Date: 08/12/2021 ?Requested by: Seward Carol, MD ?[address] ?[address] ?[place],  [place] [address] ?PCP: Seward Carol, MD ? ?Subjective: ?Chief Complaint  ?Patient presents with  ? Left Hip - Follow-up  ? ? ?HPI: Barbara Patton is a 81 y.o. female who presents to the office complaining of left hip and buttock pain.  Patient complains primarily of left hip pain that she localizes to the buttock.  She also complains of lateral hip pain though states the buttock pain is the primary thing that bothers her.  She has some associated back pain for which she receives ESI's from Dr. Ernestina Patches.  Last ESI was in late January where she received a left L5-S1 paramedian epidural injection.  She is using tramadol and over-the-counter Tylenol for pain control.  No recent falls or injuries.  She has no history of surgery in the left hip...   ?             ?ROS: All systems reviewed are negative as they relate to the chief complaint within the history of present illness.  Patient denies fevers or chills. ? ?Assessment & Plan: ?Visit Diagnoses:  ?1. Pain in left hip   ? ? ?Plan: Patient is a 81 year old female who presents for evaluation of left hip pain.  She is an active individual and goes to the [facility] most days of the week to participate in aqua aerobics classes.  She has had return of hip pain following a left hip injection on 06/10/2021 as well as a left trochanteric injection on the same date.  Previous injection did provide good relief for about a month.  She denies any groin pain today and states that this has not bothered her since her left hip injection.  However, she has continued lateral hip and buttock pain.  Suspect that the buttock pain may be related to her spine pathology after examination where the buttock pain was not really reproduced with hip range of motion.  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  Recommended physical therapy for this problem but she would like to hold off on this.  She was very insistent that she would like to try hip joint injection.  I advised her that this has a low chance of helping her with her lack of groin pain and lack of significant findings on exam related to the hip joint.  However, there is a small percentage of people with hip arthritis (like yourself) that have pain in the buttocks atypically instead of in the groin.  Plan to try ultrasound-guided hip joint injection in the left hip.  Patient tolerated procedure well.  She will see how much relief she gets from this and decide for or against physical therapy in the future based on her response.  May want to consider another ESI in the future to see if this will help with her buttock pain by Dr. Ernestina Patches. ? ?Follow-Up Instructions: No follow-ups on file.  ? ?Orders:  ?Orders Placed This Encounter  ?Procedures  ? XR HIP UNILAT W OR W/O PELVIS 2-3 VIEWS LEFT  ? US Guided Needle Placement - No Linked Charges  ?  Ambulatory referral to Physical Therapy  ? ?No orders of the defined types were placed in this encounter. ? ? ? ? Procedures: ?Large Joint Inj: L hip joint on 08/12/2021 7:01 PM ?Indications: pain and diagnostic evaluation ?Details: 18 G 3.5 in needle, ultrasound-guided anterior approach ? ?Arthrogram: No ? ?Medications: 5 mL lidocaine 1 %; 4 mL bupivacaine 0.25 %; 40 mg methylPREDNISolone acetate 40 MG/ML ?Outcome: tolerated well, no immediate complications ?Procedure, treatment alternatives, risks and benefits explained, specific risks discussed. Consent was given by the patient. Immediately prior to procedure a time out was called to verify the correct patient, procedure, equipment, support staff and site/side marked as required. Patient was prepped and  draped in the usual sterile fashion.  ? ? ? ? ?Clinical Data: ?No additional findings. ? ?Objective: ?Vital Signs: There were no vitals taken for this visit. ? ?Physical Exam:  ?Constitutional: Patient appears well-developed ?HEENT:  ?Head: Normocephalic ?Eyes:EOM are normal ?Neck: Normal range of motion ?Cardiovascular: Normal rate ?Pulmonary/chest: Effort normal ?Neurologic: Patient is alert ?Skin: Skin is warm ?Psychiatric: Patient has normal mood and affect ? ?Ortho Exam: Ortho exam demonstrates negative straight leg raise for radicular pain.  Just reproduces stretching pain in her hamstring.  She has negative Stinchfield sign.  No real pain with hip range of motion except at terminal internal rotation which causes a little bit of groin pain.  She has moderate tenderness over the greater trochanter of the left hip.  She has some pain that she localizes along the IT band with adduction of the left leg.  5/5 motor strength of bilateral hip flexion, quadricep, hamstring, dorsiflexion, plantarflexion.  Mild tenderness throughout the axial lumbar spine.  She has intact active internal rotation of the left hip with reproduction of pain in the lateral hip. ? ?Specialty Comments:  ?No specialty comments available. ? ?Imaging: ?No results found. ? ? ?PMFS History: ?Patient Active Problem List  ? Diagnosis Date Noted  ? 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  ? Referred otalgia of left ear 08/23/2015  ? 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  ? Allergic rhinitis 07/26/2015  ? Anxiety 07/26/2015  ? Benign essential hypertension 07/26/2015  ? Dysfunction of left eustachian tube 07/26/2015  ? Esophageal reflux 07/26/2015  ? Fibromyalgia 07/26/2015  ? Pure hypercholesterolemia 07/26/2015  ? 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 Pes anserinus bursitis of left knee 12/04/2013  ? Trochanteric bursitis of left hip 08/07/2013   ? Leg swelling 12/06/2011  ? Glaucoma 11/02/2011  ? HLD (hyperlipidemia) 11/02/2011  ? HTN (hypertension) 11/02/2011  ? Hypothyroid 11/02/2011  ? OA (osteoarthritis) 11/02/2011  ? 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 ([place]) 03/16/2011  ? Presence of artificial knee joint 03/16/2011  ? ?Past Medical History:  ?Diagnosis Date  ? Glaucoma   ? High cholesterol   ? Hypertension   ? Lupus ([place])   ? Thyroid disease   ?  ?Family History  ?Problem Relation Age of Onset  ? Cancer Father   ? Cancer Brother   ? Cancer Brother   ? Stroke Brother   ?  ?Past Surgical History:  ?Procedure Laterality Date  ? ABDOMINAL HYSTERECTOMY    ? CATARACT EXTRACTION, BILATERAL    ? TOTAL KNEE ARTHROPLASTY Bilateral   ? TUBAL LIGATION    ? ?Social History  ? ?Occupational History  ? Not on file  ?Tobacco Use  ? Smoking status: Former  ?  Types: Cigarettes  ?  Quit date: 1997  ?  Years since quitting: 26.2  ? Smokeless tobacco: Never  ?Vaping Use  ? Vaping Use: Never used  ?Substance and Sexual Activity  ? Alcohol use: Yes  ?  Alcohol/week: 3.0 standard drinks  ?  Types: 3 Glasses of wine per week  ?  Comment: occ wine  ? Drug use: No  ? Sexual activity: Not on file  ? ? ? ? ?  ?

## 2021-08-16 ENCOUNTER — Telehealth: Payer: Self-pay | Admitting: Surgical

## 2021-08-16 DIAGNOSIS — M5416 Radiculopathy, lumbar region: Secondary | ICD-10-CM

## 2021-08-16 NOTE — Telephone Encounter (Signed)
Pt was told to call in with updates after injection. Pt still having back pains and hip pains. Please call pt about this matter at [phone]. ?

## 2021-08-17 NOTE — Telephone Encounter (Signed)
Can u call thx

## 2021-08-18 NOTE — Progress Notes (Signed)
? ? ?Assessment/Plan:  ? ? ?1.  Hand pain and paresthesias ? -EMG negative ? -MRI cervical spine essentially unrevealing ? -Discussed with patient that I cannot find a neurologic reason for her hand to feel this way.  Discussed over the telephone that she really needs to follow back up with primary care to see if there is any other issue, whether it be rheumatologic or orthopedic.  There is no neurologic issue that we could find. ? ?2.  Tremor ? -As with the last several visits, very little tremor was noted on examination today.  No postural tremor.  No rest tremor.  No trouble with Archimedes spirals.   ? -Tremor continues to be a complaint.  Asked her today whether it was a consistent tremor or intermittent tremor and she stated consistent, but when I told her that I really did not see much tremor today, she stated that she does not have a much right now but had some earlier.  I did offer her primidone to see if it would help, but she did not really want anything, which I agree with given that I have not really seen tremor.  I provided strong reassurance that I saw no evidence of Parkinson's disease, which is what she really wanted today. ? ?Subjective:  ? ?Barbara Patton was seen today in follow up for tremor.  Patient requesting work in appointment.  Previously, I have seen almost no tremor on her examination, and therefore no medications were required.  She was complaining also last visit about dropping objects and decreased dexterity of the left hand.  We did an EMG and that was completely normal.  We subsequently did an MRI of her cervical spine and she had some cervical spondylosis, but it really had not progressed significantly since 2017.  There was only mild central canal stenosis.  She was told she needed to follow back up with her primary care physician, as there was no neurologic cause for her hand pain and dexterity issues of the hand.  She has been to physical therapy for her neck.  She has also  been to Dr. Ernestina Patches for her low back.  ? ?States today that her "shaking" is getting worse.  Mostly with picking up objects.  Bilateral UE, L>R but she is R hand dominant.  Thinks that its much worse since last seen.  States that it is always there.  It is worse when she turns pages of a book.  Not drinking much caffeine.  She does drink wine 3 times per week but that doesn't change tremor.   ? ? ? ?ALLERGIES:   ?Allergies  ?Allergen Reactions  ? Codeine Other (See Comments)  ?  Kept awake  ? Penicillins Hives  ?  Did it involve swelling of the face/tongue/throat, SOB, or low BP? N ?Did it involve sudden or severe rash/hives, skin peeling, or any reaction on the inside of your mouth or nose? Y ?Did you need to seek medical attention at a hospital or doctor's office? N ?When did it last happen?   Over 10 Years Ago    ?If all above answers are ?NO?, may proceed with cephalosporin use. ?  ? ? ?CURRENT MEDICATIONS:  ?Outpatient Encounter Medications as of 08/19/2021  ?Medication Sig  ? acetaminophen (TYLENOL) 325 MG tablet Take 650 mg by mouth 2 (two) times daily as needed (pain).  ? betamethasone dipropionate (DIPROLENE) 0.05 % ointment Apply 1 application topically 2 (two) times daily as needed (dry skin irritation).   ?  Cholecalciferol (VITAMIN D-3 PO) Take 1 tablet by mouth daily.  ? diclofenac Sodium (VOLTAREN) 1 % GEL Add'l Sig Add'l Sig topical Add'l Sig  ? DULoxetine (CYMBALTA) 30 MG capsule   ? hydrochlorothiazide (HYDRODIURIL) 25 MG tablet Take 25 mg by mouth daily.  ? hydroxychloroquine (PLAQUENIL) 200 MG tablet Take 200 mg by mouth daily.   ? hydrOXYzine (ATARAX/VISTARIL) 10 MG tablet Take 10 mg by mouth every 6 (six) hours as needed for itching or anxiety.   ? levothyroxine (SYNTHROID) 100 MCG tablet Take 100 mcg by mouth daily.  ? methocarbamol (ROBAXIN) 500 MG tablet Take 1 tablet (500 mg total) by mouth 2 (two) times daily as needed.  ? Multiple Vitamins-Minerals (MULTIVITAMIN PO) Take 1 tablet by mouth  daily.   ? omeprazole (PRILOSEC) 40 MG capsule Take 40 mg by mouth daily.   ? oxybutynin (DITROPAN) 5 MG tablet Take 5 mg by mouth 3 (three) times daily.   ? simvastatin (ZOCOR) 20 MG tablet Take 20 mg by mouth every evening.   ? traMADol (ULTRAM) 50 MG tablet Take 50 mg by mouth 2 (two) times daily as needed (pain).  ? Coenzyme Q10 (CO Q 10 PO) Take 1 tablet by mouth daily. (Patient not taking: Reported on 08/19/2021)  ? furosemide (LASIX) 40 MG tablet Take 40 mg by mouth daily.  (Patient not taking: Reported on 08/19/2021)  ? levothyroxine (SYNTHROID, LEVOTHROID) 112 MCG tablet Take 112 mcg by mouth daily.   ? timolol (TIMOPTIC) 0.5 % ophthalmic solution Place 1 drop into both eyes 2 (two) times daily.  ? tolterodine (DETROL LA) 4 MG 24 hr capsule TAKE 1 CAPSULE BY MOUTH ONCE DAILY FOR 30 DAYS (Patient not taking: Reported on 08/19/2021)  ? [DISCONTINUED] estradiol (ESTRACE) 0.5 MG tablet   ? ?No facility-administered encounter medications on file as of 08/19/2021.  ? ? ? ?Objective:  ? ? ?PHYSICAL EXAMINATION:   ? ?VITALS:   ?Vitals:  ? 08/19/21 1506  ?BP: (!) 146/79  ?Pulse: 78  ?SpO2: 96%  ?Weight: 191 lb 3.2 oz (86.7 kg)  ?Height: '5\' 6"'$  (1.676 m)  ? ? ? ?GEN:  The patient appears stated age and is in NAD. ?HEENT:  Normocephalic, atraumatic.  The mucous membranes are moist. The superficial temporal arteries are without ropiness or tenderness. ?CV:  RRR ?Lungs:  CTAB ?Neck/HEME:  There are no carotid bruits bilaterally. ? ?Neurological examination: ? ?Orientation: The patient is alert and oriented x3. ?Cranial nerves: There is good facial symmetry. The speech is fluent and clear. Soft palate rises symmetrically and there is no tongue deviation. Hearing is intact to conversational tone. ?Sensation: Sensation is intact to light touch throughout ?Motor: Strength is at least antigravity x4. ? ?Movement examination: ?Tone: There is normal tone in the bilateral upper extremities.  The tone in the lower extremities is  normal.  ?Abnormal movements: There is no rest tremor, even with distraction procedures.  No postural tremor.  She has no intention tremor.  She has no significant tremor when given a weight.  When she tries to separate pages from 1 another, she shows me some tremor of her thumb (same as previous). ?Coordination:  There is no decremation with RAM's, with any form of RAMS, including alternating supination and pronation of the forearm, hand opening and closing, finger taps, heel taps and toe taps ?I have reviewed and interpreted the following labs independently ?  Chemistry   ?   ?Component Value Date/Time  ? NA 140 01/27/2014 1014  ?  K 3.8 01/27/2014 1014  ? CL 102 01/27/2014 1014  ? CO2 29 01/27/2014 1014  ? BUN 15 01/27/2014 1014  ? CREATININE 0.73 01/27/2014 1014  ?    ?Component Value Date/Time  ? CALCIUM 9.2 01/27/2014 1014  ? ALKPHOS 95 01/27/2014 1014  ? AST 28 01/27/2014 1014  ? ALT 37 (H) 01/27/2014 1014  ? BILITOT 0.5 01/27/2014 1014  ?  ? ? ?Lab Results  ?Component Value Date  ? WBC 4.0 01/27/2014  ? HGB 12.2 01/27/2014  ? HCT 37.7 01/27/2014  ? MCV 97.4 01/27/2014  ? PLT 197 01/27/2014  ? ?No results found for: TSH ?  Chemistry   ?   ?Component Value Date/Time  ? NA 140 01/27/2014 1014  ? K 3.8 01/27/2014 1014  ? CL 102 01/27/2014 1014  ? CO2 29 01/27/2014 1014  ? BUN 15 01/27/2014 1014  ? CREATININE 0.73 01/27/2014 1014  ?    ?Component Value Date/Time  ? CALCIUM 9.2 01/27/2014 1014  ? ALKPHOS 95 01/27/2014 1014  ? AST 28 01/27/2014 1014  ? ALT 37 (H) 01/27/2014 1014  ? BILITOT 0.5 01/27/2014 1014  ?  ? ? ?Cc:  Seward Carol, MD ? ?

## 2021-08-19 ENCOUNTER — Ambulatory Visit (INDEPENDENT_AMBULATORY_CARE_PROVIDER_SITE_OTHER): Payer: Medicare HMO | Admitting: Neurology

## 2021-08-19 ENCOUNTER — Encounter: Payer: Self-pay | Admitting: Neurology

## 2021-08-19 ENCOUNTER — Other Ambulatory Visit: Payer: Self-pay

## 2021-08-19 VITALS — BP 146/79 | HR 78 | Ht 66.0 in | Wt 191.2 lb

## 2021-08-19 DIAGNOSIS — R251 Tremor, unspecified: Secondary | ICD-10-CM

## 2021-08-19 DIAGNOSIS — R202 Paresthesia of skin: Secondary | ICD-10-CM | POA: Diagnosis not present

## 2021-08-19 NOTE — Telephone Encounter (Signed)
I called her. As expected, hip injection not very helpful. She complains of mostly back and buttock pain. Can we set her up for repeat ESI with Dr Ernestina Patches to see how much improvement she gets from that? She does not want to try PT and just wants to do her aqua therapy exercises at the [facility]

## 2021-08-19 NOTE — Telephone Encounter (Signed)
Order placed

## 2021-08-31 DIAGNOSIS — Z6830 Body mass index (BMI) 30.0-30.9, adult: Secondary | ICD-10-CM | POA: Diagnosis not present

## 2021-08-31 DIAGNOSIS — M5136 Other intervertebral disc degeneration, lumbar region: Secondary | ICD-10-CM | POA: Diagnosis not present

## 2021-08-31 DIAGNOSIS — E559 Vitamin D deficiency, unspecified: Secondary | ICD-10-CM | POA: Diagnosis not present

## 2021-08-31 DIAGNOSIS — H2011 Chronic iridocyclitis, right eye: Secondary | ICD-10-CM | POA: Diagnosis not present

## 2021-08-31 DIAGNOSIS — M329 Systemic lupus erythematosus, unspecified: Secondary | ICD-10-CM | POA: Diagnosis not present

## 2021-08-31 DIAGNOSIS — R5383 Other fatigue: Secondary | ICD-10-CM | POA: Diagnosis not present

## 2021-08-31 DIAGNOSIS — M150 Primary generalized (osteo)arthritis: Secondary | ICD-10-CM | POA: Diagnosis not present

## 2021-08-31 DIAGNOSIS — M797 Fibromyalgia: Secondary | ICD-10-CM | POA: Diagnosis not present

## 2021-08-31 DIAGNOSIS — R21 Rash and other nonspecific skin eruption: Secondary | ICD-10-CM | POA: Diagnosis not present

## 2021-09-14 ENCOUNTER — Encounter: Payer: Self-pay | Admitting: Orthopedic Surgery

## 2021-09-14 ENCOUNTER — Ambulatory Visit (INDEPENDENT_AMBULATORY_CARE_PROVIDER_SITE_OTHER): Payer: No Typology Code available for payment source | Admitting: Orthopedic Surgery

## 2021-09-14 DIAGNOSIS — M25552 Pain in left hip: Secondary | ICD-10-CM | POA: Diagnosis not present

## 2021-09-14 NOTE — Progress Notes (Signed)
? ?Office Visit Note ?  ?Patient: Barbara Patton           ?Date of Birth: 1940-11-30           ?MRN: [id number] ?Visit Date: 09/14/2021 ?Requested by: Seward Carol, MD ?[address] ?[address] ?[place],  [place] [address] ?PCP: Seward Carol, MD ? ?Subjective: ?Chief Complaint  ?Patient presents with  ? Left Leg - Pain  ? ? ?HPI: Barbara Patton is a 81 year old patient with left lateral buttocks pain and radicular leg pain.  Also reports low back pain.  Denies any numbness and tingling.  She has had trochanteric and hip injection which did not help.  CBD does help.  Denies any groin symptoms.  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 ?             ?ROS: All systems reviewed are negative as they relate to the chief complaint within the history of present illness.  Patient denies  fevers or chills. ? ? ?Assessment & Plan: ?Visit Diagnoses:  ?1. Pain in left hip   ? ? ?Plan: Impression is left hip and back pain.  She has a back injection scheduled with Dr. Ernestina Patches.  She does have some mild arthritis on radiographs.  I think would be good to have Dr. Ernestina Patches to do a left hip injection 1 week after her back injection so we can figure out which is the more prominent pain generator. ? ?Follow-Up Instructions: Return if symptoms worsen or fail to improve.  ? ?Orders:  ?Orders Placed This Encounter  ?Procedures  ? Ambulatory referral to Physical Medicine Rehab  ? ?No orders of the defined types were placed in this encounter. ? ? ? ? Procedures: ?No procedures performed ? ? ?Clinical Data: ?No additional findings. ? ?Objective: ?Vital Signs: There were no vitals taken for this visit. ? ?Physical Exam:  ? ?Constitutional: Patient appears well-developed ?HEENT:  ?Head: Normocephalic ?Eyes:EOM are normal ?Neck: Normal range of motion ?Cardiovascular: Normal rate ?Pulmonary/chest: Effort normal ?Neurologic: Patient is alert ?Skin: Skin is warm ?Psychiatric: Patient has normal mood and affect ? ? ?Ortho Exam: Ortho exam  demonstrates normal leg lengths.  She has had revision knee replacements on both sides and there is no warmth or effusion in either knee.  Not too much groin pain with internal/external rotation of either leg.  She has 5 out of 5 ankle dorsiflexion plantarflexion hip abduction adduction and flexor strength also symmetric.  Mild tenderness on the trochanteric region on the left compared to the right.  She does have some pain with forward and lateral bending. ? ?Specialty Comments:  ?No specialty comments available. ? ?Imaging: ?No results found. ? ? ?PMFS History: ?Patient Active Problem List  ? Diagnosis Date Noted  ? 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  ? Referred otalgia of left ear 08/23/2015  ? 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  ? Allergic rhinitis 07/26/2015  ? Anxiety 07/26/2015  ? Benign essential hypertension 07/26/2015  ? Dysfunction of left eustachian tube 07/26/2015  ? Esophageal reflux 07/26/2015  ? Fibromyalgia 07/26/2015  ? Pure hypercholesterolemia 07/26/2015  ? 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 Pes anserinus bursitis of left knee 12/04/2013  ? Trochanteric bursitis of left hip 08/07/2013  ? Leg swelling 12/06/2011  ? Glaucoma 11/02/2011  ? HLD (hyperlipidemia) 11/02/2011  ? HTN (hypertension) 11/02/2011  ? Hypothyroid 11/02/2011  ? OA (osteoarthritis) 11/02/2011  ? Pain  due to total left knee replacement ([place]) 03/16/2011  ? Presence of artificial knee joint 03/16/2011  ? ?Past Medical History:  ?Diagnosis Date  ? Glaucoma   ? High cholesterol   ? Hypertension   ? Lupus ([place])   ? Thyroid disease   ?  ?Family History  ?Problem Relation Age of Onset  ? Cancer Father   ? Cancer Brother   ? Cancer Brother   ? Stroke Brother   ?  ?Past Surgical History:  ?Procedure Laterality Date  ? ABDOMINAL HYSTERECTOMY    ? CATARACT EXTRACTION, BILATERAL    ? TOTAL KNEE ARTHROPLASTY Bilateral   ? TUBAL  LIGATION    ? ?Social History  ? ?Occupational History  ? Not on file  ?Tobacco Use  ? Smoking status: Former  ?  Types: Cigarettes  ?  Quit date: 1997  ?  Years since quitting: 26.2  ? Smokeless tobacco: Never  ?Vaping Use  ? Vaping Use: Never used  ?Substance and Sexual Activity  ? Alcohol use: Yes  ?  Alcohol/week: 3.0 standard drinks  ?  Types: 3 Glasses of wine per week  ?  Comment: occ wine  ? Drug use: No  ? Sexual activity: Not on file  ? ? ? ? ? ?

## 2021-09-16 ENCOUNTER — Telehealth: Payer: Self-pay | Admitting: Physical Medicine and Rehabilitation

## 2021-09-16 NOTE — Telephone Encounter (Signed)
Patient called asked for a call back to schedule an appointment with Dr. Ernestina Patches. Patient said she was referred to Dr. Ernestina Patches. The number to contact patient is [phone] ?

## 2021-09-19 NOTE — Telephone Encounter (Signed)
Patient called in about referral for back injection  ?

## 2021-09-21 ENCOUNTER — Encounter: Payer: Self-pay | Admitting: Physical Medicine and Rehabilitation

## 2021-09-21 ENCOUNTER — Ambulatory Visit (INDEPENDENT_AMBULATORY_CARE_PROVIDER_SITE_OTHER): Payer: No Typology Code available for payment source | Admitting: Physical Medicine and Rehabilitation

## 2021-09-21 VITALS — BP 148/68 | HR 89

## 2021-09-21 DIAGNOSIS — M48062 Spinal stenosis, lumbar region with neurogenic claudication: Secondary | ICD-10-CM

## 2021-09-21 DIAGNOSIS — M5416 Radiculopathy, lumbar region: Secondary | ICD-10-CM

## 2021-09-21 DIAGNOSIS — M797 Fibromyalgia: Secondary | ICD-10-CM

## 2021-09-21 DIAGNOSIS — M4726 Other spondylosis with radiculopathy, lumbar region: Secondary | ICD-10-CM

## 2021-09-21 NOTE — Progress Notes (Signed)
Pt state lower back pain that travels to her left hip. Pt state getting out of bed and walking makes the pain worse. Pt state she has to use a cain to help walking around when the pain comes. Pt state she takes over the counter pain meds to help ease her pain. ? ?Numeric Pain Rating Scale and Functional Assessment ?Average Pain 10 ?Pain Right Now 5 ?My pain is intermittent, sharp, stabbing, and aching ?Pain is worse with: walking, bending, standing, some activites, and laying down ?Pain improves with: rest, medication, and injections ? ? ?In the last MONTH (on 0-10 scale) has pain interfered with the following? ? ?1. General activity like being  able to carry out your everyday physical activities such as walking, climbing stairs, carrying groceries, or moving a chair?  ?Rating(5) ? ?2. Relation with others like being able to carry out your usual social activities and roles such as  activities at home, at work and in your community. ?Rating(6) ? ?3. Enjoyment of life such that you have  been bothered by emotional problems such as feeling anxious, depressed or irritable?  ?Rating(7) ? ?

## 2021-09-21 NOTE — Progress Notes (Signed)
? ?Barbara Patton - 81 y.o. female MRN [id number]  Date of birth: 08-Jul-1940 ? ?Office Visit Note: ?Visit Date: 09/21/2021 ?PCP: Seward Carol, MD ?Referred by: Seward Carol, MD ? ?Subjective: ?Chief Complaint  ?Patient presents with  ? Lower Back - Pain  ? Left Hip - Pain  ? ?HPI: Barbara Patton is a 81 y.o. female who comes in today for evaluation of chronic, worsening and severe bilateral lower back pain radiating to buttocks and lateral thighs, left greater than right. Patient reports pain has been ongoing for several years. Her pain is exacerbated by movement and activity, she describes as constant sore and aching sensation, currently rates as 8 out of 10. Patient also reports diffuse pain and tenderness all over body, states she was more recently diagnosed with fibromyalgia. Patient reports some relief of pain with home exercise regimen, rest and use of over the counter medications as needed. Patient has completed a regimen of physical therapy in 2022 that did include dry needling treatments with out in house team and reports some relief of pain. Patient states she is very active person and does exercise at [facility] multiple times a week, she also participates in bowling league. 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 Patient had left L5-S1 interlaminar epidural steroid injection in our office on 06/28/2021 and reports significant relief of pain for several months with this procedure. Patient was recently evaluated by Dr. Alphonzo Severance on 08/12/2021 where she underwent left hip injection, she reports little to no relief of pain with this injection. Patient denies focal weakness, numbness and tingling. Patient denies recent trauma or falls.  ? ?Review of Systems  ?Musculoskeletal:  Positive for back pain and myalgias.  ?Neurological:  Negative for tingling, sensory change, focal weakness and weakness.  ?All other systems reviewed and are  negative. Otherwise per HPI. ? ?Assessment & Plan: ?Visit Diagnoses:  ?  ICD-10-CM   ?1. Lumbar radiculopathy  M54.16 Ambulatory referral to Physical Medicine Rehab  ?  ?2. Other spondylosis with radiculopathy, lumbar region  M47.26   ?  ?3. Spinal stenosis of lumbar region with neurogenic claudication  M48.062   ?  ?4. Fibromyalgia  M79.7   ?  ?   ?Plan: Findings:  ?Chronic, worsening and severe bilateral lower back pain radiating to buttocks and lateral thighs, left greater than right. Patient continues to have severe pain despite good conservative therapies such as formal physical therapy, home exercise regimen, rest and use of medications. Patients clinical presentation and exam are consistent with L5 nerve pattern. We also feel fibromyalgia could be working to exacerbate her symptoms. We believe the next step is to repeat left L5-S1 interlaminar epidural steroid injection under fluoroscopic guidance. If patients pain persists after epidural steroid injection we would consider performing left intra-articular hip injection if warranted. Patient encouraged to remain active and to continue conservative therapies as needed. No red flag symptoms noted upon exam today.   ? ?Meds & Orders: No orders of the defined types were placed in this encounter. ?  ?Orders Placed This Encounter  ?Procedures  ? Ambulatory referral to Physical Medicine Rehab  ?  ?Follow-up: Return for Left L5-S1 interlaminar epidural steroid injection.  ? ?Procedures: ?No procedures performed  ?   ? ?Clinical History: ?No specialty comments available.  ? ?She reports that she quit smoking about 26 years ago. Her smoking use included cigarettes. She has never used smokeless tobacco. No results  for input(s): HGBA1C, LABURIC in the last 8760 hours. ? ?Objective:  VS:  HT:    WT:   BMI:     BP:(!) 148/68  HR:89bpm  TEMP: ( )  RESP:  ?Physical Exam ?Vitals and nursing note reviewed.  ?HENT:  ?   Head: Normocephalic and atraumatic.  ?   Right Ear:  External ear normal.  ?   Left Ear: External ear normal.  ?   Nose: Nose normal.  ?   Mouth/Throat:  ?   Mouth: Mucous membranes are moist.  ?Eyes:  ?   Extraocular Movements: Extraocular movements intact.  ?Cardiovascular:  ?   Rate and Rhythm: Normal rate.  ?   Pulses: Normal pulses.  ?Pulmonary:  ?   Effort: Pulmonary effort is normal.  ?Abdominal:  ?   General: Abdomen is flat. There is no distension.  ?Musculoskeletal:     ?   General: Tenderness present.  ?   Cervical back: Normal range of motion.  ?   Comments: Pt rises from seated position to standing without difficulty. Good lumbar range of motion. Strong distal strength without clonus, no pain upon palpation of greater trochanters. Dysesthesias noted to bilateral L5 dermatomes. Sensation intact bilaterally. Walks independently, gait steady.   ?Skin: ?   General: Skin is warm and dry.  ?   Capillary Refill: Capillary refill takes less than 2 seconds.  ?Neurological:  ?   General: No focal deficit present.  ?   Mental Status: She is alert and oriented to person, place, and time.  ?Psychiatric:     ?   Mood and Affect: Mood normal.     ?   Behavior: Behavior normal.  ?  ?Ortho Exam ? ?Imaging: ?No results found. ? ?Past Medical/Family/Surgical/Social History: ?Medications & Allergies reviewed per EMR, new medications updated. ?Patient Active Problem List  ? Diagnosis Date Noted  ? 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  ? Referred otalgia of left ear 08/23/2015  ? 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  ? Allergic rhinitis 07/26/2015  ? Anxiety 07/26/2015  ? Benign essential hypertension 07/26/2015  ? Dysfunction of left eustachian tube 07/26/2015  ? Esophageal reflux 07/26/2015  ? Fibromyalgia 07/26/2015  ? Pure hypercholesterolemia 07/26/2015  ? 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 Pes anserinus bursitis of left knee 12/04/2013  ? Trochanteric bursitis  of left hip 08/07/2013  ? Leg swelling 12/06/2011  ? Glaucoma 11/02/2011  ? HLD (hyperlipidemia) 11/02/2011  ? HTN (hypertension) 11/02/2011  ? Hypothyroid 11/02/2011  ? OA (osteoarthritis) 11/02/2011  ? 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 ([place]) 03/16/2011  ? Presence of artificial knee joint 03/16/2011  ? ?Past Medical History:  ?Diagnosis Date  ? Glaucoma   ? High cholesterol   ? Hypertension   ? Lupus ([place])   ? Thyroid disease   ? ?Family History  ?Problem Relation Age of Onset  ? Cancer Father   ? Cancer Brother   ? Cancer Brother   ? Stroke Brother   ? ?Past Surgical History:  ?Procedure Laterality Date  ? ABDOMINAL HYSTERECTOMY    ? CATARACT EXTRACTION, BILATERAL    ? TOTAL KNEE ARTHROPLASTY Bilateral   ? TUBAL LIGATION    ? ?Social History  ? ?Occupational History  ? Not on file  ?Tobacco Use  ? Smoking status: Former  ?  Types: Cigarettes  ?  Quit date: 1997  ?  Years  since quitting: 26.3  ? Smokeless tobacco: Never  ?Vaping Use  ? Vaping Use: Never used  ?Substance and Sexual Activity  ? Alcohol use: Yes  ?  Alcohol/week: 3.0 standard drinks  ?  Types: 3 Glasses of wine per week  ?  Comment: occ wine  ? Drug use: No  ? Sexual activity: Not on file  ? ?

## 2021-09-22 ENCOUNTER — Other Ambulatory Visit: Payer: Self-pay | Admitting: Internal Medicine

## 2021-09-22 ENCOUNTER — Ambulatory Visit
Admission: RE | Admit: 2021-09-22 | Discharge: 2021-09-22 | Disposition: A | Discharge status: Home / Self Care | Payer: No Typology Code available for payment source | Source: Ambulatory Visit | Attending: Internal Medicine | Admitting: Internal Medicine

## 2021-09-22 DIAGNOSIS — M797 Fibromyalgia: Secondary | ICD-10-CM | POA: Diagnosis not present

## 2021-09-22 DIAGNOSIS — R0989 Other specified symptoms and signs involving the circulatory and respiratory systems: Secondary | ICD-10-CM | POA: Diagnosis not present

## 2021-09-22 DIAGNOSIS — L749 Eccrine sweat disorder, unspecified: Secondary | ICD-10-CM

## 2021-09-22 DIAGNOSIS — R61 Generalized hyperhidrosis: Secondary | ICD-10-CM | POA: Diagnosis not present

## 2021-09-22 DIAGNOSIS — G8929 Other chronic pain: Secondary | ICD-10-CM | POA: Diagnosis not present

## 2021-09-28 DIAGNOSIS — Z961 Presence of intraocular lens: Secondary | ICD-10-CM | POA: Diagnosis not present

## 2021-09-28 DIAGNOSIS — H401132 Primary open-angle glaucoma, bilateral, moderate stage: Secondary | ICD-10-CM | POA: Diagnosis not present

## 2021-10-18 ENCOUNTER — Ambulatory Visit: Payer: Self-pay

## 2021-10-18 ENCOUNTER — Ambulatory Visit (INDEPENDENT_AMBULATORY_CARE_PROVIDER_SITE_OTHER): Payer: Medicare HMO | Admitting: Physical Medicine and Rehabilitation

## 2021-10-18 VITALS — BP 174/74 | HR 51

## 2021-10-18 DIAGNOSIS — M7062 Trochanteric bursitis, left hip: Secondary | ICD-10-CM | POA: Diagnosis not present

## 2021-10-18 DIAGNOSIS — M5416 Radiculopathy, lumbar region: Secondary | ICD-10-CM

## 2021-10-18 DIAGNOSIS — M7061 Trochanteric bursitis, right hip: Secondary | ICD-10-CM

## 2021-10-18 MED ORDER — METHYLPREDNISOLONE ACETATE 80 MG/ML IJ SUSP: 80.0000 mg | Freq: Once | INTRAMUSCULAR | Status: DC

## 2021-10-18 NOTE — Progress Notes (Signed)
Left L5-S1 interlaminar epidural steroid injection  Does not have a driver today  No blood thinner

## 2021-10-20 ENCOUNTER — Ambulatory Visit (INDEPENDENT_AMBULATORY_CARE_PROVIDER_SITE_OTHER): Payer: Medicare HMO

## 2021-10-20 ENCOUNTER — Ambulatory Visit (INDEPENDENT_AMBULATORY_CARE_PROVIDER_SITE_OTHER): Payer: Medicare HMO | Admitting: Podiatry

## 2021-10-20 ENCOUNTER — Encounter: Payer: Self-pay | Admitting: Podiatry

## 2021-10-20 ENCOUNTER — Telehealth: Payer: Self-pay | Admitting: Podiatry

## 2021-10-20 DIAGNOSIS — M2042 Other hammer toe(s) (acquired), left foot: Secondary | ICD-10-CM | POA: Diagnosis not present

## 2021-10-20 DIAGNOSIS — N320 Bladder-neck obstruction: Secondary | ICD-10-CM | POA: Insufficient documentation

## 2021-10-20 DIAGNOSIS — Z860100 Personal history of colon polyps, unspecified: Secondary | ICD-10-CM | POA: Insufficient documentation

## 2021-10-20 DIAGNOSIS — M2010 Hallux valgus (acquired), unspecified foot: Secondary | ICD-10-CM | POA: Diagnosis not present

## 2021-10-20 DIAGNOSIS — M79676 Pain in unspecified toe(s): Secondary | ICD-10-CM | POA: Diagnosis not present

## 2021-10-20 DIAGNOSIS — R32 Unspecified urinary incontinence: Secondary | ICD-10-CM | POA: Insufficient documentation

## 2021-10-20 DIAGNOSIS — G252 Other specified forms of tremor: Secondary | ICD-10-CM | POA: Insufficient documentation

## 2021-10-20 DIAGNOSIS — M549 Dorsalgia, unspecified: Secondary | ICD-10-CM | POA: Insufficient documentation

## 2021-10-20 DIAGNOSIS — D649 Anemia, unspecified: Secondary | ICD-10-CM | POA: Insufficient documentation

## 2021-10-20 DIAGNOSIS — K5900 Constipation, unspecified: Secondary | ICD-10-CM | POA: Insufficient documentation

## 2021-10-20 DIAGNOSIS — N951 Menopausal and female climacteric states: Secondary | ICD-10-CM | POA: Insufficient documentation

## 2021-10-20 DIAGNOSIS — M7751 Other enthesopathy of right foot: Secondary | ICD-10-CM | POA: Diagnosis not present

## 2021-10-20 DIAGNOSIS — M778 Other enthesopathies, not elsewhere classified: Secondary | ICD-10-CM | POA: Diagnosis not present

## 2021-10-20 DIAGNOSIS — M2041 Other hammer toe(s) (acquired), right foot: Secondary | ICD-10-CM | POA: Diagnosis not present

## 2021-10-20 DIAGNOSIS — E663 Overweight: Secondary | ICD-10-CM | POA: Insufficient documentation

## 2021-10-20 DIAGNOSIS — B351 Tinea unguium: Secondary | ICD-10-CM | POA: Diagnosis not present

## 2021-10-20 DIAGNOSIS — M7752 Other enthesopathy of left foot: Secondary | ICD-10-CM

## 2021-10-20 MED ORDER — DEXAMETHASONE SODIUM PHOSPHATE 120 MG/30ML IJ SOLN
8.0000 mg | Freq: Once | INTRAMUSCULAR | Status: AC
  Administered 2021-10-20: 8 mg via INTRA_ARTICULAR

## 2021-10-20 NOTE — Telephone Encounter (Signed)
Patient called back to let Dr Milinda Pointer know that Barbara Patton is her Rheumatologist

## 2021-10-20 NOTE — Progress Notes (Signed)
Subjective:  Patient ID: Barbara Patton, female    DOB: 01-01-1941,  MRN: [id number] HPI Chief Complaint  Patient presents with   Foot Pain    1st MPJ and toes bilateral - bunion and HT deformity x years, shoes are rubbing and more pain with walking now, does have fibromyalgia   New Patient (Initial Visit)    81 y.o. female presents with the above complaint.   ROS: Denies fever chills nausea vomiting muscle aches pains calf pain back pain chest pain shortness of breath.  Past Medical History:  Diagnosis Date   Glaucoma    High cholesterol    Hypertension    Lupus ([place])    Thyroid disease    Past Surgical History:  Procedure Laterality Date   ABDOMINAL HYSTERECTOMY     CATARACT EXTRACTION, BILATERAL     TOTAL KNEE ARTHROPLASTY Bilateral    TUBAL LIGATION      Current Outpatient Medications:    acetaminophen (TYLENOL) 325 MG tablet, Take 650 mg by mouth 2 (two) times daily as needed (pain)., Disp: , Rfl:    betamethasone dipropionate (DIPROLENE) 0.05 % ointment, Apply 1 application topically 2 (two) times daily as needed (dry skin irritation). , Disp: , Rfl:    Cholecalciferol (VITAMIN D-3 PO), Take 1 tablet by mouth daily., Disp: , Rfl:    Coenzyme Q10 (CO Q 10 PO), Take 1 tablet by mouth daily., Disp: , Rfl:    diclofenac Sodium (VOLTAREN) 1 % GEL, Add'l Sig Add'l Sig topical Add'l Sig, Disp: , Rfl:    DULoxetine (CYMBALTA) 30 MG capsule, , Disp: , Rfl:    furosemide (LASIX) 40 MG tablet, Take 40 mg by mouth daily., Disp: , Rfl:    hydrochlorothiazide (HYDRODIURIL) 25 MG tablet, Take 25 mg by mouth daily., Disp: , Rfl:    hydroxychloroquine (PLAQUENIL) 200 MG tablet, Take 200 mg by mouth daily. , Disp: , Rfl:    hydrOXYzine (ATARAX/VISTARIL) 10 MG tablet, Take 10 mg by mouth every 6 (six) hours as needed for itching or anxiety. , Disp: , Rfl:    levothyroxine (SYNTHROID) 100 MCG tablet, Take 100 mcg by mouth daily., Disp: , Rfl:    levothyroxine (SYNTHROID, LEVOTHROID) 112  MCG tablet, Take 112 mcg by mouth daily. , Disp: , Rfl:    methocarbamol (ROBAXIN) 500 MG tablet, Take 1 tablet (500 mg total) by mouth 2 (two) times daily as needed., Disp: 180 tablet, Rfl: 0   Multiple Vitamins-Minerals (MULTIVITAMIN PO), Take 1 tablet by mouth daily. , Disp: , Rfl:    omeprazole (PRILOSEC) 40 MG capsule, Take 40 mg by mouth daily. , Disp: , Rfl:    oxybutynin (DITROPAN) 5 MG tablet, Take 5 mg by mouth 3 (three) times daily. , Disp: , Rfl:    simvastatin (ZOCOR) 20 MG tablet, Take 20 mg by mouth every evening. , Disp: , Rfl:    timolol (TIMOPTIC) 0.5 % ophthalmic solution, Place 1 drop into both eyes 2 (two) times daily., Disp: , Rfl:    tolterodine (DETROL LA) 4 MG 24 hr capsule, , Disp: , Rfl: 2   traMADol (ULTRAM) 50 MG tablet, Take 50 mg by mouth 2 (two) times daily as needed (pain)., Disp: , Rfl:   Current Facility-Administered Medications:    methylPREDNISolone acetate (DEPO-MEDROL) injection 80 mg, 80 mg, Other, Once, Magnus Sinning, MD  Allergies  Allergen Reactions   Codeine Other (See Comments)    Kept awake   Penicillins Hives    Did it involve  swelling of the face/tongue/throat, SOB, or low BP? N Did it involve sudden or severe rash/hives, skin peeling, or any reaction on the inside of your mouth or nose? Y Did you need to seek medical attention at a hospital or doctor's office? N When did it last happen?   Over 10 Years Ago    If all above answers are "NO", may proceed with cephalosporin use.    Review of Systems Objective:  There were no vitals filed for this visit.  General: Well developed, nourished, in no acute distress, alert and oriented x3   Dermatological: Skin is warm, dry and supple bilateral. Nails x 10 are thick yellow dystrophic clinically mycotic painful palpation.  Remaining integument appears unremarkable at this time. There are no open sores, no preulcerative lesions, no rash or signs of infection present.  Vascular: Dorsalis Pedis  artery and Posterior Tibial artery pedal pulses are 2/4 bilateral with immedate capillary fill time. Pedal hair growth present. No varicosities and no lower extremity edema present bilateral.   Neruologic: Grossly intact via light touch bilateral. Vibratory intact via tuning fork bilateral. Protective threshold with Semmes Wienstein monofilament intact to all pedal sites bilateral. Patellar and Achilles deep tendon reflexes 2+ bilateral. No Babinski or clonus noted bilateral.   Musculoskeletal: No gross boney pedal deformities bilateral. No pain, crepitus, or limitation noted with foot and ankle range of motion bilateral. Muscular strength 5/5 in all groups tested bilateral.  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  She has pain in the second and third metatarsal phalangeal joint areas.  And into the toes.  Gait: Unassisted, Nonantalgic.    Radiographs:  Radiographs demonstrate significant osteoarthritis with pes planus bilateral heel spurs bilateral midfoot osteoarthritic changes forefoot hallux valgus changes with osteoarthritis of the first metatarsophalangeal joints and of the lesser digits PIPJ and DIPJ's and joint space narrowing of the lesser metatarsophalangeal joints as well.  Assessment & Plan:   Assessment: Pain in limb secondary to onychomycosis.  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  Plan: Injected the bursitis and tendinitis as well as the joints of the lesser toes.  2 mg dexamethasone was utilized to each injection to the injections bilaterally.  Also debrided toenails 1 through 5 bilateral.     Tenna Lacko T. [place], [place]

## 2021-10-26 DIAGNOSIS — R69 Illness, unspecified: Secondary | ICD-10-CM | POA: Diagnosis not present

## 2021-11-01 ENCOUNTER — Telehealth: Payer: Self-pay | Admitting: Physical Medicine and Rehabilitation

## 2021-11-01 ENCOUNTER — Other Ambulatory Visit: Payer: Self-pay | Admitting: Physical Medicine and Rehabilitation

## 2021-11-01 NOTE — Telephone Encounter (Signed)
Pt called back and it has been two weeks since the injection. She still in a lot pain. She wants to know what's the next step.   Cb [phone]

## 2021-11-01 NOTE — Telephone Encounter (Signed)
Pt is in a lot of pain. I have her scheduled for this Thursday. Can someone call her to tell her what to do in the meantime?   CB [phone]

## 2021-11-02 DIAGNOSIS — R040 Epistaxis: Secondary | ICD-10-CM | POA: Diagnosis not present

## 2021-11-02 DIAGNOSIS — H903 Sensorineural hearing loss, bilateral: Secondary | ICD-10-CM | POA: Diagnosis not present

## 2021-11-02 DIAGNOSIS — Z780 Asymptomatic menopausal state: Secondary | ICD-10-CM | POA: Diagnosis not present

## 2021-11-02 MED ORDER — TRIAMCINOLONE ACETONIDE 40 MG/ML IJ SUSP
40.0000 mg | INTRAMUSCULAR | Status: AC | PRN
  Administered 2021-10-18: 40 mg via INTRA_ARTICULAR

## 2021-11-02 MED ORDER — BUPIVACAINE HCL 0.5 % IJ SOLN
5.0000 mL | INTRAMUSCULAR | Status: AC | PRN
  Administered 2021-10-18: 5 mL via INTRA_ARTICULAR

## 2021-11-02 NOTE — Progress Notes (Signed)
   Barbara Patton - 81 y.o. female MRN [id number]  Date of birth: 09-Dec-1940  Office Visit Note: Visit Date: 10/18/2021 PCP: Seward Carol, MD Referred by: Seward Carol, MD  Subjective: Chief Complaint  Patient presents with   Lower Back - Pain   HPI:  Barbara Patton is a 81 y.o. female who comes in today for planned lumbar epidural injection at the request of Dr. Anderson Malta.  Ms. Kouns is well-known to [place] and unfortunately has fibromyalgia which really clouds her pain issues greatly.  She still stays active with bowling and [facility] [employer] etc[facility] which is very good for her but she just has pain complaints that continue to worsen overall really with pain in all quadrants of the body from the neck down to the arms and back and legs.  She has known hip osteoarthritis and a history of multiple greater trochanter injections.  She does get some relief with these overall but really has been cautioned on the use of cortisone injections and the fact that we just cannot do those all the time.  She unfortunately comes today without a driver which we do require for epidural injections.  Is confusing her because she has had other injections in the past that do not require driver.  Given this fact we are going to complete greater trochanter injections today.  If she does not get relief with that then I would look at potentially epidural injection versus hip injection.  We do need to again caution her on the fact that the fibromyalgia is part of the problem and we just cannot keep doing cortisone injections frequently.  ROS Otherwise per HPI.  Assessment & Plan: Visit Diagnoses:    ICD-10-CM   1. Lumbar radiculopathy  M54.16 XR C-ARM NO REPORT    Epidural Steroid injection    methylPREDNISolone acetate (DEPO-MEDROL) injection 80 mg      Plan: No additional findings.   Meds & Orders:  Meds ordered this encounter  Medications   methylPREDNISolone acetate (DEPO-MEDROL) injection 80 mg    Orders  Placed This Encounter  Procedures   Large Joint Inj   XR C-ARM NO REPORT   Epidural Steroid injection    Follow-up: Return if symptoms worsen or fail to improve.   Procedures: Large Joint Inj: bilateral greater trochanter on 10/18/2021 3:15 PM Indications: pain and diagnostic evaluation Details: 22 G 3.5 in needle, lateral approach  Arthrogram: No  Medications (Right): 40 mg triamcinolone acetonide 40 MG/ML; 5 mL bupivacaine 0.5 % Medications (Left): 40 mg triamcinolone acetonide 40 MG/ML; 5 mL bupivacaine 0.5 % Outcome: tolerated well, no immediate complications  Greatest area of pain over the greater trochanter was palpated and marked prior to injection. The patient did seem to have relief after the injection. Procedure, treatment alternatives, risks and benefits explained, specific risks discussed. Consent was given by the patient. Immediately prior to procedure a time out was called to verify the correct patient, procedure, equipment, support staff and site/side marked as required. Patient was prepped and draped in the usual sterile fashion.         Clinical History: No specialty comments available.     Objective:  VS:  HT:    WT:   BMI:     BP:(!) 174/74  HR:(!) 51bpm  TEMP: ( )  RESP:  Physical Exam   Imaging: No results found.

## 2021-11-03 ENCOUNTER — Ambulatory Visit (INDEPENDENT_AMBULATORY_CARE_PROVIDER_SITE_OTHER): Payer: Medicare HMO | Admitting: Physical Medicine and Rehabilitation

## 2021-11-03 ENCOUNTER — Ambulatory Visit: Payer: Self-pay

## 2021-11-03 ENCOUNTER — Telehealth: Payer: Self-pay | Admitting: Physical Medicine and Rehabilitation

## 2021-11-03 DIAGNOSIS — M5416 Radiculopathy, lumbar region: Secondary | ICD-10-CM | POA: Diagnosis not present

## 2021-11-03 MED ORDER — METHYLPREDNISOLONE ACETATE 80 MG/ML IJ SUSP
80.0000 mg | Freq: Once | INTRAMUSCULAR | Status: AC
  Administered 2021-11-03: 80 mg

## 2021-11-03 NOTE — Telephone Encounter (Signed)
Pt states she had injection today and still experiencing pain. If pains persist tomorrow what does she need to do. Please call pt about this matter at [phone].

## 2021-11-03 NOTE — Progress Notes (Signed)
Numeric Pain Rating Scale and Functional Assessment Average Pain 10   In the last MONTH (on 0-10 scale) has pain interfered with the following?  1. General activity like being  able to carry out your everyday physical activities such as walking, climbing stairs, carrying groceries, or moving a chair?  Rating(10)   +Driver, -BT, -Dye Allergies.  Some pain in low back, points to pain in left buttock, left lateral hip and lateral knee.

## 2021-11-07 DIAGNOSIS — R338 Other retention of urine: Secondary | ICD-10-CM | POA: Diagnosis not present

## 2021-11-11 DIAGNOSIS — R338 Other retention of urine: Secondary | ICD-10-CM | POA: Diagnosis not present

## 2021-11-11 DIAGNOSIS — N139 Obstructive and reflux uropathy, unspecified: Secondary | ICD-10-CM | POA: Diagnosis not present

## 2021-11-17 ENCOUNTER — Telehealth: Payer: Self-pay | Admitting: Physical Medicine and Rehabilitation

## 2021-11-17 NOTE — Telephone Encounter (Signed)
Pt called requesting a call back. Pt states injection did not work. Please call pt at [phone].

## 2021-11-21 DIAGNOSIS — I10 Essential (primary) hypertension: Secondary | ICD-10-CM | POA: Diagnosis not present

## 2021-11-21 DIAGNOSIS — M329 Systemic lupus erythematosus, unspecified: Secondary | ICD-10-CM | POA: Diagnosis not present

## 2021-11-21 DIAGNOSIS — Z23 Encounter for immunization: Secondary | ICD-10-CM | POA: Diagnosis not present

## 2021-11-21 DIAGNOSIS — M797 Fibromyalgia: Secondary | ICD-10-CM | POA: Diagnosis not present

## 2021-11-21 DIAGNOSIS — I5189 Other ill-defined heart diseases: Secondary | ICD-10-CM | POA: Diagnosis not present

## 2021-11-21 DIAGNOSIS — E7800 Pure hypercholesterolemia, unspecified: Secondary | ICD-10-CM | POA: Diagnosis not present

## 2021-11-21 DIAGNOSIS — Z79899 Other long term (current) drug therapy: Secondary | ICD-10-CM | POA: Diagnosis not present

## 2021-11-21 DIAGNOSIS — Z0000 Encounter for general adult medical examination without abnormal findings: Secondary | ICD-10-CM | POA: Diagnosis not present

## 2021-11-21 DIAGNOSIS — Z5181 Encounter for therapeutic drug level monitoring: Secondary | ICD-10-CM | POA: Diagnosis not present

## 2021-11-21 DIAGNOSIS — R339 Retention of urine, unspecified: Secondary | ICD-10-CM | POA: Diagnosis not present

## 2021-11-21 DIAGNOSIS — E039 Hypothyroidism, unspecified: Secondary | ICD-10-CM | POA: Diagnosis not present

## 2021-11-21 DIAGNOSIS — Z1331 Encounter for screening for depression: Secondary | ICD-10-CM | POA: Diagnosis not present

## 2021-11-21 DIAGNOSIS — G8929 Other chronic pain: Secondary | ICD-10-CM | POA: Diagnosis not present

## 2021-11-22 NOTE — Procedures (Signed)
Lumbosacral Transforaminal Epidural Steroid Injection - Sub-Pedicular Approach with Fluoroscopic Guidance  Patient: Barbara Patton      Date of Birth: [birth year] MRN: [id number] PCP: Seward Carol, MD      Visit Date: 11/03/2021   Universal Protocol:    Date/Time: 11/03/2021  Consent Given By: the patient  Position: PRONE  Additional Comments: Vital signs were monitored before and after the procedure. Patient was prepped and draped in the usual sterile fashion. The correct patient, procedure, and site was verified.   Injection Procedure Details:   Procedure diagnoses: Lumbar radiculopathy [M54.16]    Meds Administered:  Meds ordered this encounter  Medications   methylPREDNISolone acetate (DEPO-MEDROL) injection 80 mg    Laterality: Left  Location/Site: L5  Needle:5.0 in., 22 ga.  Short bevel or Quincke spinal needle  Needle Placement: Transforaminal  Findings:    -Comments: Excellent flow of contrast along the nerve, nerve root and into the epidural space.  Procedure Details: After squaring off the end-plates to get a true AP view, the C-arm was positioned so that an oblique view of the foramen as noted above was visualized. The target area is just inferior to the "nose of the scotty dog" or sub pedicular. The soft tissues overlying this structure were infiltrated with 2-3 ml. of 1% Lidocaine without Epinephrine.  The spinal needle was inserted toward the target using a "trajectory" view along the fluoroscope beam.  Under AP and lateral visualization, the needle was advanced so it did not puncture dura and was located close the 6 O'Clock position of the pedical in AP tracterory. Biplanar projections were used to confirm position. Aspiration was confirmed to be negative for CSF and/or blood. A 1-2 ml. volume of Isovue-250 was injected and flow of contrast was noted at each level. Radiographs were obtained for documentation purposes.   After attaining the desired flow  of contrast documented above, a 0.5 to 1.0 ml test dose of 0.25% Marcaine was injected into each respective transforaminal space.  The patient was observed for 90 seconds post injection.  After no sensory deficits were reported, and normal lower extremity motor function was noted,   the above injectate was administered so that equal amounts of the injectate were placed at each foramen (level) into the transforaminal epidural space.   Additional Comments:  No complications occurred Dressing: 2 x 2 sterile gauze and Band-Aid    Post-procedure details: Patient was observed during the procedure. Post-procedure instructions were reviewed.  Patient left the clinic in stable condition.

## 2021-11-22 NOTE — Progress Notes (Signed)
Barbara Patton - 81 y.o. female MRN [id number]  Date of birth: 1941-05-01  Office Visit Note: Visit Date: 11/03/2021 PCP: Seward Carol, MD Referred by: Magnus Sinning, MD  Subjective: Chief Complaint  Patient presents with   Left Hip - Pain   HPI:  Barbara Patton is a 81 y.o. female who comes in today  for planned Left L5-S1 Lumbar Transforaminal epidural steroid injection with fluoroscopic guidance.  The patient has failed conservative care including home exercise, medications, time and activity modification.  This injection will be diagnostic and hopefully therapeutic.  Please see requesting physician notes for further details and justification.   ROS Otherwise per HPI.  Assessment & Plan: Visit Diagnoses:    ICD-10-CM   1. Lumbar radiculopathy  M54.16 XR C-ARM NO REPORT    Epidural Steroid injection    methylPREDNISolone acetate (DEPO-MEDROL) injection 80 mg      Plan: No additional findings.   Meds & Orders:  Meds ordered this encounter  Medications   methylPREDNISolone acetate (DEPO-MEDROL) injection 80 mg    Orders Placed This Encounter  Procedures   XR C-ARM NO REPORT   Epidural Steroid injection    Follow-up: Return if symptoms worsen or fail to improve.   Procedures: No procedures performed  Lumbosacral Transforaminal Epidural Steroid Injection - Sub-Pedicular Approach with Fluoroscopic Guidance  Patient: Barbara Patton      Date of Birth: 1940-06-26 MRN: [id number] PCP: Seward Carol, MD      Visit Date: 11/03/2021   Universal Protocol:    Date/Time: 11/03/2021  Consent Given By: the patient  Position: PRONE  Additional Comments: Vital signs were monitored before and after the procedure. Patient was prepped and draped in the usual sterile fashion. The correct patient, procedure, and site was verified.   Injection Procedure Details:   Procedure diagnoses: Lumbar radiculopathy [M54.16]    Meds Administered:  Meds ordered this  encounter  Medications   methylPREDNISolone acetate (DEPO-MEDROL) injection 80 mg    Laterality: Left  Location/Site: L5  Needle:5.0 in., 22 ga.  Short bevel or Quincke spinal needle  Needle Placement: Transforaminal  Findings:    -Comments: Excellent flow of contrast along the nerve, nerve root and into the epidural space.  Procedure Details: After squaring off the end-plates to get a true AP view, the C-arm was positioned so that an oblique view of the foramen as noted above was visualized. The target area is just inferior to the "nose of the scotty dog" or sub pedicular. The soft tissues overlying this structure were infiltrated with 2-3 ml. of 1% Lidocaine without Epinephrine.  The spinal needle was inserted toward the target using a "trajectory" view along the fluoroscope beam.  Under AP and lateral visualization, the needle was advanced so it did not puncture dura and was located close the 6 O'Clock position of the pedical in AP tracterory. Biplanar projections were used to confirm position. Aspiration was confirmed to be negative for CSF and/or blood. A 1-2 ml. volume of Isovue-250 was injected and flow of contrast was noted at each level. Radiographs were obtained for documentation purposes.   After attaining the desired flow of contrast documented above, a 0.5 to 1.0 ml test dose of 0.25% Marcaine was injected into each respective transforaminal space.  The patient was observed for 90 seconds post injection.  After no sensory deficits were reported, and normal lower extremity motor function was noted,   the above injectate was administered so that equal amounts of the injectate  were placed at each foramen (level) into the transforaminal epidural space.   Additional Comments:  No complications occurred Dressing: 2 x 2 sterile gauze and Band-Aid    Post-procedure details: Patient was observed during the procedure. Post-procedure instructions were reviewed.  Patient left the  clinic in stable condition.    Clinical History: No specialty comments available.     Objective:  VS:  HT:    WT:   BMI:     BP:   HR: bpm  TEMP: ( )  RESP:  Physical Exam Vitals and nursing note reviewed.  Constitutional:      General: She is not in acute distress.    Appearance: Normal appearance. She is not ill-appearing.  HENT:     Head: Normocephalic and atraumatic.     Right Ear: External ear normal.     Left Ear: External ear normal.  Eyes:     Extraocular Movements: Extraocular movements intact.  Cardiovascular:     Rate and Rhythm: Normal rate.     Pulses: Normal pulses.  Pulmonary:     Effort: Pulmonary effort is normal. No respiratory distress.  Abdominal:     General: There is no distension.     Palpations: Abdomen is soft.  Musculoskeletal:        General: Tenderness present.     Cervical back: Neck supple.     Right lower leg: No edema.     Left lower leg: No edema.     Comments: Patient has good distal strength with no pain over the greater trochanters.  No clonus or focal weakness.  Skin:    Findings: No erythema, lesion or rash.  Neurological:     General: No focal deficit present.     Mental Status: She is alert and oriented to person, place, and time.     Sensory: No sensory deficit.     Motor: No weakness or abnormal muscle tone.     Coordination: Coordination normal.  Psychiatric:        Mood and Affect: Mood normal.        Behavior: Behavior normal.      Imaging: No results found.

## 2021-11-24 DIAGNOSIS — R338 Other retention of urine: Secondary | ICD-10-CM | POA: Diagnosis not present

## 2021-11-24 DIAGNOSIS — R102 Pelvic and perineal pain: Secondary | ICD-10-CM | POA: Diagnosis not present

## 2021-11-24 DIAGNOSIS — R350 Frequency of micturition: Secondary | ICD-10-CM | POA: Diagnosis not present

## 2021-11-25 ENCOUNTER — Ambulatory Visit: Payer: Self-pay

## 2021-11-25 ENCOUNTER — Ambulatory Visit (INDEPENDENT_AMBULATORY_CARE_PROVIDER_SITE_OTHER): Payer: Medicare HMO

## 2021-11-25 ENCOUNTER — Ambulatory Visit (INDEPENDENT_AMBULATORY_CARE_PROVIDER_SITE_OTHER): Payer: Medicare HMO | Admitting: Orthopedic Surgery

## 2021-11-25 DIAGNOSIS — M25561 Pain in right knee: Secondary | ICD-10-CM | POA: Diagnosis not present

## 2021-11-25 DIAGNOSIS — M25562 Pain in left knee: Secondary | ICD-10-CM

## 2021-11-26 ENCOUNTER — Encounter: Payer: Self-pay | Admitting: Orthopedic Surgery

## 2021-11-30 NOTE — Progress Notes (Unsigned)
Assessment/Plan:    1.  Hand pain and paresthesias  -EMG negative  -MRI cervical spine essentially unrevealing  -Discussed with patient that I cannot find a neurologic reason for her hand to feel this way.  Discussed over the telephone that she really needs to follow back up with primary care to see if there is any other issue, whether it be rheumatologic or orthopedic.  There is no neurologic issue that we could find.  2.  Tremor  -As with the last several visits, very little tremor was noted on examination today.  No postural tremor.  No rest tremor.  When she holds a weight, she does have a little bit of tremor that is noted.  Last visit, she did not want to try any medications, but this visit she would like to.  We gave her a prescription for primidone to trial, 50 mg at bedtime.  Risk, benefits, side effects discussed.   Subjective:   Barbara Patton was seen today.  Patient requested work in appointment for tremor.  This has been the case for the last several visits.  Unfortunately, I have not been able to see any significant tremor on her many visits, despite any complaints.  Last visit, we offered her primidone because of consistent complaints, but it was ultimately declined.  She states today that tremor continues to get worse.  It is worse with picking up objects and reaching for objects.  It is mostly worse when she turns pages of a book or tries to do something requiring fine motor coordination.  She thinks that she wants medication for it as tremor bothers her with cooking/measuring.  No falls.  The L hand is a bit numb and she isn't sure if that is coming from lupus.       ALLERGIES:   Allergies  Allergen Reactions   Codeine Other (See Comments)    Kept awake   Penicillins Hives    Did it involve swelling of the face/tongue/throat, SOB, or low BP? N Did it involve sudden or severe rash/hives, skin peeling, or any reaction on the inside of your mouth or nose? Y Did you  need to seek medical attention at a hospital or doctor's office? N When did it last happen?   Over 10 Years Ago    If all above answers are "NO", may proceed with cephalosporin use.     CURRENT MEDICATIONS:  Outpatient Encounter Medications as of 12/01/2021  Medication Sig   acetaminophen (TYLENOL) 325 MG tablet Take 650 mg by mouth 2 (two) times daily as needed (pain).   betamethasone dipropionate (DIPROLENE) 0.05 % ointment Apply 1 application topically 2 (two) times daily as needed (dry skin irritation).    Cholecalciferol (VITAMIN D-3 PO) Take 1 tablet by mouth daily.   diclofenac Sodium (VOLTAREN) 1 % GEL Add'l Sig Add'l Sig topical Add'l Sig   DULoxetine (CYMBALTA) 30 MG capsule    furosemide (LASIX) 40 MG tablet Take 40 mg by mouth daily.   hydrochlorothiazide (HYDRODIURIL) 25 MG tablet Take 25 mg by mouth daily.   hydroxychloroquine (PLAQUENIL) 200 MG tablet Take 200 mg by mouth daily.    levothyroxine (SYNTHROID) 100 MCG tablet Take 100 mcg by mouth daily.   levothyroxine (SYNTHROID, LEVOTHROID) 112 MCG tablet Take 112 mcg by mouth daily.    methocarbamol (ROBAXIN) 500 MG tablet Take 1 tablet (500 mg total) by mouth 2 (two) times daily as needed.   Multiple Vitamins-Minerals (MULTIVITAMIN PO) Take 1 tablet by  mouth daily.    omeprazole (PRILOSEC) 40 MG capsule Take 40 mg by mouth daily.    primidone (MYSOLINE) 50 MG tablet 1/2 po q hs x 1 week, then 1 po q hs   simvastatin (ZOCOR) 20 MG tablet Take 20 mg by mouth every evening.    traMADol (ULTRAM) 50 MG tablet Take 50 mg by mouth 2 (two) times daily as needed (pain).   Coenzyme Q10 (CO Q 10 PO) Take 1 tablet by mouth daily. (Patient not taking: Reported on 12/01/2021)   hydrOXYzine (ATARAX/VISTARIL) 10 MG tablet Take 10 mg by mouth every 6 (six) hours as needed for itching or anxiety.  (Patient not taking: Reported on 12/01/2021)   oxybutynin (DITROPAN) 5 MG tablet Take 5 mg by mouth 3 (three) times daily.  (Patient not taking:  Reported on 12/01/2021)   timolol (TIMOPTIC) 0.5 % ophthalmic solution Place 1 drop into both eyes 2 (two) times daily. (Patient not taking: Reported on 12/01/2021)   tolterodine (DETROL LA) 4 MG 24 hr capsule  (Patient not taking: Reported on 12/01/2021)   [DISCONTINUED] estradiol (ESTRACE) 0.5 MG tablet    No facility-administered encounter medications on file as of 12/01/2021.     Objective:    PHYSICAL EXAMINATION:    VITALS:   Vitals:   12/01/21 1507  BP: (!) 175/74  Pulse: 74  SpO2: 98%  Weight: 191 lb (86.6 kg)  Height: '5\' 6"'$  (1.676 m)      GEN:  The patient appears stated age and is in NAD. HEENT:  Normocephalic, atraumatic.  The mucous membranes are moist. The superficial temporal arteries are without ropiness or tenderness.   Neurological examination:  Orientation: The patient is alert and oriented x3. Cranial nerves: There is good facial symmetry. The speech is fluent and clear. Soft palate rises symmetrically and there is no tongue deviation. Hearing is intact to conversational tone. Sensation: Sensation is intact to light touch throughout Motor: Strength is at least antigravity x4.  Movement examination: Tone: There is normal tone in the bilateral upper extremities.  The tone in the lower extremities is normal.  Abnormal movements: There is no rest tremor, even with distraction procedures.  No postural tremor.  She has no intention tremor.  She has some mild tremor when given a weight (different than last visit).  No trouble with Archimedes spirals. Coordination:  There is no decremation with RAM's, with any form of RAMS, including alternating supination and pronation of the forearm, hand opening and closing, finger taps, heel taps and toe taps  Total time spent on today's visit was 20 minutes, including both face-to-face time and nonface-to-face time.  Time included that spent on review of records (prior notes available to me/labs/imaging if pertinent), discussing  treatment and goals, answering patient's questions and coordinating care.   Cc:  Seward Carol, MD

## 2021-12-01 ENCOUNTER — Ambulatory Visit (INDEPENDENT_AMBULATORY_CARE_PROVIDER_SITE_OTHER): Payer: Medicare HMO | Admitting: Neurology

## 2021-12-01 ENCOUNTER — Encounter: Payer: Self-pay | Admitting: Neurology

## 2021-12-01 VITALS — BP 175/74 | HR 74 | Ht 66.0 in | Wt 191.0 lb

## 2021-12-01 DIAGNOSIS — R251 Tremor, unspecified: Secondary | ICD-10-CM

## 2021-12-01 MED ORDER — PRIMIDONE 50 MG PO TABS: ORAL_TABLET | ORAL | 1 refills | Status: DC

## 2021-12-01 NOTE — Patient Instructions (Addendum)
Start primidone 50 mg - 1/2 tablet at bedtime for 1 week and then increase to 1 tablet at bedtime thereafter.  The physicians and staff at [facility] are committed to providing excellent care. You may receive a survey requesting feedback about your experience at our office. We strive to receive "very good" responses to the survey questions. If you feel that your experience would prevent you from giving the office a "very good " response, please contact our office to try to remedy the situation. We may be reached at [phone]. Thank you for taking the time out of your busy day to complete the survey.  

## 2021-12-03 ENCOUNTER — Emergency Department (HOSPITAL_COMMUNITY): Payer: Medicare HMO

## 2021-12-03 ENCOUNTER — Encounter (HOSPITAL_COMMUNITY): Payer: Self-pay

## 2021-12-03 ENCOUNTER — Other Ambulatory Visit: Payer: Self-pay

## 2021-12-03 ENCOUNTER — Emergency Department (HOSPITAL_COMMUNITY)
Admission: EM | Admit: 2021-12-03 | Discharge: 2021-12-03 | Disposition: A | Discharge status: Home / Self Care | Payer: Medicare HMO | Attending: Emergency Medicine | Admitting: Emergency Medicine

## 2021-12-03 DIAGNOSIS — R000 Tachycardia, unspecified: Secondary | ICD-10-CM | POA: Diagnosis not present

## 2021-12-03 DIAGNOSIS — R251 Tremor, unspecified: Secondary | ICD-10-CM | POA: Insufficient documentation

## 2021-12-03 DIAGNOSIS — G8929 Other chronic pain: Secondary | ICD-10-CM | POA: Insufficient documentation

## 2021-12-03 DIAGNOSIS — M545 Low back pain, unspecified: Secondary | ICD-10-CM | POA: Insufficient documentation

## 2021-12-03 DIAGNOSIS — R4182 Altered mental status, unspecified: Secondary | ICD-10-CM | POA: Diagnosis not present

## 2021-12-03 LAB — COMPREHENSIVE METABOLIC PANEL
ALT: 35 U/L (ref 0–44)
AST: 49 U/L — ABNORMAL HIGH (ref 15–41)
Albumin: 4.2 g/dL (ref 3.5–5.0)
Alkaline Phosphatase: 83 U/L (ref 38–126)
Anion gap: 14 (ref 5–15)
BUN: 30 mg/dL — ABNORMAL HIGH (ref 8–23)
CO2: 24 mmol/L (ref 22–32)
Calcium: 9.5 mg/dL (ref 8.9–10.3)
Chloride: 106 mmol/L (ref 98–111)
Creatinine, Ser: 0.94 mg/dL (ref 0.44–1.00)
GFR, Estimated: >60 mL/min (ref >60)
Glucose, Bld: 123 mg/dL — ABNORMAL HIGH (ref 70–99)
Potassium: 3.7 mmol/L (ref 3.5–5.1)
Sodium: 144 mmol/L (ref 135–145)
Total Bilirubin: 1.4 mg/dL — ABNORMAL HIGH (ref 0.3–1.2)
Total Protein: 8.4 g/dL — ABNORMAL HIGH (ref 6.5–8.1)

## 2021-12-03 LAB — CBC WITH DIFFERENTIAL/PLATELET
Abs Immature Granulocytes: 0.01 10*3/uL (ref 0.00–0.07)
Basophils Absolute: 0 10*3/uL (ref 0.0–0.1)
Basophils Relative: 1 %
Eosinophils Absolute: 0.1 10*3/uL (ref 0.0–0.5)
Eosinophils Relative: 2 %
HCT: 42 % (ref 36.0–46.0)
Hemoglobin: 13.8 g/dL (ref 12.0–15.0)
Immature Granulocytes: 0 %
Lymphocytes Relative: 14 %
Lymphs Abs: 0.5 10*3/uL — ABNORMAL LOW (ref 0.7–4.0)
MCH: 33.7 pg (ref 26.0–34.0)
MCHC: 32.9 g/dL (ref 30.0–36.0)
MCV: 102.7 fL — ABNORMAL HIGH (ref 80.0–100.0)
Monocytes Absolute: 0.1 10*3/uL (ref 0.1–1.0)
Monocytes Relative: 2 %
Neutro Abs: 2.8 10*3/uL (ref 1.7–7.7)
Neutrophils Relative %: 81 %
Platelets: 203 10*3/uL (ref 150–400)
RBC: 4.09 MIL/uL (ref 3.87–5.11)
RDW: 13.9 % (ref 11.5–15.5)
WBC: 3.4 10*3/uL — ABNORMAL LOW (ref 4.0–10.5)
nRBC: 0 % (ref 0.0–0.2)

## 2021-12-03 LAB — CBG MONITORING, ED: Glucose-Capillary: 130 mg/dL — ABNORMAL HIGH (ref 70–99)

## 2021-12-03 MED ORDER — SODIUM CHLORIDE 0.9 % IV SOLN: INTRAVENOUS | Status: DC

## 2021-12-03 NOTE — ED Provider Notes (Signed)
[facility] DEPT Provider Note   CSN: [id number] Arrival date & time: 12/03/21  1543     History {Add pertinent medical, surgical, social history, OB history to HPI:1} Chief Complaint  Patient presents with  . Tremors    Barbara Patton is a 81 y.o. female.  HPI Patient presents by EMS for evaluation of reported tremors.  On arrival, when I saw her, at [facility], she has difficulty giving history.  She is alert, responsive and answers slowly.  She complains of "do not feel well."  She told me that she had to urinate, and felt like she could not wait.  She is currently sitting in a wheelchair in a triage room.  She was unable to offer any additional history at this time.    Home Medications Prior to Admission medications   Medication Sig Start Date End Date Taking? Authorizing Provider  acetaminophen (TYLENOL) 325 MG tablet Take 650 mg by mouth 2 (two) times daily as needed (pain).    [provider]  betamethasone dipropionate (DIPROLENE) 0.05 % ointment Apply 1 application topically 2 (two) times daily as needed (dry skin irritation).  08/21/19   [provider]  Cholecalciferol (VITAMIN D-3 PO) Take 1 tablet by mouth daily.    [provider]  Coenzyme Q10 (CO Q 10 PO) Take 1 tablet by mouth daily. Patient not taking: Reported on 12/01/2021    [provider]  diclofenac Sodium (VOLTAREN) 1 % GEL Add'l Sig Add'l Sig topical Add'l Sig 08/21/19   [provider]  DULoxetine (CYMBALTA) 30 MG capsule  11/03/20   [provider]  furosemide (LASIX) 40 MG tablet Take 40 mg by mouth daily. 12/22/19   [provider]  hydrochlorothiazide (HYDRODIURIL) 25 MG tablet Take 25 mg by mouth daily. 01/06/14   [provider]  hydroxychloroquine (PLAQUENIL) 200 MG tablet Take 200 mg by mouth daily.  06/19/16   [provider]  hydrOXYzine (ATARAX/VISTARIL) 10 MG tablet Take 10 mg by mouth every 6  (six) hours as needed for itching or anxiety.  Patient not taking: Reported on 12/01/2021 06/10/17   [provider]  levothyroxine (SYNTHROID) 100 MCG tablet Take 100 mcg by mouth daily. 11/11/19   [provider]  levothyroxine (SYNTHROID, LEVOTHROID) 112 MCG tablet Take 112 mcg by mouth daily.  01/24/14   [provider]  methocarbamol (ROBAXIN) 500 MG tablet Take 1 tablet (500 mg total) by mouth 2 (two) times daily as needed. 10/20/20   Magnant, Gerrianne Scale, PA-C  Multiple Vitamins-Minerals (MULTIVITAMIN PO) Take 1 tablet by mouth daily.     [provider]  omeprazole (PRILOSEC) 40 MG capsule Take 40 mg by mouth daily.  03/09/16   [provider]  oxybutynin (DITROPAN) 5 MG tablet Take 5 mg by mouth 3 (three) times daily.  Patient not taking: Reported on 12/01/2021 12/07/19   [provider]  primidone (MYSOLINE) 50 MG tablet 1/2 po q hs x 1 week, then 1 po q hs 12/01/21   Tat, Eustace Quail, DO  simvastatin (ZOCOR) 20 MG tablet Take 20 mg by mouth every evening.  11/06/13   [provider]  timolol (TIMOPTIC) 0.5 % ophthalmic solution Place 1 drop into both eyes 2 (two) times daily. Patient not taking: Reported on 12/01/2021 08/21/19   [provider]  tolterodine (DETROL LA) 4 MG 24 hr capsule  01/28/18   [provider]  traMADol (ULTRAM) 50 MG tablet Take 50 mg  by mouth 2 (two) times daily as needed (pain). 01/01/14   [provider]  estradiol (ESTRACE) 0.5 MG tablet  02/14/18 11/09/19  [provider]      Allergies    Codeine and Penicillins    Review of Systems   Review of Systems  Physical Exam Updated Vital Signs BP (!) 198/100 (BP Location: Right Arm)   Pulse (!) 101   Temp 99.4 F (37.4 C) (Oral)   Resp 18   SpO2 96%  Physical Exam Vitals and nursing note reviewed.  Constitutional:      General: She is not in acute distress.    Appearance: She is well-developed. She is not ill-appearing,  toxic-appearing or diaphoretic.  HENT:     Head: Normocephalic and atraumatic.     Nose: No congestion or rhinorrhea.     Mouth/Throat:     Pharynx: No oropharyngeal exudate.  Eyes:     Conjunctiva/sclera: Conjunctivae normal.     Pupils: Pupils are equal, round, and reactive to light.  Neck:     Trachea: Phonation normal.  Cardiovascular:     Rate and Rhythm: Normal rate and regular rhythm.     Comments: Blood pressure is very high. Pulmonary:     Effort: Pulmonary effort is normal.     Breath sounds: Normal breath sounds.  Chest:     Chest wall: No tenderness.  Abdominal:     General: There is no distension.     Palpations: Abdomen is soft.     Tenderness: There is no abdominal tenderness. There is no guarding.     Comments: She has emesis on her shirt which is the color of gastric contents.  Musculoskeletal:        General: No swelling or tenderness. Normal range of motion.     Cervical back: Normal range of motion and neck supple.  Skin:    General: Skin is warm and dry.  Neurological:     Mental Status: She is alert and oriented to person, place, and time.     Motor: No abnormal muscle tone.     Comments: She speaks slowly but is responsive.  There is no dysarthria or aphasia.  Psychiatric:        Attention and Perception: She is inattentive.        Mood and Affect: Mood normal.        Speech: Speech normal.        Behavior: Behavior is cooperative.        Thought Content: Thought content normal.        Cognition and Memory: She exhibits impaired recent memory and impaired remote memory.        Judgment: Judgment is not inappropriate.     Comments: She is a poor historian     ED Results / Procedures / Treatments   Labs (all labs ordered are listed, but only abnormal results are displayed) Labs Reviewed - No data to display  EKG None  Radiology No results found.  Procedures Procedures  {Document cardiac monitor, telemetry assessment procedure when  appropriate:1}  Medications Ordered in ED Medications - No data to display  ED Course/ Medical Decision Making/ A&P Clinical Course as of 12/03/21 1838  Sat Dec 03, 2021  1809 At this time the patient is much more comfortable, is alert and conversant, and in no distress.  She reports that she accidentally moved her bowels, while she was urinating, lying on the bed.  She has never had this  happen previously.  Note that she has a pure work on and has been encouraged to avoid at will.  She reports that she has "back pain all the time."  She is using various medications both over-the-counter and narcotic for that.  Currently she has no other complaints and feels like she is ready to go home. [EW]    Clinical Course User Index [EW] Daleen Bo, MD                           Medical Decision Making Patient presenting by EMS with very high blood pressure.  Apparently she was transferred for "tremors.  When she saw her neurologist, 2 days ago, her blood pressure was 175/74.  At that time she is being evaluated for persistent tremor, without a clear etiology.  Today she is altered, confused, but not dysarthric or showing signs of aphasia.  I am concerned about hypertensive urgency.  She does not take anticoagulants.  She requires urgent evaluation for intracranial bleeding, metabolic disorders, infectious processes and dynamic disorders  Amount and/or Complexity of Data Reviewed Labs: ordered. Radiology: ordered.  Risk Prescription drug management.   ***  {Document critical care time when appropriate:1} {Document review of labs and clinical decision tools ie heart score, Chads2Vasc2 etc:1}  {Document your independent review of radiology images, and any outside records:1} {Document your discussion with family members, caretakers, and with consultants:1} {Document social determinants of health affecting pt's care:1} {Document your decision making why or why not admission, treatments were  needed:1} Final Clinical Impression(s) / ED Diagnoses Final diagnoses:  None    Rx / DC Orders ED Discharge Orders     None

## 2021-12-03 NOTE — ED Triage Notes (Signed)
Per EMS from home, hx of tremors. Went to PCP on Thursday for tremors and was given medication for them but did not pick up the medication. Pt states today they are worst and stated to EMS "I want them to stop." Hx Lupus   VS with EMS 180/80 BP 90 HR 94% oxygen  16 RR 95 CBG

## 2021-12-03 NOTE — Discharge Instructions (Signed)
Findings today are reassuring.  There is no sign of stroke, heart attack or any blood disorders.  Continue taking your usual medications, get plenty of rest and eat 3 meals a day.  Return here or see your doctor as needed for problems.

## 2021-12-05 ENCOUNTER — Telehealth: Payer: Self-pay | Admitting: Neurology

## 2021-12-05 NOTE — Telephone Encounter (Signed)
Pt called in stating she had another episode on Saturday. Her whole body was shaking and she had to go to the ED.

## 2021-12-05 NOTE — Telephone Encounter (Signed)
Called patient and she stated on Saturday 12/03/21 she was sitting in her recliner and when she sat up a little to stretch her whole body started shaking from her mouth all the way down to her feet. Patient could barely talk and called EMS.  Patient states the shaking lasted for "quite awhile" and started to go away after the ER doctor ran some test on her. Patient stated the ER doctor didn't tell her nothing about the tremors and "they were talking about other stuff."  Today patient states she is "not in great shape". Her stomach is "kinda sore." Informed patient I would send this message to Dr. Carles Collet and call her back. Patient verbalized understanding and had no further questions or concerns.

## 2021-12-05 NOTE — Telephone Encounter (Signed)
Patient called back and is scheduled for July 5.

## 2021-12-05 NOTE — Telephone Encounter (Signed)
Called and spoke with patient and informed her that Dr. Carles Collet has Reviewed ER records and she also just saw pt on Thursday (today is Monday).  Pt arrived with BP 198/100 and confused with probable emesis on shirt.  ER concerned that she had hypertensive emergency (means that BP went up so high she had some type of intracranial event or seizure).    If this is the case, the treatment is to treat the BP issue.  We can have Barbara Patton see her and make sure nothing else going on and she can order eeg if indicated.   Patient informed me that she does want to see Barbara Patton. Patient verbalized understanding of what was explained to her and is aware that I will have someone from the front call her to get her scheduled with Barbara Patton.

## 2021-12-05 NOTE — Telephone Encounter (Signed)
Patient will call back to schedule I offered her the 12-07-21 at 3:30 with Clarise Cruz. But she said that she would need to call back

## 2021-12-07 ENCOUNTER — Ambulatory Visit: Payer: No Typology Code available for payment source | Admitting: Physician Assistant

## 2021-12-08 ENCOUNTER — Ambulatory Visit (INDEPENDENT_AMBULATORY_CARE_PROVIDER_SITE_OTHER): Payer: Medicare HMO | Admitting: Physician Assistant

## 2021-12-08 ENCOUNTER — Encounter: Payer: Self-pay | Admitting: Physician Assistant

## 2021-12-08 VITALS — BP 142/72 | HR 102 | Resp 20 | Ht 66.5 in | Wt 187.0 lb

## 2021-12-08 DIAGNOSIS — R202 Paresthesia of skin: Secondary | ICD-10-CM | POA: Diagnosis not present

## 2021-12-08 DIAGNOSIS — R251 Tremor, unspecified: Secondary | ICD-10-CM | POA: Diagnosis not present

## 2021-12-08 NOTE — Progress Notes (Signed)
Assessment/Plan:    1. Hand pain and paresthesia EMG negative MRI C spine unrevealing Discussed with patient that no neurological reason for her symptoms, recommend PCP follow up, wether is rheumatological or orthopedic.    2.  Tremor  -Currently no tremors are noted on exam. She is on primidone 50 mg (started today at half tablet this morning with good results) no postural or resting tremors . RBD    Subjective:   Barbara Patton was seen today.  She was seen on 7/1 at the ED with tremors , but her history was limited and unclear. She is accompanied by her daughter who is a poor historian. BP was very high at the time but no stroke symptoms were noted and workup was negative including a head CT.  She had seen at the movement disorders clinic on 6/29 and after several visits with the same complaints -and no tremors on exam in neither of them-, she was offered primidone for control of her symptoms. She has began taking the medicine this morning, and reports that is responding well without any tremors.  She states that when present, it is worse with picking up objects, or doing fine motor activities. She has not tried cooking while on the medication yet. Denies any falls. The L hand has chronic mild numbness that may be related to lupus.        ALLERGIES:   Allergies  Allergen Reactions   Codeine Other (See Comments)    Kept awake   Penicillin G Other (See Comments)   Penicillins Hives    Did it involve swelling of the face/tongue/throat, SOB, or low BP? N Did it involve sudden or severe rash/hives, skin peeling, or any reaction on the inside of your mouth or nose? Y Did you need to seek medical attention at a hospital or doctor's office? N When did it last happen?   Over 10 Years Ago    If all above answers are "NO", may proceed with cephalosporin use.     CURRENT MEDICATIONS:  Outpatient Encounter Medications as of 12/08/2021  Medication Sig   acetaminophen (TYLENOL) 325 MG  tablet Take 650 mg by mouth 2 (two) times daily as needed (pain).   betamethasone dipropionate (DIPROLENE) 0.05 % ointment Apply 1 application topically 2 (two) times daily as needed (dry skin irritation).    Cholecalciferol (VITAMIN D-3 PO) Take 1 tablet by mouth daily.   diclofenac Sodium (VOLTAREN) 1 % GEL Add'l Sig Add'l Sig topical Add'l Sig   DULoxetine (CYMBALTA) 30 MG capsule    furosemide (LASIX) 40 MG tablet Take 40 mg by mouth daily.   hydrochlorothiazide (HYDRODIURIL) 25 MG tablet Take 25 mg by mouth daily.   hydroxychloroquine (PLAQUENIL) 200 MG tablet Take 200 mg by mouth daily.    hydrOXYzine (ATARAX/VISTARIL) 10 MG tablet Take 10 mg by mouth every 6 (six) hours as needed for itching or anxiety.   levothyroxine (SYNTHROID) 100 MCG tablet Take 100 mcg by mouth daily.   levothyroxine (SYNTHROID, LEVOTHROID) 112 MCG tablet Take 112 mcg by mouth daily.    methocarbamol (ROBAXIN) 500 MG tablet Take 1 tablet (500 mg total) by mouth 2 (two) times daily as needed.   Multiple Vitamins-Minerals (MULTIVITAMIN PO) Take 1 tablet by mouth daily.    omeprazole (PRILOSEC) 40 MG capsule Take 40 mg by mouth daily.    oxybutynin (DITROPAN) 5 MG tablet Take 5 mg by mouth 3 (three) times daily.   primidone (MYSOLINE) 50 MG tablet 1/2  po q hs x 1 week, then 1 po q hs   simvastatin (ZOCOR) 20 MG tablet Take 20 mg by mouth every evening.    timolol (TIMOPTIC) 0.5 % ophthalmic solution Place 1 drop into both eyes 2 (two) times daily.   tolterodine (DETROL LA) 4 MG 24 hr capsule    traMADol (ULTRAM) 50 MG tablet Take 50 mg by mouth 2 (two) times daily as needed (pain).   Coenzyme Q10 (CO Q 10 PO) Take 1 tablet by mouth daily. (Patient not taking: Reported on 12/08/2021)   [DISCONTINUED] estradiol (ESTRACE) 0.5 MG tablet    No facility-administered encounter medications on file as of 12/08/2021.     Objective:    PHYSICAL EXAMINATION:    VITALS:   Vitals:   12/08/21 1121  BP: (!) 142/72  Pulse:  (!) 102  Resp: 20  SpO2: 95%  Weight: 187 lb (84.8 kg)  Height: 5' 6.5" (1.689 m)    GEN:  The patient appears stated age and is in NAD. HEENT:  Normocephalic, atraumatic.  The mucous membranes are moist. The superficial temporal arteries are without ropiness or tenderness.   Neurological examination:  Orientation: The patient is alert and oriented x3. Cranial nerves: There is good facial symmetry. The speech is fluent and clear. Soft palate rises symmetrically and there is no tongue deviation. Hearing is intact to conversational tone. Sensation: Sensation is intact to light touch throughout Motor: Strength is at least antigravity x4.  Movement examination: Tone: There is normal tone in the bilateral upper extremities.  The tone in the lower extremities is normal.  Abnormal movements: No rest tremor, even with distraction procedures.  No postural tremor.  She has no intention tremor.  She has some mild tremor when given a weight (different than last visit).  No trouble with Archimedes spirals. Coordination:  There is no decremation with RAM's, with any form of RAMS, including alternating supination and pronation of the forearm, hand opening and closing, finger taps, heel taps and toe taps  Total time spent on today's visit was 20 minutes, including both face-to-face time and nonface-to-face time.  Time included that spent on review of records (prior notes available to me/labs/imaging if pertinent), discussing treatment and goals, answering patient's questions and coordinating care.   Cc:  Seward Carol, MD

## 2021-12-08 NOTE — Patient Instructions (Addendum)
Continue primidone 50 mg - 1/2 tablet at bedtime for 1 week and then increase to 1 tablet at bedtime thereafter.  Monitor your high blood pressure and follow with primary doctors  Follow up as scheduled with Dr. Carles Collet  The physicians and staff at [facility] are committed to providing excellent care. You may receive a survey requesting feedback about your experience at our office. We strive to receive "very good" responses to the survey questions. If you feel that your experience would prevent you from giving the office a "very good " response, please contact our office to try to remedy the situation. We may be reached at [phone]. Thank you for taking the time out of your busy day to complete the survey.

## 2021-12-14 DIAGNOSIS — H401132 Primary open-angle glaucoma, bilateral, moderate stage: Secondary | ICD-10-CM | POA: Diagnosis not present

## 2021-12-14 DIAGNOSIS — H35373 Puckering of macula, bilateral: Secondary | ICD-10-CM | POA: Diagnosis not present

## 2021-12-14 DIAGNOSIS — H3500 Unspecified background retinopathy: Secondary | ICD-10-CM | POA: Diagnosis not present

## 2021-12-14 DIAGNOSIS — Z961 Presence of intraocular lens: Secondary | ICD-10-CM | POA: Diagnosis not present

## 2021-12-19 DIAGNOSIS — H401133 Primary open-angle glaucoma, bilateral, severe stage: Secondary | ICD-10-CM | POA: Diagnosis not present

## 2021-12-21 DIAGNOSIS — R338 Other retention of urine: Secondary | ICD-10-CM | POA: Diagnosis not present

## 2021-12-21 DIAGNOSIS — R350 Frequency of micturition: Secondary | ICD-10-CM | POA: Diagnosis not present

## 2021-12-27 DIAGNOSIS — H903 Sensorineural hearing loss, bilateral: Secondary | ICD-10-CM | POA: Diagnosis not present

## 2022-01-04 DIAGNOSIS — R609 Edema, unspecified: Secondary | ICD-10-CM | POA: Diagnosis not present

## 2022-01-04 DIAGNOSIS — M2550 Pain in unspecified joint: Secondary | ICD-10-CM | POA: Diagnosis not present

## 2022-01-05 ENCOUNTER — Ambulatory Visit: Payer: No Typology Code available for payment source | Admitting: Neurology

## 2022-01-11 DIAGNOSIS — M2550 Pain in unspecified joint: Secondary | ICD-10-CM | POA: Diagnosis not present

## 2022-01-26 DIAGNOSIS — H35033 Hypertensive retinopathy, bilateral: Secondary | ICD-10-CM | POA: Diagnosis not present

## 2022-01-26 DIAGNOSIS — H43813 Vitreous degeneration, bilateral: Secondary | ICD-10-CM | POA: Diagnosis not present

## 2022-01-26 DIAGNOSIS — H348122 Central retinal vein occlusion, left eye, stable: Secondary | ICD-10-CM | POA: Diagnosis not present

## 2022-01-26 DIAGNOSIS — H35373 Puckering of macula, bilateral: Secondary | ICD-10-CM | POA: Diagnosis not present

## 2022-01-30 ENCOUNTER — Telehealth: Payer: Self-pay | Admitting: Physician Assistant

## 2022-01-30 NOTE — Telephone Encounter (Signed)
Patient called and left a voice mail requesting a call back. She said the medication prescribed is not helping her, she'd like an alternative.

## 2022-01-30 NOTE — Telephone Encounter (Signed)
Continue primidone 50 mg - 1/2 tablet at bedtime for 1 week and then increase to 1 tablet at bedtime thereafter, please advise,

## 2022-02-01 DIAGNOSIS — H401132 Primary open-angle glaucoma, bilateral, moderate stage: Secondary | ICD-10-CM | POA: Diagnosis not present

## 2022-02-01 DIAGNOSIS — H5202 Hypermetropia, left eye: Secondary | ICD-10-CM | POA: Diagnosis not present

## 2022-02-17 ENCOUNTER — Ambulatory Visit: Payer: Self-pay

## 2022-02-17 ENCOUNTER — Ambulatory Visit (INDEPENDENT_AMBULATORY_CARE_PROVIDER_SITE_OTHER): Payer: Medicare HMO

## 2022-02-17 ENCOUNTER — Ambulatory Visit (INDEPENDENT_AMBULATORY_CARE_PROVIDER_SITE_OTHER): Payer: Medicare HMO | Admitting: Surgical

## 2022-02-17 DIAGNOSIS — M25561 Pain in right knee: Secondary | ICD-10-CM

## 2022-02-17 DIAGNOSIS — M25552 Pain in left hip: Secondary | ICD-10-CM

## 2022-02-17 DIAGNOSIS — M25562 Pain in left knee: Secondary | ICD-10-CM

## 2022-02-17 DIAGNOSIS — M7061 Trochanteric bursitis, right hip: Secondary | ICD-10-CM | POA: Diagnosis not present

## 2022-02-19 ENCOUNTER — Encounter: Payer: Self-pay | Admitting: Surgical

## 2022-02-19 MED ORDER — METHYLPREDNISOLONE ACETATE 40 MG/ML IJ SUSP
40.0000 mg | INTRAMUSCULAR | Status: AC | PRN
  Administered 2022-02-17: 40 mg via INTRA_ARTICULAR

## 2022-02-19 MED ORDER — BUPIVACAINE HCL 0.25 % IJ SOLN
4.0000 mL | INTRAMUSCULAR | Status: AC | PRN
  Administered 2022-02-17: 4 mL via INTRA_ARTICULAR

## 2022-02-19 NOTE — Progress Notes (Signed)
Office Visit Note   Patient: Barbara Patton           Date of Birth: [birth year]           MRN: [id number] Visit Date: 02/17/2022 Requested by: Seward Carol, MD [address] [place],  [place] [address] PCP: Seward Carol, MD  Subjective: Chief Complaint  Patient presents with   Left Knee - Pain    HPI: Barbara WOMAC is a 81 y.o. female who presents to the office complaining of bilateral knee and bilateral hip pain.  Patient has history of bilateral total knee arthroplasty and reports constant pain in both knees.  She was recently seen by Dr. Marlou Sa in late June 2023 for similar complaint.  No new injury since that event.  She denies any increased swelling since then.  No fevers or chills.  In addition to the knee pain, she has bilateral hip pain that she localizes to the lateral aspect of both hips primarily.  She has had prior trochanteric injection by Dr. Ernestina Patches in May that gave her good relief of her lateral hip pain on the left side.  No increased low back pain.  No radicular pain or numbness/tingling.  Does have history of lupus and fibromyalgia..                ROS: All systems reviewed are negative as they relate to the chief complaint within the history of present illness.  Patient denies fevers or chills.  Assessment & Plan: Visit Diagnoses:  1. Greater trochanteric pain syndrome of left lower extremity   2. Pain in both knees, unspecified chronicity   3. Greater trochanteric bursitis, right     Plan: Patient is an 81 year old female who presents for evaluation of bilateral knee and hip pain.  She has history of bilateral knee replacements with repeat radiographs taken today demonstrating no structural abnormality pertaining to the arthroplasty components.  She has diffuse pain with tenderness over the medial proximal tibia and pes anserine bursa bilaterally.  Reassured her that does not appear to be any structural abnormality in regards to the knees with no  change since recent radiographs.  No effusion or sign of infection.  After discussion, she would like to focus on her hip pain and she has had prior trochanteric injection in the left hip by Dr. Ernestina Patches about 4 months ago that gave her good relief.  She would like to repeat this today and try the same injection on the right-hand side.  Under ultrasound guidance, the posterior facet of both greater trochanters was identified and injection successfully delivered into the trochanteric bursa on both the left and right hips.  There was no significant resistance during administration of the injection, indicating tendonous attachment was avoided.Marland Kitchen  She will give this time to take effect and reach out to the office if she has continued symptoms.  Follow-Up Instructions: No follow-ups on file.   Orders:  Orders Placed This Encounter  Procedures   XR KNEE 3 VIEW LEFT   US Guided Needle Placement - No Linked Charges   No orders of the defined types were placed in this encounter.     Procedures: Large Joint Inj: bilateral greater trochanter on 02/17/2022 11:12 AM Indications: pain and diagnostic evaluation Details: 18 G 3.5 in needle, ultrasound-guided lateral approach  Arthrogram: No  Medications (Right): 4 mL bupivacaine 0.25 %; 40 mg methylPREDNISolone acetate 40 MG/ML Medications (Left): 4 mL bupivacaine 0.25 %; 40 mg methylPREDNISolone acetate 40  MG/ML Outcome: tolerated well, no immediate complications Procedure, treatment alternatives, risks and benefits explained, specific risks discussed. Consent was given by the patient. Immediately prior to procedure a time out was called to verify the correct patient, procedure, equipment, support staff and site/side marked as required. Patient was prepped and draped in the usual sterile fashion.       Clinical Data: No additional findings.  Objective: Vital Signs: There were no vitals taken for this visit.  Physical Exam:  Constitutional: Patient  appears well-developed HEENT:  Head: Normocephalic Eyes:EOM are normal Neck: Normal range of motion Cardiovascular: Normal rate Pulmonary/chest: Effort normal Neurologic: Patient is alert Skin: Skin is warm Psychiatric: Patient has normal mood and affect  Ortho Exam: Ortho exam demonstrates left and right knees with no effusion.  Well-healed incision from prior total knee arthroplasty in both knees without any sinus tract noted.  No calf tenderness.  Negative Homans' sign.  She has 0 degrees extension and 100 degrees of knee flexion bilaterally.  No significant pain with hip range of motion bilaterally but she does have moderate to severe tenderness over both trochanteric regions.  Negative straight leg raise bilaterally.  She is able to perform straight leg raise with both legs.  Moderate tenderness over the medial proximal tibia as well as the pes anserine bursa bilaterally.  Specialty Comments:  No specialty comments available.  Imaging: No results found.   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   Allergic rhinitis 07/26/2015   Anxiety 07/26/2015   Benign essential hypertension 07/26/2015   Dysfunction of left eustachian tube 07/26/2015   Esophageal reflux 07/26/2015   Fibromyalgia 07/26/2015   Pure hypercholesterolemia 07/26/2015   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 05/03/2014   Pes anserinus bursitis of left knee 12/04/2013    Trochanteric bursitis of left hip 08/07/2013   Leg swelling 12/06/2011   Glaucoma 11/02/2011   HLD (hyperlipidemia) 11/02/2011   HTN (hypertension) 11/02/2011   Hypothyroid 11/02/2011   OA (osteoarthritis) 11/02/2011   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 ([place]) 03/16/2011   Presence of artificial knee joint 03/16/2011   Past Medical History:  Diagnosis Date   Glaucoma    High cholesterol    Hypertension    Lupus ([place])    Thyroid disease     Family History  Problem Relation Age of Onset   Cancer Father    Cancer Brother    Cancer Brother    Stroke Brother     Past Surgical History:  Procedure Laterality Date   ABDOMINAL HYSTERECTOMY     CATARACT EXTRACTION, BILATERAL     TOTAL KNEE ARTHROPLASTY Bilateral    TUBAL LIGATION     Social History   Occupational History   Not on file  Tobacco Use   Smoking status: Former    Types: Cigarettes    Quit date: 1997    Years since quitting: 26.7   Smokeless tobacco: Never  Vaping Use   Vaping Use: Never used  Substance and Sexual Activity   Alcohol use: Yes    Alcohol/week: 3.0 standard drinks of alcohol    Types: 3 Glasses of wine per week    Comment: occ wine   Drug use: No   Sexual activity: Not on file

## 2022-02-27 ENCOUNTER — Telehealth: Payer: Self-pay | Admitting: Surgical

## 2022-02-27 NOTE — Telephone Encounter (Signed)
Patient wants to let Barbara Patton know that shots are doing good but now she has pain. [phone]

## 2022-02-28 ENCOUNTER — Other Ambulatory Visit: Payer: Self-pay

## 2022-02-28 ENCOUNTER — Telehealth: Payer: Self-pay | Admitting: Neurology

## 2022-02-28 DIAGNOSIS — R251 Tremor, unspecified: Secondary | ICD-10-CM

## 2022-02-28 MED ORDER — PRIMIDONE 50 MG PO TABS: ORAL_TABLET | ORAL | 10 refills | Status: DC

## 2022-02-28 NOTE — Telephone Encounter (Signed)
Patient left message on VM and wants to know how she is to take her medication ,   1 in the morning  1 at night  2 in the morning or 2 at night  Does she take them together or at different times   She did not leave the name of the medication

## 2022-02-28 NOTE — Telephone Encounter (Signed)
Called patient and she agreed to go up to 50 mg BID. I have sent prescription in for this patient

## 2022-02-28 NOTE — Telephone Encounter (Signed)
Patient said the primidone '50mg'$  tat put her on has not helped her at all. She would like to know if she needs to be put on something else? Does tat want to see her again?

## 2022-03-01 NOTE — Telephone Encounter (Signed)
Called and reminded Pt of how much to take of her medicine. She understood.

## 2022-03-08 DIAGNOSIS — E559 Vitamin D deficiency, unspecified: Secondary | ICD-10-CM | POA: Diagnosis not present

## 2022-03-08 DIAGNOSIS — M5136 Other intervertebral disc degeneration, lumbar region: Secondary | ICD-10-CM | POA: Diagnosis not present

## 2022-03-08 DIAGNOSIS — Z6830 Body mass index (BMI) 30.0-30.9, adult: Secondary | ICD-10-CM | POA: Diagnosis not present

## 2022-03-08 DIAGNOSIS — M797 Fibromyalgia: Secondary | ICD-10-CM | POA: Diagnosis not present

## 2022-03-08 DIAGNOSIS — R5383 Other fatigue: Secondary | ICD-10-CM | POA: Diagnosis not present

## 2022-03-08 DIAGNOSIS — M329 Systemic lupus erythematosus, unspecified: Secondary | ICD-10-CM | POA: Diagnosis not present

## 2022-03-08 DIAGNOSIS — M1991 Primary osteoarthritis, unspecified site: Secondary | ICD-10-CM | POA: Diagnosis not present

## 2022-03-08 DIAGNOSIS — R21 Rash and other nonspecific skin eruption: Secondary | ICD-10-CM | POA: Diagnosis not present

## 2022-03-08 DIAGNOSIS — H2011 Chronic iridocyclitis, right eye: Secondary | ICD-10-CM | POA: Diagnosis not present

## 2022-03-08 NOTE — Telephone Encounter (Signed)
IC patient stated she will think about it and call me back if she decides to go with PT

## 2022-03-08 NOTE — Telephone Encounter (Signed)
Glad the injections helped. If pain is still bothering her a lot, then she can either see me/Dr Marlou Sa for re-eval or potentially she could try PT for gluteal strengthening exercises/ IT band stretching.

## 2022-03-14 ENCOUNTER — Telehealth: Payer: Self-pay

## 2022-03-14 NOTE — Telephone Encounter (Signed)
Patient called stating that someone had called her concerning PT, but wasn't clear about it.  Would like a CB to discuss?  Cb# [phone].  Please advise.  Thank you.

## 2022-03-15 NOTE — Telephone Encounter (Signed)
I tried calling to discuss with patient. No answer. LMVM for her to return call. Additional information needed.

## 2022-03-17 ENCOUNTER — Telehealth: Payer: Self-pay | Admitting: Orthopedic Surgery

## 2022-03-17 NOTE — Telephone Encounter (Signed)
Pt called requesting a call back about what else she can do about right hip pains. Please call pt at [phone].

## 2022-03-27 NOTE — Telephone Encounter (Signed)
I called lmom

## 2022-03-28 DIAGNOSIS — Z1231 Encounter for screening mammogram for malignant neoplasm of breast: Secondary | ICD-10-CM | POA: Diagnosis not present

## 2022-03-31 ENCOUNTER — Ambulatory Visit: Payer: Self-pay

## 2022-03-31 ENCOUNTER — Ambulatory Visit (INDEPENDENT_AMBULATORY_CARE_PROVIDER_SITE_OTHER): Payer: Medicare HMO | Admitting: Orthopedic Surgery

## 2022-03-31 ENCOUNTER — Encounter: Payer: Self-pay | Admitting: Orthopedic Surgery

## 2022-03-31 DIAGNOSIS — M1612 Unilateral primary osteoarthritis, left hip: Secondary | ICD-10-CM | POA: Diagnosis not present

## 2022-03-31 MED ORDER — LIDOCAINE HCL 1 % IJ SOLN
5.0000 mL | INTRAMUSCULAR | Status: AC | PRN
  Administered 2022-03-31: 5 mL

## 2022-03-31 MED ORDER — METHYLPREDNISOLONE ACETATE 80 MG/ML IJ SUSP
80.0000 mg | INTRAMUSCULAR | Status: AC | PRN
  Administered 2022-03-31: 80 mg via INTRA_ARTICULAR

## 2022-03-31 MED ORDER — BUPIVACAINE HCL 0.25 % IJ SOLN
4.0000 mL | INTRAMUSCULAR | Status: AC | PRN
  Administered 2022-03-31: 4 mL via INTRA_ARTICULAR

## 2022-03-31 NOTE — Progress Notes (Signed)
Office Visit Note   Patient: Barbara Patton           Date of Birth: [birth year]           MRN: [id number] Visit Date: 03/31/2022 Requested by: Seward Carol, MD [address] [place],  [place] [address] PCP: Seward Carol, MD  Subjective: Chief Complaint  Patient presents with   Left Leg - Pain   Left Hip - Pain    HPI: Barbara Patton is a 81 y.o. female who presents to the office reporting multiple orthopedic complaints.  Patient describes left hip and groin pain as well as buttock trochanteric and radicular pain going down the leg.  Having little bit more groin pain than normal.  Does have prior hip radiographs of this year which does show mild to moderate arthritis.  Has had trochanteric injection 02/17/2022 which gave her relief which was marginal for only 3 days.  Describes using CBD cream on the left hip.  Her walking pace has slowed.  She is using a cane..                ROS: All systems reviewed are negative as they relate to the chief complaint within the history of present illness.  Patient denies fevers or chills.  Assessment & Plan: Visit Diagnoses:  1. Arthritis of left hip   2. Unilateral primary osteoarthritis, left hip     Plan: Impression is left hip arthritis as well as back arthritis.  She is adamant about having any more surgery.  I think her hip arthritis is becoming a little bit more severe.  Best course today would be for diagnostic and therapeutic left hip intra-articular injection.  This performed today under ultrasound guidance.  We will see how she does with that injection over the next several days particularly the anesthetic phase of the injection.  If substantial relief is achieved and I think she could predictably have relief with hip replacement if indicated.  She is very much opposed to any type of back surgery.  Follow-up with [place] as needed.  Follow-Up Instructions: No follow-ups on file.   Orders:  Orders Placed This Encounter   Procedures   US Guided Needle Placement - No Linked Charges   No orders of the defined types were placed in this encounter.     Procedures: Large Joint Inj: L hip joint on 03/31/2022 11:56 PM Indications: pain and diagnostic evaluation Details: 18 G 3.5 in needle, ultrasound-guided lateral approach  Arthrogram: No  Medications: 5 mL lidocaine 1 %; 80 mg methylPREDNISolone acetate 80 MG/ML; 4 mL bupivacaine 0.25 % Outcome: tolerated well, no immediate complications Procedure, treatment alternatives, risks and benefits explained, specific risks discussed. Consent was given by the patient. Immediately prior to procedure a time out was called to verify the correct patient, procedure, equipment, support staff and site/side marked as required. Patient was prepped and draped in the usual sterile fashion.       Clinical Data: No additional findings.  Objective: Vital Signs: There were no vitals taken for this visit.  Physical Exam:  Constitutional: Patient appears well-developed HEENT:  Head: Normocephalic Eyes:EOM are normal Neck: Normal range of motion Cardiovascular: Normal rate Pulmonary/chest: Effort normal Neurologic: Patient is alert Skin: Skin is warm Psychiatric: Patient has normal mood and affect  Ortho Exam: Ortho exam demonstrates slight Trendelenburg gait to the left.  Leg length equal.  Dorsiflexion plantarflexion strength both feet intact with no nerve root tension signs.  Does  have limitation of internal rotation on the left by about 10 degrees compared to the right.  Does have some groin pain with internal and external rotation.  Has excellent hip flexion strength.  Knee range of motion unchanged.  No other masses lymphadenopathy or skin changes noted in that left knee or hip region.  Specialty Comments:  No specialty comments available.  Imaging: No results found.   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   Allergic rhinitis 07/26/2015   Anxiety 07/26/2015   Benign essential hypertension 07/26/2015   Dysfunction of left eustachian tube 07/26/2015   Esophageal reflux 07/26/2015   Fibromyalgia 07/26/2015   Pure hypercholesterolemia 07/26/2015   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 05/03/2014   Pes anserinus bursitis of left knee 12/04/2013   Trochanteric bursitis of left hip 08/07/2013   Leg swelling 12/06/2011   Glaucoma 11/02/2011   HLD (hyperlipidemia) 11/02/2011   HTN (hypertension) 11/02/2011   Hypothyroid 11/02/2011   OA (osteoarthritis) 11/02/2011   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 ([place]) 03/16/2011   Presence of artificial knee joint 03/16/2011   Past Medical History:  Diagnosis Date   Glaucoma    High cholesterol    Hypertension    Lupus ([place])    Thyroid disease     Family History  Problem Relation Age of Onset   Cancer Father    Cancer Brother    Cancer Brother    Stroke Brother     Past Surgical History:  Procedure Laterality Date   ABDOMINAL HYSTERECTOMY     CATARACT EXTRACTION, BILATERAL     TOTAL KNEE ARTHROPLASTY Bilateral    TUBAL LIGATION     Social History   Occupational History   Not on file  Tobacco Use   Smoking status: Former    Types: Cigarettes    Quit date: 1997    Years since quitting: 26.8   Smokeless tobacco: Never  Vaping Use   Vaping Use: Never used  Substance and Sexual Activity   Alcohol use: Yes    Alcohol/week: 3.0  standard drinks of alcohol    Types: 3 Glasses of wine per week    Comment: occ wine   Drug use: No   Sexual activity: Not on file

## 2022-04-10 ENCOUNTER — Telehealth: Payer: Self-pay | Admitting: Orthopedic Surgery

## 2022-04-10 NOTE — Telephone Encounter (Signed)
Pt called and stated injections did not work. Please call pt at [phone]

## 2022-04-10 NOTE — Telephone Encounter (Signed)
Is likely not coming from her hip.  Hip injection did not give her sustained relief.  This is likely coming from her back.  If she does not want back surgery then realistically she may want to see Dr. Ernestina Patches again for her back injections.

## 2022-04-11 DIAGNOSIS — R519 Headache, unspecified: Secondary | ICD-10-CM | POA: Diagnosis not present

## 2022-04-11 NOTE — Telephone Encounter (Signed)
IC talked with patient. She is agreeable to seeing Dr Ernestina Patches as she does not want back surgery at this time. Ok to schedule patient for OV?

## 2022-04-12 NOTE — Telephone Encounter (Signed)
Spoke with patient and scheduled ov for 04/13/22

## 2022-04-13 ENCOUNTER — Ambulatory Visit (INDEPENDENT_AMBULATORY_CARE_PROVIDER_SITE_OTHER): Payer: No Typology Code available for payment source | Admitting: Physical Medicine and Rehabilitation

## 2022-04-13 ENCOUNTER — Encounter: Payer: Self-pay | Admitting: Physical Medicine and Rehabilitation

## 2022-04-13 DIAGNOSIS — M48062 Spinal stenosis, lumbar region with neurogenic claudication: Secondary | ICD-10-CM

## 2022-04-13 DIAGNOSIS — M5416 Radiculopathy, lumbar region: Secondary | ICD-10-CM

## 2022-04-13 DIAGNOSIS — M4726 Other spondylosis with radiculopathy, lumbar region: Secondary | ICD-10-CM

## 2022-04-13 DIAGNOSIS — M797 Fibromyalgia: Secondary | ICD-10-CM | POA: Diagnosis not present

## 2022-04-13 NOTE — Progress Notes (Signed)
Pain in left buttock into the lateral thigh, the last injection only lasted 2-3 days, she states that Dr. Marlou Sa did an injection the other day in the groin and was told it was for her hip, but it did not help.  Pain scale: 5/10 Activities: 8-9/10 mainly in the back   Pain with walking.

## 2022-04-13 NOTE — Progress Notes (Signed)
Barbara Patton - 81 y.o. female MRN [id number]  Date of birth: 1941-01-27  Office Visit Note: Visit Date: 04/13/2022 PCP: Seward Carol, MD Referred by: Seward Carol, MD  Subjective: Chief Complaint  Patient presents with   Lower Back - Pain   HPI: Barbara Patton is a 81 y.o. female who comes in today for evaluation of chronic, worsening and severe left sided lower back pain radiating to buttock, hip and down left lateral leg to foot. Pain ongoing for several years and worsens with standing, walking and activity. She describes pain as a sharp and sore sensation, currently rate as 7 out of 10. Also reports generalized pain and tenderness all over body, she does carry diagnosis of fibromyalgia. Some relief of pain with home exercise regimen, rest and use of medications. Is prescribed 60 tablets of 50 mg Tramadol monthly by her primary care provider Dr. Seward Carol. Also reports good pain relief with topical CBD cream. History of formal physical therapy in 2022 that did include dry needling treatments with our in house team, reports some relief of pain with these treatments. 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 Patient has undergone multiple injections in our office including lumbar facet joint injections, lumbar epidural steroid injections, left intra-articular hip injections and bilateral greater trochanter injections. Patient reports minimal short term relief with these procedures. Patient is very active, does participate in bowling leagues on Monday evenings, also exercises at [facility] multiple times a week. Patient denies focal weakness, numbness and tingling. Patient denies recent trauma or falls.    Review of Systems  Musculoskeletal:  Positive for back pain.  Neurological:  Negative for tingling, sensory change, focal weakness and weakness.  All other systems reviewed and are negative.  Otherwise per  HPI.  Assessment & Plan: Visit Diagnoses:    ICD-10-CM   1. Lumbar radiculopathy  M54.16     2. Other spondylosis with radiculopathy, lumbar region  M47.26     3. Spinal stenosis of lumbar region with neurogenic claudication  M48.062     4. Fibromyalgia  M79.7        Plan: Findings:  Chronic, worsening and severe left sided lower back pain radiating to buttock, hip and down left lateral leg to foot.  Patient continues to have severe pain despite good conservative therapies such as formal physical therapy, home exercise regimen, rest and use of medications.  Patient has failed conservative therapies, minimal short-term relief with previous injections.  Patient's clinical presentation and exam are consistent with L5 nerve pattern.  I also feel her fibromyalgia is working to exacerbate her symptoms.  At this time we do not recommend repeating injections as these procedures have not provided her with long-lasting benefit.  I instructed patient to follow-up with her primary care provider Dr. Seward Carol and also Dr. Leigh Aurora with [facility] to discuss further treatment. I emphasized the importance of regular physical activity, stress reduction and good sleep patterns in the treatment of fibromyalgia.  If her symptoms change or worsen we could look at obtaining new lumbar MRI imaging. Patient instructed to remain active as tolerated.  No red flag symptoms noted upon exam today.    Meds & Orders: No orders of the defined types were placed in this encounter.  No orders of the defined types were placed in this encounter.   Follow-up: Return if symptoms worsen or fail to improve.   Procedures: No procedures  performed      Clinical History: No specialty comments available.   She reports that she quit smoking about 26 years ago. Her smoking use included cigarettes. She has never used smokeless tobacco. No results for input(s): "HGBA1C", "LABURIC" in the last 8760  hours.  Objective:  VS:  HT:    WT:   BMI:     BP:   HR: bpm  TEMP: ( )  RESP:  Physical Exam Vitals and nursing note reviewed.  HENT:     Head: Normocephalic and atraumatic.     Right Ear: External ear normal.     Left Ear: External ear normal.     Nose: Nose normal.     Mouth/Throat:     Mouth: Mucous membranes are moist.  Eyes:     Extraocular Movements: Extraocular movements intact.  Cardiovascular:     Rate and Rhythm: Normal rate.     Pulses: Normal pulses.  Pulmonary:     Effort: Pulmonary effort is normal.  Abdominal:     General: Abdomen is flat. There is no distension.  Musculoskeletal:        General: Tenderness present.     Cervical back: Normal range of motion.     Comments: Pt rises from seated position to standing without difficulty. Good lumbar range of motion. Strong distal strength without clonus, no pain upon palpation of greater trochanters. Sensation intact bilaterally. Dysesthesias noted to left L5 dermatome. Walks independently, gait steady.   Skin:    General: Skin is warm and dry.     Capillary Refill: Capillary refill takes less than 2 seconds.  Neurological:     General: No focal deficit present.     Mental Status: She is alert and oriented to person, place, and time.  Psychiatric:        Mood and Affect: Mood normal.        Behavior: Behavior normal.     Ortho Exam  Imaging: No results found.  Past Medical/Family/Surgical/Social History: Medications & Allergies reviewed per EMR, new medications updated. 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   Allergic rhinitis 07/26/2015   Anxiety 07/26/2015   Benign essential hypertension 07/26/2015   Dysfunction of left eustachian tube 07/26/2015   Esophageal reflux 07/26/2015   Fibromyalgia 07/26/2015   Pure hypercholesterolemia 07/26/2015   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 05/03/2014   Pes anserinus bursitis of left knee 12/04/2013   Trochanteric bursitis of left hip 08/07/2013   Leg swelling 12/06/2011   Glaucoma 11/02/2011   HLD (hyperlipidemia) 11/02/2011   HTN (hypertension) 11/02/2011   Hypothyroid 11/02/2011   OA (osteoarthritis) 11/02/2011   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 ([place]) 03/16/2011   Presence of artificial knee joint 03/16/2011   Past Medical History:  Diagnosis Date   Glaucoma    High cholesterol    Hypertension    Lupus ([place])    Thyroid disease    Family History  Problem Relation Age of Onset  Cancer Father    Cancer Brother    Cancer Brother    Stroke Brother    Past Surgical History:  Procedure Laterality Date   ABDOMINAL HYSTERECTOMY     CATARACT EXTRACTION, BILATERAL     TOTAL KNEE ARTHROPLASTY Bilateral    TUBAL LIGATION     Social History   Occupational History   Not on file  Tobacco Use   Smoking status: Former    Types: Cigarettes    Quit date: 1997    Years since quitting: 26.8   Smokeless tobacco: Never  Vaping Use   Vaping Use: Never used  Substance and Sexual Activity   Alcohol use: Yes    Alcohol/week: 3.0 standard drinks of alcohol    Types: 3 Glasses of wine per week    Comment: occ wine   Drug use: No   Sexual activity: Not on file

## 2022-04-17 DIAGNOSIS — R338 Other retention of urine: Secondary | ICD-10-CM | POA: Diagnosis not present

## 2022-04-17 DIAGNOSIS — N139 Obstructive and reflux uropathy, unspecified: Secondary | ICD-10-CM | POA: Diagnosis not present

## 2022-04-17 DIAGNOSIS — R350 Frequency of micturition: Secondary | ICD-10-CM | POA: Diagnosis not present

## 2022-05-17 DIAGNOSIS — I10 Essential (primary) hypertension: Secondary | ICD-10-CM | POA: Diagnosis not present

## 2022-05-17 DIAGNOSIS — I5189 Other ill-defined heart diseases: Secondary | ICD-10-CM | POA: Diagnosis not present

## 2022-05-17 DIAGNOSIS — E039 Hypothyroidism, unspecified: Secondary | ICD-10-CM | POA: Diagnosis not present

## 2022-05-17 DIAGNOSIS — G8929 Other chronic pain: Secondary | ICD-10-CM | POA: Diagnosis not present

## 2022-05-17 DIAGNOSIS — E7800 Pure hypercholesterolemia, unspecified: Secondary | ICD-10-CM | POA: Diagnosis not present

## 2022-05-17 DIAGNOSIS — N320 Bladder-neck obstruction: Secondary | ICD-10-CM | POA: Diagnosis not present

## 2022-05-17 DIAGNOSIS — M329 Systemic lupus erythematosus, unspecified: Secondary | ICD-10-CM | POA: Diagnosis not present

## 2022-06-12 NOTE — Progress Notes (Unsigned)
Assessment/Plan:    1.  Hand pain and paresthesias  -EMG negative  -MRI cervical spine essentially unrevealing  -No evidence for a neurologic condition to have this and will need to follow-up with primary care if condition persists.  2.  Tremor  -Very little tremor noted on examination, but that was even prior to medicine.   Subjective:   KRISTIAN HAZZARD was seen today.  Only 4 days after our last visit, the patient was in the emergency room with a very elevated blood pressure (198/100) and confusion and probable emesis on her clothing.  Emergency room was concerned that the patient had hypertensive emergency.  Patient was started on primidone last visit and reports today that ***  Current movement disorder medications: Primidone, 50 mg at that   ALLERGIES:   Allergies  Allergen Reactions   Codeine Other (See Comments)    Kept awake   Penicillin G Other (See Comments)   Penicillins Hives    Did it involve swelling of the face/tongue/throat, SOB, or low BP? N Did it involve sudden or severe rash/hives, skin peeling, or any reaction on the inside of your mouth or nose? Y Did you need to seek medical attention at a hospital or doctor's office? N When did it last happen?   Over 10 Years Ago    If all above answers are "NO", may proceed with cephalosporin use.     CURRENT MEDICATIONS:  Outpatient Encounter Medications as of 06/14/2022  Medication Sig   acetaminophen (TYLENOL) 325 MG tablet Take 650 mg by mouth 2 (two) times daily as needed (pain).   betamethasone dipropionate (DIPROLENE) 0.05 % ointment Apply 1 application topically 2 (two) times daily as needed (dry skin irritation).    Cholecalciferol (VITAMIN D-3 PO) Take 1 tablet by mouth daily.   diclofenac Sodium (VOLTAREN) 1 % GEL Add'l Sig Add'l Sig topical Add'l Sig   DULoxetine (CYMBALTA) 30 MG capsule    furosemide (LASIX) 40 MG tablet Take 40 mg by mouth daily.   hydrochlorothiazide (HYDRODIURIL) 25 MG tablet  Take 25 mg by mouth daily.   hydroxychloroquine (PLAQUENIL) 200 MG tablet Take 200 mg by mouth daily.    hydrOXYzine (ATARAX/VISTARIL) 10 MG tablet Take 10 mg by mouth every 6 (six) hours as needed for itching or anxiety.   levothyroxine (SYNTHROID) 100 MCG tablet Take 100 mcg by mouth daily.   methocarbamol (ROBAXIN) 500 MG tablet Take 1 tablet (500 mg total) by mouth 2 (two) times daily as needed.   Multiple Vitamins-Minerals (MULTIVITAMIN PO) Take 1 tablet by mouth daily.    omeprazole (PRILOSEC) 40 MG capsule Take 40 mg by mouth daily.    oxybutynin (DITROPAN) 5 MG tablet Take 5 mg by mouth 3 (three) times daily.   primidone (MYSOLINE) 50 MG tablet Take 1 tablet twice a day   simvastatin (ZOCOR) 20 MG tablet Take 20 mg by mouth every evening.    tolterodine (DETROL LA) 4 MG 24 hr capsule    traMADol (ULTRAM) 50 MG tablet Take 50 mg by mouth 2 (two) times daily as needed (pain).   [DISCONTINUED] estradiol (ESTRACE) 0.5 MG tablet    No facility-administered encounter medications on file as of 06/14/2022.     Objective:    PHYSICAL EXAMINATION:    VITALS:   There were no vitals filed for this visit.     GEN:  The patient appears stated age and is in NAD. HEENT:  Normocephalic, atraumatic.  The mucous membranes are moist.  The superficial temporal arteries are without ropiness or tenderness.   Neurological examination:  Orientation: The patient is alert and oriented x3. Cranial nerves: There is good facial symmetry. The speech is fluent and clear. Soft palate rises symmetrically and there is no tongue deviation. Hearing is intact to conversational tone. Sensation: Sensation is intact to light touch throughout Motor: Strength is at least antigravity x4.  Movement examination: Tone: There is normal tone in the bilateral upper extremities.  The tone in the lower extremities is normal.  Abnormal movements: There is no rest tremor, even with distraction procedures.  No postural  tremor.  She has no intention tremor.  She has some mild tremor when given a weight (different than last visit).  No trouble with Archimedes spirals. Coordination:  There is no decremation with RAM's, with any form of RAMS, including alternating supination and pronation of the forearm, hand opening and closing, finger taps, heel taps and toe taps  Total time spent on today's visit was *** minutes, including both face-to-face time and nonface-to-face time.  Time included that spent on review of records (prior notes available to me/labs/imaging if pertinent), discussing treatment and goals, answering patient's questions and coordinating care.   Cc:  Seward Carol, MD

## 2022-06-14 ENCOUNTER — Encounter: Payer: Self-pay | Admitting: Neurology

## 2022-06-14 ENCOUNTER — Ambulatory Visit (INDEPENDENT_AMBULATORY_CARE_PROVIDER_SITE_OTHER): Payer: Medicare PPO | Admitting: Neurology

## 2022-06-14 VITALS — BP 164/62 | HR 91 | Ht 67.2 in | Wt 191.2 lb

## 2022-06-14 DIAGNOSIS — R251 Tremor, unspecified: Secondary | ICD-10-CM | POA: Diagnosis not present

## 2022-06-14 MED ORDER — PROPRANOLOL HCL 20 MG PO TABS: 20.0000 mg | ORAL_TABLET | Freq: Two times a day (BID) | ORAL | 1 refills | Status: DC

## 2022-06-14 NOTE — Patient Instructions (Signed)
STOP primidone START propranolol 20 mg, 1 tablet daily for 1 week and then 1 tablet twice per day thereafter  The physicians and staff at [facility] are committed to providing excellent care. You may receive a survey requesting feedback about your experience at our office. We strive to receive "very good" responses to the survey questions. If you feel that your experience would prevent you from giving the office a "very good " response, please contact our office to try to remedy the situation. We may be reached at [phone]. Thank you for taking the time out of your busy day to complete the survey.

## 2022-06-16 IMAGING — MR MR LUMBAR SPINE W/O CM
5 series · 47 of 48 positions shown · non-contrast
Comparison: 10/04/2009

CLINICAL DATA: Chronic low back pain with left leg pain for several
years

EXAM:
MRI LUMBAR SPINE WITHOUT CONTRAST
TECHNIQUE: Multiplanar, multisequence MR imaging of the lumbar spine was
performed. No intravenous contrast was administered.

[Series 3: tirm sag · sagittal · 4.0mm · 0.55mm/px · 5 of 13 slices shown]
[im 1/13]
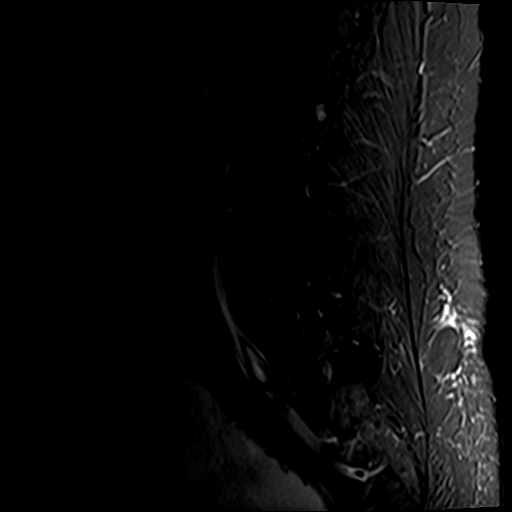
[im 4/13]
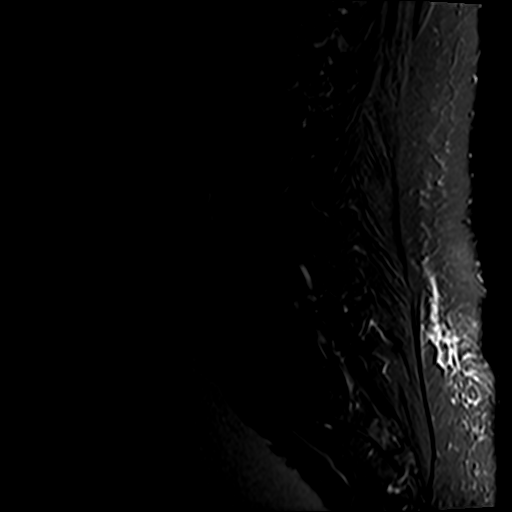
[im 7/13]
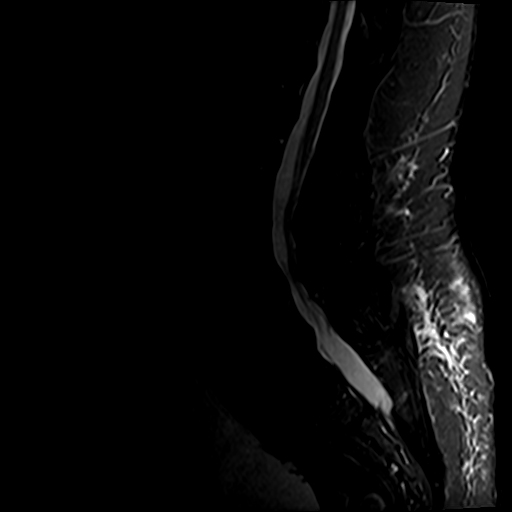
[im 10/13]
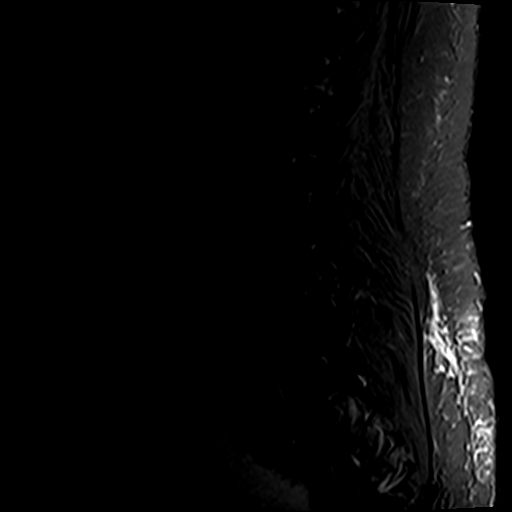
[im 13/13]
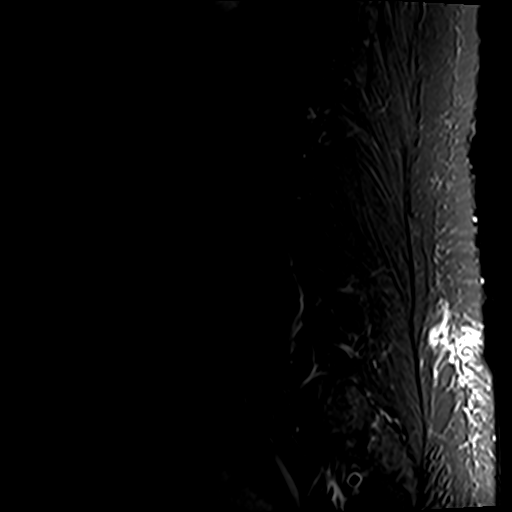

[Series 4: T2 · sagittal · 4.0mm · 0.88mm/px · 5 of 13 slices shown (1 of 2)]
[im 1/13]
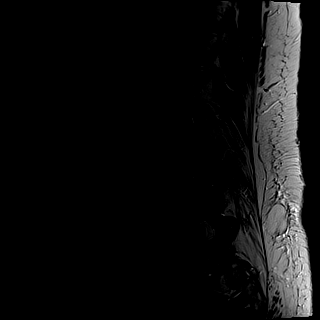
[im 4/13]
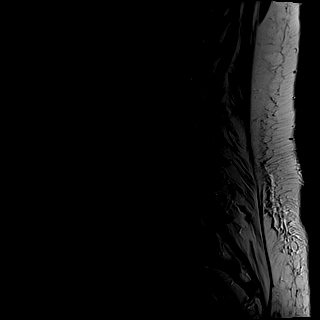
[im 7/13]
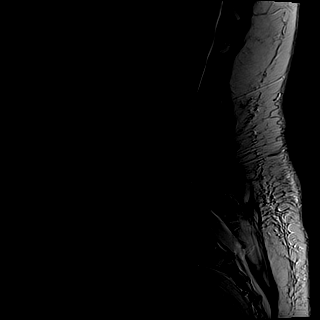
[im 10/13]
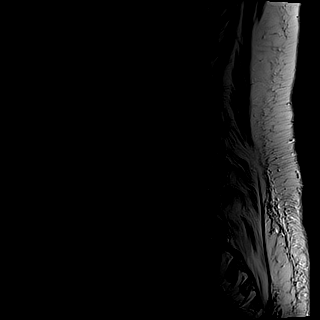
[im 13/13]
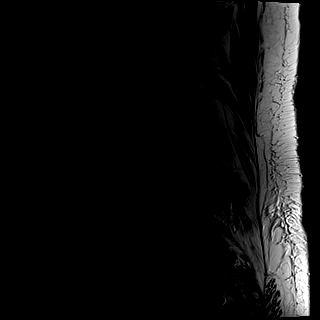

[Series 5: T1 · sagittal · 4.0mm · 0.88mm/px · 6 of 13 slices shown (1 of 2)]
[im 1/13]
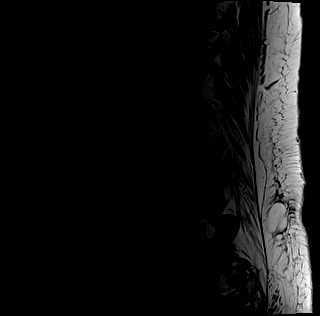
[im 3/13]
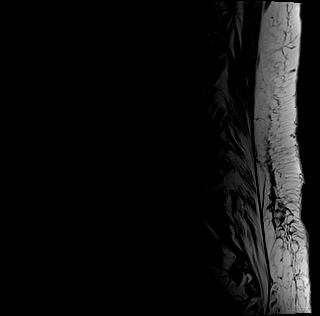
[im 5/13]
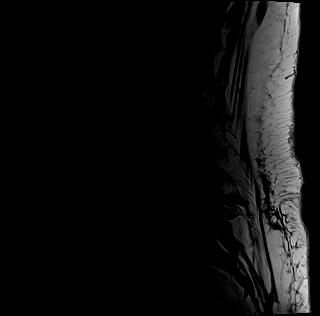
[im 8/13]
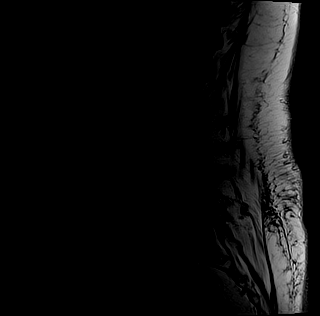
[im 10/13]
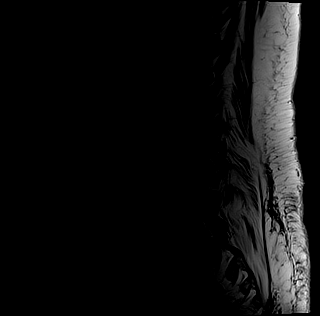
[im 13/13]
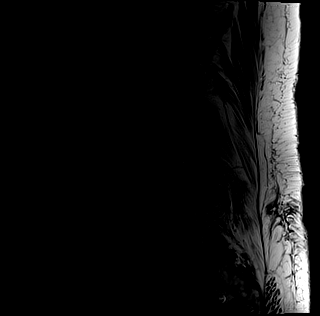

[Series 6: T1 · axial · 4.0mm · 0.78mm/px · z∈[-57,+161]mm · 15 of 37 slices shown (2 of 2)]
[im 1/37]
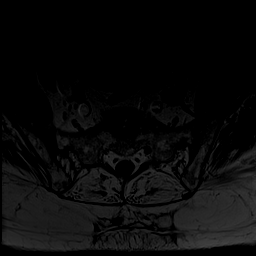
[im 3/37]
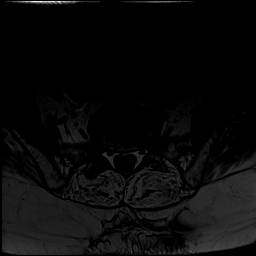
[im 5/37]
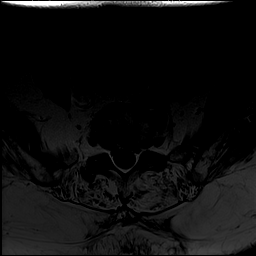
[im 8/37]
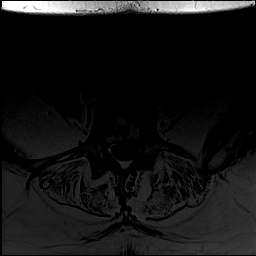
[im 10/37]
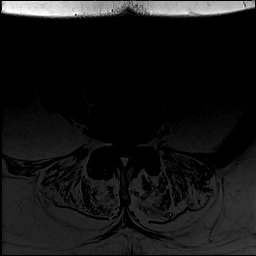
[im 13/37]
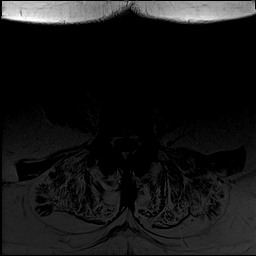
[im 15/37]
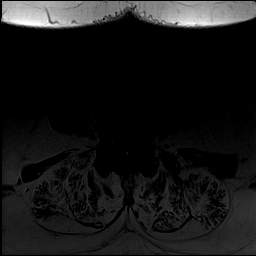
[im 17/37]
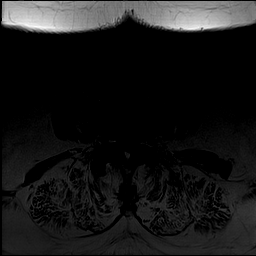
[im 20/37]
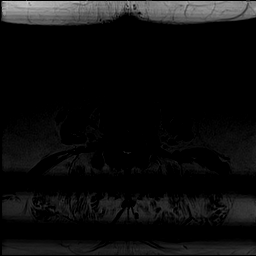
[im 22/37]
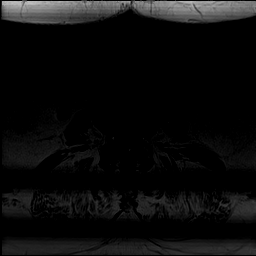
[im 25/37]
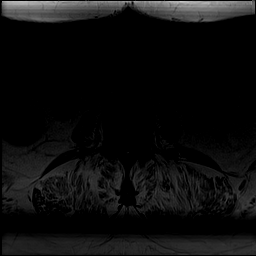
[im 27/37]
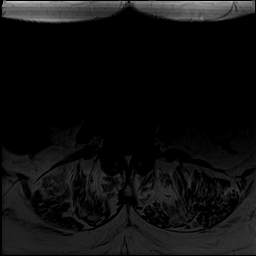
[im 29/37]
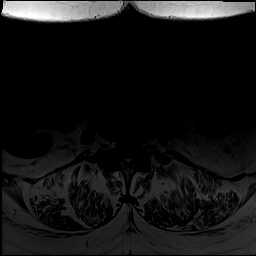
[im 32/37]
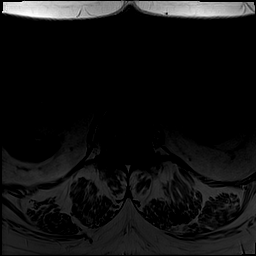
[im 37/37]
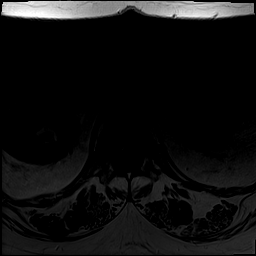

[Series 7: T2 · axial · 4.0mm · 0.78mm/px · z∈[-52,+160]mm · 16 of 37 slices shown (2 of 2)]
[im 1/37]
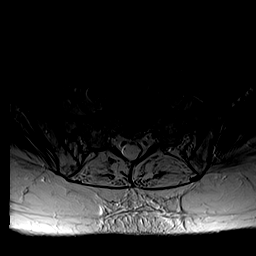
[im 3/37]
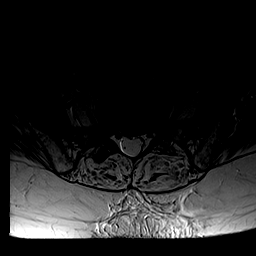
[im 5/37]
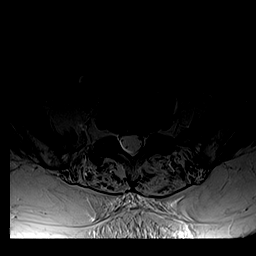
[im 8/37]
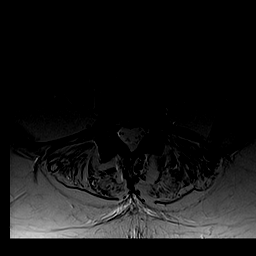
[im 10/37]
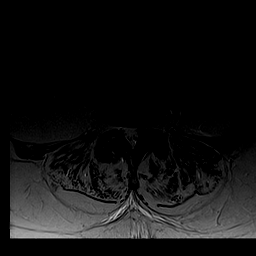
[im 13/37]
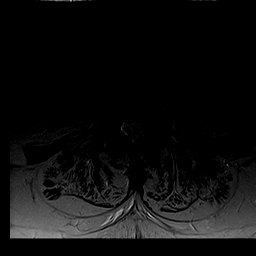
[im 15/37]
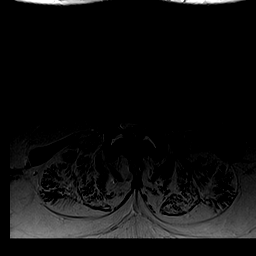
[im 17/37]
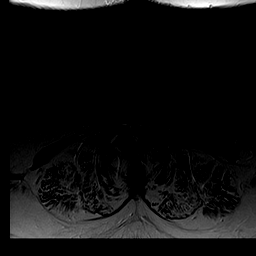
[im 20/37]
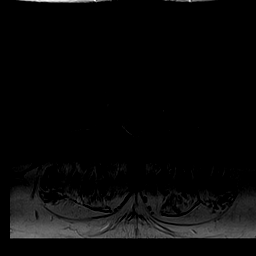
[im 22/37]
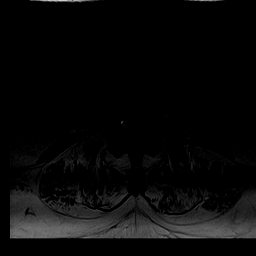
[im 25/37]
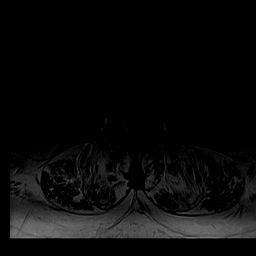
[im 27/37]
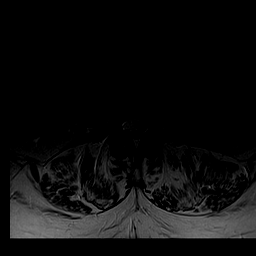
[im 29/37]
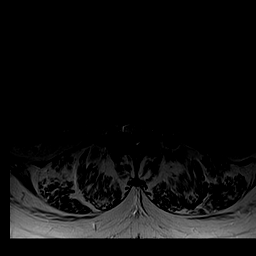
[im 32/37]
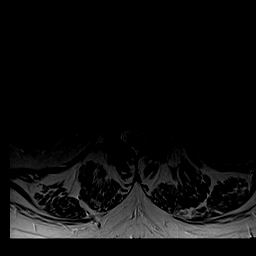
[im 34/37]
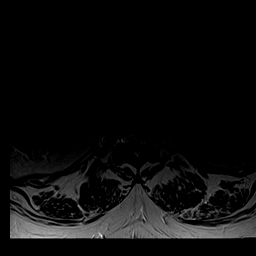
[im 37/37]
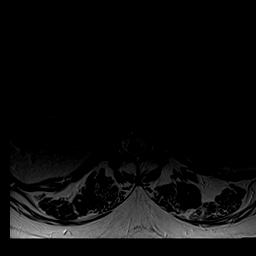

[47 of 48 positions shown; findings below may reference images not displayed]

FINDINGS: Segmentation:  Standard lumbar numbering

Alignment:  Mild anterolisthesis at L3-4 and L4-5.

Vertebrae:  No fracture, evidence of discitis, or bone lesion.

Conus medullaris and cauda equina: Conus extends to the L1-2 level.
Conus and cauda equina appear normal.

Paraspinal and other soft tissues: Distended bladder which is
trabeculated. Prominent atrophy of intrinsic back muscles. Partially
covered sigmoid diverticulosis.

Disc levels:

T12- L1: Minor spondylosis and annulus bulging

L1-L2: Unremarkable for age

L2-L3: Mild facet spurring and annulus bulging.

L3-L4: Prominent facet osteoarthritis with spurring and effusions in
the gaping joint spaces. Mild anterolisthesis. Mild disc narrowing
and bulging. There is moderate spinal stenosis but no static nerve
root compression.

L4-L5: Facet osteoarthritis with spurring and mild anterolisthesis.
Mild disc bulging. No neural compression

L5-S1:Mild facet spurring and disc bulging.
IMPRESSION: 1. Facet osteoarthritis that is advanced at L3-4 and L4-5 where
there is mild anterolisthesis.
2. Moderate spinal stenosis at L3-4 without static compression,
progressed from 3455.
3. Trabeculated bladder with prominent distension suggesting chronic
outlet obstruction.

## 2022-06-19 ENCOUNTER — Telehealth: Payer: Self-pay | Admitting: Neurology

## 2022-06-19 NOTE — Telephone Encounter (Signed)
Pt called in stating the directions Dr. Carles Collet told her to take with the propranolol is not what the prescription says when she picked it up from the pharmacy. She wants to clarify this with someone. She stated a message can be left is she cannot answer.

## 2022-06-19 NOTE — Telephone Encounter (Signed)
Called patient and left message that this is on her after visit summary and the office visit notes from her visit -start propranolol 20 mg bid. Hope that this will help sensation of tremor and her BP as well

## 2022-06-28 DIAGNOSIS — R21 Rash and other nonspecific skin eruption: Secondary | ICD-10-CM | POA: Diagnosis not present

## 2022-06-28 DIAGNOSIS — R040 Epistaxis: Secondary | ICD-10-CM | POA: Diagnosis not present

## 2022-07-11 ENCOUNTER — Ambulatory Visit (INDEPENDENT_AMBULATORY_CARE_PROVIDER_SITE_OTHER): Payer: Medicare PPO | Admitting: Podiatry

## 2022-07-11 DIAGNOSIS — L603 Nail dystrophy: Secondary | ICD-10-CM

## 2022-07-11 NOTE — Progress Notes (Signed)
Subjective:  Patient ID: Barbara Patton, female    DOB: [birth year],  MRN: [id number]  Chief Complaint  Patient presents with   Toe Pain    Right foot 2nd toe pain     82 y.o. female presents with the above complaint.  Patient presents with right second digit dystrophic nail hurts with ambulation worse with pressure she would like to have it removed she would like to make appointment she states she has been doing this for quite some time.  She is not diabetic.   Review of Systems: Negative except as noted in the HPI. Denies N/V/F/Ch.  Past Medical History:  Diagnosis Date   Glaucoma    High cholesterol    Hypertension    Lupus ([facility])    Thyroid disease     Current Outpatient Medications:    acetaminophen (TYLENOL) 325 MG tablet, Take 650 mg by mouth 2 (two) times daily as needed (pain)., Disp: , Rfl:    betamethasone dipropionate (DIPROLENE) 0.05 % ointment, Apply 1 application topically 2 (two) times daily as needed (dry skin irritation). , Disp: , Rfl:    Cholecalciferol (VITAMIN D-3 PO), Take 1 tablet by mouth daily., Disp: , Rfl:    diclofenac Sodium (VOLTAREN) 1 % GEL, Add'l Sig Add'l Sig topical Add'l Sig, Disp: , Rfl:    DULoxetine (CYMBALTA) 30 MG capsule, , Disp: , Rfl:    furosemide (LASIX) 40 MG tablet, Take 40 mg by mouth daily., Disp: , Rfl:    hydrochlorothiazide (HYDRODIURIL) 25 MG tablet, Take 25 mg by mouth daily., Disp: , Rfl:    hydroxychloroquine (PLAQUENIL) 200 MG tablet, Take 200 mg by mouth daily. , Disp: , Rfl:    hydrOXYzine (ATARAX/VISTARIL) 10 MG tablet, Take 10 mg by mouth every 6 (six) hours as needed for itching or anxiety., Disp: , Rfl:    levothyroxine (SYNTHROID) 100 MCG tablet, Take 100 mcg by mouth daily., Disp: , Rfl:    methocarbamol (ROBAXIN) 500 MG tablet, Take 1 tablet (500 mg total) by mouth 2 (two) times daily as needed., Disp: 180 tablet, Rfl: 0   Multiple Vitamins-Minerals (MULTIVITAMIN PO), Take 1 tablet by mouth daily. , Disp: , Rfl:     omeprazole (PRILOSEC) 40 MG capsule, Take 40 mg by mouth daily. , Disp: , Rfl:    oxybutynin (DITROPAN) 5 MG tablet, Take 5 mg by mouth 3 (three) times daily., Disp: , Rfl:    propranolol (INDERAL) 20 MG tablet, Take 1 tablet (20 mg total) by mouth 2 (two) times daily., Disp: 180 tablet, Rfl: 1   simvastatin (ZOCOR) 20 MG tablet, Take 20 mg by mouth every evening. , Disp: , Rfl:    tolterodine (DETROL LA) 4 MG 24 hr capsule, , Disp: , Rfl: 2   traMADol (ULTRAM) 50 MG tablet, Take 50 mg by mouth 2 (two) times daily as needed (pain)., Disp: , Rfl:   Social History   Tobacco Use  Smoking Status Former   Types: Cigarettes   Quit date: 1997   Years since quitting: 27.1  Smokeless Tobacco Never    Allergies  Allergen Reactions   Codeine Other (See Comments)    Kept awake   Penicillin G Other (See Comments)   Penicillins Hives    Did it involve swelling of the face/tongue/throat, SOB, or low BP? N Did it involve sudden or severe rash/hives, skin peeling, or any reaction on the inside of your mouth or nose? Y Did you need to seek medical attention at a  hospital or doctor's office? N When did it last happen?   Over 10 Years Ago    If all above answers are "NO", may proceed with cephalosporin use.    Objective:  There were no vitals filed for this visit. There is no height or weight on file to calculate BMI. Constitutional Well developed. Well nourished.  Vascular Dorsalis pedis pulses palpable bilaterally. Posterior tibial pulses palpable bilaterally. Capillary refill normal to all digits.  No cyanosis or clubbing noted. Pedal hair growth normal.  Neurologic Normal speech. Oriented to person, place, and time. Epicritic sensation to light touch grossly present bilaterally.  Dermatologic Pain on palpation of the entire/total nail on 2nd digit of the right No other open wounds. No skin lesions.  Orthopedic: Normal joint ROM without pain or crepitus bilaterally. No visible  deformities. No bony tenderness.   Radiographs: None Assessment:   1. Nail dystrophy    Plan:  Patient was evaluated and treated and all questions answered.  Nail contusion/dystrophy second digit, right with underlying ingrown -Patient elects to proceed with minor surgery to remove entire toenail today. Consent reviewed and signed by patient. -Entire/total nail excised. See procedure note. -Educated on post-procedure care including soaking. Written instructions provided and reviewed. -Patient to follow up in 2 weeks for nail check.  Procedure: Excision of entire/total nail with phenol Matrixectomy Location: Right 2nd toe digit Anesthesia: Lidocaine 1% plain; 1.5 mL and Marcaine 0.5% plain; 1.5 mL, digital block. Skin Prep: Betadine. Dressing: Silvadene; telfa; dry, sterile, compression dressing. Technique: Following skin prep, the toe was exsanguinated and a tourniquet was secured at the base of the toe. The affected nail border was freed and excised.  Phenol matricectomy was performed in standard technique the tourniquet was then removed and sterile dressing applied. Disposition: Patient tolerated procedure well. Patient to return in 2 weeks for follow-up.   No follow-ups on file.

## 2022-07-30 ENCOUNTER — Ambulatory Visit (HOSPITAL_COMMUNITY)
Admission: EM | Admit: 2022-07-30 | Discharge: 2022-07-30 | Disposition: A | Discharge status: Home / Self Care | Payer: No Typology Code available for payment source | Attending: Family Medicine | Admitting: Family Medicine

## 2022-07-30 ENCOUNTER — Encounter (HOSPITAL_COMMUNITY): Payer: Self-pay

## 2022-07-30 DIAGNOSIS — L03211 Cellulitis of face: Secondary | ICD-10-CM | POA: Diagnosis not present

## 2022-07-30 MED ORDER — MUPIROCIN 2 % EX OINT: 1.0000 | TOPICAL_OINTMENT | Freq: Two times a day (BID) | CUTANEOUS | 0 refills | Status: DC

## 2022-07-30 MED ORDER — CLINDAMYCIN HCL 300 MG PO CAPS: 300.0000 mg | ORAL_CAPSULE | Freq: Three times a day (TID) | ORAL | 0 refills | Status: AC

## 2022-07-30 NOTE — ED Provider Notes (Signed)
[place]    CSN: [id number] Arrival date & time: 07/30/22  1408      History   Chief Complaint Chief Complaint  Patient presents with   Rash    HPI Barbara Patton is a 82 y.o. female.    Rash  Here for red spots and discomfort on her chin.  They began a few days ago and first there is one on her central chin and then now there is one on the right.  She also has a little lesion inside her right lower lip.  No fever or chills or upper respiratory symptoms or cough.  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   Referred otalgia of left ear 08/23/2015   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   Allergic rhinitis 07/26/2015   Anxiety 07/26/2015   Benign essential hypertension 07/26/2015   Dysfunction of left eustachian tube 07/26/2015   Esophageal reflux 07/26/2015   Fibromyalgia 07/26/2015   Pure hypercholesterolemia 07/26/2015   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 05/03/2014   Pes anserinus bursitis of left knee 12/04/2013   Trochanteric bursitis of left hip 08/07/2013   Leg swelling 12/06/2011   Glaucoma 11/02/2011   HLD (hyperlipidemia) 11/02/2011   HTN (hypertension) 11/02/2011   Hypothyroid 11/02/2011   OA (osteoarthritis) 11/02/2011   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 ([place]) 03/16/2011   Presence of artificial knee joint 03/16/2011    Past Surgical History:   Procedure Laterality Date   ABDOMINAL HYSTERECTOMY     CATARACT EXTRACTION, BILATERAL     TOTAL KNEE ARTHROPLASTY Bilateral    TUBAL LIGATION      OB History   No obstetric history on file.      Home Medications    Prior to Admission medications   Medication Sig Start Date End Date Taking? Authorizing Provider  acetaminophen (TYLENOL) 325 MG tablet Take 650 mg by mouth 2 (two) times daily as needed (pain).   Yes [provider]  Cholecalciferol (VITAMIN D-3 PO) Take 1 tablet by mouth daily.   Yes [provider]  clindamycin (CLEOCIN) 300 MG capsule Take 1 capsule (300 mg total) by mouth 3 (three) times daily for 5 days. 07/30/22 08/04/22 Yes Barrett Henle, MD  diclofenac Sodium (VOLTAREN) 1 % GEL Add'l Sig Add'l Sig topical Add'l Sig 08/21/19  Yes [provider]  DULoxetine (CYMBALTA) 30 MG capsule  11/03/20  Yes [provider]  furosemide (LASIX) 40 MG tablet Take 40 mg by mouth daily. 12/22/19  Yes [provider]  hydroxychloroquine (PLAQUENIL) 200 MG tablet Take 200 mg by mouth daily.  06/19/16  Yes [provider]  levothyroxine (SYNTHROID) 100 MCG tablet Take 100 mcg by mouth daily. 11/11/19  Yes [provider]  Multiple Vitamins-Minerals (MULTIVITAMIN PO) Take 1 tablet by mouth daily.    Yes [provider]  mupirocin ointment (BACTROBAN) 2 %  Apply 1 Application topically 2 (two) times daily. To affected area till better 07/30/22  Yes Felicita Nuncio, Gwenlyn Perking, MD  omeprazole (PRILOSEC) 40 MG capsule Take 40 mg by mouth daily.  03/09/16  Yes [provider]  propranolol (INDERAL) 20 MG tablet Take 1 tablet (20 mg total) by mouth 2 (two) times daily. 06/14/22  Yes Tat, Eustace Quail, DO  simvastatin (ZOCOR) 20 MG tablet Take 20 mg by mouth every evening.  11/06/13  Yes [provider]  traMADol (ULTRAM) 50 MG tablet Take 50 mg by mouth 2 (two) times daily as needed (pain). 01/01/14  Yes [provider]  hydrOXYzine (ATARAX/VISTARIL) 10 MG tablet Take 10 mg by mouth every 6 (six) hours as needed for itching or anxiety. 06/10/17   [provider]  estradiol (ESTRACE) 0.5 MG tablet  02/14/18 11/09/19  [provider]    Family History Family History  Problem Relation Age of Onset   Cancer Father    Cancer Brother    Cancer Brother    Stroke Brother     Social History Social History   Tobacco Use   Smoking status: Former    Types: Cigarettes    Quit date: 1997    Years since quitting: 27.1   Smokeless tobacco: Never  Vaping Use   Vaping Use: Never used  Substance Use Topics   Alcohol use: Yes    Alcohol/week: 3.0 standard drinks of alcohol    Types: 3 Glasses of wine per week    Comment: occ wine   Drug use: No     Allergies   Codeine, Penicillin g, and Penicillins   Review of Systems Review of Systems  Skin:  Positive for rash.     Physical Exam Triage Vital Signs ED Triage Vitals  Enc Vitals Group     BP 07/30/22 1436 (!) 147/71     Pulse Rate 07/30/22 1436 68     Resp 07/30/22 1436 16     Temp 07/30/22 1436 98.2 F (36.8 C)     Temp Source 07/30/22 1436 Oral     SpO2 07/30/22 1436 94 %     Weight 07/30/22 1438 190 lb (86.2 kg)     Height 07/30/22 1438 5' 6.5" (1.689 m)     Head Circumference --      Peak Flow --      Pain Score 07/30/22 1438 3     Pain Loc --      Pain Edu? --      Excl. in [place]? --    No data found.  Updated Vital Signs BP (!) 147/71 (BP Location: Right Arm)   Pulse 68   Temp 98.2 F (36.8 C) (Oral)   Resp 16   Ht 5' 6.5" (1.689 m)   Wt 86.2 kg   SpO2 94%   BMI 30.21 kg/m   Visual Acuity Right Eye Distance:   Left Eye Distance:   Bilateral Distance:    Right Eye Near:   Left Eye Near:    Bilateral Near:     Physical Exam Vitals reviewed.  Constitutional:      General: She is not in acute distress.    Appearance: She is not ill-appearing, toxic-appearing or diaphoretic.  HENT:      Mouth/Throat:     Mouth: Mucous membranes are moist.  Eyes:     Extraocular Movements: Extraocular movements intact.     Conjunctiva/sclera: Conjunctivae normal.     Pupils: Pupils are equal, round,  and reactive to light.  Cardiovascular:     Rate and Rhythm: Normal rate and regular rhythm.     Heart sounds: No murmur heard. Pulmonary:     Effort: Pulmonary effort is normal.     Breath sounds: Normal breath sounds.  Musculoskeletal:     Cervical back: Neck supple.  Lymphadenopathy:     Cervical: No cervical adenopathy.  Skin:    Coloration: Skin is not pale.     Comments: There is some slight induration and mild erythema of the central chin with an eschar in the middle.  The induration and erythema is about a centimeters in diameter.  There is a similar lesion that is about 1.5 cm on the right chin at the cheek.  There are no herpetiform or vesicular lesions.  Neurological:     General: No focal deficit present.     Mental Status: She is alert and oriented to person, place, and time.  Psychiatric:        Behavior: Behavior normal.      UC Treatments / Results  Labs (all labs ordered are listed, but only abnormal results are displayed) Labs Reviewed - No data to display  EKG   Radiology No results found.  Procedures Procedures (including critical care time)  Medications Ordered in UC Medications - No data to display  Initial Impression / Assessment and Plan / UC Course  I have reviewed the triage vital signs and the nursing notes.  Pertinent labs & imaging results that were available during my care of the patient were reviewed by me and considered in my medical decision making (see chart for details).        I am going to send in oral antibiotics and mupirocin.  Final Clinical Impressions(s) / UC Diagnoses   Final diagnoses:  Cellulitis, face     Discharge Instructions      --Take clindamycin 300 mg-- 1 capsule 3 times daily for 5 days  Put mupirocin  ointment on the sore areas twice daily until improved       ED Prescriptions     Medication Sig Dispense Auth. Provider   clindamycin (CLEOCIN) 300 MG capsule Take 1 capsule (300 mg total) by mouth 3 (three) times daily for 5 days. 15 capsule Barrett Henle, MD   mupirocin ointment (BACTROBAN) 2 % Apply 1 Application topically 2 (two) times daily. To affected area till better 22 g Windy Carina Gwenlyn Perking, MD      PDMP not reviewed this encounter.   Barrett Henle, MD 07/30/22 [phone]

## 2022-07-30 NOTE — ED Triage Notes (Signed)
Patient here today for a rash around her mouth X 4 days. Rash is painful and itchy. She feels a throbbing feeling. She tried cortisone ointment yesterday but did not help. No sick contacts.

## 2022-07-30 NOTE — Discharge Instructions (Signed)
--  Take clindamycin 300 mg-- 1 capsule 3 times daily for 5 days  Put mupirocin ointment on the sore areas twice daily until improved

## 2022-07-31 ENCOUNTER — Ambulatory Visit (INDEPENDENT_AMBULATORY_CARE_PROVIDER_SITE_OTHER): Payer: Medicare PPO

## 2022-07-31 ENCOUNTER — Ambulatory Visit (INDEPENDENT_AMBULATORY_CARE_PROVIDER_SITE_OTHER): Payer: Medicare PPO | Admitting: Surgical

## 2022-07-31 ENCOUNTER — Ambulatory Visit: Payer: Self-pay

## 2022-07-31 DIAGNOSIS — M79604 Pain in right leg: Secondary | ICD-10-CM

## 2022-07-31 DIAGNOSIS — M542 Cervicalgia: Secondary | ICD-10-CM

## 2022-07-31 DIAGNOSIS — M1611 Unilateral primary osteoarthritis, right hip: Secondary | ICD-10-CM | POA: Diagnosis not present

## 2022-07-31 DIAGNOSIS — M25522 Pain in left elbow: Secondary | ICD-10-CM

## 2022-07-31 DIAGNOSIS — M25551 Pain in right hip: Secondary | ICD-10-CM | POA: Diagnosis not present

## 2022-07-31 DIAGNOSIS — M25521 Pain in right elbow: Secondary | ICD-10-CM | POA: Diagnosis not present

## 2022-08-01 ENCOUNTER — Encounter: Payer: Self-pay | Admitting: Surgical

## 2022-08-01 MED ORDER — BUPIVACAINE HCL 0.25 % IJ SOLN
4.0000 mL | INTRAMUSCULAR | Status: AC | PRN
  Administered 2022-07-31: 4 mL via INTRA_ARTICULAR

## 2022-08-01 MED ORDER — LIDOCAINE HCL 1 % IJ SOLN
5.0000 mL | INTRAMUSCULAR | Status: AC | PRN
  Administered 2022-07-31: 5 mL

## 2022-08-01 MED ORDER — METHYLPREDNISOLONE ACETATE 40 MG/ML IJ SUSP
40.0000 mg | INTRAMUSCULAR | Status: AC | PRN
  Administered 2022-07-31: 40 mg via INTRA_ARTICULAR

## 2022-08-01 NOTE — Progress Notes (Signed)
Office Visit Note   Patient: Barbara Patton           Date of Birth: May 22, 1941           MRN: [id number] Visit Date: 07/31/2022 Requested by: Seward Carol, MD [address] [place],   [id number] PCP: Seward Carol, MD  Subjective: Chief Complaint  Patient presents with   Right Leg - Pain   Right Elbow - Pain   Left Elbow - Pain    HPI: Barbara Patton is a 82 y.o. female who presents to the office reporting multiple joint complaints.  She complains primarily of right leg/thigh pain as well as bilateral arm pain.  She states that she has increased arm pain over the last month that she mostly localizes to the elbows but has radiation pretty much from the shoulder down to her wrists.  No history of recent injury or fall.  She also describes neck pain and scapular pain.  Left side bothers her more than her right side.  She has no weakness.  Her neck pain feels pretty much same as it always does for her.  She does not have any history of rheumatoid arthritis but she does see a rheumatologist for her lupus.  She is right-hand dominant.  Pain will wake her up at night at times.  Additionally, she complains of right leg pain that she primarily localizes to the groin and the lateral hip.  She also has some buttock pain as well.  She has pain that travels down the right leg to the mid calf and ankle.  She also has history of low back pain.  She has had increased pain over the last several weeks and especially in the last few days.  No falls or injuries regarding her hip.  Has been using CBD cream which is providing some relief for her arms but not much for her legs.  The radiation of the pain to her calf and ankle is a fairly chronic problem for her and this has been going on for years but the increasing groin and lateral hip pain is more of a recent thing over the last several weeks..                ROS: All systems reviewed are negative as they relate to the chief complaint  within the history of present illness.  Patient denies fevers or chills.  Assessment & Plan: Visit Diagnoses:  1. Bilateral elbow joint pain   2. Pain in right leg   3. Pain in right hip   4. Cervicalgia     Plan: Patient is an 82 year old female who presents complaining of bilateral arm pain that mostly she feels in the elbows but diffusely throughout the entire arm as well as right thigh pain with some radiation to the calf.  She has radiographs taken today demonstrating some minimal degenerative changes of either elbow with no acute changes.  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  With most of her pain localizing to the groin, suspect that she has increasing pain from the right hip.  Is not keeping her from bowling as she bowled earlier today.  After discussion of options, plan to try ultrasound-guided right hip injection.  She tolerated the procedure well.  She did have hip effusion noted on ultrasound.  After about 5 minutes, she had greater  than 50% relief of her symptoms with ambulation.  Will see how this does for her.    Regarding the upper extremity pain, seems that with the diffuse nature of the pain as well as the associated neck and scapular pain, this is likely coming from cervical spine pathology.  She does have prior cervical spine MRI scan from about 14 months ago that demonstrated disc bulge with contact against the ventral cord.  Plan to repeat MRI to evaluate for further progression of possible cord compression.  Follow-up after MRI to review results with Dr. Marlou Sa and to evaluate how her hip is doing.  Follow-Up Instructions: No follow-ups on file.   Orders:  Orders Placed This Encounter  Procedures   XR Elbow 2 Views Right   XR Elbow 2 Views Left   XR Lumbar Spine 2-3 Views   XR HIP UNILAT W OR W/O PELVIS 2-3 VIEWS RIGHT   US Guided Needle  Placement - No Linked Charges   MR Cervical Spine w/o contrast   No orders of the defined types were placed in this encounter.     Procedures: Large Joint Inj: R hip joint on 07/31/2022 11:07 AM Indications: pain and diagnostic evaluation Details: 22 G 3.5 in needle, ultrasound-guided anterior approach  Arthrogram: No  Medications: 5 mL lidocaine 1 %; 4 mL bupivacaine 0.25 %; 40 mg methylPREDNISolone acetate 40 MG/ML Outcome: tolerated well, no immediate complications Procedure, treatment alternatives, risks and benefits explained, specific risks discussed. Consent was given by the patient. Immediately prior to procedure a time out was called to verify the correct patient, procedure, equipment, support staff and site/side marked as required. Patient was prepped and draped in the usual sterile fashion.       Clinical Data: No additional findings.  Objective: Vital Signs: There were no vitals taken for this visit.  Physical Exam:  Constitutional: Patient appears well-developed HEENT:  Head: Normocephalic Eyes:EOM are normal Neck: Normal range of motion Cardiovascular: Normal rate Pulmonary/chest: Effort normal Neurologic: Patient is alert Skin: Skin is warm Psychiatric: Patient has normal mood and affect  Ortho Exam: Ortho exam demonstrates right hip with intact hip flexion, quadricep, hamstring, dorsiflexion, plantarflexion strength rated 5/5.  She has positive Stinchfield sign.  Positive FADIR sign.  She ambulates with antalgia localizing pain to her groin when she walks and when she initially gets up from a seated position.  She has moderate tenderness over the greater trochanter but most of her pain seems to localize to her groin especially with Stinchfield sign.  She has intact grip strength, finger abduction, pronation/supination, bicep, tricep, deltoid rated 5/5.  A little bit of extra rotation weakness of the right arm relative to the left rated 5 -/5 relative to 5/5.   She has intact bicep tendon distally bilaterally.  Excellent bicep flexion tricep extension strength.  She has mild to moderate tenderness over the medial epicondyle and both elbows.  No tenderness over the lateral epicondyle in either elbow.  No pain with elbow range of motion.  She has full active and passive range of motion of each elbow.  No cellulitis or skin changes noted.  She has no olecranon bursitis evidence on either elbow.  Specialty Comments:  No specialty comments available.  Imaging: No results found.   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   Menopausal symptom 10/20/2021  Overweight 10/20/2021   Personal history of colonic polyps 10/20/2021   Urinary incontinence 10/20/2021   Globus pharyngeus 05/23/2021   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   Referred otalgia of left ear 08/23/2015   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   Allergic rhinitis 07/26/2015   Anxiety 07/26/2015   Benign essential hypertension 07/26/2015   Dysfunction of left eustachian tube 07/26/2015   Esophageal reflux 07/26/2015   Fibromyalgia 07/26/2015   Pure hypercholesterolemia 07/26/2015   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 05/03/2014   Pes anserinus bursitis of left knee 12/04/2013   Trochanteric bursitis of left hip 08/07/2013   Leg swelling 12/06/2011   Glaucoma 11/02/2011   HLD (hyperlipidemia) 11/02/2011   HTN (hypertension) 11/02/2011   Hypothyroid 11/02/2011   OA (osteoarthritis) 11/02/2011   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 ([place]) 03/16/2011   Presence of artificial knee joint 03/16/2011   Past Medical History:  Diagnosis Date   Glaucoma    High cholesterol    Hypertension    Lupus ([place])    Thyroid disease      Family History  Problem Relation Age of Onset   Cancer Father    Cancer Brother    Cancer Brother    Stroke Brother     Past Surgical History:  Procedure Laterality Date   ABDOMINAL HYSTERECTOMY     CATARACT EXTRACTION, BILATERAL     TOTAL KNEE ARTHROPLASTY Bilateral    TUBAL LIGATION     Social History   Occupational History   Not on file  Tobacco Use   Smoking status: Former    Types: Cigarettes    Quit date: 1997    Years since quitting: 27.1   Smokeless tobacco: Never  Vaping Use   Vaping Use: Never used  Substance and Sexual Activity   Alcohol use: Yes    Alcohol/week: 3.0 standard drinks of alcohol    Types: 3 Glasses of wine per week    Comment: occ wine   Drug use: No   Sexual activity: Not on file

## 2022-08-02 ENCOUNTER — Telehealth: Payer: Self-pay | Admitting: Surgical

## 2022-08-02 DIAGNOSIS — Z961 Presence of intraocular lens: Secondary | ICD-10-CM | POA: Diagnosis not present

## 2022-08-02 DIAGNOSIS — H401132 Primary open-angle glaucoma, bilateral, moderate stage: Secondary | ICD-10-CM | POA: Diagnosis not present

## 2022-08-02 DIAGNOSIS — H3500 Unspecified background retinopathy: Secondary | ICD-10-CM | POA: Diagnosis not present

## 2022-08-02 NOTE — Telephone Encounter (Signed)
I think based on her last visit, this could be some trochanteric pain or may be referred pain from her lumbar spine.  She definitely had some component of hip arthritis causing her groin pain as well as some of her thigh pain based on how much relief she initially got from the cortisone injection.  I think before we do anything though, I would give it a solid 1 to 2 weeks for the cortisone injection to take full effect.  She can come in next week if she is still having a lot of symptoms.

## 2022-08-02 NOTE — Telephone Encounter (Signed)
Patient aware of the below message from [facility]

## 2022-08-02 NOTE — Telephone Encounter (Signed)
Patient called asked if she can get a cortisone injection in her right thigh?  Patient said she is still having pain in her right thigh.The number to contact patient is [phone]

## 2022-08-04 DIAGNOSIS — B029 Zoster without complications: Secondary | ICD-10-CM | POA: Diagnosis not present

## 2022-08-08 ENCOUNTER — Telehealth: Payer: Self-pay | Admitting: Surgical

## 2022-08-08 NOTE — Telephone Encounter (Signed)
Called pt 1X left vm for pt to call and set an MRI review appt with [facility] after 3/14.

## 2022-08-17 ENCOUNTER — Ambulatory Visit
Admission: RE | Admit: 2022-08-17 | Discharge: 2022-08-17 | Disposition: A | Discharge status: Home / Self Care | Payer: Medicare PPO | Source: Ambulatory Visit | Attending: Surgical | Admitting: Surgical

## 2022-08-17 DIAGNOSIS — M542 Cervicalgia: Secondary | ICD-10-CM | POA: Diagnosis not present

## 2022-08-17 DIAGNOSIS — M4802 Spinal stenosis, cervical region: Secondary | ICD-10-CM | POA: Diagnosis not present

## 2022-08-22 ENCOUNTER — Other Ambulatory Visit (INDEPENDENT_AMBULATORY_CARE_PROVIDER_SITE_OTHER): Payer: Medicare PPO

## 2022-08-22 ENCOUNTER — Encounter: Payer: Self-pay | Admitting: Surgical

## 2022-08-22 ENCOUNTER — Ambulatory Visit (INDEPENDENT_AMBULATORY_CARE_PROVIDER_SITE_OTHER): Payer: Medicare PPO | Admitting: Surgical

## 2022-08-22 ENCOUNTER — Other Ambulatory Visit: Payer: Self-pay

## 2022-08-22 DIAGNOSIS — M25531 Pain in right wrist: Secondary | ICD-10-CM

## 2022-08-22 DIAGNOSIS — M25532 Pain in left wrist: Secondary | ICD-10-CM

## 2022-08-22 DIAGNOSIS — M7061 Trochanteric bursitis, right hip: Secondary | ICD-10-CM | POA: Diagnosis not present

## 2022-08-22 DIAGNOSIS — M25511 Pain in right shoulder: Secondary | ICD-10-CM

## 2022-08-22 DIAGNOSIS — M25512 Pain in left shoulder: Secondary | ICD-10-CM

## 2022-08-22 DIAGNOSIS — M7062 Trochanteric bursitis, left hip: Secondary | ICD-10-CM | POA: Diagnosis not present

## 2022-08-22 MED ORDER — LIDOCAINE HCL 1 % IJ SOLN
5.0000 mL | INTRAMUSCULAR | Status: AC | PRN
  Administered 2022-08-22: 5 mL

## 2022-08-22 MED ORDER — BUPIVACAINE HCL 0.5 % IJ SOLN
4.0000 mL | INTRAMUSCULAR | Status: AC | PRN
  Administered 2022-08-22: 4 mL via INTRA_ARTICULAR

## 2022-08-22 MED ORDER — METHYLPREDNISOLONE ACETATE 40 MG/ML IJ SUSP
40.0000 mg | INTRAMUSCULAR | Status: AC | PRN
  Administered 2022-08-22: 40 mg via INTRA_ARTICULAR

## 2022-08-22 NOTE — Progress Notes (Signed)
Office Visit Note   Patient: Barbara Patton           Date of Birth: 12-06-1940           MRN: [id number] Visit Date: 08/22/2022 Requested by: Seward Carol, MD [address] [place],  [place] [address] PCP: Seward Carol, MD  Subjective: Chief Complaint  Patient presents with   Neck - Pain, Follow-up    MRI Csp review   Right Leg - Pain    Pain from lateral hip to knee   Left Leg - Pain    Pain from lateral hip to knee    HPI: Barbara Patton is a 82 y.o. female who presents to the office reporting multiple joint complaints.  She complains of pain in the anterior aspect of her neck with looking down.  Localizes pain around the [place] joints.  She also complains of dorsal wrist pain in both wrists without any history of injury.  No numbness or tingling in the hands or wrist.  She has buttock pain as well as lateral bilateral hip pain similar to the trochanteric bursitis that she has experienced in the past.  No numbness or tingling in this region.  She states the pain travels down to the lateral knee in either leg.  She has groin pain in the left hip but no such groin pain in the right hip recently.  She had recent right hip injection into the hip joint that has apparently provided good relief of her groin pain.  She was able to bowl yesterday.                ROS: All systems reviewed are negative as they relate to the chief complaint within the history of present illness.  Patient denies fevers or chills.  Assessment & Plan: Visit Diagnoses:  1. Pain in left wrist   2. Pain in right wrist   3. Pain of right sternoclavicular joint   4. Sternoclavicular joint pain, right   5. Pain of left sternoclavicular joint   6. Greater trochanteric bursitis, right   7. Greater trochanteric bursitis, left     Plan: Patient is a 82 year old female who presents complaining of multiple joint complaints.  She has radiographs of the [place] joints taken today that demonstrates some arthritis  in either joint.  She will try topical Voltaren and CBD oil over her [place] joints and arthritic wrists to see if this will provide good relief for her.  Regarding her bilateral hip pain which seems to be the most troubling thing for her today, she would like to repeat bilateral hip trochanteric bursa injections which she has had in the past with intermittent good relief.  Under ultrasound guidance, these were administered with care taken to avoid injection into the gluteal tendons.  Tolerated procedure well and she will follow-up with the office in 2 months for clinical recheck, primarily regarding her wrist pain.  Follow-Up Instructions: No follow-ups on file.   Orders:  Orders Placed This Encounter  Procedures   XR Wrist Complete Left   XR Wrist Complete Right   XR [place] Joints   US Guided Needle Placement - No Linked Charges   No orders of the defined types were placed in this encounter.     Procedures: Large Joint Inj: bilateral greater trochanter on 08/22/2022 9:15 PM Indications: pain and diagnostic evaluation Details: 18 G 3.5 in needle, ultrasound-guided lateral approach  Arthrogram: No  Medications (Right): 5 mL lidocaine 1 %;  4 mL bupivacaine 0.5 %; 40 mg methylPREDNISolone acetate 40 MG/ML Medications (Left): 5 mL lidocaine 1 %; 4 mL bupivacaine 0.5 %; 40 mg methylPREDNISolone acetate 40 MG/ML Outcome: tolerated well, no immediate complications Procedure, treatment alternatives, risks and benefits explained, specific risks discussed. Consent was given by the patient. Immediately prior to procedure a time out was called to verify the correct patient, procedure, equipment, support staff and site/side marked as required. Patient was prepped and draped in the usual sterile fashion.       Clinical Data: No additional findings.  Objective: Vital Signs: There were no vitals taken for this visit.  Physical Exam:  Constitutional: Patient appears well-developed HEENT:  Head:  Normocephalic Eyes:EOM are normal Neck: Normal range of motion Cardiovascular: Normal rate Pulmonary/chest: Effort normal Neurologic: Patient is alert Skin: Skin is warm Psychiatric: Patient has normal mood and affect  Ortho Exam: Ortho exam demonstrates left hip with small amount of groin pain with passive hip motion.  She has moderate tenderness over the greater trochanter of the left hip.  No cellulitis or skin changes noted.  Right hip with no pain with hip range of motion.  She has moderate tenderness over the greater trochanter and the right hip.  She has no calf tenderness.  Negative Homans' sign.  She has negative straight leg raise bilaterally.  She has bilateral wrist with 2+ radial pulse of either extremity.  There is some swelling that is noted diffusely throughout either wrist with tenderness primarily over the anatomic snuffbox, scaphoid tubercle, 3-4 portal bilaterally.  No pain with thumb circumduction bilaterally.  No pain with thumb abduction bilaterally.  Does have pain with grip strength testing as well as FPL and EPL.  All these are intact however.  Tenderness over the [place] joints without any notable effusion.  Patient appears well.  Specialty Comments:  No specialty comments available.  Imaging: No results found.   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   Allergic rhinitis 07/26/2015   Anxiety 07/26/2015   Benign essential hypertension 07/26/2015   Dysfunction of left eustachian tube 07/26/2015    Esophageal reflux 07/26/2015   Fibromyalgia 07/26/2015   Pure hypercholesterolemia 07/26/2015   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 05/03/2014   Pes anserinus bursitis of left knee 12/04/2013   Trochanteric bursitis of left hip 08/07/2013   Leg swelling 12/06/2011   Glaucoma 11/02/2011   HLD (hyperlipidemia) 11/02/2011   HTN (hypertension) 11/02/2011   Hypothyroid 11/02/2011   OA (osteoarthritis) 11/02/2011   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 ([place]) 03/16/2011   Presence of artificial knee joint 03/16/2011   Past Medical History:  Diagnosis Date   Glaucoma    High cholesterol    Hypertension    Lupus ([place])    Thyroid disease     Family History  Problem Relation Age of Onset   Cancer Father    Cancer Brother    Cancer Brother    Stroke Brother  Past Surgical History:  Procedure Laterality Date   ABDOMINAL HYSTERECTOMY     CATARACT EXTRACTION, BILATERAL     TOTAL KNEE ARTHROPLASTY Bilateral    TUBAL LIGATION     Social History   Occupational History   Not on file  Tobacco Use   Smoking status: Former    Types: Cigarettes    Quit date: 1997    Years since quitting: 27.2   Smokeless tobacco: Never  Vaping Use   Vaping Use: Never used  Substance and Sexual Activity   Alcohol use: Yes    Alcohol/week: 3.0 standard drinks of alcohol    Types: 3 Glasses of wine per week    Comment: occ wine   Drug use: No   Sexual activity: Not on file

## 2022-08-28 ENCOUNTER — Telehealth: Payer: Self-pay | Admitting: Surgical

## 2022-08-28 NOTE — Telephone Encounter (Signed)
Patient states the injection didn't help and is wanting to speak to [facility].Marland Kitchen

## 2022-09-02 NOTE — Telephone Encounter (Signed)
Called and left VM

## 2022-09-13 DIAGNOSIS — R5383 Other fatigue: Secondary | ICD-10-CM | POA: Diagnosis not present

## 2022-09-13 DIAGNOSIS — M1991 Primary osteoarthritis, unspecified site: Secondary | ICD-10-CM | POA: Diagnosis not present

## 2022-09-13 DIAGNOSIS — M5136 Other intervertebral disc degeneration, lumbar region: Secondary | ICD-10-CM | POA: Diagnosis not present

## 2022-09-13 DIAGNOSIS — H2011 Chronic iridocyclitis, right eye: Secondary | ICD-10-CM | POA: Diagnosis not present

## 2022-09-13 DIAGNOSIS — E559 Vitamin D deficiency, unspecified: Secondary | ICD-10-CM | POA: Diagnosis not present

## 2022-09-13 DIAGNOSIS — R21 Rash and other nonspecific skin eruption: Secondary | ICD-10-CM | POA: Diagnosis not present

## 2022-09-13 DIAGNOSIS — M797 Fibromyalgia: Secondary | ICD-10-CM | POA: Diagnosis not present

## 2022-09-13 DIAGNOSIS — Z6829 Body mass index (BMI) 29.0-29.9, adult: Secondary | ICD-10-CM | POA: Diagnosis not present

## 2022-09-13 DIAGNOSIS — M329 Systemic lupus erythematosus, unspecified: Secondary | ICD-10-CM | POA: Diagnosis not present

## 2022-09-14 DIAGNOSIS — Z79899 Other long term (current) drug therapy: Secondary | ICD-10-CM | POA: Diagnosis not present

## 2022-09-14 DIAGNOSIS — H34812 Central retinal vein occlusion, left eye, with macular edema: Secondary | ICD-10-CM | POA: Diagnosis not present

## 2022-09-14 DIAGNOSIS — H43813 Vitreous degeneration, bilateral: Secondary | ICD-10-CM | POA: Diagnosis not present

## 2022-09-14 DIAGNOSIS — H35033 Hypertensive retinopathy, bilateral: Secondary | ICD-10-CM | POA: Diagnosis not present

## 2022-09-27 ENCOUNTER — Telehealth: Payer: Self-pay | Admitting: Physical Medicine and Rehabilitation

## 2022-09-27 NOTE — Telephone Encounter (Signed)
Patient need an injection and wants an appointment ( in back)

## 2022-09-27 NOTE — Telephone Encounter (Signed)
Patient is requesting an injection. Her appointment with Megan in 04/2022 stated if her symptoms return, she was going to obtain a MRI. Please advise

## 2022-09-29 ENCOUNTER — Other Ambulatory Visit: Payer: Self-pay | Admitting: Physical Medicine and Rehabilitation

## 2022-09-29 DIAGNOSIS — M5416 Radiculopathy, lumbar region: Secondary | ICD-10-CM

## 2022-09-29 NOTE — Telephone Encounter (Signed)
Patient would like to have updated MRI

## 2022-10-01 ENCOUNTER — Encounter (HOSPITAL_COMMUNITY): Payer: Self-pay

## 2022-10-01 ENCOUNTER — Emergency Department (HOSPITAL_COMMUNITY): Payer: Medicare PPO

## 2022-10-01 ENCOUNTER — Emergency Department (HOSPITAL_COMMUNITY)
Admission: EM | Admit: 2022-10-01 | Discharge: 2022-10-01 | Disposition: A | Discharge status: Home / Self Care | Payer: Medicare PPO | Attending: Emergency Medicine | Admitting: Emergency Medicine

## 2022-10-01 DIAGNOSIS — Z79899 Other long term (current) drug therapy: Secondary | ICD-10-CM | POA: Insufficient documentation

## 2022-10-01 DIAGNOSIS — S3992XA Unspecified injury of lower back, initial encounter: Secondary | ICD-10-CM | POA: Diagnosis present

## 2022-10-01 DIAGNOSIS — M545 Low back pain, unspecified: Secondary | ICD-10-CM | POA: Diagnosis not present

## 2022-10-01 DIAGNOSIS — S300XXA Contusion of lower back and pelvis, initial encounter: Secondary | ICD-10-CM | POA: Diagnosis not present

## 2022-10-01 DIAGNOSIS — Y9301 Activity, walking, marching and hiking: Secondary | ICD-10-CM | POA: Diagnosis not present

## 2022-10-01 DIAGNOSIS — W01198A Fall on same level from slipping, tripping and stumbling with subsequent striking against other object, initial encounter: Secondary | ICD-10-CM | POA: Diagnosis not present

## 2022-10-01 DIAGNOSIS — W19XXXA Unspecified fall, initial encounter: Secondary | ICD-10-CM

## 2022-10-01 DIAGNOSIS — Z96652 Presence of left artificial knee joint: Secondary | ICD-10-CM | POA: Insufficient documentation

## 2022-10-01 DIAGNOSIS — Y92009 Unspecified place in unspecified non-institutional (private) residence as the place of occurrence of the external cause: Secondary | ICD-10-CM | POA: Insufficient documentation

## 2022-10-01 DIAGNOSIS — I10 Essential (primary) hypertension: Secondary | ICD-10-CM | POA: Insufficient documentation

## 2022-10-01 DIAGNOSIS — E039 Hypothyroidism, unspecified: Secondary | ICD-10-CM | POA: Insufficient documentation

## 2022-10-01 DIAGNOSIS — M25552 Pain in left hip: Secondary | ICD-10-CM | POA: Diagnosis not present

## 2022-10-01 DIAGNOSIS — M546 Pain in thoracic spine: Secondary | ICD-10-CM | POA: Diagnosis not present

## 2022-10-01 MED ORDER — FENTANYL CITRATE PF 50 MCG/ML IJ SOSY
50.0000 ug | PREFILLED_SYRINGE | Freq: Once | INTRAMUSCULAR | Status: AC
  Administered 2022-10-01: 50 ug via INTRAMUSCULAR
  Filled 2022-10-01: qty 1

## 2022-10-01 MED ORDER — KETOROLAC TROMETHAMINE 60 MG/2ML IM SOLN
15.0000 mg | Freq: Once | INTRAMUSCULAR | Status: AC
  Administered 2022-10-01: 15 mg via INTRAMUSCULAR
  Filled 2022-10-01: qty 2

## 2022-10-01 MED ORDER — ACETAMINOPHEN 500 MG PO TABS
1000.0000 mg | ORAL_TABLET | Freq: Once | ORAL | Status: AC
  Administered 2022-10-01: 1000 mg via ORAL
  Filled 2022-10-01: qty 2

## 2022-10-01 NOTE — ED Provider Notes (Signed)
[place] EMERGENCY DEPARTMENT AT [facility] Provider Note  CSN: [id number] Arrival date & time: 10/01/22 4098  Chief Complaint(s) Fall  HPI Barbara Patton is a 82 y.o. female who presents to the emergency department after mechanical fall at home.  Patient reports that she bent down to pick up a wrapper to throw away in the trash when she lost her balance causing her to fall backwards onto her buttocks and back.  Patient denies any head trauma or loss of consciousness.  She is not anticoagulated.  This occurred just prior to arrival.  She states that she was able to get up and walk afterwards but the pain was significant prompting her visit to the emergency department.  She denied taking any pain medicine prior to this.  The history is provided by the patient.    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   Allergic rhinitis 07/26/2015   Anxiety 07/26/2015   Benign essential hypertension 07/26/2015   Dysfunction of left eustachian tube 07/26/2015   Esophageal reflux 07/26/2015   Fibromyalgia 07/26/2015   Pure hypercholesterolemia 07/26/2015   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 Authorizing Provider  acetaminophen (TYLENOL) 325 MG tablet Take 650 mg by mouth 2 (two) times daily as needed (pain).    [provider]  Cholecalciferol (VITAMIN D-3 PO) Take 1 tablet by mouth daily.    [provider]  diclofenac Sodium (VOLTAREN) 1 % GEL Add'l Sig Add'l Sig topical Add'l Sig 08/21/19   [provider]  DULoxetine (CYMBALTA) 30 MG capsule  11/03/20   [provider]  furosemide (LASIX) 40 MG tablet Take 40 mg by mouth daily. 12/22/19   [provider]  hydroxychloroquine (PLAQUENIL) 200 MG tablet Take 200 mg by mouth daily.  06/19/16   [provider]  hydrOXYzine (ATARAX/VISTARIL) 10 MG tablet Take 10 mg by mouth every 6 (six) hours as needed for itching or anxiety. 06/10/17   [provider]  levothyroxine (SYNTHROID) 100 MCG tablet Take 100 mcg by mouth daily. 11/11/19   [provider]  Multiple Vitamins-Minerals (MULTIVITAMIN PO) Take 1 tablet by mouth daily.     [provider]  mupirocin ointment (BACTROBAN) 2 % Apply 1 Application topically 2 (two) times daily. To affected area till better 07/30/22   Marlinda Mike,  Janace Aris, MD  omeprazole (PRILOSEC) 40 MG capsule Take 40 mg by mouth daily.  03/09/16   [provider]  propranolol (INDERAL) 20 MG tablet Take 1 tablet (20 mg total) by mouth 2 (two) times daily. 06/14/22   Tat, Octaviano Batty, DO  simvastatin (ZOCOR) 20 MG tablet Take 20 mg by mouth every evening.  11/06/13   [provider]  traMADol (ULTRAM) 50 MG tablet Take 50 mg by mouth 2 (two) times daily as needed (pain).  01/01/14   [provider]  estradiol (ESTRACE) 0.5 MG tablet  02/14/18 11/09/19  [provider]                                                                                                                                    Allergies Codeine, Penicillin g, and Penicillins  Review of Systems Review of Systems As noted in HPI  Physical Exam Vital Signs  I have reviewed the triage vital signs BP (!) 175/66 (BP Location: Right Arm)   Pulse (!) 52   Temp 98.3 F (36.8 C) (Oral)   Resp 18   SpO2 98%   Physical Exam Constitutional:      General: She is not in acute distress.    Appearance: She is well-developed. She is not diaphoretic.  HENT:     Head: Normocephalic and atraumatic.     Right Ear: External ear normal.     Left Ear: External ear normal.     Nose: Nose normal.  Eyes:     General: No scleral icterus.       Right eye: No discharge.        Left eye: No discharge.     Conjunctiva/sclera: Conjunctivae normal.     Pupils: Pupils are equal, round, and reactive to light.  Cardiovascular:     Rate and Rhythm: Normal rate and regular rhythm.     Pulses:          Radial pulses are 2+ on the right side and 2+ on the left side.       Dorsalis pedis pulses are 2+ on the right side and 2+ on the left side.     Heart sounds: Normal heart sounds. No murmur heard.    No friction rub. No gallop.  Pulmonary:     Effort: Pulmonary effort is normal. No respiratory distress.     Breath sounds: Normal breath sounds. No stridor. No wheezing.  Abdominal:     General: There is no distension.     Palpations: Abdomen is soft.     Tenderness: There is no abdominal tenderness.  Musculoskeletal:     Cervical back: Normal range of motion and neck supple. No bony tenderness.     Thoracic back: No bony tenderness.     Lumbar back: Tenderness present. No bony tenderness.       Back:     Right hip: Tenderness present.  Legs:     Comments: Clavicles stable. Chest  stable to AP/Lat compression. Pelvis stable to Lat compression. No obvious extremity deformity. No chest or abdominal wall contusion.  Skin:    General: Skin is warm and dry.     Findings: No erythema or rash.  Neurological:     Mental Status: She is alert and oriented to person, place, and time.     Comments: Moving all extremities     ED Results and Treatments Labs (all labs ordered are listed, but only abnormal results are displayed) Labs Reviewed - No data to display                                                                                                                       EKG  EKG Interpretation  Date/Time:    Ventricular Rate:    PR Interval:    QRS Duration:   QT Interval:    QTC Calculation:   R Axis:     Text Interpretation:         Radiology DG Thoracic Spine 2 View  Result Date: 10/01/2022 CLINICAL DATA:  Fall with back pain EXAM: THORACIC SPINE 2 VIEWS COMPARISON:  None Available. FINDINGS: No acute fracture or traumatic malalignment. Degenerative disc narrowing with endplate spurring in the mid and lower thoracic spine. Maintained posterior mediastinal fat planes. Mild thoracic levocurvature. IMPRESSION: Degenerative changes without acute or focal finding. Electronically Signed   By: Tiburcio Pea M.D.   On: 10/01/2022 06:41   DG Lumbar Spine Complete  Result Date: 10/01/2022 CLINICAL DATA:  Fall with back pain EXAM: LUMBAR SPINE - COMPLETE 4 VIEW COMPARISON:  07/31/2022 FINDINGS: Lumbar spine degeneration especially affecting facets with anterolisthesis at L3-4 and L4-5. Chronic superior endplate fractures at L1 and L3. Extensive atheromatous calcification of the aorta and iliacs. IMPRESSION: 1. No acute finding. 2. 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 Electronically Signed   By: Tiburcio Pea M.D.   On: 10/01/2022 06:41   DG HIP UNILAT W OR W/O PELVIS 2-3 VIEWS LEFT  Result Date:  10/01/2022 CLINICAL DATA:  Fall with pain. EXAM: DG HIP (WITH OR WITHOUT PELVIS) 2-3V LEFT COMPARISON:  08/12/2021 FINDINGS: No evidence for an acute fracture in the bony pelvis. SI joints and symphysis pubis unremarkable. Joint space in the right hip is relatively well preserved. There is loss of joint space in the left hip with degenerative spurring. No evidence for left femoral neck fracture. IMPRESSION: 1. No acute bony findings. 2. Degenerative changes in the left hip. Electronically Signed   By: Kennith Center M.D.   On: 10/01/2022 06:26    Medications Ordered in ED Medications  acetaminophen (TYLENOL) tablet 1,000 mg (1,000 mg Oral Given 10/01/22 0518)  fentaNYL (SUBLIMAZE) injection 50 mcg (50 mcg Intramuscular Given 10/01/22 0518)  ketorolac (TORADOL) injection 15 mg (15 mg Intramuscular Given 10/01/22 0658)  Procedures Procedures  (including critical care time)  Medical Decision Making / ED Course  Click here for ABCD2, HEART and other calculators  Medical Decision Making Amount and/or Complexity of Data Reviewed Radiology: ordered and independent interpretation performed. Decision-making details documented in ED Course.  Risk OTC drugs. Prescription drug management.    Mechanical fall resulting in hip and buttock and lower back pain. Plain films negative for any acute fracture or dislocation. Likely soft tissue contusion Provided with IM and oral pain medicine Recommended ice and rest.      Final Clinical Impression(s) / ED Diagnoses Final diagnoses:  Fall, initial encounter  Contusion of left buttock   The patient appears reasonably screened and/or stabilized for discharge and I doubt any other medical condition or other [facility] requiring further screening, evaluation, or treatment in the ED at this time. I have discussed the findings, Dx and  Tx plan with the patient/family who expressed understanding and agree(s) with the plan. Discharge instructions discussed at length. The patient/family was given strict return precautions who verbalized understanding of the instructions. No further questions at time of discharge.  Disposition: Discharge  Condition: Good  ED Discharge Orders     None       Follow Up: Renford Dills, MD [address] [place] [address] [phone]  Call  to schedule an appointment for close follow up           This chart was dictated using voice recognition software.  Despite best efforts to proofread,  errors can occur which can change the documentation meaning.    Nira Conn, MD 10/01/22 [phone]

## 2022-10-01 NOTE — ED Triage Notes (Signed)
Pt arrived POV for fall after waking up to go to the BR,  reports bend down to pick up a piece of paper and fell backwards losing her balance. Denies LOC, report left hip pain and back pain. Pt has hx of chronic back pain.

## 2022-10-01 NOTE — Discharge Instructions (Addendum)
For pain control you may take 1000 mg of Tylenol every 8 hours scheduled.  In addition you can take 600 mg of Motrin every 6 hours as needed for pain not controlled with the scheduled Tylenol.

## 2022-10-03 ENCOUNTER — Ambulatory Visit: Payer: Medicare PPO | Admitting: Podiatry

## 2022-10-03 ENCOUNTER — Encounter: Payer: Self-pay | Admitting: Physician Assistant

## 2022-10-03 ENCOUNTER — Other Ambulatory Visit (INDEPENDENT_AMBULATORY_CARE_PROVIDER_SITE_OTHER): Payer: Medicare PPO

## 2022-10-03 ENCOUNTER — Ambulatory Visit (INDEPENDENT_AMBULATORY_CARE_PROVIDER_SITE_OTHER): Payer: Medicare PPO | Admitting: Physician Assistant

## 2022-10-03 DIAGNOSIS — M25561 Pain in right knee: Secondary | ICD-10-CM

## 2022-10-03 DIAGNOSIS — G8929 Other chronic pain: Secondary | ICD-10-CM

## 2022-10-03 NOTE — Progress Notes (Signed)
Office Visit Note   Patient: Barbara Patton           Date of Birth: Aug 20, 1940           MRN: [id number] Visit Date: 10/03/2022              Requested by: Renford Dills, MD [address] [place],  [place] [address] PCP: Renford Dills, MD  Chief Complaint  Patient presents with   Left Hip - Pain      HPI: Madelline is a pleasant 82 year old woman who is a longtime patient of Dr. August Saucer and Franky Macho.  She also was seen by Dr. Alvester Morin.  Apparently she had 2 falls over the weekend.  The first fall she was seen and evaluated in the emergency room where x-rays of her pelvis as well as her lumbar and thoracic spine were taken and there were no noted acute fractures.  She said yesterday she fell forward off her commode hitting her head and her right knee.  She comes in today wanting to know why she keeps falling.  She has been in contact with Dr. Alvester Morin and they are reordering another MRI of her spine.  Assessment & Plan: Visit Diagnoses:  1. Chronic pain of right knee     Plan: X-rays of her right knee did not show any osseous injuries to the total joint.  She does have a small contusion on her head from her fall yesterday but denies any nausea vomiting headache.  Certainly if she had any this I would have her follow-up in the emergency room or see her primary care provider.  She has had multiple injections by [facility] before.  She is requesting an injection into her left trochanteric bursa today.  It is not even been 2 months since loop did this for her under ultrasound guidance and she did call and say it gave her no relief.  Because of this I have declined this.  She certainly can follow-up with [facility] and/or Dr. August Saucer.  She has multiple joint complaints which have been chronic and more exacerbated recently.  Certainly her falls which have been not completely mechanical may be explained by balance issues or further deterioration of her spine but she does have good strength today and no  neurologic deficits.  She does not recall any predrome to either of the falls and did not lose consciousness.  I did offer her some physical therapy to help with gait training and balance and fall prevention and she like to try this.  In the meantime she also has an MRI scheduled on her lumbar spine and she may follow this up with [facility] or Dr. Alvester Morin.  I also encouraged her to follow-up with her primary care doctor as she does have fibromyalgia which makes her pain more difficult to manage.  She currently takes tramadol but certainly another medication may be more helpful to her  Follow-Up Instructions: No follow-ups on file.   Ortho Exam  Patient is alert, oriented, no adenopathy, well-dressed, normal affect, normal respiratory effort. Examination she is ambulating with a cane.  She has some tenderness over posterior buttock and lateral hip.  On the left.  She has some pain with internal/external rotation of her hip.  She is status post bilateral total knee replacements.  The right knee has a small abrasion from falling on it yesterday but no effusion no redness no erythema.  Her strength is equal bilaterally with dorsiflexion plantarflexion extension flexion of her  legs and is 4 out of 5.  Sensation is at her baseline compartments are soft and nontender  Imaging: XR KNEE 3 VIEW RIGHT  Result Date: 10/03/2022 Three-view radiographs of her right knee were reviewed today she is status post total right knee arthroplasty components remain in stable position when compared to previous x-rays.  No evidence of any periprosthetic fracture  No images are attached to the encounter.  Labs: Lab Results  Component Value Date   REPTSTATUS 01/01/2020 FINAL 12/31/2019   CULT  12/31/2019    NO GROWTH Performed at [facility], [address], [place], [place] [address]    Cindie Crumbly KOSERI 12/21/2010     Lab Results  Component Value Date   ALBUMIN 4.2 12/03/2021   ALBUMIN 3.5 01/27/2014     No results found for: "MG" No results found for: "VD25OH"  No results found for: "PREALBUMIN"    Latest Ref Rng & Units 12/03/2021    5:08 PM 01/27/2014   10:14 AM 12/15/2010    9:40 AM  CBC EXTENDED  WBC 4.0 - 10.5 K/uL 3.4  4.0  4.5   RBC 3.87 - 5.11 MIL/uL 4.09  3.87  3.90   Hemoglobin 12.0 - 15.0 g/dL 60.4  54.0  98.1   HCT 36.0 - 46.0 % 42.0  37.7  36.4   Platelets 150 - 400 K/uL 203  197  212   NEUT# 1.7 - 7.7 K/uL 2.8     Lymph# 0.7 - 4.0 K/uL 0.5        There is no height or weight on file to calculate BMI.  Orders:  Orders Placed This Encounter  Procedures   XR KNEE 3 VIEW RIGHT   Ambulatory referral to Physical Therapy   No orders of the defined types were placed in this encounter.    Procedures: No procedures performed  Clinical Data: No additional findings.  ROS:  All other systems negative, except as noted in the HPI. Review of Systems  Objective: Vital Signs: There were no vitals taken for this visit.  Specialty Comments:  No specialty comments available.  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   Allergic rhinitis 07/26/2015   Anxiety 07/26/2015   Benign essential hypertension 07/26/2015   Dysfunction of left eustachian tube 07/26/2015   Esophageal reflux 07/26/2015   Fibromyalgia 07/26/2015   Pure hypercholesterolemia 07/26/2015   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 05/03/2014   Pes  anserinus bursitis of left knee 12/04/2013   Trochanteric bursitis of left hip 08/07/2013   Leg swelling 12/06/2011   Glaucoma 11/02/2011   HLD (hyperlipidemia) 11/02/2011   HTN (hypertension) 11/02/2011   Hypothyroid 11/02/2011   OA (osteoarthritis) 11/02/2011   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) 03/16/2011   Presence of artificial knee joint 03/16/2011   Past Medical History:  Diagnosis Date   Glaucoma    High cholesterol    Hypertension    Lupus (HCC)    Thyroid  disease     Family History  Problem Relation Age of Onset   Cancer Father    Cancer Brother    Cancer Brother    Stroke Brother     Past Surgical History:  Procedure Laterality Date   ABDOMINAL HYSTERECTOMY     CATARACT EXTRACTION, BILATERAL     TOTAL KNEE ARTHROPLASTY Bilateral    TUBAL LIGATION     Social History   Occupational History   Not on file  Tobacco Use   Smoking status: Former    Types: Cigarettes    Quit date: 1997    Years since quitting: 27.3   Smokeless tobacco: Never  Vaping Use   Vaping Use: Never used  Substance and Sexual Activity   Alcohol use: Yes    Alcohol/week: 3.0 standard drinks of alcohol    Types: 3 Glasses of wine per week    Comment: occ wine   Drug use: No   Sexual activity: Not on file

## 2022-10-05 DIAGNOSIS — S0990XA Unspecified injury of head, initial encounter: Secondary | ICD-10-CM | POA: Diagnosis not present

## 2022-10-05 DIAGNOSIS — M25562 Pain in left knee: Secondary | ICD-10-CM | POA: Diagnosis not present

## 2022-10-05 DIAGNOSIS — W19XXXA Unspecified fall, initial encounter: Secondary | ICD-10-CM | POA: Diagnosis not present

## 2022-10-05 DIAGNOSIS — R296 Repeated falls: Secondary | ICD-10-CM | POA: Diagnosis not present

## 2022-10-05 DIAGNOSIS — R413 Other amnesia: Secondary | ICD-10-CM | POA: Diagnosis not present

## 2022-10-10 DIAGNOSIS — Z9181 History of falling: Secondary | ICD-10-CM | POA: Diagnosis not present

## 2022-10-10 DIAGNOSIS — M329 Systemic lupus erythematosus, unspecified: Secondary | ICD-10-CM | POA: Diagnosis not present

## 2022-10-10 DIAGNOSIS — G8929 Other chronic pain: Secondary | ICD-10-CM | POA: Diagnosis not present

## 2022-10-10 DIAGNOSIS — R296 Repeated falls: Secondary | ICD-10-CM | POA: Diagnosis not present

## 2022-10-13 ENCOUNTER — Encounter: Payer: Self-pay | Admitting: Physical Therapy

## 2022-10-13 ENCOUNTER — Ambulatory Visit: Payer: Medicare PPO | Admitting: Podiatry

## 2022-10-13 ENCOUNTER — Ambulatory Visit (INDEPENDENT_AMBULATORY_CARE_PROVIDER_SITE_OTHER): Payer: Medicare PPO | Admitting: Physical Therapy

## 2022-10-13 ENCOUNTER — Other Ambulatory Visit: Payer: Self-pay

## 2022-10-13 DIAGNOSIS — R262 Difficulty in walking, not elsewhere classified: Secondary | ICD-10-CM | POA: Diagnosis not present

## 2022-10-13 DIAGNOSIS — M5459 Other low back pain: Secondary | ICD-10-CM

## 2022-10-13 DIAGNOSIS — M79604 Pain in right leg: Secondary | ICD-10-CM | POA: Diagnosis not present

## 2022-10-13 DIAGNOSIS — R293 Abnormal posture: Secondary | ICD-10-CM

## 2022-10-13 DIAGNOSIS — M6281 Muscle weakness (generalized): Secondary | ICD-10-CM | POA: Diagnosis not present

## 2022-10-13 DIAGNOSIS — R2681 Unsteadiness on feet: Secondary | ICD-10-CM

## 2022-10-13 NOTE — Therapy (Signed)
OUTPATIENT PHYSICAL THERAPY LOWER EXTREMITY EVALUATION   Patient Name: Barbara Patton MRN: [id number] DOB:03-22-1941, 82 y.o., female Today's Date: 10/13/2022  END OF SESSION:  PT End of Session - 10/13/22 1353     Visit Number 1    Number of Visits 13    Date for PT Re-Evaluation 11/24/22    Authorization Type [employer] MCR/GEHA    Authorization Time Period 10/13/22 to 11/24/22    Progress Note Due on Visit 10    PT Start Time 1302    PT Stop Time 1343    PT Time Calculation (min) 41 min    Activity Tolerance Patient tolerated treatment well    Behavior During Therapy [facility] for tasks assessed/performed             Past Medical History:  Diagnosis Date   Glaucoma    High cholesterol    Hypertension    Lupus (HCC)    Thyroid disease    Past Surgical History:  Procedure Laterality Date   ABDOMINAL HYSTERECTOMY     CATARACT EXTRACTION, BILATERAL     TOTAL KNEE ARTHROPLASTY Bilateral    TUBAL LIGATION     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   Referred otalgia of left ear 08/23/2015   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   Allergic rhinitis 07/26/2015   Anxiety 07/26/2015   Benign essential hypertension 07/26/2015   Dysfunction of left eustachian tube 07/26/2015   Esophageal reflux 07/26/2015   Fibromyalgia 07/26/2015   Pure hypercholesterolemia 07/26/2015   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 05/03/2014   Pes anserinus bursitis of left knee 12/04/2013   Trochanteric bursitis of left hip 08/07/2013   Leg  swelling 12/06/2011   Glaucoma 11/02/2011   HLD (hyperlipidemia) 11/02/2011   HTN (hypertension) 11/02/2011   Hypothyroid 11/02/2011   OA (osteoarthritis) 11/02/2011   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) 03/16/2011   Presence of artificial knee joint 03/16/2011    PCP: Renford Dills MD   REFERRING PROVIDER: Persons, [place], [place]  REFERRING DIAG:  [phone] (ICD-10-CM) - Chronic pain of right knee   Eval and treat low back hip pain. Gait training Balance Fall Prevention THERAPY DIAG:  Muscle weakness (generalized)  Abnormal posture  Difficulty in walking, not elsewhere classified  Pain in right leg  Unsteadiness on feet  Other low back pain  Rationale for Evaluation and Treatment: Rehabilitation  ONSET DATE: chronic "its been awhile"   SUBJECTIVE:   SUBJECTIVE STATEMENT:  Last time I saw Dr. Diamantina Providence assistant I was told that I can't get a shot because I had one recently. Dr. Alvester Morin told me I should go to PT. The lady I saw last time said "would you like some therapy" and I said oh maybe it will help. Main issue is back/thigh/hip on the R. I have fibromyalgia too and OA which causes pain. When I get ready to do something, I have to sit back down. Its hard to really stand but I've been bowling with no issues. [facility]  [facility] due to pain in R side.   PERTINENT HISTORY: Anemia, tremor, B sensorineural hearing loss, TMJ pain, HTN, fibromyalgia, HLD, TKR + revision, anxiety, trochanteric bursitis, glaucoma, hypothyroidism, OA PAIN:  Are you having pain? No at rest; at worst can get to 8-10/10 "but not ready go to to the ED"  PRECAUTIONS: None  WEIGHT BEARING RESTRICTIONS: No  FALLS:  Has patient fallen in last 6 months? Yes. Number of falls 2- first fall was at 2am, fell trying to pick a candy wrapper off the floor; second fall fell forward trying to get off of commode. FOF +   LIVING ENVIRONMENT: Lives with: lives  with their family Lives in: House/apartment Stairs: 1 STE, no steps inside  Has following equipment at home: Single point cane and Environmental consultant - 2 wheeled  OCCUPATION: retired   PLOF: Independent, Independent with basic ADLs, Independent with household mobility with device, Independent with gait, and Independent with transfers  PATIENT GOALS: "to help my back and R LE I don't know if PT will help or"   NEXT MD VISIT: None scheduled with referring   OBJECTIVE:   DIAGNOSTIC FINDINGS:   FINDINGS: No evidence for an acute fracture in the bony pelvis. SI joints and symphysis pubis unremarkable. Joint space in the right hip is relatively well preserved. There is loss of joint space in the left hip with degenerative spurring. No evidence for left femoral neck fracture.   IMPRESSION: 1. No acute bony findings. 2. Degenerative changes in the left hip.  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 Chronic superior endplate fractures at L1 and L3. Extensive atheromatous calcification of the aorta and iliacs.   IMPRESSION: 1. No acute finding. 2. 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  THORACIC SPINE 2 VIEWS   COMPARISON:  None Available.   FINDINGS: No acute fracture or traumatic malalignment. Degenerative disc narrowing with endplate spurring in the mid and lower thoracic spine. Maintained posterior mediastinal fat planes. Mild thoracic levocurvature.   IMPRESSION: Degenerative changes without acute or focal finding.  Three-view radiographs of her right knee were reviewed today she is status  post total right knee arthroplasty components remain in stable position  when compared to previous x-rays.  No evidence of any periprosthetic  fracture    PATIENT SURVEYS:  FOTO 49, predicted 53 in 13 visits   COGNITION: Overall cognitive status: Within functional limits for  tasks assessed     SENSATION: "My feet feel numb all the time but not my legs, like up under the bottoms"     POSTURE: rounded shoulders, forward head, decreased lumbar lordosis, increased thoracic kyphosis, and flexed trunk   MUSCLE LENGTH  Piriformis severe limitation B HS R moderate limitation, severe limitation L    LUMBAR ROM  Lumbar flexion severe limitation  Lumbar extension mild limitation  Lumbar lateral flexion moderate limitation  B Lumbar rotation moderate limitation B       LOWER EXTREMITY MMT:  MMT Right eval Left eval  Hip flexion 3 3-  Hip extension    Hip abduction 3- 3-  Hip adduction    Hip internal rotation    Hip external rotation    Knee flexion 4+ 4+  Knee extension 4+ 4+  Ankle dorsiflexion 5 5  Ankle plantarflexion    Ankle inversion    Ankle eversion     (Blank rows = not tested)    FUNCTIONAL TESTS:  5 times  sit to stand: 30 seconds no UEs from standard chair Timed up and go (TUG): 22.5 seconds SPC                     Gait speed 1.69ft/second with SPC   GAIT: Distance walked: 52ft Assistive device utilized: Single point cane Level of assistance: Modified independence Comments: flexed at hips, limited step lengths/stance times, slightly unsteady even with SPC    TODAY'S TREATMENT:                                                                                                                              DATE:   Eval  Objective measures, appropriate education, POC, water PT and benefits thereof    PATIENT EDUCATION:  Education details: exam findings, POC,  benefits of water therapy  Person educated: Patient Education method: Programmer, multimedia, Demonstration, and Handouts Education comprehension: verbalized understanding, returned demonstration, and needs further education  HOME EXERCISE PROGRAM:  Will give second session   ASSESSMENT:  CLINICAL IMPRESSION: Patient is a 82 y.o. F who was seen today for physical therapy  evaluation and treatment for low back pain, R LE pain, R knee pain, gait impairment, and balance impairment. Exam reveals significant functional muscle weakness especially proximally, poor balance and gait impairment, high fall risk, limited functional mobility, and generalized pain. Outcomes may be affected significantly by fibromyalgia as well as chronicity of all impairments.We will trial PT to include aquatic therapy and plan to extend POC if she makes significant progress with skilled therapy services.   OBJECTIVE IMPAIRMENTS: Abnormal gait, decreased activity tolerance, decreased balance, decreased knowledge of use of DME, decreased mobility, difficulty walking, decreased ROM, decreased strength, hypomobility, increased fascial restrictions, impaired flexibility, impaired sensation, postural dysfunction, obesity, and pain.   ACTIVITY LIMITATIONS: standing, stairs, transfers, bed mobility, and locomotion level  PARTICIPATION LIMITATIONS: shopping, community activity, and yard work  PERSONAL FACTORS: Age, Behavior pattern, Fitness, Past/current experiences, Social background, and Time since onset of injury/illness/exacerbation are also affecting patient's functional outcome.   REHAB POTENTIAL: Fair chronicity of impairments, fibromyalgia, increased pain with activity at eval   CLINICAL DECISION MAKING: Stable/uncomplicated  EVALUATION COMPLEXITY: Low   GOALS: Goals reviewed with patient? No  SHORT TERM GOALS: Target date: 11/03/22   Will be compliant with appropriate progressive HEP  Baseline: Goal status: INITIAL  2.  Pain to be no more than 7/10 at worst  Baseline:  Goal status: INITIAL  3.  Hamstring and piriformis flexibility to have improved by 50% to assist in improving mobility and reducing pain  Baseline:  Goal status: INITIAL  4.  Lumbar AROM to have improved by 50% all directions in order to assist in improving mobility/reducing pain  Baseline:  Goal status:  INITIAL   LONG TERM GOALS: Target date: 11/24/22    MMT to have improved by 1 grade in all weak groups  Baseline:  Goal status: INITIAL  2.  Will complete 5x STS  test in 15 seconds or less no UEs  Baseline:  Goal status: INITIAL  3.  Will complete TUG test in 13 seconds or less with LRAD to show improved functional balance  Baseline:  Goal status: INITIAL  4.  Pain to be no more than 5/10 at worst  Baseline:  Goal status: INITIAL  5.  FOTO score to improved to at least predicted value  Baseline:  Goal status: INITIAL   PLAN:  PT FREQUENCY: 2x/week  PT DURATION: 6 weeks  PLANNED INTERVENTIONS: Therapeutic exercises, Therapeutic activity, Neuromuscular re-education, Gait training, Self Care, Aquatic Therapy, Manual therapy, and Re-evaluation  PLAN FOR NEXT SESSION: split between water and land therapies; progressive movement as tolerated for lumbar region/hips, balance training, strength   Nedra Hai PT DPT PN2    Referring diagnosis?  M25.561,G89.29 (ICD-10-CM) - Chronic pain of right knee   Treatment diagnosis? (if different than referring diagnosis)   Muscle weakness (generalized) M62.81  Abnormal posture R29.3  Difficulty in walking, not elsewhere classified R26.2  Pain in right leg M79.604  Unsteadiness on feet R26.81  Other low back pain M54.59  What was this (referring dx) caused by? []  Surgery [x]  Fall [x]  Ongoing issue []  Arthritis []  Other: ____________  Laterality: [x]  Rt []  Lt []  Both  Check all possible CPT codes:  *CHOOSE 10 OR LESS*    []  97110 (Therapeutic Exercise)  []  92507 (SLP Treatment)  []  97112 (Neuro Re-ed)   []  92526 (Swallowing Treatment)   []  97116 (Gait Training)   []  29562 (Cognitive Training, 1st 15 minutes) []  97140 (Manual Therapy)   []  97130 (Cognitive Training, each add'l 15 minutes)  []  97164 (Re-evaluation)                              []  Other, List CPT Code ____________  []  97530 (Therapeutic  Activities)     []  97535 (Self Care)   [x]  All codes above (97110 - 97535)  []  97012 (Mechanical Traction)  []  97014 (E-stim Unattended)  []  97032 (E-stim manual)  []  97033 (Ionto)  []  97035 (Ultrasound) []  97750 (Physical Performance Training) [x]  [id number] (Aquatic Therapy) []  97016 (Vasopneumatic Device) []  [id number] (Paraffin) []  97034 (Contrast Bath) []  97597 (Wound Care 1st 20 sq cm) []  97598 (Wound Care each add'l 20 sq cm) []  97760 (Orthotic Fabrication, Fitting, Training Initial) []  [id number] (Prosthetic Management and Training Initial) []  [id number] (Orthotic or Prosthetic Training/ Modification Subsequent)

## 2022-10-19 ENCOUNTER — Ambulatory Visit
Admission: RE | Admit: 2022-10-19 | Discharge: 2022-10-19 | Disposition: A | Discharge status: Home / Self Care | Payer: Medicare PPO | Source: Ambulatory Visit | Attending: Physical Medicine and Rehabilitation | Admitting: Physical Medicine and Rehabilitation

## 2022-10-19 DIAGNOSIS — M545 Low back pain, unspecified: Secondary | ICD-10-CM | POA: Diagnosis not present

## 2022-10-19 DIAGNOSIS — M5416 Radiculopathy, lumbar region: Secondary | ICD-10-CM | POA: Diagnosis not present

## 2022-10-19 DIAGNOSIS — M48061 Spinal stenosis, lumbar region without neurogenic claudication: Secondary | ICD-10-CM | POA: Diagnosis not present

## 2022-10-20 ENCOUNTER — Ambulatory Visit: Payer: Medicare PPO | Admitting: Physical Therapy

## 2022-10-27 ENCOUNTER — Encounter: Payer: Self-pay | Admitting: Physical Therapy

## 2022-10-27 ENCOUNTER — Ambulatory Visit (INDEPENDENT_AMBULATORY_CARE_PROVIDER_SITE_OTHER): Payer: Medicare PPO | Admitting: Physical Therapy

## 2022-10-27 DIAGNOSIS — R293 Abnormal posture: Secondary | ICD-10-CM | POA: Diagnosis not present

## 2022-10-27 DIAGNOSIS — M6281 Muscle weakness (generalized): Secondary | ICD-10-CM

## 2022-10-27 DIAGNOSIS — M79604 Pain in right leg: Secondary | ICD-10-CM

## 2022-10-27 DIAGNOSIS — M5459 Other low back pain: Secondary | ICD-10-CM | POA: Diagnosis not present

## 2022-10-27 DIAGNOSIS — R2681 Unsteadiness on feet: Secondary | ICD-10-CM

## 2022-10-27 DIAGNOSIS — R262 Difficulty in walking, not elsewhere classified: Secondary | ICD-10-CM | POA: Diagnosis not present

## 2022-10-27 NOTE — Therapy (Signed)
OUTPATIENT PHYSICAL THERAPY LOWER EXTREMITY TREATMENT   Patient Name: Barbara Patton MRN: [id number] DOB:April 16, 1941, 82 y.o., female Today's Date: 10/27/2022  END OF SESSION:  PT End of Session - 10/27/22 0941     Visit Number 2    Number of Visits 13    Date for PT Re-Evaluation 11/24/22    Authorization Type [employer] MCR/GEHA    Authorization Time Period 10/13/22 to 11/24/22    Progress Note Due on Visit 10    PT Start Time 0931    PT Stop Time 1011    PT Time Calculation (min) 40 min    Activity Tolerance Patient tolerated treatment well    Behavior During Therapy [facility] for tasks assessed/performed              Past Medical History:  Diagnosis Date   Glaucoma    High cholesterol    Hypertension    Lupus (HCC)    Thyroid disease    Past Surgical History:  Procedure Laterality Date   ABDOMINAL HYSTERECTOMY     CATARACT EXTRACTION, BILATERAL     TOTAL KNEE ARTHROPLASTY Bilateral    TUBAL LIGATION     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   Referred otalgia of left ear 08/23/2015   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   Allergic rhinitis 07/26/2015   Anxiety 07/26/2015   Benign essential hypertension 07/26/2015   Dysfunction of left eustachian tube 07/26/2015   Esophageal reflux 07/26/2015   Fibromyalgia 07/26/2015   Pure hypercholesterolemia 07/26/2015   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 05/03/2014   Pes anserinus bursitis of left knee 12/04/2013   Trochanteric bursitis of left hip 08/07/2013   Leg  swelling 12/06/2011   Glaucoma 11/02/2011   HLD (hyperlipidemia) 11/02/2011   HTN (hypertension) 11/02/2011   Hypothyroid 11/02/2011   OA (osteoarthritis) 11/02/2011   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 Gait training Balance Fall Prevention THERAPY DIAG:  Muscle weakness (generalized)  Abnormal posture  Difficulty in walking, not elsewhere classified  Pain in right leg  Unsteadiness on feet  Other low back pain  Rationale for Evaluation and Treatment: Rehabilitation  ONSET DATE: chronic "its been awhile"   SUBJECTIVE:   SUBJECTIVE STATEMENT:  My right leg hurts, my back doesn't bother me that much but my right hip and leg bother me a lot like something crawling. Missed water therapy because my keys were locked in my car and my daughter parked behind me. Nothing else new[facility], it went OK.   PERTINENT HISTORY: Anemia, tremor, B sensorineural hearing loss, TMJ pain, HTN, fibromyalgia, HLD, TKR + revision, anxiety, trochanteric bursitis, glaucoma, hypothyroidism, OA PAIN:  Are you having pain? Yes: NPRS scale: 6/10 Pain location: right leg  Pain description: "  I don't know I feel like a nerve or whatever"  Aggravating factors: nothing  Relieving factors: nothing   PRECAUTIONS: Back due to acute/subacute compression fractures found on MRI 10/19/22  WEIGHT BEARING RESTRICTIONS: No  FALLS:  Has patient fallen in last 6 months? Yes. Number of falls 2- first fall was at 2am, fell trying to pick a candy wrapper off the floor; second fall fell forward trying to get off of commode. FOF +   LIVING ENVIRONMENT: Lives with: lives with their family Lives in: House/apartment Stairs: 1  STE, no steps inside  Has following equipment at home: Single point cane and Environmental consultant - 2 wheeled  OCCUPATION: retired   PLOF: Independent, Independent with basic ADLs, Independent with household mobility with device, Independent with gait, and Independent with transfers  PATIENT GOALS: "to help my back and R LE I don't know if PT will help or"   NEXT MD VISIT: None scheduled with referring   OBJECTIVE:   DIAGNOSTIC FINDINGS:   FINDINGS: No evidence for an acute fracture in the bony pelvis. SI joints and symphysis pubis unremarkable. Joint space in the right hip is relatively well preserved. There is loss of joint space in the left hip with degenerative spurring. No evidence for left femoral neck fracture.   IMPRESSION: 1. No acute bony findings. 2. Degenerative changes in the left hip.  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 Chronic superior endplate fractures at L1 and L3. Extensive atheromatous calcification of the aorta and iliacs.   IMPRESSION: 1. No acute finding. 2. 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  THORACIC SPINE 2 VIEWS   COMPARISON:  None Available.   FINDINGS: No acute fracture or traumatic malalignment. Degenerative disc narrowing with endplate spurring in the mid and lower thoracic spine. Maintained posterior mediastinal fat planes. Mild thoracic levocurvature.   IMPRESSION: Degenerative changes without acute or focal finding.  Three-view radiographs of her right knee were reviewed today she is status  post total right knee arthroplasty components remain in stable position  when compared to previous x-rays.  No evidence of any periprosthetic  fracture    EXAM: MRI LUMBAR SPINE WITHOUT CONTRAST   TECHNIQUE: Multiplanar, multisequence MR imaging of the lumbar spine was performed. No intravenous contrast was administered.    COMPARISON:  Radiographs 10/01/2022 and 07/31/2022.  MRI 12/04/2019.   FINDINGS: Segmentation: Conventional anatomy assumed, with the last open disc space designated L5-S1.Concordant with prior imaging.   Alignment: Grade 1 anterolisthesis at L3-4 and L4-5, similar to previous MRI.   Vertebrae: There are mild superior endplate compression fractures at the L3 and L4 levels, resulting in approximately 10% loss of vertebral body height. There is associated bone marrow edema, but no osseous retropulsion or definite involvement of the posterior elements. These fractures are not clearly seen on recent or prior radiographs. No other acute osseous findings. The visualized sacroiliac joints appear unremarkable.   Conus medullaris: Extends to the L1-2 level and appears normal.   Paraspinal and other soft tissues: No acute paraspinal findings. The urinary bladder is noted to be distended and moderately trabeculated. 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   Disc levels:   Sagittal images demonstrate no significant disc space findings within the visualized lower thoracic spine. There is mild disc bulging at T11-12 and T12-L1.   L1-2: Preserved disc height. Mild facet hypertrophy. No spinal stenosis or nerve root encroachment.   L2-3: Preserved disc height with  mild disc bulging and mild-to-moderate facet hypertrophy. No spinal stenosis or nerve root encroachment.   L3-4: Moderate to severe facet hypertrophy accounts for the grade 1 anterolisthesis and contributes to annular disc bulging and uncovering. There is resulting moderate multifactorial spinal stenosis with mild lateral recess and minimal foraminal narrowing bilaterally. The spinal stenosis has progressed from the previous MRI   L4-5: Moderate to severe facet hypertrophy accounts for the grade 1 anterolisthesis and contributes to annular disc bulging and uncovering. There is only resulting borderline spinal  stenosis at this level with minimal left lateral recess and left foraminal narrowing. No nerve root encroachment. No significant changes are seen at this level.   L5-S1: Chronic disc bulging with endplate osteophytes asymmetric to the left. Mild facet hypertrophy. No spinal stenosis or nerve root encroachment.   IMPRESSION: 1. Acute/subacute mild superior endplate compression fractures at L3 and L4 with associated bone marrow edema, but no osseous retropulsion. 2. 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 3. Borderline spinal stenosis at L4-5 secondary to facet hypertrophy and a grade 1 anterolisthesis. 4. Distended urinary bladder with trabeculation, suggesting chronic bladder outlet obstruction.      PATIENT SURVEYS:  FOTO 49, predicted 53 in 13 visits   COGNITION: Overall cognitive status: Within functional limits for tasks assessed     SENSATION: "My feet feel numb all the time but not my legs, like up under the bottoms"     POSTURE: rounded shoulders, forward head, decreased lumbar lordosis, increased thoracic kyphosis, and flexed trunk   MUSCLE LENGTH  Piriformis severe limitation B HS R moderate limitation, severe limitation L    LUMBAR ROM  Lumbar flexion severe limitation  Lumbar extension mild limitation  Lumbar lateral flexion moderate limitation  B Lumbar rotation moderate limitation B       LOWER EXTREMITY MMT:  MMT Right eval Left eval  Hip flexion 3 3-  Hip extension    Hip abduction 3- 3-  Hip adduction    Hip internal rotation    Hip external rotation    Knee flexion 4+ 4+  Knee extension 4+ 4+  Ankle dorsiflexion 5 5  Ankle plantarflexion    Ankle inversion    Ankle eversion     (Blank rows = not tested)    FUNCTIONAL TESTS:  5 times sit to stand: 30 seconds no UEs from standard chair Timed up and go (TUG): 22.5 seconds SPC                     Gait speed 1.19ft/second with  SPC   GAIT: Distance walked: 54ft Assistive device utilized: Single point cane Level of assistance: Modified independence Comments: flexed at hips, limited step lengths/stance times, slightly unsteady even with SPC    TODAY'S TREATMENT:  DATE:    10/27/22  TherEX  SciFit L4 x6 minutes (Nustep not available) Practiced safe bed mobility in context of back precautions  SKTC 5x5 seconds B Figure 4 stretch 3x30 seconds B HS stretch supine 3x30 seconds B Bridges x10 partial range  PPT x10 with 3 second holds Hip hikes heavy cues for form x10 B Standing hip ABD x10 0# B      Eval  Objective measures, appropriate education, POC, water PT and benefits thereof    PATIENT EDUCATION:  Education details: exam findings, POC,  benefits of water therapy  Person educated: Patient Education method: Explanation, Demonstration, and Handouts Education comprehension: verbalized understanding, returned demonstration, and needs further education  HOME EXERCISE PROGRAM:  Access Code: [email or web address] URL: [email or web address] Date: 10/27/2022 Prepared by: Nedra Hai  Exercises - Supine Bridge  - 1 x daily - 7 x weekly - 1 sets - 10 reps - 2 hold - Supine Posterior Pelvic Tilt  - 1 x daily - 7 x weekly - 1 sets - 10 reps - 3 hold - Supine Figure 4 Piriformis Stretch  - 1 x daily - 7 x weekly - 1 sets - 3 reps - 30 hold - Hooklying Hamstring Stretch with Strap  - 1 x daily - 7 x weekly - 1 sets - 3 reps - 30 hold  ASSESSMENT:  CLINICAL IMPRESSION:  Barbara Patton arrives today doing OK. Unfortunately she missed her water therapy last appointment, I continue to feel that this will be really beneficial for her. Worked on general progressive mobility as tolerated today, also assigned initial HEP. Needed a lot of cues for good exercise performance.  Of note MRI  from 10/19/22 did show acute/subacute compression fractures in lumbar spine, precautions updated. Will continue efforts, encouraged water therapy and pool exercise on her own.    OBJECTIVE IMPAIRMENTS: Abnormal gait, decreased activity tolerance, decreased balance, decreased knowledge of use of DME, decreased mobility, difficulty walking, decreased ROM, decreased strength, hypomobility, increased fascial restrictions, impaired flexibility, impaired sensation, postural dysfunction, obesity, and pain.   ACTIVITY LIMITATIONS: standing, stairs, transfers, bed mobility, and locomotion level  PARTICIPATION LIMITATIONS: shopping, community activity, and yard work  PERSONAL FACTORS: Age, Behavior pattern, Fitness, Past/current experiences, Social background, and Time since onset of injury/illness/exacerbation are also affecting patient's functional outcome.   REHAB POTENTIAL: Fair chronicity of impairments, fibromyalgia, increased pain with activity at eval   CLINICAL DECISION MAKING: Stable/uncomplicated  EVALUATION COMPLEXITY: Low   GOALS: Goals reviewed with patient? No  SHORT TERM GOALS: Target date: 11/03/22   Will be compliant with appropriate progressive HEP  Baseline: Goal status: INITIAL  2.  Pain to be no more than 7/10 at worst  Baseline:  Goal status: INITIAL  3.  Hamstring and piriformis flexibility to have improved by 50% to assist in improving mobility and reducing pain  Baseline:  Goal status: INITIAL  4.  Lumbar AROM to have improved by 50% all directions in order to assist in improving mobility/reducing pain  Baseline:  Goal status: INITIAL   LONG TERM GOALS: Target date: 11/24/22    MMT to have improved by 1 grade in all weak groups  Baseline:  Goal status: INITIAL  2.  Will complete 5x STS test in 15 seconds or less no UEs  Baseline:  Goal status: INITIAL  3.  Will complete TUG test in 13 seconds or less with LRAD to show improved functional balance   Baseline:  Goal status: INITIAL  4.  Pain to be no more  than 5/10 at worst  Baseline:  Goal status: INITIAL  5.  FOTO score to improved to at least predicted value  Baseline:  Goal status: INITIAL   PLAN:  PT FREQUENCY: 2x/week  PT DURATION: 6 weeks  PLANNED INTERVENTIONS: Therapeutic exercises, Therapeutic activity, Neuromuscular re-education, Gait training, Self Care, Aquatic Therapy, Manual therapy, and Re-evaluation  PLAN FOR NEXT SESSION: split between water and land therapies; progressive movement as tolerated for lumbar region/hips in context of acute/subacute  Lx compression fractures, balance training, strength.   Nedra Hai PT DPT PN2    Referring diagnosis?  M25.561,G89.29 (ICD-10-CM) - Chronic pain of right knee   Treatment diagnosis? (if different than referring diagnosis)   Muscle weakness (generalized) M62.81  Abnormal posture R29.3  Difficulty in walking, not elsewhere classified R26.2  Pain in right leg M79.604  Unsteadiness on feet R26.81  Other low back pain M54.59  What was this (referring dx) caused by? []  Surgery [x]  Fall [x]  Ongoing issue []  Arthritis []  Other: ____________  Laterality: [x]  Rt []  Lt []  Both  Check all possible CPT codes:  *CHOOSE 10 OR LESS*    []  97110 (Therapeutic Exercise)  []  29528 (SLP Treatment)  []  97112 (Neuro Re-ed)   []  92526 (Swallowing Treatment)   []  97116 (Gait Training)   []  [id number] (Cognitive Training, 1st 15 minutes) []  97140 (Manual Therapy)   []  97130 (Cognitive Training, each add'l 15 minutes)  []  97164 (Re-evaluation)                              []  Other, List CPT Code ____________  []  97530 (Therapeutic Activities)     []  97535 (Self Care)   [x]  All codes above (97110 - 97535)  []  97012 (Mechanical Traction)  []  97014 (E-stim Unattended)  []  97032 (E-stim manual)  []  97033 (Ionto)  []  97035 (Ultrasound) []  97750 (Physical Performance Training) [x]  [id number] (Aquatic Therapy) []  97016  (Vasopneumatic Device) []  [id number] (Paraffin) []  97034 (Contrast Bath) []  97597 (Wound Care 1st 20 sq cm) []  97598 (Wound Care each add'l 20 sq cm) []  97760 (Orthotic Fabrication, Fitting, Training Initial) []  [id number] (Prosthetic Management and Training Initial) []  [id number] (Orthotic or Prosthetic Training/ Modification Subsequent)

## 2022-10-31 ENCOUNTER — Other Ambulatory Visit: Payer: Self-pay | Admitting: Physical Medicine and Rehabilitation

## 2022-10-31 DIAGNOSIS — M48062 Spinal stenosis, lumbar region with neurogenic claudication: Secondary | ICD-10-CM

## 2022-10-31 DIAGNOSIS — M5416 Radiculopathy, lumbar region: Secondary | ICD-10-CM

## 2022-10-31 MED ORDER — DIAZEPAM 5 MG PO TABS: ORAL_TABLET | ORAL | 0 refills | Status: DC

## 2022-10-31 NOTE — Progress Notes (Signed)
Spoke with patient this morning regarding recent lumbar MRI imaging. Progressed moderate spinal canal stenosis at L3-L4 compared to prior imaging from . I am placing order for bilateral L3 transforaminal ESI. Also prescribed pre-procedure Valium. Patient has no questions and would like to proceed with injection.

## 2022-11-01 ENCOUNTER — Ambulatory Visit (INDEPENDENT_AMBULATORY_CARE_PROVIDER_SITE_OTHER): Payer: Medicare PPO | Admitting: Physical Therapy

## 2022-11-01 ENCOUNTER — Encounter: Payer: Self-pay | Admitting: Physical Therapy

## 2022-11-01 DIAGNOSIS — R262 Difficulty in walking, not elsewhere classified: Secondary | ICD-10-CM

## 2022-11-01 DIAGNOSIS — R293 Abnormal posture: Secondary | ICD-10-CM

## 2022-11-01 DIAGNOSIS — M79604 Pain in right leg: Secondary | ICD-10-CM | POA: Diagnosis not present

## 2022-11-01 DIAGNOSIS — M6281 Muscle weakness (generalized): Secondary | ICD-10-CM | POA: Diagnosis not present

## 2022-11-01 DIAGNOSIS — R2681 Unsteadiness on feet: Secondary | ICD-10-CM

## 2022-11-01 DIAGNOSIS — M5459 Other low back pain: Secondary | ICD-10-CM

## 2022-11-01 NOTE — Therapy (Signed)
OUTPATIENT PHYSICAL THERAPY LOWER EXTREMITY TREATMENT   Patient Name: Barbara Patton MRN: [id number] DOB:02/23/1941, 82 y.o., female Today's Date: 11/01/2022  END OF SESSION:  PT End of Session - 11/01/22 0941     Visit Number 3    Number of Visits 13    Date for PT Re-Evaluation 11/24/22    Authorization Type [employer] MCR/GEHA    Authorization Time Period 10/13/22 to 11/24/22    Progress Note Due on Visit 10    PT Start Time 0932    PT Stop Time 1010    PT Time Calculation (min) 38 min    Activity Tolerance Patient tolerated treatment well    Behavior During Therapy [facility] for tasks assessed/performed               Past Medical History:  Diagnosis Date   Glaucoma    High cholesterol    Hypertension    Lupus (HCC)    Thyroid disease    Past Surgical History:  Procedure Laterality Date   ABDOMINAL HYSTERECTOMY     CATARACT EXTRACTION, BILATERAL     TOTAL KNEE ARTHROPLASTY Bilateral    TUBAL LIGATION     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   Referred otalgia of left ear 08/23/2015   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   Allergic rhinitis 07/26/2015   Anxiety 07/26/2015   Benign essential hypertension 07/26/2015   Dysfunction of left eustachian tube 07/26/2015   Esophageal reflux 07/26/2015   Fibromyalgia 07/26/2015   Pure hypercholesterolemia 07/26/2015   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 05/03/2014   Pes anserinus bursitis of left knee 12/04/2013   Trochanteric bursitis of left hip 08/07/2013   Leg  swelling 12/06/2011   Glaucoma 11/02/2011   HLD (hyperlipidemia) 11/02/2011   HTN (hypertension) 11/02/2011   Hypothyroid 11/02/2011   OA (osteoarthritis) 11/02/2011   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 Gait training Balance Fall Prevention THERAPY DIAG:  Muscle weakness (generalized)  Abnormal posture  Difficulty in walking, not elsewhere classified  Pain in right leg  Unsteadiness on feet  Other low back pain  Rationale for Evaluation and Treatment: Rehabilitation  ONSET DATE: chronic "its been awhile"   SUBJECTIVE:   SUBJECTIVE STATEMENT:  I'm so-so this morning, my right leg still hurts and my back hurts and I had to put CBD cream on this morning. Felt ok after last time. [facility] the thing that goes like this (shows hand movements), I rode the bike and I did the back and ab machines too.   PERTINENT HISTORY: Anemia, tremor, B sensorineural hearing loss, TMJ pain, HTN, fibromyalgia, HLD, TKR + revision, anxiety, trochanteric bursitis, glaucoma, hypothyroidism, OA PAIN:  Are you having pain? Yes: NPRS scale: 8/10 Pain location: right hip and leg  Pain description: "I don't know how to describe it except for it hurts"  Aggravating factors: movement in general  Relieving factors: CBD cream  PRECAUTIONS: Back due to acute/subacute compression fractures found on MRI 10/19/22  WEIGHT BEARING RESTRICTIONS: No  FALLS:  Has patient fallen in last 6 months? Yes. Number of falls 2- first fall was at 2am, fell trying to pick a candy wrapper off the floor; second fall fell forward trying to get off of commode. FOF +   LIVING ENVIRONMENT: Lives with: lives with their family Lives in:  House/apartment Stairs: 1 STE, no steps inside  Has following equipment at home: Single point cane and Environmental consultant - 2 wheeled  OCCUPATION: retired   PLOF: Independent, Independent with basic ADLs, Independent with household mobility with device, Independent with gait, and Independent with transfers  PATIENT GOALS: "to help my back and R LE I don't know if PT will help or"   NEXT MD VISIT: None scheduled with referring   OBJECTIVE:   DIAGNOSTIC FINDINGS:   FINDINGS: No evidence for an acute fracture in the bony pelvis. SI joints and symphysis pubis unremarkable. Joint space in the right hip is relatively well preserved. There is loss of joint space in the left hip with degenerative spurring. No evidence for left femoral neck fracture.   IMPRESSION: 1. No acute bony findings. 2. Degenerative changes in the left hip.  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 Chronic superior endplate fractures at L1 and L3. Extensive atheromatous calcification of the aorta and iliacs.   IMPRESSION: 1. No acute finding. 2. 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  THORACIC SPINE 2 VIEWS   COMPARISON:  None Available.   FINDINGS: No acute fracture or traumatic malalignment. Degenerative disc narrowing with endplate spurring in the mid and lower thoracic spine. Maintained posterior mediastinal fat planes. Mild thoracic levocurvature.   IMPRESSION: Degenerative changes without acute or focal finding.  Three-view radiographs of her right knee were reviewed today she is status  post total right knee arthroplasty components remain in stable position  when compared to previous x-rays.  No evidence of any periprosthetic  fracture    EXAM: MRI LUMBAR SPINE WITHOUT CONTRAST   TECHNIQUE: Multiplanar, multisequence MR imaging of the lumbar spine was performed. No intravenous  contrast was administered.   COMPARISON:  Radiographs 10/01/2022 and 07/31/2022.  MRI 12/04/2019.   FINDINGS: Segmentation: Conventional anatomy assumed, with the last open disc space designated L5-S1.Concordant with prior imaging.   Alignment: Grade 1 anterolisthesis at L3-4 and L4-5, similar to previous MRI.   Vertebrae: There are mild superior endplate compression fractures at the L3 and L4 levels, resulting in approximately 10% loss of vertebral body height. There is associated bone marrow edema, but no osseous retropulsion or definite involvement of the posterior elements. These fractures are not clearly seen on recent or prior radiographs. No other acute osseous findings. The visualized sacroiliac joints appear unremarkable.   Conus medullaris: Extends to the L1-2 level and appears normal.   Paraspinal and other soft tissues: No acute paraspinal findings. The urinary bladder is noted to be distended and moderately trabeculated. 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   Disc levels:   Sagittal images demonstrate no significant disc space findings within the visualized lower thoracic spine. There is mild disc bulging at T11-12 and T12-L1.   L1-2: Preserved disc height. Mild facet hypertrophy. No spinal stenosis or nerve root encroachment.  L2-3: Preserved disc height with mild disc bulging and mild-to-moderate facet hypertrophy. No spinal stenosis or nerve root encroachment.   L3-4: Moderate to severe facet hypertrophy accounts for the grade 1 anterolisthesis and contributes to annular disc bulging and uncovering. There is resulting moderate multifactorial spinal stenosis with mild lateral recess and minimal foraminal narrowing bilaterally. The spinal stenosis has progressed from the previous MRI   L4-5: Moderate to severe facet hypertrophy accounts for the grade 1 anterolisthesis and contributes to annular disc bulging and uncovering. There is only  resulting borderline spinal stenosis at this level with minimal left lateral recess and left foraminal narrowing. No nerve root encroachment. No significant changes are seen at this level.   L5-S1: Chronic disc bulging with endplate osteophytes asymmetric to the left. Mild facet hypertrophy. No spinal stenosis or nerve root encroachment.   IMPRESSION: 1. Acute/subacute mild superior endplate compression fractures at L3 and L4 with associated bone marrow edema, but no osseous retropulsion. 2. 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 3. Borderline spinal stenosis at L4-5 secondary to facet hypertrophy and a grade 1 anterolisthesis. 4. Distended urinary bladder with trabeculation, suggesting chronic bladder outlet obstruction.      PATIENT SURVEYS:  FOTO 49, predicted 53 in 13 visits   COGNITION: Overall cognitive status: Within functional limits for tasks assessed     SENSATION: "My feet feel numb all the time but not my legs, like up under the bottoms"     POSTURE: rounded shoulders, forward head, decreased lumbar lordosis, increased thoracic kyphosis, and flexed trunk   MUSCLE LENGTH  Piriformis severe limitation B HS R moderate limitation, severe limitation L    LUMBAR ROM  Lumbar flexion severe limitation  Lumbar extension mild limitation  Lumbar lateral flexion moderate limitation  B Lumbar rotation moderate limitation B       LOWER EXTREMITY MMT:  MMT Right eval Left eval  Hip flexion 3 3-  Hip extension    Hip abduction 3- 3-  Hip adduction    Hip internal rotation    Hip external rotation    Knee flexion 4+ 4+  Knee extension 4+ 4+  Ankle dorsiflexion 5 5  Ankle plantarflexion    Ankle inversion    Ankle eversion     (Blank rows = not tested)    FUNCTIONAL TESTS:  5 times sit to stand: 30 seconds no UEs from standard chair Timed up and go (TUG): 22.5 seconds SPC                      Gait speed 1.89ft/second with SPC   GAIT: Distance walked: 13ft Assistive device utilized: Single point cane Level of assistance: Modified independence Comments: flexed at hips, limited step lengths/stance times, slightly unsteady even with SPC    TODAY'S TREATMENT:  DATE:   11/01/22  TherEx  Nustep L4 x6 minutes BLEs only  Reviewed bed mobility mechanics Bridges 2x10 Supine clams red TB 2x10 PPT x15 with 3 second holds PPT + march with red TB x10 B  Sidelying hip ABD x10 B 0# STS with red TB around knees x10  Standing hip ABD red TB x10 B Standing hip extensions x10 B red TB  Standing marches + TA set x10 B  Figure 4 stretches 2x30 seconds B   10/27/22  TherEX  SciFit L4 x6 minutes (Nustep not available) Practiced safe bed mobility in context of back precautions  SKTC 5x5 seconds B Figure 4 stretch 3x30 seconds B HS stretch supine 3x30 seconds B Bridges x10 partial range  PPT x10 with 3 second holds Hip hikes heavy cues for form x10 B Standing hip ABD x10 0# B      Eval  Objective measures, appropriate education, POC, water PT and benefits thereof    PATIENT EDUCATION:  Education details: exam findings, POC,  benefits of water therapy  Person educated: Patient Education method: Explanation, Demonstration, and Handouts Education comprehension: verbalized understanding, returned demonstration, and needs further education  HOME EXERCISE PROGRAM:  Access Code: [email or web address] URL: [email or web address] Date: 10/27/2022 Prepared by: Nedra Hai  Exercises - Supine Bridge  - 1 x daily - 7 x weekly - 1 sets - 10 reps - 2 hold - Supine Posterior Pelvic Tilt  - 1 x daily - 7 x weekly - 1 sets - 10 reps - 3 hold - Supine Figure 4 Piriformis Stretch  - 1 x daily - 7 x weekly - 1 sets - 3 reps - 30 hold - Hooklying Hamstring  Stretch with Strap  - 1 x daily - 7 x weekly - 1 sets - 3 reps - 30 hold  ASSESSMENT:  CLINICAL IMPRESSION:  Kapri arrives today doing OK. Having more back pain than last visit, she tells me did the back and ab machines at the gym, encouraged not using these machines due to increased pressures on lumbar spine. Otherwise continued working on strength and mobility as per POC. Looks like she has an Passenger transport manager PT appointment next week, continue to feel this will help her a lot.    OBJECTIVE IMPAIRMENTS: Abnormal gait, decreased activity tolerance, decreased balance, decreased knowledge of use of DME, decreased mobility, difficulty walking, decreased ROM, decreased strength, hypomobility, increased fascial restrictions, impaired flexibility, impaired sensation, postural dysfunction, obesity, and pain.   ACTIVITY LIMITATIONS: standing, stairs, transfers, bed mobility, and locomotion level  PARTICIPATION LIMITATIONS: shopping, community activity, and yard work  PERSONAL FACTORS: Age, Behavior pattern, Fitness, Past/current experiences, Social background, and Time since onset of injury/illness/exacerbation are also affecting patient's functional outcome.   REHAB POTENTIAL: Fair chronicity of impairments, fibromyalgia, increased pain with activity at eval   CLINICAL DECISION MAKING: Stable/uncomplicated  EVALUATION COMPLEXITY: Low   GOALS: Goals reviewed with patient? No  SHORT TERM GOALS: Target date: 11/03/22   Will be compliant with appropriate progressive HEP  Baseline: Goal status: INITIAL  2.  Pain to be no more than 7/10 at worst  Baseline:  Goal status: INITIAL  3.  Hamstring and piriformis flexibility to have improved by 50% to assist in improving mobility and reducing pain  Baseline:  Goal status: INITIAL  4.  Lumbar AROM to have improved by 50% all directions in order to assist in improving mobility/reducing pain  Baseline:  Goal status: INITIAL   LONG TERM GOALS:  Target date: 11/24/22  MMT to have improved by 1 grade in all weak groups  Baseline:  Goal status: INITIAL  2.  Will complete 5x STS test in 15 seconds or less no UEs  Baseline:  Goal status: INITIAL  3.  Will complete TUG test in 13 seconds or less with LRAD to show improved functional balance  Baseline:  Goal status: INITIAL  4.  Pain to be no more than 5/10 at worst  Baseline:  Goal status: INITIAL  5.  FOTO score to improved to at least predicted value  Baseline:  Goal status: INITIAL   PLAN:  PT FREQUENCY: 2x/week  PT DURATION: 6 weeks  PLANNED INTERVENTIONS: Therapeutic exercises, Therapeutic activity, Neuromuscular re-education, Gait training, Self Care, Aquatic Therapy, Manual therapy, and Re-evaluation  PLAN FOR NEXT SESSION: split between water and land therapies; progressive movement as tolerated for lumbar region/hips in context of acute/subacute  Lx compression fractures, balance training, strength. Review bed mobility for back precautions PRN   Nedra Hai PT DPT PN2    Referring diagnosis?  M25.561,G89.29 (ICD-10-CM) - Chronic pain of right knee   Treatment diagnosis? (if different than referring diagnosis)   Muscle weakness (generalized) M62.81  Abnormal posture R29.3  Difficulty in walking, not elsewhere classified R26.2  Pain in right leg M79.604  Unsteadiness on feet R26.81  Other low back pain M54.59  What was this (referring dx) caused by? []  Surgery [x]  Fall [x]  Ongoing issue []  Arthritis []  Other: ____________  Laterality: [x]  Rt []  Lt []  Both  Check all possible CPT codes:  *CHOOSE 10 OR LESS*    []  97110 (Therapeutic Exercise)  []  92507 (SLP Treatment)  []  16109 (Neuro Re-ed)   []  92526 (Swallowing Treatment)   []  97116 (Gait Training)   []  [id number] (Cognitive Training, 1st 15 minutes) []  97140 (Manual Therapy)   []  97130 (Cognitive Training, each add'l 15 minutes)  []  97164 (Re-evaluation)                              []   Other, List CPT Code ____________  []  97530 (Therapeutic Activities)     []  97535 (Self Care)   [x]  All codes above (97110 - 97535)  []  97012 (Mechanical Traction)  []  97014 (E-stim Unattended)  []  97032 (E-stim manual)  []  97033 (Ionto)  []  97035 (Ultrasound) []  97750 (Physical Performance Training) [x]  [id number] (Aquatic Therapy) []  97016 (Vasopneumatic Device) []  [id number] (Paraffin) []  97034 (Contrast Bath) []  97597 (Wound Care 1st 20 sq cm) []  97598 (Wound Care each add'l 20 sq cm) []  97760 (Orthotic Fabrication, Fitting, Training Initial) []  [id number] (Prosthetic Management and Training Initial) []  [id number] (Orthotic or Prosthetic Training/ Modification Subsequent)

## 2022-11-03 ENCOUNTER — Encounter: Payer: Self-pay | Admitting: Physical Therapy

## 2022-11-03 ENCOUNTER — Ambulatory Visit (INDEPENDENT_AMBULATORY_CARE_PROVIDER_SITE_OTHER): Payer: Medicare PPO | Admitting: Physical Therapy

## 2022-11-03 ENCOUNTER — Other Ambulatory Visit: Payer: Self-pay | Admitting: Physical Medicine and Rehabilitation

## 2022-11-03 ENCOUNTER — Telehealth: Payer: Self-pay

## 2022-11-03 DIAGNOSIS — R262 Difficulty in walking, not elsewhere classified: Secondary | ICD-10-CM | POA: Diagnosis not present

## 2022-11-03 DIAGNOSIS — M5459 Other low back pain: Secondary | ICD-10-CM | POA: Diagnosis not present

## 2022-11-03 DIAGNOSIS — M79604 Pain in right leg: Secondary | ICD-10-CM | POA: Diagnosis not present

## 2022-11-03 DIAGNOSIS — R293 Abnormal posture: Secondary | ICD-10-CM

## 2022-11-03 DIAGNOSIS — R2681 Unsteadiness on feet: Secondary | ICD-10-CM

## 2022-11-03 DIAGNOSIS — M6281 Muscle weakness (generalized): Secondary | ICD-10-CM | POA: Diagnosis not present

## 2022-11-03 NOTE — Therapy (Signed)
OUTPATIENT PHYSICAL THERAPY LOWER EXTREMITY TREATMENT   Patient Name: Barbara Patton MRN: [id number] DOB:Nov 16, 1940, 82 y.o., female Today's Date: 11/03/2022  END OF SESSION:  PT End of Session - 11/03/22 0942     Visit Number 4    Number of Visits 13    Date for PT Re-Evaluation 11/24/22    Authorization Type [employer] MCR/GEHA    Authorization Time Period 10/13/22 to 11/24/22    Progress Note Due on Visit 10    PT Start Time 0933    PT Stop Time 1011    PT Time Calculation (min) 38 min    Activity Tolerance Patient tolerated treatment well    Behavior During Therapy [facility] for tasks assessed/performed                Past Medical History:  Diagnosis Date   Glaucoma    High cholesterol    Hypertension    Lupus (HCC)    Thyroid disease    Past Surgical History:  Procedure Laterality Date   ABDOMINAL HYSTERECTOMY     CATARACT EXTRACTION, BILATERAL     TOTAL KNEE ARTHROPLASTY Bilateral    TUBAL LIGATION     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   Referred otalgia of left ear 08/23/2015   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   Allergic rhinitis 07/26/2015   Anxiety 07/26/2015   Benign essential hypertension 07/26/2015   Dysfunction of left eustachian tube 07/26/2015   Esophageal reflux 07/26/2015   Fibromyalgia 07/26/2015   Pure hypercholesterolemia 07/26/2015   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 05/03/2014   Pes anserinus bursitis of left knee 12/04/2013   Trochanteric bursitis of left hip 08/07/2013   Leg  swelling 12/06/2011   Glaucoma 11/02/2011   HLD (hyperlipidemia) 11/02/2011   HTN (hypertension) 11/02/2011   Hypothyroid 11/02/2011   OA (osteoarthritis) 11/02/2011   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 Gait training Balance Fall Prevention THERAPY DIAG:  Muscle weakness (generalized)  Abnormal posture  Difficulty in walking, not elsewhere classified  Pain in right leg  Unsteadiness on feet  Other low back pain  Rationale for Evaluation and Treatment: Rehabilitation  ONSET DATE: chronic "its been awhile"   SUBJECTIVE:   SUBJECTIVE STATEMENT:  My car wouldn't start this morning and I bought a new battery 2 days ago. My right leg is still giving me trouble. Yesterday afternoon it was pretty bad. The back is doing pretty good, felt better after last session. Nothing else really new going on.   PERTINENT HISTORY: Anemia, tremor, B sensorineural hearing loss, TMJ pain, HTN, fibromyalgia, HLD, TKR + revision, anxiety, trochanteric bursitis, glaucoma, hypothyroidism, OA PAIN:  Are you having pain? Yes: NPRS scale: 5/10 Pain location: right hip and leg  Pain description: ache  Aggravating factors: "nothing just starts hurting", starts  hurting in standing  Relieving factors: CBD cream  PRECAUTIONS: Back due to acute/subacute compression fractures found on MRI 10/19/22  WEIGHT BEARING RESTRICTIONS: No  FALLS:  Has patient fallen in last 6 months? Yes. Number of falls 2- first fall was at 2am, fell trying to pick a candy wrapper off the floor; second fall fell forward trying to get off of commode. FOF +   LIVING ENVIRONMENT: Lives with: lives with their family Lives in: House/apartment Stairs: 1 STE, no steps inside  Has  following equipment at home: Single point cane and Environmental consultant - 2 wheeled  OCCUPATION: retired   PLOF: Independent, Independent with basic ADLs, Independent with household mobility with device, Independent with gait, and Independent with transfers  PATIENT GOALS: "to help my back and R LE I don't know if PT will help or"   NEXT MD VISIT: None scheduled with referring   OBJECTIVE:   DIAGNOSTIC FINDINGS:   FINDINGS: No evidence for an acute fracture in the bony pelvis. SI joints and symphysis pubis unremarkable. Joint space in the right hip is relatively well preserved. There is loss of joint space in the left hip with degenerative spurring. No evidence for left femoral neck fracture.   IMPRESSION: 1. No acute bony findings. 2. Degenerative changes in the left hip.  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 Chronic superior endplate fractures at L1 and L3. Extensive atheromatous calcification of the aorta and iliacs.   IMPRESSION: 1. No acute finding. 2. 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  THORACIC SPINE 2 VIEWS   COMPARISON:  None Available.   FINDINGS: No acute fracture or traumatic malalignment. Degenerative disc narrowing with endplate spurring in the mid and lower thoracic spine. Maintained posterior mediastinal fat planes. Mild thoracic levocurvature.   IMPRESSION: Degenerative changes without acute or focal finding.  Three-view radiographs of her right knee were reviewed today she is status  post total right knee arthroplasty components remain in stable position  when compared to previous x-rays.  No evidence of any periprosthetic  fracture    EXAM: MRI LUMBAR SPINE WITHOUT CONTRAST   TECHNIQUE: Multiplanar, multisequence MR imaging of the lumbar spine was performed. No intravenous contrast was administered.   COMPARISON:  Radiographs  10/01/2022 and 07/31/2022.  MRI 12/04/2019.   FINDINGS: Segmentation: Conventional anatomy assumed, with the last open disc space designated L5-S1.Concordant with prior imaging.   Alignment: Grade 1 anterolisthesis at L3-4 and L4-5, similar to previous MRI.   Vertebrae: There are mild superior endplate compression fractures at the L3 and L4 levels, resulting in approximately 10% loss of vertebral body height. There is associated bone marrow edema, but no osseous retropulsion or definite involvement of the posterior elements. These fractures are not clearly seen on recent or prior radiographs. No other acute osseous findings. The visualized sacroiliac joints appear unremarkable.   Conus medullaris: Extends to the L1-2 level and appears normal.   Paraspinal and other soft tissues: No acute paraspinal findings. The urinary bladder is noted to be distended and moderately trabeculated. 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   Disc levels:   Sagittal images demonstrate no significant disc space findings within the visualized lower thoracic spine. There is mild disc bulging at T11-12 and T12-L1.   L1-2: Preserved disc height. Mild facet hypertrophy. No spinal stenosis or nerve root encroachment.   L2-3: Preserved disc height with mild disc bulging and mild-to-moderate facet hypertrophy. No spinal stenosis or  nerve root encroachment.   L3-4: Moderate to severe facet hypertrophy accounts for the grade 1 anterolisthesis and contributes to annular disc bulging and uncovering. There is resulting moderate multifactorial spinal stenosis with mild lateral recess and minimal foraminal narrowing bilaterally. The spinal stenosis has progressed from the previous MRI   L4-5: Moderate to severe facet hypertrophy accounts for the grade 1 anterolisthesis and contributes to annular disc bulging and uncovering. There is only resulting borderline spinal stenosis at this level with  minimal left lateral recess and left foraminal narrowing. No nerve root encroachment. No significant changes are seen at this level.   L5-S1: Chronic disc bulging with endplate osteophytes asymmetric to the left. Mild facet hypertrophy. No spinal stenosis or nerve root encroachment.   IMPRESSION: 1. Acute/subacute mild superior endplate compression fractures at L3 and L4 with associated bone marrow edema, but no osseous retropulsion. 2. 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 3. Borderline spinal stenosis at L4-5 secondary to facet hypertrophy and a grade 1 anterolisthesis. 4. Distended urinary bladder with trabeculation, suggesting chronic bladder outlet obstruction.      PATIENT SURVEYS:  FOTO 49, predicted 53 in 13 visits   COGNITION: Overall cognitive status: Within functional limits for tasks assessed     SENSATION: "My feet feel numb all the time but not my legs, like up under the bottoms"     POSTURE: rounded shoulders, forward head, decreased lumbar lordosis, increased thoracic kyphosis, and flexed trunk   MUSCLE LENGTH  Piriformis severe limitation B; 11/03/22  R moderate limitation/L severe limitation  HS R moderate limitation, severe limitation L; 11/03/22 R mild limitation/L severe limitation    LUMBAR ROM  Lumbar flexion severe limitation  Lumbar extension mild limitation  Lumbar lateral flexion moderate limitation  B Lumbar rotation moderate limitation B       LOWER EXTREMITY MMT:  MMT Right eval Left eval  Hip flexion 3 3-  Hip extension    Hip abduction 3- 3-  Hip adduction    Hip internal rotation    Hip external rotation    Knee flexion 4+ 4+  Knee extension 4+ 4+  Ankle dorsiflexion 5 5  Ankle plantarflexion    Ankle inversion    Ankle eversion     (Blank rows = not tested)    FUNCTIONAL TESTS:  5 times sit to stand: 30 seconds no UEs from standard chair Timed up and go  (TUG): 22.5 seconds SPC                     Gait speed 1.110ft/second with SPC   GAIT: Distance walked: 65ft Assistive device utilized: Single point cane Level of assistance: Modified independence Comments: flexed at hips, limited step lengths/stance times, slightly unsteady even with SPC    TODAY'S TREATMENT:  DATE:    11/03/22  STG check/education on progress towards goals   TherEx  Nustep  L4 x6 minutes BLEs only  Bridge + ABD into red TB 2x10  Sidelying clams red TB 2x10 B  Review of bed mobility- VERY poor mechanics today Shuttle DL press: 09# W11, then 91# x12   11/01/22  TherEx  Nustep L4 x6 minutes BLEs only  Reviewed bed mobility mechanics Bridges 2x10 Supine clams red TB 2x10 PPT x15 with 3 second holds PPT + march with red TB x10 B  Sidelying hip ABD x10 B 0# STS with red TB around knees x10  Standing hip ABD red TB x10 B Standing hip extensions x10 B red TB  Standing marches + TA set x10 B  Figure 4 stretches 2x30 seconds B   10/27/22  TherEX  SciFit L4 x6 minutes (Nustep not available) Practiced safe bed mobility in context of back precautions  SKTC 5x5 seconds B Figure 4 stretch 3x30 seconds B HS stretch supine 3x30 seconds B Bridges x10 partial range  PPT x10 with 3 second holds Hip hikes heavy cues for form x10 B Standing hip ABD x10 0# B      Eval  Objective measures, appropriate education, POC, water PT and benefits thereof    PATIENT EDUCATION:  Education details: exam findings, POC,  benefits of water therapy  Person educated: Patient Education method: Explanation, Demonstration, and Handouts Education comprehension: verbalized understanding, returned demonstration, and needs further education  HOME EXERCISE PROGRAM:  Access Code: [email or web address] URL: [email or web address] Date:  10/27/2022 Prepared by: Nedra Hai  Exercises - Supine Bridge  - 1 x daily - 7 x weekly - 1 sets - 10 reps - 2 hold - Supine Posterior Pelvic Tilt  - 1 x daily - 7 x weekly - 1 sets - 10 reps - 3 hold - Supine Figure 4 Piriformis Stretch  - 1 x daily - 7 x weekly - 1 sets - 3 reps - 30 hold - Hooklying Hamstring Stretch with Strap  - 1 x daily - 7 x weekly - 1 sets - 3 reps - 30 hold  ASSESSMENT:  CLINICAL IMPRESSION:  Shoshana arrives today doing OK. Checked some STGs but did have to defer some due to precautions from acute/subacute compression fractures found after PT eval. Otherwise continued with functional strengthening and progressions there of  as appropriate. Really needed a lot of heavy cues for good technique with exercises today, also extra time to complete more challenging tasks. Progress towards STGs is slow but really she has only come for a couple of treatments since her eval on 10/13/22. Will continue efforts. She is scheduled to start water therapy next week, still hopeful this will be helpful.    OBJECTIVE IMPAIRMENTS: Abnormal gait, decreased activity tolerance, decreased balance, decreased knowledge of use of DME, decreased mobility, difficulty walking, decreased ROM, decreased strength, hypomobility, increased fascial restrictions, impaired flexibility, impaired sensation, postural dysfunction, obesity, and pain.   ACTIVITY LIMITATIONS: standing, stairs, transfers, bed mobility, and locomotion level  PARTICIPATION LIMITATIONS: shopping, community activity, and yard work  PERSONAL FACTORS: Age, Behavior pattern, Fitness, Past/current experiences, Social background, and Time since onset of injury/illness/exacerbation are also affecting patient's functional outcome.   REHAB POTENTIAL: Fair chronicity of impairments, fibromyalgia, increased pain with activity at eval   CLINICAL DECISION MAKING: Stable/uncomplicated  EVALUATION COMPLEXITY: Low   GOALS: Goals reviewed  with patient? No  SHORT TERM GOALS: Target date: 11/03/22   Will be compliant with appropriate progressive  HEP  Baseline: Goal status: IN PROGRESS 11/03/22- mixed compliance, frequency unclear per pt description    2.  Pain to be no more than 7/10 at worst  Baseline:  Goal status: IN PROGRESS 11/03/22- 8/10 at worst   3.  Hamstring and piriformis flexibility to have improved by 50% to assist in improving mobility and reducing pain  Baseline:  Goal status: IN PROGRESS 11/03/22 see above   4.  Lumbar AROM to have improved by 50% all directions in order to assist in improving mobility/reducing pain  Baseline:  Goal status: DEFERRED 11/03/22 due to new findings of acute/subacute  lumbar compression fractures    LONG TERM GOALS: Target date: 11/24/22    MMT to have improved by 1 grade in all weak groups  Baseline:  Goal status: INITIAL  2.  Will complete 5x STS test in 15 seconds or less no UEs  Baseline:  Goal status: INITIAL  3.  Will complete TUG test in 13 seconds or less with LRAD to show improved functional balance  Baseline:  Goal status: INITIAL  4.  Pain to be no more than 5/10 at worst  Baseline:  Goal status: INITIAL  5.  FOTO score to improved to at least predicted value  Baseline:  Goal status: INITIAL   PLAN:  PT FREQUENCY: 2x/week  PT DURATION: 6 weeks  PLANNED INTERVENTIONS: Therapeutic exercises, Therapeutic activity, Neuromuscular re-education, Gait training, Self Care, Aquatic Therapy, Manual therapy, and Re-evaluation  PLAN FOR NEXT SESSION: split between water and land therapies; progressive movement as tolerated for lumbar region/hips in context of acute/subacute  Lx compression fractures, balance training, strength.   Nedra Hai PT DPT PN2    Referring diagnosis?  M25.561,G89.29 (ICD-10-CM) - Chronic pain of right knee   Treatment diagnosis? (if different than referring diagnosis)   Muscle weakness (generalized) M62.81  Abnormal posture  R29.3  Difficulty in walking, not elsewhere classified R26.2  Pain in right leg M79.604  Unsteadiness on feet R26.81  Other low back pain M54.59  What was this (referring dx) caused by? []  Surgery [x]  Fall [x]  Ongoing issue []  Arthritis []  Other: ____________  Laterality: [x]  Rt []  Lt []  Both  Check all possible CPT codes:  *CHOOSE 10 OR LESS*    []  97110 (Therapeutic Exercise)  []  92507 (SLP Treatment)  []  97112 (Neuro Re-ed)   []  92526 (Swallowing Treatment)   []  97116 (Gait Training)   []  [id number] (Cognitive Training, 1st 15 minutes) []  97140 (Manual Therapy)   []  97130 (Cognitive Training, each add'l 15 minutes)  []  97164 (Re-evaluation)                              []  Other, List CPT Code ____________  []  97530 (Therapeutic Activities)     []  97535 (Self Care)   [x]  All codes above (97110 - 97535)  []  97012 (Mechanical Traction)  []  95621 (E-stim Unattended)  []  97032 (E-stim manual)  []  97033 (Ionto)  []  97035 (Ultrasound) []  97750 (Physical Performance Training) [x]  [id number] (Aquatic Therapy) []  97016 (Vasopneumatic Device) []  [id number] (Paraffin) []  97034 (Contrast Bath) []  97597 (Wound Care 1st 20 sq cm) []  97598 (Wound Care each add'l 20 sq cm) []  97760 (Orthotic Fabrication, Fitting, Training Initial) []  [id number] (Prosthetic Management and Training Initial) []  [id number] (Orthotic or Prosthetic Training/ Modification Subsequent)

## 2022-11-03 NOTE — Telephone Encounter (Signed)
Patient is scheduled for injection on 11/15/22. Needs pre procedure Valium sent to [facility]

## 2022-11-07 ENCOUNTER — Ambulatory Visit (HOSPITAL_COMMUNITY)
Admission: EM | Admit: 2022-11-07 | Discharge: 2022-11-07 | Disposition: A | Discharge status: Home / Self Care | Payer: Medicare PPO | Attending: Internal Medicine | Admitting: Internal Medicine

## 2022-11-07 ENCOUNTER — Ambulatory Visit (INDEPENDENT_AMBULATORY_CARE_PROVIDER_SITE_OTHER): Payer: Medicare PPO

## 2022-11-07 ENCOUNTER — Ambulatory Visit (HOSPITAL_COMMUNITY): Payer: Medicare PPO

## 2022-11-07 ENCOUNTER — Encounter (HOSPITAL_COMMUNITY): Payer: Self-pay

## 2022-11-07 DIAGNOSIS — S90421A Blister (nonthermal), right great toe, initial encounter: Secondary | ICD-10-CM | POA: Diagnosis not present

## 2022-11-07 DIAGNOSIS — M2012 Hallux valgus (acquired), left foot: Secondary | ICD-10-CM | POA: Diagnosis not present

## 2022-11-07 DIAGNOSIS — S92405A Nondisplaced unspecified fracture of left great toe, initial encounter for closed fracture: Secondary | ICD-10-CM

## 2022-11-07 DIAGNOSIS — S92404A Nondisplaced unspecified fracture of right great toe, initial encounter for closed fracture: Secondary | ICD-10-CM

## 2022-11-07 DIAGNOSIS — S92425A Nondisplaced fracture of distal phalanx of left great toe, initial encounter for closed fracture: Secondary | ICD-10-CM

## 2022-11-07 DIAGNOSIS — M19072 Primary osteoarthritis, left ankle and foot: Secondary | ICD-10-CM | POA: Diagnosis not present

## 2022-11-07 MED ORDER — CEPHALEXIN 500 MG PO CAPS: 500.0000 mg | ORAL_CAPSULE | Freq: Two times a day (BID) | ORAL | 0 refills | Status: AC

## 2022-11-07 NOTE — Discharge Instructions (Addendum)
You broke the end of your right toe. I drained the blister to your toe.  Take keflex antibiotic twice daily for 7 days to prevent infection to the right great toe. Wear the post-op shoe until your follow-up appointment with orthopedics.  Take tylenol as needed for pain.  Schedule an appointment with foot specialist listed on your paperwork.  If you develop any new or worsening symptoms or do not improve in the next 2 to 3 days, please return.  If your symptoms are severe, please go to the emergency room.  Follow-up with your primary care provider for further evaluation and management of your symptoms as well as ongoing wellness visits.  I hope you feel better!

## 2022-11-07 NOTE — ED Provider Notes (Signed)
[facility]    CSN: [id number] Arrival date & time: 11/07/22  1106      History   Chief Complaint No chief complaint on file.   HPI Barbara Patton is a 82 y.o. female.   Patient presents to urgent care for evaluation of pain to the left great toe that started yesterday after a large wine bottle accidentally fell onto the left great toe causing the wine bottle to break. A large blister has formed to the dorsal aspect of the left great toe approximately the size of a dime as a result of the injury. No numbness/tingling distally to injury. States the tip of the toe is very tender to touch but she has been able to walk without significant difficulty. Taking tylenol for pain with some relief.      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   Referred otalgia of left ear 08/23/2015   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   Allergic rhinitis 07/26/2015   Anxiety 07/26/2015   Benign essential hypertension 07/26/2015   Dysfunction of left eustachian tube 07/26/2015   Esophageal reflux 07/26/2015   Fibromyalgia 07/26/2015   Pure hypercholesterolemia 07/26/2015   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 05/03/2014   Pes anserinus bursitis of left knee 12/04/2013   Trochanteric bursitis of left hip 08/07/2013   Leg swelling 12/06/2011   Glaucoma 11/02/2011   HLD (hyperlipidemia) 11/02/2011   HTN  (hypertension) 11/02/2011   Hypothyroid 11/02/2011   OA (osteoarthritis) 11/02/2011   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) 03/16/2011   Presence of artificial knee joint 03/16/2011    Past Surgical History:  Procedure Laterality Date   ABDOMINAL HYSTERECTOMY     CATARACT EXTRACTION, BILATERAL     TOTAL KNEE ARTHROPLASTY Bilateral    TUBAL LIGATION      OB History   No obstetric history on file.      Home Medications    Prior to Admission medications   Medication Sig Start Date End Date Taking? Authorizing Provider  cephALEXin (KEFLEX) 500 MG capsule Take 1 capsule (500 mg total) by mouth 2 (two) times daily for 7 days. 11/07/22 11/14/22 Yes StanhopeDonavan Burnet, FNP  acetaminophen (TYLENOL) 325 MG tablet Take 650 mg by mouth 2 (two) times daily as needed (pain).    [provider]  Cholecalciferol (VITAMIN D-3 PO) Take 1 tablet by mouth daily.    [provider]  diazepam (VALIUM) 5 MG tablet Take one tablet by mouth with food one hour prior to procedure. May repeat 30 minutes prior if needed. 10/31/22   Juanda Chance, NP  diclofenac Sodium (VOLTAREN) 1 % GEL Add'l Sig Add'l Sig topical Add'l Sig 08/21/19   [provider]  DULoxetine (CYMBALTA) 30 MG capsule  11/03/20   [provider]  furosemide (LASIX) 40 MG tablet Take 40 mg by mouth daily.  12/22/19   [provider]  hydroxychloroquine (PLAQUENIL) 200 MG tablet Take 200 mg by mouth daily.  06/19/16   [provider]  hydrOXYzine (ATARAX/VISTARIL) 10 MG tablet Take 10 mg by mouth every 6 (six) hours as needed for itching or anxiety. 06/10/17   [provider]  levothyroxine (SYNTHROID) 100 MCG tablet Take 100 mcg by mouth daily. 11/11/19   [provider]  Multiple Vitamins-Minerals (MULTIVITAMIN PO) Take 1 tablet by mouth daily.     [provider]  mupirocin ointment (BACTROBAN) 2 % Apply 1 Application topically 2 (two) times daily. To  affected area till better 07/30/22   Zenia Resides, MD  omeprazole (PRILOSEC) 40 MG capsule Take 40 mg by mouth daily.  03/09/16   [provider]  propranolol (INDERAL) 20 MG tablet Take 1 tablet (20 mg total) by mouth 2 (two) times daily. 06/14/22   Tat, Octaviano Batty, DO  simvastatin (ZOCOR) 20 MG tablet Take 20 mg by mouth every evening.  11/06/13   [provider]  traMADol (ULTRAM) 50 MG tablet Take 50 mg by mouth 2 (two) times daily as needed (pain). 01/01/14   [provider]  estradiol (ESTRACE) 0.5 MG tablet  02/14/18 11/09/19  [provider]    Family History Family History  Problem Relation Age of Onset   Cancer Father    Cancer Brother    Cancer Brother    Stroke Brother     Social History Social History   Tobacco Use   Smoking status: Former    Types: Cigarettes    Quit date: 1997    Years since quitting: 27.4   Smokeless tobacco: Never  Vaping Use   Vaping Use: Never used  Substance Use Topics   Alcohol use: Yes    Alcohol/week: 3.0 standard drinks of alcohol    Types: 3 Glasses of wine per week    Comment: occ wine   Drug use: No     Allergies   Codeine, Penicillin g, and Penicillins   Review of Systems Review of Systems Per HPI  Physical Exam Triage Vital Signs ED Triage Vitals  Enc Vitals Group     BP 11/07/22 1137 (!) 161/82     Pulse Rate 11/07/22 1137 83     Resp 11/07/22 1137 16     Temp 11/07/22 1137 97.9 F (36.6 C)     Temp Source 11/07/22 1137 Oral     SpO2 11/07/22 1137 94 %     Weight --      Height --      Head Circumference --      Peak Flow --      Pain Score 11/07/22 1138 6     Pain Loc --      Pain Edu? --      Excl. in GC? --    No data found.  Updated Vital Signs BP (!) 161/82 (BP Location: Left Arm)   Pulse 83   Temp 97.9 F (36.6 C) (Oral)   Resp 16   SpO2 94%   Visual Acuity Right Eye Distance:   Left Eye Distance:   Bilateral Distance:    Right Eye Near:   Left Eye  Near:    Bilateral Near:     Physical Exam Vitals and nursing note reviewed.  Constitutional:      Appearance: She is not ill-appearing or toxic-appearing.  HENT:     Head: Normocephalic and atraumatic.     Right Ear:  Hearing and external ear normal.     Left Ear: Hearing and external ear normal.     Nose: Nose normal.     Mouth/Throat:     Lips: Pink.  Eyes:     General: Lids are normal. Vision grossly intact. Gaze aligned appropriately.     Extraocular Movements: Extraocular movements intact.     Conjunctiva/sclera: Conjunctivae normal.  Cardiovascular:     Pulses:          Dorsalis pedis pulses are 2+ on the left side.  Pulmonary:     Effort: Pulmonary effort is normal.  Musculoskeletal:     Cervical back: Neck supple.  Feet:     Left foot:     Skin integrity: Blister present.     Toenail Condition: Left toenails are normal.     Comments: Tenderness to palpation to the distal left great toe. Large blister present to the dorsal aspect of the left great toe as seen in image below. Blister affects ROM due to "tightness" described by patient when completing ROM actively, but she is able to move all 5 toes of the left foot. Cap refill to the affected digit is less than 3.  Skin:    General: Skin is warm and dry.     Capillary Refill: Capillary refill takes less than 2 seconds.     Findings: No rash.  Neurological:     General: No focal deficit present.     Mental Status: She is alert and oriented to person, place, and time. Mental status is at baseline.     Cranial Nerves: No dysarthria or facial asymmetry.  Psychiatric:        Mood and Affect: Mood normal.        Speech: Speech normal.        Behavior: Behavior normal.        Thought Content: Thought content normal.        Judgment: Judgment normal.      UC Treatments / Results  Labs (all labs ordered are listed, but only abnormal results are displayed) Labs Reviewed - No data to  display  EKG   Radiology CLINICAL DATA:  Fall bottle of wine on left great toe yesterday. Posterior area to the toe.   EXAM: LEFT FOOT - COMPLETE 3+ VIEW   COMPARISON:  Left foot radiographs 10/20/2021   FINDINGS: There is a new longitudinal linear lucency within the medial aspect of the distal phalanx of the great toe extending from the proximal metaphysis through the distal tip. There is up to 10 mm transverse fracture line diastasis but otherwise no significant displacement.   There is again moderate hallux valgus. Mild lateral greater than medial great toe metatarsophalangeal joint space narrowing and lateral and dorsal degenerative osteophytosis. Mild joint space narrowing of the interphalangeal joints diffusely.   Mild-to-moderate joint space narrowing and subchondral sclerosis throughout the first through fifth tarsometatarsal joints.   Mild dorsal talonavicular, navicular-cuneiform, and tarsometatarsal degenerative osteophytes.   Tiny plantar and posterior calcaneal heel spurs.   IMPRESSION: 1. Acute minimally displaced fracture of the medial aspect of the distal phalanx of the great toe. 2. Moderate hallux valgus. 3. 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  Procedures Incision and Drainage  Date/Time: 11/07/2022 10:26 AM  Performed by: Carlisle Beers, FNP Authorized by: Carlisle Beers, FNP   Consent:    Consent obtained:  Verbal   Consent given by:  Patient   Risks, benefits, and alternatives were discussed: yes  Risks discussed:  Damage to other organs, bleeding, incomplete drainage, infection and pain   Alternatives discussed:  No treatment Universal protocol:    Procedure explained and questions answered to patient or proxy's satisfaction: yes     Patient identity confirmed:  Verbally with patient Location:    Type:  Bulla   Size:  1cm   Location:  Lower extremity   Lower extremity location:  Toe   Toe location:  L big  toe Pre-procedure details:    Skin preparation:  Chlorhexidine Sedation:    Sedation type:  None Anesthesia:    Anesthesia method:  None Procedure type:    Complexity:  Simple Procedure details:    Incision types:  Stab incision (18 guague needle)   Incision depth:  Dermal   Drainage:  Serous and serosanguinous   Drainage amount:  Moderate   Wound treatment:  Wound left open   Packing materials:  None Post-procedure details:    Procedure completion:  Tolerated well, no immediate complications  (including critical care time)  Medications Ordered in UC Medications - No data to display  Initial Impression / Assessment and Plan / UC Course  I have reviewed the triage vital signs and the nursing notes.  Pertinent labs & imaging results that were available during my care of the patient were reviewed by me and considered in my medical decision making (see chart for details).   1. Closed displaced fracture of the distal phalanx of left great toe X-ray shows acute minimally displaced fracture of the distal phalanx of the left great toe. Post op shoe placed for stability and comfort. May use tylenol as needed for pain as this is helping at home. RICE advised. Bulla drained, see procedure note above for details. Keflex sent to pharmacy to be taken BID for 7 days to prevent infection related to I&D with underlying fracture given age and history of immunosuppression (lupus). Walking referral to [facility] provided, encouraged to call for follow-up appointment in 3-4 days.   Discussed physical exam and available lab work findings in clinic with patient.  Counseled patient regarding appropriate use of medications and potential side effects for all medications recommended or prescribed today. Discussed red flag signs and symptoms of worsening condition,when to call the PCP office, return to urgent care, and when to seek higher level of care in the emergency department. Patient  verbalizes understanding and agreement with plan. All questions answered. Patient discharged in stable condition.    Final Clinical Impressions(s) / UC Diagnoses   Final diagnoses:  Blister of great toe of right foot, initial encounter  Closed nondisplaced fracture of distal phalanx of left great toe, initial encounter     Discharge Instructions      You broke the end of your right toe. I drained the blister to your toe.  Take keflex antibiotic twice daily for 7 days to prevent infection to the right great toe. Wear the post-op shoe until your follow-up appointment with orthopedics.  Take tylenol as needed for pain.  Schedule an appointment with foot specialist listed on your paperwork.  If you develop any new or worsening symptoms or do not improve in the next 2 to 3 days, please return.  If your symptoms are severe, please go to the emergency room.  Follow-up with your primary care provider for further evaluation and management of your symptoms as well as ongoing wellness visits.  I hope you feel better!    ED Prescriptions  Medication Sig Dispense Auth. Provider   cephALEXin (KEFLEX) 500 MG capsule Take 1 capsule (500 mg total) by mouth 2 (two) times daily for 7 days. 14 capsule Carlisle Beers, FNP      PDMP not reviewed this encounter.   Carlisle Beers, [place] 11/10/22 1031

## 2022-11-07 NOTE — ED Triage Notes (Signed)
Patient states a full bottle of wine fell on her left great toe yesterday. Patient has a blistered area to the toe.  Patient has not taken or used any medications for her toe.

## 2022-11-08 ENCOUNTER — Ambulatory Visit (INDEPENDENT_AMBULATORY_CARE_PROVIDER_SITE_OTHER): Payer: Medicare PPO | Admitting: Surgical

## 2022-11-08 ENCOUNTER — Ambulatory Visit (INDEPENDENT_AMBULATORY_CARE_PROVIDER_SITE_OTHER): Payer: Medicare PPO | Admitting: Physical Therapy

## 2022-11-08 ENCOUNTER — Encounter: Payer: Self-pay | Admitting: Physical Therapy

## 2022-11-08 DIAGNOSIS — R262 Difficulty in walking, not elsewhere classified: Secondary | ICD-10-CM

## 2022-11-08 DIAGNOSIS — M79604 Pain in right leg: Secondary | ICD-10-CM

## 2022-11-08 DIAGNOSIS — R2681 Unsteadiness on feet: Secondary | ICD-10-CM

## 2022-11-08 DIAGNOSIS — M5459 Other low back pain: Secondary | ICD-10-CM

## 2022-11-08 DIAGNOSIS — M5416 Radiculopathy, lumbar region: Secondary | ICD-10-CM

## 2022-11-08 DIAGNOSIS — M6281 Muscle weakness (generalized): Secondary | ICD-10-CM | POA: Diagnosis not present

## 2022-11-08 DIAGNOSIS — R293 Abnormal posture: Secondary | ICD-10-CM | POA: Diagnosis not present

## 2022-11-08 NOTE — Therapy (Signed)
OUTPATIENT PHYSICAL THERAPY LOWER EXTREMITY TREATMENT   Patient Name: Barbara Patton MRN: [id number] DOB:1940-06-07, 82 y.o., female Today's Date: 11/08/2022  END OF SESSION:  PT End of Session - 11/08/22 1033     Visit Number 5    Number of Visits 13    Date for PT Re-Evaluation 11/24/22    Authorization Type [employer] MCR/GEHA    Authorization Time Period 10/13/22 to 11/24/22    Progress Note Due on Visit 10    PT Start Time 1015    PT Stop Time 1053    PT Time Calculation (min) 38 min    Activity Tolerance Patient tolerated treatment well    Behavior During Therapy 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   Referred otalgia of left ear 08/23/2015   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   Allergic rhinitis 07/26/2015   Anxiety 07/26/2015   Benign essential hypertension 07/26/2015   Dysfunction of left eustachian tube 07/26/2015   Esophageal reflux 07/26/2015   Fibromyalgia 07/26/2015   Pure hypercholesterolemia 07/26/2015   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 05/03/2014   Pes anserinus bursitis of left knee 12/04/2013   Trochanteric bursitis of left hip 08/07/2013    Leg swelling 12/06/2011   Glaucoma 11/02/2011   HLD (hyperlipidemia) 11/02/2011   HTN (hypertension) 11/02/2011   Hypothyroid 11/02/2011   OA (osteoarthritis) 11/02/2011   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 Gait training Balance Fall Prevention THERAPY DIAG:  Muscle weakness (generalized)  Abnormal posture  Difficulty in walking, not elsewhere classified  Pain in right leg  Unsteadiness on feet  Other low back pain  Rationale for Evaluation and Treatment: Rehabilitation  ONSET DATE: chronic "its been awhile"   SUBJECTIVE:   SUBJECTIVE STATEMENT: She relays she saw PA today who went over her MRI and recommended she get injections with Dr Alvester Morin next week. She went to urgent care yesterday due to broken toe.   PERTINENT HISTORY: Anemia, tremor, B sensorineural hearing loss, TMJ pain, HTN, fibromyalgia, HLD, TKR + revision, anxiety, trochanteric bursitis, glaucoma, hypothyroidism, OA PAIN:  Are you having pain? Yes: NPRS scale: 0 at rest and up to10 with standing activity /10 Pain location: right hip and leg  Pain description: ache  Aggravating factors: "nothing just starts hurting", starts hurting in standing  Relieving factors:  CBD cream  PRECAUTIONS: Back due to acute/subacute compression fractures found on MRI 10/19/22  WEIGHT BEARING RESTRICTIONS: No  FALLS:  Has patient fallen in last 6 months? Yes. Number of falls 2- first fall was at 2am, fell trying to pick a candy wrapper off the floor; second fall fell forward trying to get off of commode. FOF +   LIVING ENVIRONMENT: Lives with: lives with their family Lives in: House/apartment Stairs: 1 STE, no steps inside  Has following equipment at home: Single point  cane and Environmental consultant - 2 wheeled  OCCUPATION: retired   PLOF: Independent, Independent with basic ADLs, Independent with household mobility with device, Independent with gait, and Independent with transfers  PATIENT GOALS: "to help my back and R LE I don't know if PT will help or"   NEXT MD VISIT: None scheduled with referring   OBJECTIVE:   DIAGNOSTIC FINDINGS:   FINDINGS: No evidence for an acute fracture in the bony pelvis. SI joints and symphysis pubis unremarkable. Joint space in the right hip is relatively well preserved. There is loss of joint space in the left hip with degenerative spurring. No evidence for left femoral neck fracture.   IMPRESSION: 1. No acute bony findings. 2. Degenerative changes in the left hip.  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 Chronic superior endplate fractures at L1 and L3. Extensive atheromatous calcification of the aorta and iliacs.   IMPRESSION: 1. No acute finding. 2. 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  THORACIC SPINE 2 VIEWS   COMPARISON:  None Available.   FINDINGS: No acute fracture or traumatic malalignment. Degenerative disc narrowing with endplate spurring in the mid and lower thoracic spine. Maintained posterior mediastinal fat planes. Mild thoracic levocurvature.   IMPRESSION: Degenerative changes without acute or focal finding.  Three-view radiographs of her right knee were reviewed today she is status  post total right knee arthroplasty components remain in stable position  when compared to previous x-rays.  No evidence of any periprosthetic  fracture    EXAM: MRI LUMBAR SPINE WITHOUT CONTRAST   TECHNIQUE: Multiplanar, multisequence MR imaging of the lumbar spine was performed. No intravenous contrast was administered.   COMPARISON:  Radiographs 10/01/2022 and 07/31/2022.  MRI 12/04/2019.    FINDINGS: Segmentation: Conventional anatomy assumed, with the last open disc space designated L5-S1.Concordant with prior imaging.   Alignment: Grade 1 anterolisthesis at L3-4 and L4-5, similar to previous MRI.   Vertebrae: There are mild superior endplate compression fractures at the L3 and L4 levels, resulting in approximately 10% loss of vertebral body height. There is associated bone marrow edema, but no osseous retropulsion or definite involvement of the posterior elements. These fractures are not clearly seen on recent or prior radiographs. No other acute osseous findings. The visualized sacroiliac joints appear unremarkable.   Conus medullaris: Extends to the L1-2 level and appears normal.   Paraspinal and other soft tissues: No acute paraspinal findings. The urinary bladder is noted to be distended and moderately trabeculated. 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   Disc levels:   Sagittal images demonstrate no significant disc space findings within the visualized lower thoracic spine. There is mild disc bulging at T11-12 and T12-L1.   L1-2: Preserved disc height. Mild facet hypertrophy. No spinal stenosis or nerve root encroachment.   L2-3: Preserved disc height with mild disc bulging and mild-to-moderate facet hypertrophy. No spinal stenosis or nerve root encroachment.   L3-4:  Moderate to severe facet hypertrophy accounts for the grade 1 anterolisthesis and contributes to annular disc bulging and uncovering. There is resulting moderate multifactorial spinal stenosis with mild lateral recess and minimal foraminal narrowing bilaterally. The spinal stenosis has progressed from the previous MRI   L4-5: Moderate to severe facet hypertrophy accounts for the grade 1 anterolisthesis and contributes to annular disc bulging and uncovering. There is only resulting borderline spinal stenosis at this level with minimal left lateral recess and left  foraminal narrowing. No nerve root encroachment. No significant changes are seen at this level.   L5-S1: Chronic disc bulging with endplate osteophytes asymmetric to the left. Mild facet hypertrophy. No spinal stenosis or nerve root encroachment.   IMPRESSION: 1. Acute/subacute mild superior endplate compression fractures at L3 and L4 with associated bone marrow edema, but no osseous retropulsion. 2. 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 3. Borderline spinal stenosis at L4-5 secondary to facet hypertrophy and a grade 1 anterolisthesis. 4. Distended urinary bladder with trabeculation, suggesting chronic bladder outlet obstruction.      PATIENT SURVEYS:  FOTO 49, predicted 53 in 13 visits   COGNITION: Overall cognitive status: Within functional limits for tasks assessed     SENSATION: "My feet feel numb all the time but not my legs, like up under the bottoms"     POSTURE: rounded shoulders, forward head, decreased lumbar lordosis, increased thoracic kyphosis, and flexed trunk   MUSCLE LENGTH  Piriformis severe limitation B; 11/03/22  R moderate limitation/L severe limitation  HS R moderate limitation, severe limitation L; 11/03/22 R mild limitation/L severe limitation    LUMBAR ROM  Lumbar flexion severe limitation  Lumbar extension mild limitation  Lumbar lateral flexion moderate limitation  B Lumbar rotation moderate limitation B       LOWER EXTREMITY MMT:  MMT Right eval Left eval  Hip flexion 3 3-  Hip extension    Hip abduction 3- 3-  Hip adduction    Hip internal rotation    Hip external rotation    Knee flexion 4+ 4+  Knee extension 4+ 4+  Ankle dorsiflexion 5 5  Ankle plantarflexion    Ankle inversion    Ankle eversion     (Blank rows = not tested)    FUNCTIONAL TESTS:  5 times sit to stand: 30 seconds no UEs from standard chair Timed up and go (TUG): 22.5 seconds SPC                      Gait speed 1.56ft/second with SPC   GAIT: Distance walked: 50ft Assistive device utilized: Single point cane Level of assistance: Modified independence Comments: flexed at hips, limited step lengths/stance times, slightly unsteady even with SPC    TODAY'S TREATMENT:  DATE:  11/08/22  TherEx  Nustep L5 x7 minutes BLEs only  Supine Single knee to chest stretch 10 sec X 5 bilat Supine low trunk rotations 10 sec X 5 bilat Supine piriformis stretch 20 sec X 3 bilat Bridges X 15 Supine clams red TB 2x10 Standing hip ABD red TB x10 B Standing hip extensions x10 B red TB   11/03/22  STG check/education on progress towards goals   TherEx  Nustep  L4 x6 minutes BLEs only  Bridge + ABD into red TB 2x10  Sidelying clams red TB 2x10 B  Review of bed mobility- VERY poor mechanics today Shuttle DL press: 16# X09, then 60# x12    PATIENT EDUCATION:  Education details: exam findings, POC,  benefits of water therapy  Person educated: Patient Education method: Explanation, Demonstration, and Handouts Education comprehension: verbalized understanding, returned demonstration, and needs further education  HOME EXERCISE PROGRAM:  Access Code: [email or web address] URL: [email or web address] Date: 10/27/2022 Prepared by: Nedra Hai  Exercises - Supine Bridge  - 1 x daily - 7 x weekly - 1 sets - 10 reps - 2 hold - Supine Posterior Pelvic Tilt  - 1 x daily - 7 x weekly - 1 sets - 10 reps - 3 hold - Supine Figure 4 Piriformis Stretch  - 1 x daily - 7 x weekly - 1 sets - 3 reps - 30 hold - Hooklying Hamstring Stretch with Strap  - 1 x daily - 7 x weekly - 1 sets - 3 reps - 30 hold  ASSESSMENT:  CLINICAL IMPRESSION: She has broken big toe with blister wound and arrives in boot today with her toe wrapped up. Because of this I do not recommend she attend her  first aquatic PT session this week until her toe has healed. She will call us back to schedule this when her toe has healed. We did still work on lumbar/hip mobility and stretching to tolerance and she will have injections from Dr. Alvester Morin next week.   OBJECTIVE IMPAIRMENTS: Abnormal gait, decreased activity tolerance, decreased balance, decreased knowledge of use of DME, decreased mobility, difficulty walking, decreased ROM, decreased strength, hypomobility, increased fascial restrictions, impaired flexibility, impaired sensation, postural dysfunction, obesity, and pain.   ACTIVITY LIMITATIONS: standing, stairs, transfers, bed mobility, and locomotion level  PARTICIPATION LIMITATIONS: shopping, community activity, and yard work  PERSONAL FACTORS: Age, Behavior pattern, Fitness, Past/current experiences, Social background, and Time since onset of injury/illness/exacerbation are also affecting patient's functional outcome.   REHAB POTENTIAL: Fair chronicity of impairments, fibromyalgia, increased pain with activity at eval   CLINICAL DECISION MAKING: Stable/uncomplicated  EVALUATION COMPLEXITY: Low   GOALS: Goals reviewed with patient? No  SHORT TERM GOALS: Target date: 11/03/22   Will be compliant with appropriate progressive HEP  Baseline: Goal status: IN PROGRESS 11/03/22- mixed compliance, frequency unclear per pt description    2.  Pain to be no more than 7/10 at worst  Baseline:  Goal status: IN PROGRESS 11/03/22- 8/10 at worst   3.  Hamstring and piriformis flexibility to have improved by 50% to assist in improving mobility and reducing pain  Baseline:  Goal status: IN PROGRESS 11/03/22 see above   4.  Lumbar AROM to have improved by 50% all directions in order to assist in improving mobility/reducing pain  Baseline:  Goal status: DEFERRED 11/03/22 due to new findings of acute/subacute  lumbar compression fractures    LONG TERM GOALS: Target date: 11/24/22    MMT to have  improved by 1 grade  in all weak groups  Baseline:  Goal status: INITIAL  2.  Will complete 5x STS test in 15 seconds or less no UEs  Baseline:  Goal status: INITIAL  3.  Will complete TUG test in 13 seconds or less with LRAD to show improved functional balance  Baseline:  Goal status: INITIAL  4.  Pain to be no more than 5/10 at worst  Baseline:  Goal status: INITIAL  5.  FOTO score to improved to at least predicted value  Baseline:  Goal status: INITIAL   PLAN:  PT FREQUENCY: 2x/week  PT DURATION: 6 weeks  PLANNED INTERVENTIONS: Therapeutic exercises, Therapeutic activity, Neuromuscular re-education, Gait training, Self Care, Aquatic Therapy, Manual therapy, and Re-evaluation  PLAN FOR NEXT SESSION: split between water and land therapies; progressive movement as tolerated for lumbar region/hips in context of acute/subacute  Lx compression fractures, balance training, strength.   Ivery Quale, PT, DPT 11/08/22 10:49 AM    Referring diagnosis?  M25.561,G89.29 (ICD-10-CM) - Chronic pain of right knee   Treatment diagnosis? (if different than referring diagnosis)   Muscle weakness (generalized) M62.81  Abnormal posture R29.3  Difficulty in walking, not elsewhere classified R26.2  Pain in right leg M79.604  Unsteadiness on feet R26.81  Other low back pain M54.59  What was this (referring dx) caused by? []  Surgery [x]  Fall [x]  Ongoing issue []  Arthritis []  Other: ____________  Laterality: [x]  Rt []  Lt []  Both  Check all possible CPT codes:  *CHOOSE 10 OR LESS*    []  97110 (Therapeutic Exercise)  []  92507 (SLP Treatment)  []  97112 (Neuro Re-ed)   []  92526 (Swallowing Treatment)   []  97116 (Gait Training)   []  [id number] (Cognitive Training, 1st 15 minutes) []  97140 (Manual Therapy)   []  97130 (Cognitive Training, each add'l 15 minutes)  []  97164 (Re-evaluation)                              []  Other, List CPT Code ____________  []  97530 (Therapeutic  Activities)     []  97535 (Self Care)   [x]  All codes above (97110 - 97535)  []  82956 (Mechanical Traction)  []  97014 (E-stim Unattended)  []  97032 (E-stim manual)  []  97033 (Ionto)  []  97035 (Ultrasound) []  97750 (Physical Performance Training) [x]  [id number] (Aquatic Therapy) []  97016 (Vasopneumatic Device) []  [id number] (Paraffin) []  97034 (Contrast Bath) []  97597 (Wound Care 1st 20 sq cm) []  21308 (Wound Care each add'l 20 sq cm) []  97760 (Orthotic Fabrication, Fitting, Training Initial) []  [id number] (Prosthetic Management and Training Initial) []  [id number] (Orthotic or Prosthetic Training/ Modification Subsequent)

## 2022-11-10 ENCOUNTER — Ambulatory Visit: Payer: Medicare PPO | Admitting: Physical Therapy

## 2022-11-10 ENCOUNTER — Encounter: Payer: Self-pay | Admitting: Surgical

## 2022-11-10 NOTE — Progress Notes (Signed)
Office Visit Note   Patient: Barbara Patton           Date of Birth: [birth year]           MRN: [id number] Visit Date: 11/08/2022 Requested by: Renford Dills, MD [address] [place],  [place] [address] PCP: Renford Dills, MD  Subjective: Chief Complaint  Patient presents with   Lower Back - Follow-up    HPI: Barbara Patton is a 82 y.o. female who presents to the office for MRI review.   Continues to complain mainly of low back pain with right-sided radicular pain that travels down the thigh into her ankle.  Also has associated lateral hip pain.  Tingling in her calf and shin.  Leg pain bothers her more than her back pain.  She has recently set up ESI's with Dr. Alvester Morin after speaking with Aundra Millet about her MRI scan.  No pain that wakes her up at night.  No red flag signs or symptoms such as bowel/bladder incontinence or saddle anesthesia.  MRI results revealed: MR LUMBAR SPINE WO CONTRAST  Result Date: 10/26/2022 CLINICAL DATA:  Low back pain with lumbar radiculopathy for 5 years. No known injury. At the time of recent thoracolumbar spine radiographs, a recent fall is reported. EXAM: MRI LUMBAR SPINE WITHOUT CONTRAST TECHNIQUE: Multiplanar, multisequence MR imaging of the lumbar spine was performed. No intravenous contrast was administered. COMPARISON:  Radiographs 10/01/2022 and 07/31/2022.  MRI 12/04/2019. FINDINGS: Segmentation: Conventional anatomy assumed, with the last open disc space designated L5-S1.Concordant with prior imaging. Alignment: Grade 1 anterolisthesis at L3-4 and L4-5, similar to previous MRI. Vertebrae: There are mild superior endplate compression fractures at the L3 and L4 levels, resulting in approximately 10% loss of vertebral body height. There is associated bone marrow edema, but no osseous retropulsion or definite involvement of the posterior elements. These fractures are not clearly seen on recent or prior radiographs. No other acute osseous findings.  The visualized sacroiliac joints appear unremarkable. Conus medullaris: Extends to the L1-2 level and appears normal. Paraspinal and other soft tissues: No acute paraspinal findings. The urinary bladder is noted to be distended and moderately trabeculated. 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 Disc levels: Sagittal images demonstrate no significant disc space findings within the visualized lower thoracic spine. There is mild disc bulging at T11-12 and T12-L1. L1-2: Preserved disc height. Mild facet hypertrophy. No spinal stenosis or nerve root encroachment. L2-3: Preserved disc height with mild disc bulging and mild-to-moderate facet hypertrophy. No spinal stenosis or nerve root encroachment. L3-4: Moderate to severe facet hypertrophy accounts for the grade 1 anterolisthesis and contributes to annular disc bulging and uncovering. There is resulting moderate multifactorial spinal stenosis with mild lateral recess and minimal foraminal narrowing bilaterally. The spinal stenosis has progressed from the previous MRI L4-5: Moderate to severe facet hypertrophy accounts for the grade 1 anterolisthesis and contributes to annular disc bulging and uncovering. There is only resulting borderline spinal stenosis at this level with minimal left lateral recess and left foraminal narrowing. No nerve root encroachment. No significant changes are seen at this level. L5-S1: Chronic disc bulging with endplate osteophytes asymmetric to the left. Mild facet hypertrophy. No spinal stenosis or nerve root encroachment. IMPRESSION: 1. Acute/subacute mild superior endplate compression fractures at L3 and L4 with associated bone marrow edema, but no osseous retropulsion. 2. 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 3. Borderline spinal stenosis at L4-5 secondary to facet hypertrophy and a  grade 1 anterolisthesis. 4. Distended urinary bladder with trabeculation, suggesting chronic  bladder outlet obstruction. Electronically Signed   By: Carey Bullocks M.D.   On: 10/26/2022 10:57                 ROS: All systems reviewed are negative as they relate to the chief complaint within the history of present illness.  Patient denies fevers or chills.  Assessment & Plan: Visit Diagnoses:  1. Lumbar radiculopathy     Plan: Barbara Patton is a 82 y.o. female who presents to the office to review MRI of the lumbar spine.  MRI was reviewed to her with pictures and models to demonstrate the pathology that she has ongoing.  She has planned injection with Dr. Alvester Morin in the coming 1 to 2 weeks.  She will plan to keep this appointment and follow-up with the office as needed.  No weakness on exam today.  Follow-Up Instructions: No follow-ups on file.   Orders:  No orders of the defined types were placed in this encounter.  No orders of the defined types were placed in this encounter.     Procedures: No procedures performed   Clinical Data: No additional findings.  Objective: Vital Signs: There were no vitals taken for this visit.  Physical Exam:  Constitutional: Patient appears well-developed HEENT:  Head: Normocephalic Eyes:EOM are normal Neck: Normal range of motion Cardiovascular: Normal rate Pulmonary/chest: Effort normal Neurologic: Patient is alert Skin: Skin is warm Psychiatric: Patient has normal mood and affect  Ortho Exam: Ortho exam demonstrates no significant tenderness throughout the low back but she does have some tenderness over the right SI joint and is not present on the left.  She has increased pain with flexion and extension of the spine.  Intact hip flexion, quadricep, hamstring, dorsiflexion, plantarflexion, EHL strength rated 5/5.  No clonus bilaterally.  Specialty Comments:  EXAM: MRI LUMBAR SPINE WITHOUT CONTRAST   TECHNIQUE: Multiplanar, multisequence MR imaging of the lumbar spine was performed. No intravenous contrast was  administered.   COMPARISON:  Radiographs 10/01/2022 and 07/31/2022.  MRI 12/04/2019.   FINDINGS: Segmentation: Conventional anatomy assumed, with the last open disc space designated L5-S1.Concordant with prior imaging.   Alignment: Grade 1 anterolisthesis at L3-4 and L4-5, similar to previous MRI.   Vertebrae: There are mild superior endplate compression fractures at the L3 and L4 levels, resulting in approximately 10% loss of vertebral body height. There is associated bone marrow edema, but no osseous retropulsion or definite involvement of the posterior elements. These fractures are not clearly seen on recent or prior radiographs. No other acute osseous findings. The visualized sacroiliac joints appear unremarkable.   Conus medullaris: Extends to the L1-2 level and appears normal.   Paraspinal and other soft tissues: No acute paraspinal findings. The urinary bladder is noted to be distended and moderately trabeculated. 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   Disc levels:   Sagittal images demonstrate no significant disc space findings within the visualized lower thoracic spine. There is mild disc bulging at T11-12 and T12-L1.   L1-2: Preserved disc height. Mild facet hypertrophy. No spinal stenosis or nerve root encroachment.   L2-3: Preserved disc height with mild disc bulging and mild-to-moderate facet hypertrophy. No spinal stenosis or nerve root encroachment.   L3-4: Moderate to severe facet hypertrophy accounts for the grade 1 anterolisthesis and contributes to annular disc bulging and uncovering. There is resulting moderate multifactorial spinal stenosis with mild lateral recess and minimal foraminal narrowing  bilaterally. The spinal stenosis has progressed from the previous MRI   L4-5: Moderate to severe facet hypertrophy accounts for the grade 1 anterolisthesis and contributes to annular disc bulging and uncovering. There is only resulting  borderline spinal stenosis at this level with minimal left lateral recess and left foraminal narrowing. No nerve root encroachment. No significant changes are seen at this level.   L5-S1: Chronic disc bulging with endplate osteophytes asymmetric to the left. Mild facet hypertrophy. No spinal stenosis or nerve root encroachment.   IMPRESSION: 1. Acute/subacute mild superior endplate compression fractures at L3 and L4 with associated bone marrow edema, but no osseous retropulsion. 2. 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 3. Borderline spinal stenosis at L4-5 secondary to facet hypertrophy and a grade 1 anterolisthesis. 4. Distended urinary bladder with trabeculation, suggesting chronic bladder outlet obstruction.     Electronically Signed   By: Carey Bullocks M.D.   On: 10/26/2022 10:57  Imaging: No results found.   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   Allergic rhinitis 07/26/2015   Anxiety 07/26/2015   Benign essential hypertension 07/26/2015   Dysfunction of left eustachian tube 07/26/2015   Esophageal reflux 07/26/2015   Fibromyalgia 07/26/2015   Pure hypercholesterolemia 07/26/2015   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 05/03/2014   Pes anserinus bursitis of left knee 12/04/2013   Trochanteric bursitis of left hip 08/07/2013   Leg swelling 12/06/2011   Glaucoma  11/02/2011   HLD (hyperlipidemia) 11/02/2011   HTN (hypertension) 11/02/2011   Hypothyroid 11/02/2011   OA (osteoarthritis) 11/02/2011   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) 03/16/2011   Presence of artificial knee joint 03/16/2011   Past Medical History:  Diagnosis Date   Glaucoma    High cholesterol    Hypertension    Lupus (HCC)    Thyroid disease     Family History  Problem Relation Age of Onset   Cancer Father    Cancer Brother    Cancer Brother    Stroke Brother     Past Surgical History:  Procedure Laterality Date   ABDOMINAL HYSTERECTOMY     CATARACT EXTRACTION, BILATERAL     TOTAL KNEE ARTHROPLASTY Bilateral    TUBAL LIGATION     Social History   Occupational History   Not on file  Tobacco Use   Smoking status: Former    Types: Cigarettes    Quit date: 1997    Years since quitting: 27.4   Smokeless tobacco: Never  Vaping Use   Vaping Use: Never used  Substance and Sexual Activity   Alcohol use: Yes    Alcohol/week: 3.0 standard drinks of alcohol    Types: 3 Glasses of wine per week    Comment: occ wine   Drug use: No   Sexual activity: Not on file

## 2022-11-13 ENCOUNTER — Ambulatory Visit (INDEPENDENT_AMBULATORY_CARE_PROVIDER_SITE_OTHER): Payer: Medicare PPO | Admitting: Podiatry

## 2022-11-13 ENCOUNTER — Telehealth: Payer: Self-pay | Admitting: Podiatry

## 2022-11-13 ENCOUNTER — Ambulatory Visit (INDEPENDENT_AMBULATORY_CARE_PROVIDER_SITE_OTHER): Payer: Medicare PPO

## 2022-11-13 ENCOUNTER — Encounter: Payer: Self-pay | Admitting: Podiatry

## 2022-11-13 DIAGNOSIS — S90222A Contusion of left lesser toe(s) with damage to nail, initial encounter: Secondary | ICD-10-CM

## 2022-11-13 NOTE — Telephone Encounter (Signed)
Pt left message stating she thought she was to get instructions on how to soak foot when she left today but there was none on the paperwork. She does not remember everything that was said..  Talked with Dr Charlsie Merles and he said there were no instructions that I could print but to soak foot with the bandage to get it wet in epson salt and if out and about to use a little neosporin and a bandaid.   She seemed to understand and will call if needed.

## 2022-11-13 NOTE — Progress Notes (Signed)
Subjective:   Patient ID: Barbara Patton, female   DOB: 82 y.o.   MRN: [id number]   HPI Patient states she damaged her left big toe when she dropped a big bottle on it and it has a big abrasion she is scared about fracture   ROS      Objective:  Physical Exam  Neurovascular status intact with an abrasion of the left dorsal proximal phalanx measuring about 1.7 cm x 1.2 cm localized no subcutaneous tendon or bone exposure.  Patient is on antibiotics no proximal erythema edema or drainage noted     Assessment:  Significant abrasion of the left dorsal hallux secondary to injury     Plan:  H&P x-ray reviewed and I went ahead today and I have recommended wet-to-dry dressings soaks open toed shoes continued antibiotics.  If symptoms get worse patient will need to be seen back and education given concerning what would constitute that.  Should heal uneventfully  X-rays do not indicate fracture appears to be soft tissue contusion of the left big toe

## 2022-11-15 ENCOUNTER — Ambulatory Visit (INDEPENDENT_AMBULATORY_CARE_PROVIDER_SITE_OTHER): Payer: Medicare PPO | Admitting: Physical Medicine and Rehabilitation

## 2022-11-15 ENCOUNTER — Other Ambulatory Visit: Payer: Self-pay

## 2022-11-15 VITALS — BP 149/80 | HR 78

## 2022-11-15 DIAGNOSIS — M5416 Radiculopathy, lumbar region: Secondary | ICD-10-CM

## 2022-11-15 MED ORDER — METHYLPREDNISOLONE ACETATE 80 MG/ML IJ SUSP
80.0000 mg | Freq: Once | INTRAMUSCULAR | Status: AC
Start: 2022-11-15 — End: 2022-11-15
  Administered 2022-11-15: 80 mg

## 2022-11-15 NOTE — Progress Notes (Signed)
Functional Pain Scale - descriptive words and definitions  Moderate (4)   Constantly aware of pain, can complete ADLs with modification/sleep marginally affected at times/passive distraction is of no use, but active distraction gives some relief. Moderate range order  Average Pain 5   +Driver, -BT, -Dye Allergies.  Lower back pain on both sides that can radiate all the way down the right leg

## 2022-11-15 NOTE — Patient Instructions (Signed)

## 2022-11-20 ENCOUNTER — Ambulatory Visit (INDEPENDENT_AMBULATORY_CARE_PROVIDER_SITE_OTHER): Payer: Medicare PPO | Admitting: Physical Therapy

## 2022-11-20 ENCOUNTER — Encounter: Payer: Self-pay | Admitting: Physical Therapy

## 2022-11-20 DIAGNOSIS — M542 Cervicalgia: Secondary | ICD-10-CM | POA: Diagnosis not present

## 2022-11-20 DIAGNOSIS — M545 Low back pain, unspecified: Secondary | ICD-10-CM

## 2022-11-20 DIAGNOSIS — G8929 Other chronic pain: Secondary | ICD-10-CM

## 2022-11-20 DIAGNOSIS — M5459 Other low back pain: Secondary | ICD-10-CM | POA: Diagnosis not present

## 2022-11-20 DIAGNOSIS — R29898 Other symptoms and signs involving the musculoskeletal system: Secondary | ICD-10-CM

## 2022-11-20 NOTE — Therapy (Signed)
OUTPATIENT PHYSICAL THERAPY LOWER EXTREMITY TREATMENT   Patient Name: Barbara Patton MRN: [id number] DOB:1940-07-22, 82 y.o., female Today's Date: 11/20/2022  END OF SESSION:  PT End of Session - 11/20/22 1031     Visit Number 6    Number of Visits 13    Date for PT Re-Evaluation 11/24/22    Authorization Type [employer] MCR/GEHA    Authorization Time Period 10/13/22 to 11/24/22    Progress Note Due on Visit 10    PT Start Time 1020    PT Stop Time 1100    PT Time Calculation (min) 40 min    Activity Tolerance Patient tolerated treatment well    Behavior During Therapy [facility] for tasks assessed/performed                 Past Medical History:  Diagnosis Date   Glaucoma    High cholesterol    Hypertension    Lupus (HCC)    Thyroid disease    Past Surgical History:  Procedure Laterality Date   ABDOMINAL HYSTERECTOMY     CATARACT EXTRACTION, BILATERAL     TOTAL KNEE ARTHROPLASTY Bilateral    TUBAL LIGATION     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   Referred otalgia of left ear 08/23/2015   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   Allergic rhinitis 07/26/2015   Anxiety 07/26/2015   Benign essential hypertension 07/26/2015   Dysfunction of left eustachian tube 07/26/2015   Esophageal reflux 07/26/2015   Fibromyalgia 07/26/2015   Pure hypercholesterolemia 07/26/2015   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 05/03/2014   Pes anserinus bursitis of left knee 12/04/2013   Trochanteric bursitis of left hip 08/07/2013    Leg swelling 12/06/2011   Glaucoma 11/02/2011   HLD (hyperlipidemia) 11/02/2011   HTN (hypertension) 11/02/2011   Hypothyroid 11/02/2011   OA (osteoarthritis) 11/02/2011   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 Gait training Balance Fall Prevention THERAPY DIAG:  Other low back pain  Cervicalgia  Chronic left-sided low back pain, unspecified whether sciatica present  Other symptoms and signs involving the musculoskeletal system  Chronic midline low back pain without sciatica  Rationale for Evaluation and Treatment: Rehabilitation  ONSET DATE: chronic "its been awhile"   SUBJECTIVE:   SUBJECTIVE STATEMENT: She relays she had back injections on Wednesday last week and by Saturday the pain was back.   PERTINENT HISTORY: Anemia, tremor, B sensorineural hearing loss, TMJ pain, HTN, fibromyalgia, HLD, TKR + revision, anxiety, trochanteric bursitis, glaucoma, hypothyroidism, OA PAIN:  Are you having pain? Yes: NPRS scale: 0 at rest and up to10 with standing activity /10 Pain location: right hip and leg  Pain description: ache  Aggravating factors: "nothing just starts hurting", starts hurting in standing  Relieving factors: CBD cream  PRECAUTIONS: Back due to acute/subacute  compression fractures found on MRI 10/19/22  WEIGHT BEARING RESTRICTIONS: No  FALLS:  Has patient fallen in last 6 months? Yes. Number of falls 2- first fall was at 2am, fell trying to pick a candy wrapper off the floor; second fall fell forward trying to get off of commode. FOF +   LIVING ENVIRONMENT: Lives with: lives with their family Lives in: House/apartment Stairs: 1 STE, no steps inside  Has following equipment at home: Single point cane and Environmental consultant  - 2 wheeled  OCCUPATION: retired   PLOF: Independent, Independent with basic ADLs, Independent with household mobility with device, Independent with gait, and Independent with transfers  PATIENT GOALS: "to help my back and R LE I don't know if PT will help or"   NEXT MD VISIT: None scheduled with referring   OBJECTIVE:   DIAGNOSTIC FINDINGS:   FINDINGS: No evidence for an acute fracture in the bony pelvis. SI joints and symphysis pubis unremarkable. Joint space in the right hip is relatively well preserved. There is loss of joint space in the left hip with degenerative spurring. No evidence for left femoral neck fracture.   IMPRESSION: 1. No acute bony findings. 2. Degenerative changes in the left hip.  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 Chronic superior endplate fractures at L1 and L3. Extensive atheromatous calcification of the aorta and iliacs.   IMPRESSION: 1. No acute finding. 2. 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  THORACIC SPINE 2 VIEWS   COMPARISON:  None Available.   FINDINGS: No acute fracture or traumatic malalignment. Degenerative disc narrowing with endplate spurring in the mid and lower thoracic spine. Maintained posterior mediastinal fat planes. Mild thoracic levocurvature.   IMPRESSION: Degenerative changes without acute or focal finding.  Three-view radiographs of her right knee were reviewed today she is status  post total right knee arthroplasty components remain in stable position  when compared to previous x-rays.  No evidence of any periprosthetic  fracture    EXAM: MRI LUMBAR SPINE WITHOUT CONTRAST   TECHNIQUE: Multiplanar, multisequence MR imaging of the lumbar spine was performed. No intravenous contrast was administered.   COMPARISON:  Radiographs 10/01/2022 and 07/31/2022.  MRI 12/04/2019.    FINDINGS: Segmentation: Conventional anatomy assumed, with the last open disc space designated L5-S1.Concordant with prior imaging.   Alignment: Grade 1 anterolisthesis at L3-4 and L4-5, similar to previous MRI.   Vertebrae: There are mild superior endplate compression fractures at the L3 and L4 levels, resulting in approximately 10% loss of vertebral body height. There is associated bone marrow edema, but no osseous retropulsion or definite involvement of the posterior elements. These fractures are not clearly seen on recent or prior radiographs. No other acute osseous findings. The visualized sacroiliac joints appear unremarkable.   Conus medullaris: Extends to the L1-2 level and appears normal.   Paraspinal and other soft tissues: No acute paraspinal findings. The urinary bladder is noted to be distended and moderately trabeculated. 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   Disc levels:   Sagittal images demonstrate no significant disc space findings within the visualized lower thoracic spine. There is mild disc bulging at T11-12 and T12-L1.   L1-2: Preserved disc height. Mild facet hypertrophy. No spinal stenosis or nerve root encroachment.   L2-3: Preserved disc height with mild disc bulging and mild-to-moderate facet hypertrophy. No spinal stenosis or nerve root encroachment.   L3-4: Moderate to severe facet hypertrophy accounts for the  grade 1 anterolisthesis and contributes to annular disc bulging and uncovering. There is resulting moderate multifactorial spinal stenosis with mild lateral recess and minimal foraminal narrowing bilaterally. The spinal stenosis has progressed from the previous MRI   L4-5: Moderate to severe facet hypertrophy accounts for the grade 1 anterolisthesis and contributes to annular disc bulging and uncovering. There is only resulting borderline spinal stenosis at this level with minimal left lateral recess and left  foraminal narrowing. No nerve root encroachment. No significant changes are seen at this level.   L5-S1: Chronic disc bulging with endplate osteophytes asymmetric to the left. Mild facet hypertrophy. No spinal stenosis or nerve root encroachment.   IMPRESSION: 1. Acute/subacute mild superior endplate compression fractures at L3 and L4 with associated bone marrow edema, but no osseous retropulsion. 2. 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 3. Borderline spinal stenosis at L4-5 secondary to facet hypertrophy and a grade 1 anterolisthesis. 4. Distended urinary bladder with trabeculation, suggesting chronic bladder outlet obstruction.      PATIENT SURVEYS:  FOTO 49, predicted 53 in 13 visits   COGNITION: Overall cognitive status: Within functional limits for tasks assessed     SENSATION: "My feet feel numb all the time but not my legs, like up under the bottoms"     POSTURE: rounded shoulders, forward head, decreased lumbar lordosis, increased thoracic kyphosis, and flexed trunk   MUSCLE LENGTH  Piriformis severe limitation B; 11/03/22  R moderate limitation/L severe limitation  HS R moderate limitation, severe limitation L; 11/03/22 R mild limitation/L severe limitation    LUMBAR ROM  Lumbar flexion severe limitation  Lumbar extension mild limitation  Lumbar lateral flexion moderate limitation  B Lumbar rotation moderate limitation B       LOWER EXTREMITY MMT:  MMT Right eval Left eval  Hip flexion 3 3-  Hip extension    Hip abduction 3- 3-  Hip adduction    Hip internal rotation    Hip external rotation    Knee flexion 4+ 4+  Knee extension 4+ 4+  Ankle dorsiflexion 5 5  Ankle plantarflexion    Ankle inversion    Ankle eversion     (Blank rows = not tested)    FUNCTIONAL TESTS:  5 times sit to stand: 30 seconds no UEs from standard chair Timed up and go (TUG): 22.5 seconds SPC                      Gait speed 1.9ft/second with SPC   GAIT: Distance walked: 18ft Assistive device utilized: Single point cane Level of assistance: Modified independence Comments: flexed at hips, limited step lengths/stance times, slightly unsteady even with SPC    TODAY'S TREATMENT:                                                                                                                              DATE:  11/20/22  TherEx  Nustep L5 x6 minutes BLEs only  Seated lumbar flexion stretch 10 sec X 10 Supine Single knee to chest stretch 10 sec X 5 bilat Supine low trunk rotations 10 sec X 5 bilat Supine piriformis stretch 10 sec X 5 bilat Bridges X 15 Supine SLR X10 bilat Standing hip ABD red TB x10 B Standing hip extensions x10 B red TB  Leg press 75# DL 1O10 Standing lumbar extensions X 10  11/08/22  TherEx  Nustep L5 x7 minutes BLEs only  Supine Single knee to chest stretch 10 sec X 5 bilat Supine low trunk rotations 10 sec X 5 bilat Supine piriformis stretch 20 sec X 3 bilat Bridges X 15 Supine clams red TB 2x10 Standing hip ABD red TB x10 B Standing hip extensions x10 B red TB    PATIENT EDUCATION:  Education details: exam findings, POC,  benefits of water therapy  Person educated: Patient Education method: Explanation, Demonstration, and Handouts Education comprehension: verbalized understanding, returned demonstration, and needs further education  HOME EXERCISE PROGRAM:  Access Code: [email or web address] URL: [email or web address] Date: 10/27/2022 Prepared by: Nedra Hai  Exercises - Supine Bridge  - 1 x daily - 7 x weekly - 1 sets - 10 reps - 2 hold - Supine Posterior Pelvic Tilt  - 1 x daily - 7 x weekly - 1 sets - 10 reps - 3 hold - Supine Figure 4 Piriformis Stretch  - 1 x daily - 7 x weekly - 1 sets - 3 reps - 30 hold - Hooklying Hamstring Stretch with Strap  - 1 x daily - 7 x weekly - 1 sets - 3 reps - 30 hold  ASSESSMENT:  CLINICAL  IMPRESSION: Continued with strength progressions as tolerated, she did well with this but did express some pain complaints with supine SLR exercise. We will continue to work to improve her strength to see if this will help with pain and function.    OBJECTIVE IMPAIRMENTS: Abnormal gait, decreased activity tolerance, decreased balance, decreased knowledge of use of DME, decreased mobility, difficulty walking, decreased ROM, decreased strength, hypomobility, increased fascial restrictions, impaired flexibility, impaired sensation, postural dysfunction, obesity, and pain.   ACTIVITY LIMITATIONS: standing, stairs, transfers, bed mobility, and locomotion level  PARTICIPATION LIMITATIONS: shopping, community activity, and yard work  PERSONAL FACTORS: Age, Behavior pattern, Fitness, Past/current experiences, Social background, and Time since onset of injury/illness/exacerbation are also affecting patient's functional outcome.   REHAB POTENTIAL: Fair chronicity of impairments, fibromyalgia, increased pain with activity at eval   CLINICAL DECISION MAKING: Stable/uncomplicated  EVALUATION COMPLEXITY: Low   GOALS: Goals reviewed with patient? No  SHORT TERM GOALS: Target date: 11/03/22   Will be compliant with appropriate progressive HEP  Baseline: Goal status: IN PROGRESS 11/03/22- mixed compliance, frequency unclear per pt description    2.  Pain to be no more than 7/10 at worst  Baseline:  Goal status: IN PROGRESS 11/03/22- 8/10 at worst   3.  Hamstring and piriformis flexibility to have improved by 50% to assist in improving mobility and reducing pain  Baseline:  Goal status: IN PROGRESS 11/03/22 see above   4.  Lumbar AROM to have improved by 50% all directions in order to assist in improving mobility/reducing pain  Baseline:  Goal status: DEFERRED 11/03/22 due to new findings of acute/subacute  lumbar compression fractures    LONG TERM GOALS: Target date: 11/24/22    MMT to have  improved by 1 grade in all weak groups  Baseline:  Goal status: INITIAL  2.  Will complete 5x STS test in 15 seconds or less no UEs  Baseline:  Goal status: INITIAL  3.  Will complete TUG test in 13 seconds or less with LRAD to show improved functional balance  Baseline:  Goal status: INITIAL  4.  Pain to be no more than 5/10 at worst  Baseline:  Goal status: INITIAL  5.  FOTO score to improved to at least predicted value  Baseline:  Goal status: INITIAL   PLAN:  PT FREQUENCY: 2x/week  PT DURATION: 6 weeks  PLANNED INTERVENTIONS: Therapeutic exercises, Therapeutic activity, Neuromuscular re-education, Gait training, Self Care, Aquatic Therapy, Manual therapy, and Re-evaluation  PLAN FOR NEXT SESSION: update measurements and goals next visit.   Ivery Quale, PT, DPT 11/20/22 10:33 AM    Referring diagnosis?  M25.561,G89.29 (ICD-10-CM) - Chronic pain of right knee   Treatment diagnosis? (if different than referring diagnosis)   Muscle weakness (generalized) M62.81  Abnormal posture R29.3  Difficulty in walking, not elsewhere classified R26.2  Pain in right leg M79.604  Unsteadiness on feet R26.81  Other low back pain M54.59  What was this (referring dx) caused by? []  Surgery [x]  Fall [x]  Ongoing issue []  Arthritis []  Other: ____________  Laterality: [x]  Rt []  Lt []  Both  Check all possible CPT codes:  *CHOOSE 10 OR LESS*    []  81191 (Therapeutic Exercise)  []  92507 (SLP Treatment)  []  97112 (Neuro Re-ed)   []  92526 (Swallowing Treatment)   []  97116 (Gait Training)   []  [id number] (Cognitive Training, 1st 15 minutes) []  97140 (Manual Therapy)   []  97130 (Cognitive Training, each add'l 15 minutes)  []  97164 (Re-evaluation)                              []  Other, List CPT Code ____________  []  97530 (Therapeutic Activities)     []  97535 (Self Care)   [x]  All codes above (97110 - 97535)  []  97012 (Mechanical Traction)  []  97014 (E-stim Unattended)  []   97032 (E-stim manual)  []  97033 (Ionto)  []  97035 (Ultrasound) []  97750 (Physical Performance Training) [x]  [id number] (Aquatic Therapy) []  97016 (Vasopneumatic Device) []  [id number] (Paraffin) []  97034 (Contrast Bath) []  97597 (Wound Care 1st 20 sq cm) []  97598 (Wound Care each add'l 20 sq cm) []  97760 (Orthotic Fabrication, Fitting, Training Initial) []  [id number] (Prosthetic Management and Training Initial) []  [id number] (Orthotic or Prosthetic Training/ Modification Subsequent)

## 2022-11-22 ENCOUNTER — Encounter: Payer: Self-pay | Admitting: Physical Therapy

## 2022-11-22 ENCOUNTER — Ambulatory Visit (INDEPENDENT_AMBULATORY_CARE_PROVIDER_SITE_OTHER): Payer: Medicare PPO | Admitting: Physical Therapy

## 2022-11-22 DIAGNOSIS — R2681 Unsteadiness on feet: Secondary | ICD-10-CM | POA: Diagnosis not present

## 2022-11-22 DIAGNOSIS — M5459 Other low back pain: Secondary | ICD-10-CM

## 2022-11-22 DIAGNOSIS — M6281 Muscle weakness (generalized): Secondary | ICD-10-CM

## 2022-11-22 DIAGNOSIS — R293 Abnormal posture: Secondary | ICD-10-CM

## 2022-11-22 DIAGNOSIS — R262 Difficulty in walking, not elsewhere classified: Secondary | ICD-10-CM

## 2022-11-22 NOTE — Therapy (Signed)
OUTPATIENT PHYSICAL THERAPY LOWER EXTREMITY TREATMENT   Patient Name: Barbara Patton MRN: [id number] DOB:[birth year], 82 y.o., female Today's Date: 11/22/2022  END OF SESSION:  PT End of Session - 11/22/22 1028     Visit Number 7    Number of Visits 13    Date for PT Re-Evaluation 11/24/22    Authorization Type [employer] MCR/GEHA    Authorization Time Period 10/13/22 to 11/24/22    Progress Note Due on Visit 10    PT Start Time 1023    PT Stop Time 1101    PT Time Calculation (min) 38 min    Activity Tolerance Patient tolerated treatment well    Behavior During Therapy [facility] for tasks assessed/performed                  Past Medical History:  Diagnosis Date   Glaucoma    High cholesterol    Hypertension    Lupus (HCC)    Thyroid disease    Past Surgical History:  Procedure Laterality Date   ABDOMINAL HYSTERECTOMY     CATARACT EXTRACTION, BILATERAL     TOTAL KNEE ARTHROPLASTY Bilateral    TUBAL LIGATION     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   Referred otalgia of left ear 08/23/2015   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   Allergic rhinitis 07/26/2015   Anxiety 07/26/2015   Benign essential hypertension 07/26/2015   Dysfunction of left eustachian tube 07/26/2015   Esophageal reflux 07/26/2015   Fibromyalgia 07/26/2015   Pure hypercholesterolemia 07/26/2015   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 05/03/2014   Pes anserinus bursitis of left knee 12/04/2013   Trochanteric bursitis of left hip 08/07/2013    Leg swelling 12/06/2011   Glaucoma 11/02/2011   HLD (hyperlipidemia) 11/02/2011   HTN (hypertension) 11/02/2011   Hypothyroid 11/02/2011   OA (osteoarthritis) 11/02/2011   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 Gait training Balance Fall Prevention THERAPY DIAG:  Other low back pain  Muscle weakness (generalized)  Abnormal posture  Difficulty in walking, not elsewhere classified  Unsteadiness on feet  Rationale for Evaluation and Treatment: Rehabilitation  ONSET DATE: chronic "its been awhile"   SUBJECTIVE:   SUBJECTIVE STATEMENT: She relays she feels she is walking better overall, her back pain is not too bad today  PERTINENT HISTORY: Anemia, tremor, B sensorineural hearing loss, TMJ pain, HTN, fibromyalgia, HLD, TKR + revision, anxiety, trochanteric bursitis, glaucoma, hypothyroidism, OA PAIN:  Are you having pain? Yes: NPRS scale:  5 today overall /10 Pain location: right hip and leg  Pain description: ache  Aggravating factors: "nothing just starts hurting", starts hurting in standing  Relieving factors: CBD cream  PRECAUTIONS: Back due to acute/subacute compression fractures found on MRI 10/19/22  WEIGHT BEARING RESTRICTIONS: No  FALLS:  Has patient  fallen in last 6 months? Yes. Number of falls 2- first fall was at 2am, fell trying to pick a candy wrapper off the floor; second fall fell forward trying to get off of commode. FOF +   LIVING ENVIRONMENT: Lives with: lives with their family Lives in: House/apartment Stairs: 1 STE, no steps inside  Has following equipment at home: Single point cane and Environmental consultant - 2 wheeled  OCCUPATION: retired   PLOF: Independent, Independent with basic ADLs, Independent with  household mobility with device, Independent with gait, and Independent with transfers  PATIENT GOALS: "to help my back and R LE I don't know if PT will help or"   NEXT MD VISIT: None scheduled with referring   OBJECTIVE:   DIAGNOSTIC FINDINGS:   FINDINGS: No evidence for an acute fracture in the bony pelvis. SI joints and symphysis pubis unremarkable. Joint space in the right hip is relatively well preserved. There is loss of joint space in the left hip with degenerative spurring. No evidence for left femoral neck fracture.   IMPRESSION: 1. No acute bony findings. 2. Degenerative changes in the left hip.  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 Chronic superior endplate fractures at L1 and L3. Extensive atheromatous calcification of the aorta and iliacs.   IMPRESSION: 1. No acute finding. 2. 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  THORACIC SPINE 2 VIEWS   COMPARISON:  None Available.   FINDINGS: No acute fracture or traumatic malalignment. Degenerative disc narrowing with endplate spurring in the mid and lower thoracic spine. Maintained posterior mediastinal fat planes. Mild thoracic levocurvature.   IMPRESSION: Degenerative changes without acute or focal finding.  Three-view radiographs of her right knee were reviewed today she is status  post total right knee arthroplasty components remain in stable position  when compared to previous x-rays.  No evidence of any periprosthetic  fracture    EXAM: MRI LUMBAR SPINE WITHOUT CONTRAST   TECHNIQUE: Multiplanar, multisequence MR imaging of the lumbar spine was performed. No intravenous contrast was administered.   COMPARISON:  Radiographs 10/01/2022 and 07/31/2022.  MRI 12/04/2019.   FINDINGS: Segmentation: Conventional anatomy assumed, with the last open disc space designated L5-S1.Concordant with  prior imaging.   Alignment: Grade 1 anterolisthesis at L3-4 and L4-5, similar to previous MRI.   Vertebrae: There are mild superior endplate compression fractures at the L3 and L4 levels, resulting in approximately 10% loss of vertebral body height. There is associated bone marrow edema, but no osseous retropulsion or definite involvement of the posterior elements. These fractures are not clearly seen on recent or prior radiographs. No other acute osseous findings. The visualized sacroiliac joints appear unremarkable.   Conus medullaris: Extends to the L1-2 level and appears normal.   Paraspinal and other soft tissues: No acute paraspinal findings. The urinary bladder is noted to be distended and moderately trabeculated. 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   Disc levels:   Sagittal images demonstrate no significant disc space findings within the visualized lower thoracic spine. There is mild disc bulging at T11-12 and T12-L1.   L1-2: Preserved disc height. Mild facet hypertrophy. No spinal stenosis or nerve root encroachment.   L2-3: Preserved disc height with mild disc bulging and mild-to-moderate facet hypertrophy. No spinal stenosis or nerve root encroachment.   L3-4: Moderate to severe facet hypertrophy accounts for the grade 1 anterolisthesis and contributes to annular disc bulging and uncovering. There is resulting moderate multifactorial  spinal stenosis with mild lateral recess and minimal foraminal narrowing bilaterally. The spinal stenosis has progressed from the previous MRI   L4-5: Moderate to severe facet hypertrophy accounts for the grade 1 anterolisthesis and contributes to annular disc bulging and uncovering. There is only resulting borderline spinal stenosis at this level with minimal left lateral recess and left foraminal narrowing. No nerve root encroachment. No significant changes are seen at this level.   L5-S1: Chronic disc bulging  with endplate osteophytes asymmetric to the left. Mild facet hypertrophy. No spinal stenosis or nerve root encroachment.   IMPRESSION: 1. Acute/subacute mild superior endplate compression fractures at L3 and L4 with associated bone marrow edema, but no osseous retropulsion. 2. 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 3. Borderline spinal stenosis at L4-5 secondary to facet hypertrophy and a grade 1 anterolisthesis. 4. Distended urinary bladder with trabeculation, suggesting chronic bladder outlet obstruction.      PATIENT SURVEYS:  FOTO 49, predicted 53 in 13 visits   COGNITION: Overall cognitive status: Within functional limits for tasks assessed     SENSATION: "My feet feel numb all the time but not my legs, like up under the bottoms"     POSTURE: rounded shoulders, forward head, decreased lumbar lordosis, increased thoracic kyphosis, and flexed trunk   MUSCLE LENGTH  Piriformis severe limitation B; 11/03/22  R moderate limitation/L severe limitation  HS R moderate limitation, severe limitation L; 11/03/22 R mild limitation/L severe limitation    LUMBAR ROM  Lumbar flexion severe limitation  Lumbar extension mild limitation  Lumbar lateral flexion moderate limitation  B Lumbar rotation moderate limitation B       LOWER EXTREMITY MMT:  MMT Right eval Left eval  Hip flexion 3 3-  Hip extension    Hip abduction 3- 3-  Hip adduction    Hip internal rotation    Hip external rotation    Knee flexion 4+ 4+  Knee extension 4+ 4+  Ankle dorsiflexion 5 5  Ankle plantarflexion    Ankle inversion    Ankle eversion     (Blank rows = not tested)    FUNCTIONAL TESTS:  5 times sit to stand: 30 seconds no UEs from standard chair Timed up and go (TUG): 22.5 seconds SPC                     Gait speed 1.67ft/second with SPC   GAIT: Distance walked: 56ft Assistive device utilized: Single point cane Level of  assistance: Modified independence Comments: flexed at hips, limited step lengths/stance times, slightly unsteady even with SPC    TODAY'S TREATMENT:                                                                                                                              DATE:  11/22/22  TherEx  Nustep L5 x8 minutes BLEs only  Seated lumbar flexion stretch 10 sec X 10  Seated pball isometric shoulder extension core activation 5 sec X 10 Seated pball isometric hip flexion core activation 5 sec X 10 Supine Single knee to chest stretch 10 sec X 5 bilat Supine low trunk rotations 10 sec X 5 bilat Supine piriformis stretch 10 sec X 5 bilat Bridges X 15 Standing hip ABD red TB x10 B Standing hip extensions x10 B red TB  Leg press 75# [id number] Standing rows and extensions green 2X12 each Standing lumbar extensions X 10  11/20/22  TherEx  Nustep L5 x6 minutes BLEs only  Seated lumbar flexion stretch 10 sec X 10 Supine Single knee to chest stretch 10 sec X 5 bilat Supine low trunk rotations 10 sec X 5 bilat Supine piriformis stretch 10 sec X 5 bilat Bridges X 15 Supine SLR X10 bilat Standing hip ABD red TB x10 B Standing hip extensions x10 B red TB  Leg press 75# DL [id number] Standing lumbar extensions X 10    PATIENT EDUCATION:  Education details: exam findings, POC,  benefits of water therapy  Person educated: Patient Education method: Explanation, Demonstration, and Handouts Education comprehension: verbalized understanding, returned demonstration, and needs further education  HOME EXERCISE PROGRAM:  Access Code: [email or web address] URL: [email or web address] Date: 10/27/2022 Prepared by: Nedra Hai  Exercises - Supine Bridge  - 1 x daily - 7 x weekly - 1 sets - 10 reps - 2 hold - Supine Posterior Pelvic Tilt  - 1 x daily - 7 x weekly - 1 sets - 10 reps - 3 hold - Supine Figure 4 Piriformis Stretch  - 1 x daily - 7 x weekly - 1 sets - 3 reps - 30 hold - Hooklying  Hamstring Stretch with Strap  - 1 x daily - 7 x weekly - 1 sets - 3 reps - 30 hold  ASSESSMENT:  CLINICAL IMPRESSION: She does relay some improvement in overall back pain and has been overall more active. We will continue to work to improve her strength and mobility to improve function and manage pain.   OBJECTIVE IMPAIRMENTS: Abnormal gait, decreased activity tolerance, decreased balance, decreased knowledge of use of DME, decreased mobility, difficulty walking, decreased ROM, decreased strength, hypomobility, increased fascial restrictions, impaired flexibility, impaired sensation, postural dysfunction, obesity, and pain.   ACTIVITY LIMITATIONS: standing, stairs, transfers, bed mobility, and locomotion level  PARTICIPATION LIMITATIONS: shopping, community activity, and yard work  PERSONAL FACTORS: Age, Behavior pattern, Fitness, Past/current experiences, Social background, and Time since onset of injury/illness/exacerbation are also affecting patient's functional outcome.   REHAB POTENTIAL: Fair chronicity of impairments, fibromyalgia, increased pain with activity at eval   CLINICAL DECISION MAKING: Stable/uncomplicated  EVALUATION COMPLEXITY: Low   GOALS: Goals reviewed with patient? No  SHORT TERM GOALS: Target date: 11/03/22   Will be compliant with appropriate progressive HEP  Baseline: Goal status: IN PROGRESS 11/03/22- mixed compliance, frequency unclear per pt description    2.  Pain to be no more than 7/10 at worst  Baseline:  Goal status: IN PROGRESS 11/03/22- 8/10 at worst   3.  Hamstring and piriformis flexibility to have improved by 50% to assist in improving mobility and reducing pain  Baseline:  Goal status: IN PROGRESS 11/03/22 see above   4.  Lumbar AROM to have improved by 50% all directions in order to assist in improving mobility/reducing pain  Baseline:  Goal status: DEFERRED 11/03/22 due to new findings of acute/subacute  lumbar compression fractures     LONG TERM GOALS: Target date: 11/24/22  MMT to have improved by 1 grade in all weak groups  Baseline:  Goal status: INITIAL  2.  Will complete 5x STS test in 15 seconds or less no UEs  Baseline:  Goal status: INITIAL  3.  Will complete TUG test in 13 seconds or less with LRAD to show improved functional balance  Baseline:  Goal status: INITIAL  4.  Pain to be no more than 5/10 at worst  Baseline:  Goal status: INITIAL  5.  FOTO score to improved to at least predicted value  Baseline:  Goal status: INITIAL   PLAN:  PT FREQUENCY: 2x/week  PT DURATION: 6 weeks  PLANNED INTERVENTIONS: Therapeutic exercises, Therapeutic activity, Neuromuscular re-education, Gait training, Self Care, Aquatic Therapy, Manual therapy, and Re-evaluation  PLAN FOR NEXT SESSION: update measurements and goals next visit.   Ivery Quale, PT, DPT 11/22/22 10:28 AM    Referring diagnosis?  M25.561,G89.29 (ICD-10-CM) - Chronic pain of right knee   Treatment diagnosis? (if different than referring diagnosis)   Muscle weakness (generalized) M62.81  Abnormal posture R29.3  Difficulty in walking, not elsewhere classified R26.2  Pain in right leg M79.604  Unsteadiness on feet R26.81  Other low back pain M54.59  What was this (referring dx) caused by? []  Surgery [x]  Fall [x]  Ongoing issue []  Arthritis []  Other: ____________  Laterality: [x]  Rt []  Lt []  Both  Check all possible CPT codes:  *CHOOSE 10 OR LESS*    []  97110 (Therapeutic Exercise)  []  92507 (SLP Treatment)  []  97112 (Neuro Re-ed)   []  92526 (Swallowing Treatment)   []  97116 (Gait Training)   []  [id number] (Cognitive Training, 1st 15 minutes) []  97140 (Manual Therapy)   []  97130 (Cognitive Training, each add'l 15 minutes)  []  97164 (Re-evaluation)                              []  Other, List CPT Code ____________  []  97530 (Therapeutic Activities)     []  97535 (Self Care)   [x]  All codes above (97110 - 97535)  []   97012 (Mechanical Traction)  []  97014 (E-stim Unattended)  []  97032 (E-stim manual)  []  97033 (Ionto)  []  97035 (Ultrasound) []  97750 (Physical Performance Training) [x]  [id number] (Aquatic Therapy) []  97016 (Vasopneumatic Device) []  [id number] (Paraffin) []  97034 (Contrast Bath) []  97597 (Wound Care 1st 20 sq cm) []  97598 (Wound Care each add'l 20 sq cm) []  97760 (Orthotic Fabrication, Fitting, Training Initial) []  [id number] (Prosthetic Management and Training Initial) []  [id number] (Orthotic or Prosthetic Training/ Modification Subsequent)

## 2022-11-27 ENCOUNTER — Ambulatory Visit (INDEPENDENT_AMBULATORY_CARE_PROVIDER_SITE_OTHER): Payer: Medicare PPO | Admitting: Physical Therapy

## 2022-11-27 ENCOUNTER — Encounter: Payer: Self-pay | Admitting: Physical Therapy

## 2022-11-27 DIAGNOSIS — M6281 Muscle weakness (generalized): Secondary | ICD-10-CM

## 2022-11-27 DIAGNOSIS — R262 Difficulty in walking, not elsewhere classified: Secondary | ICD-10-CM

## 2022-11-27 DIAGNOSIS — R2681 Unsteadiness on feet: Secondary | ICD-10-CM

## 2022-11-27 DIAGNOSIS — M5459 Other low back pain: Secondary | ICD-10-CM

## 2022-11-27 DIAGNOSIS — R293 Abnormal posture: Secondary | ICD-10-CM

## 2022-11-27 NOTE — Therapy (Signed)
OUTPATIENT PHYSICAL THERAPY LOWER EXTREMITY TREATMENT/RECERT and [employer] Re-auth and progress note Progress Note reporting period date 10/13/22 to 11/27/22  See below for objective and subjective measurements relating to patients progress with PT.    Patient Name: Barbara Patton MRN: [id number] DOB:1941/02/17, 82 y.o., female Today's Date: 11/27/2022  END OF SESSION:  PT End of Session - 11/27/22 1030     Visit Number 8    Number of Visits 15    Date for PT Re-Evaluation 12/25/22    Authorization Type [employer] MCR/GEHA    Authorization Time Period 10/13/22 to 11/24/22 (did new reauth on 6/24)    Progress Note Due on Visit 18    PT Start Time 1025    PT Stop Time 1100    PT Time Calculation (min) 35 min    Activity Tolerance Patient tolerated treatment well    Behavior During Therapy [facility] for tasks assessed/performed                  Past Medical History:  Diagnosis Date   Glaucoma    High cholesterol    Hypertension    Lupus (HCC)    Thyroid disease    Past Surgical History:  Procedure Laterality Date   ABDOMINAL HYSTERECTOMY     CATARACT EXTRACTION, BILATERAL     TOTAL KNEE ARTHROPLASTY Bilateral    TUBAL LIGATION     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   Referred otalgia of left ear 08/23/2015   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   Allergic rhinitis 07/26/2015   Anxiety 07/26/2015   Benign essential hypertension 07/26/2015   Dysfunction of left eustachian tube 07/26/2015   Esophageal reflux 07/26/2015   Fibromyalgia 07/26/2015   Pure hypercholesterolemia 07/26/2015    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 05/03/2014   Pes anserinus bursitis of left knee 12/04/2013   Trochanteric bursitis of left hip 08/07/2013   Leg swelling 12/06/2011   Glaucoma 11/02/2011   HLD (hyperlipidemia) 11/02/2011   HTN (hypertension) 11/02/2011   Hypothyroid 11/02/2011   OA (osteoarthritis) 11/02/2011   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 Gait training Balance Fall Prevention THERAPY DIAG:  Other low back pain  Muscle weakness (generalized)  Abnormal posture  Difficulty in walking, not elsewhere classified  Unsteadiness on feet  Rationale for Evaluation and Treatment: Rehabilitation  ONSET DATE: chronic "its been awhile"   SUBJECTIVE:   SUBJECTIVE STATEMENT: She thought her appt was at 10:30 so she arrives late for 10:15 appointment. Her back is not doing too bad today but was hurting after church yesterday.  PERTINENT HISTORY: Anemia, tremor, B sensorineural hearing loss, TMJ pain, HTN, fibromyalgia, HLD, TKR + revision, anxiety, trochanteric bursitis, glaucoma, hypothyroidism, OA PAIN:  Are you having pain? Yes: NPRS scale:  5 today overall /10 Pain location: right  hip and leg  Pain description: ache  Aggravating factors: "nothing just starts hurting", starts hurting in standing  Relieving factors: CBD cream  PRECAUTIONS: Back due to acute/subacute compression fractures found on MRI 10/19/22  WEIGHT BEARING RESTRICTIONS: No  FALLS:  Has patient fallen in last 6 months? Yes. Number of falls 2- first fall was at 2am, fell trying to pick a candy wrapper off the floor; second fall fell forward trying to get off of commode. FOF +    LIVING ENVIRONMENT: Lives with: lives with their family Lives in: House/apartment Stairs: 1 STE, no steps inside  Has following equipment at home: Single point cane and Environmental consultant - 2 wheeled  OCCUPATION: retired   PLOF: Independent, Independent with basic ADLs, Independent with household mobility with device, Independent with gait, and Independent with transfers  PATIENT GOALS: "to help my back and R LE I don't know if PT will help or"   NEXT MD VISIT: None scheduled with referring   OBJECTIVE:   DIAGNOSTIC FINDINGS:   FINDINGS: No evidence for an acute fracture in the bony pelvis. SI joints and symphysis pubis unremarkable. Joint space in the right hip is relatively well preserved. There is loss of joint space in the left hip with degenerative spurring. No evidence for left femoral neck fracture.   IMPRESSION: 1. No acute bony findings. 2. Degenerative changes in the left hip.  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 Chronic superior endplate fractures at L1 and L3. Extensive atheromatous calcification of the aorta and iliacs.   IMPRESSION: 1. No acute finding. 2. 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  THORACIC SPINE 2 VIEWS   COMPARISON:  None Available.   FINDINGS: No acute fracture or traumatic malalignment. Degenerative disc narrowing with endplate spurring in the mid and lower thoracic spine. Maintained posterior mediastinal fat planes. Mild thoracic levocurvature.   IMPRESSION: Degenerative changes without acute or focal finding.  Three-view radiographs of her right knee were reviewed today she is status  post total right knee arthroplasty components remain in stable position  when compared to previous x-rays.  No evidence of any periprosthetic  fracture    EXAM: MRI LUMBAR SPINE WITHOUT CONTRAST   TECHNIQUE: Multiplanar,  multisequence MR imaging of the lumbar spine was performed. No intravenous contrast was administered.   COMPARISON:  Radiographs 10/01/2022 and 07/31/2022.  MRI 12/04/2019.   FINDINGS: Segmentation: Conventional anatomy assumed, with the last open disc space designated L5-S1.Concordant with prior imaging.   Alignment: Grade 1 anterolisthesis at L3-4 and L4-5, similar to previous MRI.   Vertebrae: There are mild superior endplate compression fractures at the L3 and L4 levels, resulting in approximately 10% loss of vertebral body height. There is associated bone marrow edema, but no osseous retropulsion or definite involvement of the posterior elements. These fractures are not clearly seen on recent or prior radiographs. No other acute osseous findings. The visualized sacroiliac joints appear unremarkable.   Conus medullaris: Extends to the L1-2 level and appears normal.   Paraspinal and other soft tissues: No acute paraspinal findings. The urinary bladder is noted to be distended and moderately trabeculated. 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   Disc levels:   Sagittal images demonstrate no significant disc space findings within the visualized lower thoracic spine. There is mild disc bulging at T11-12 and T12-L1.   L1-2: Preserved disc height. Mild facet hypertrophy. No spinal stenosis or nerve root encroachment.   L2-3:  Preserved disc height with mild disc bulging and mild-to-moderate facet hypertrophy. No spinal stenosis or nerve root encroachment.   L3-4: Moderate to severe facet hypertrophy accounts for the grade 1 anterolisthesis and contributes to annular disc bulging and uncovering. There is resulting moderate multifactorial spinal stenosis with mild lateral recess and minimal foraminal narrowing bilaterally. The spinal stenosis has progressed from the previous MRI   L4-5: Moderate to severe facet hypertrophy accounts for the grade 1 anterolisthesis  and contributes to annular disc bulging and uncovering. There is only resulting borderline spinal stenosis at this level with minimal left lateral recess and left foraminal narrowing. No nerve root encroachment. No significant changes are seen at this level.   L5-S1: Chronic disc bulging with endplate osteophytes asymmetric to the left. Mild facet hypertrophy. No spinal stenosis or nerve root encroachment.   IMPRESSION: 1. Acute/subacute mild superior endplate compression fractures at L3 and L4 with associated bone marrow edema, but no osseous retropulsion. 2. 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 3. Borderline spinal stenosis at L4-5 secondary to facet hypertrophy and a grade 1 anterolisthesis. 4. Distended urinary bladder with trabeculation, suggesting chronic bladder outlet obstruction.      PATIENT SURVEYS:  Eval FOTO 49, predicted 53 in 13 visits  11/27/22 FOTO improved to 59% and met goal   COGNITION: Overall cognitive status: Within functional limits for tasks assessed     SENSATION: "My feet feel numb all the time but not my legs, like up under the bottoms"     POSTURE: rounded shoulders, forward head, decreased lumbar lordosis, increased thoracic kyphosis, and flexed trunk   MUSCLE LENGTH  Piriformis severe limitation B; 11/03/22  R moderate limitation/L severe limitation  HS R moderate limitation, severe limitation L; 11/03/22 R mild limitation/L severe limitation    LUMBAR ROM  Lumbar flexion severe limitation  Lumbar extension mild limitation  Lumbar lateral flexion moderate limitation  B Lumbar rotation moderate limitation B       LOWER EXTREMITY MMT:  MMT Right eval Left eval Rt/Lt 11/27/22  Hip flexion 3 3- 4/4  Hip extension     Hip abduction 3- 3- 4/4  Hip adduction     Hip internal rotation     Hip external rotation     Knee flexion 4+ 4+   Knee extension 4+ 4+ 5/5  Ankle  dorsiflexion 5 5   Ankle plantarflexion     Ankle inversion     Ankle eversion      (Blank rows = not tested)    FUNCTIONAL TESTS:  Eval 5 times sit to stand: 30 seconds no UEs from standard chair Timed up and go (TUG): 22.5 seconds SPC                     Gait speed 1.52ft/second with [facility]   11/27/22  5 times sit to stand 18/8 seconds no UE'ss  Tug time 11.8 sec and met goal for this  GAIT: Distance walked: 64ft Assistive device utilized: Single point cane Level of assistance: Modified independence Comments: flexed at hips, limited step lengths/stance times, slightly unsteady even with SPC    TODAY'S TREATMENT:  DATE:  11/27/22  TherEx  Nustep L5 x8 minutes BLEs only  Updated measurments and goals for progress note and recert Seated lumbar flexion stretch 10 sec X 10 Seated pball isometric shoulder extension core activation 5 sec X 10 Seated pball isometric hip flexion core activation 5 sec X 10 Supine Single knee to chest stretch 10 sec X 5 bilat Supine low trunk rotations 10 sec X 5 bilat Supine piriformis stretch 10 sec X 5 bilat Bridges X 15 Standing hip ABD red TB x15 B Standing hip extensions x15 B red TB  Leg press 75# DL [id number]  21/30/86  TherEx  Nustep L5 x8 minutes BLEs only  Seated lumbar flexion stretch 10 sec X 10 Seated pball isometric shoulder extension core activation 5 sec X 10 Seated pball isometric hip flexion core activation 5 sec X 10 Supine Single knee to chest stretch 10 sec X 5 bilat Supine low trunk rotations 10 sec X 5 bilat Supine piriformis stretch 10 sec X 5 bilat Bridges X 15 Standing hip ABD red TB x10 B Standing hip extensions x10 B red TB  Leg press 75# DL [id number] Standing rows and extensions green 2X12 each Standing lumbar extensions X 10  11/20/22  TherEx  Nustep L5 x6 minutes BLEs only  Seated  lumbar flexion stretch 10 sec X 10 Supine Single knee to chest stretch 10 sec X 5 bilat Supine low trunk rotations 10 sec X 5 bilat Supine piriformis stretch 10 sec X 5 bilat Bridges X 15 Supine SLR X10 bilat Standing hip ABD red TB x10 B Standing hip extensions x10 B red TB  Leg press 75# DL [id number] Standing lumbar extensions X 10    PATIENT EDUCATION:  Education details: exam findings, POC,  benefits of water therapy  Person educated: Patient Education method: Explanation, Demonstration, and Handouts Education comprehension: verbalized understanding, returned demonstration, and needs further education  HOME EXERCISE PROGRAM:  Access Code: [email or web address] URL: [email or web address] Date: 10/27/2022 Prepared by: Nedra Hai  Exercises - Supine Bridge  - 1 x daily - 7 x weekly - 1 sets - 10 reps - 2 hold - Supine Posterior Pelvic Tilt  - 1 x daily - 7 x weekly - 1 sets - 10 reps - 3 hold - Supine Figure 4 Piriformis Stretch  - 1 x daily - 7 x weekly - 1 sets - 3 reps - 30 hold - Hooklying Hamstring Stretch with Strap  - 1 x daily - 7 x weekly - 1 sets - 3 reps - 30 hold  ASSESSMENT:  CLINICAL IMPRESSION: Recert today as PT plan of care date had expired. She has made overall progress with PT and has improved her strength and functional measurments, see above for details. She has met some of her PT goals but not all of them. PT recommends we continue to work to improve her strength and mobility to improve function and manage pain.   OBJECTIVE IMPAIRMENTS: Abnormal gait, decreased activity tolerance, decreased balance, decreased knowledge of use of DME, decreased mobility, difficulty walking, decreased ROM, decreased strength, hypomobility, increased fascial restrictions, impaired flexibility, impaired sensation, postural dysfunction, obesity, and pain.   ACTIVITY LIMITATIONS: standing, stairs, transfers, bed mobility, and locomotion level  PARTICIPATION LIMITATIONS:  shopping, community activity, and yard work  PERSONAL FACTORS: Age, Behavior pattern, Fitness, Past/current experiences, Social background, and Time since onset of injury/illness/exacerbation are also affecting patient's functional outcome.   REHAB POTENTIAL: Fair chronicity of impairments, fibromyalgia, increased pain with activity at eval  CLINICAL DECISION MAKING: Stable/uncomplicated  EVALUATION COMPLEXITY: Low   GOALS: Goals reviewed with patient? No  SHORT TERM GOALS: Target date: 11/03/22   Will be compliant with appropriate progressive HEP  Baseline: Goal status: MET 11/27/22  2.  Pain to be no more than 7/10 at worst  Baseline:  Goal status: Met 11/27/22 3.  Hamstring and piriformis flexibility to have improved by 50% to assist in improving mobility and reducing pain  Baseline:  Goal status: MET 11/27/22  4.  Lumbar AROM to have improved by 50% all directions in order to assist in improving mobility/reducing pain  Baseline:  Goal status: DEFERRED 11/03/22 due to new findings of acute/subacute  lumbar compression fractures    LONG TERM GOALS: Target date: 11/24/22    MMT to have improved by 1 grade in all weak groups  Baseline:  Goal status: onging 11/27/22  2.  Will complete 5x STS test in 15 seconds or less no UEs  Baseline:  Goal status: onging 11/27/22  3.  Will complete TUG test in 13 seconds or less with LRAD to show improved functional balance  Baseline:  Goal status: MET 11/27/22  4.  Pain to be no more than 5/10 at worst  Baseline:  Goal status: onging 11/27/22  5.  FOTO score to improved to at least predicted value  Baseline:  Goal status: MET 11/27/22   PLAN:  PT FREQUENCY: 2x/week  PT DURATION: 6 weeks  PLANNED INTERVENTIONS: Therapeutic exercises, Therapeutic activity, Neuromuscular re-education, Gait training, Self Care, Aquatic Therapy, Manual therapy, and Re-evaluation  PLAN FOR NEXT SESSION: improve and strength and function within her  pain tolerance Ivery Quale, PT, DPT 11/27/22 10:40 AM    Referring diagnosis?  M25.561,G89.29 (ICD-10-CM) - Chronic pain of right knee   Treatment diagnosis? (if different than referring diagnosis)   Muscle weakness (generalized) M62.81  Abnormal posture R29.3  Difficulty in walking, not elsewhere classified R26.2  Pain in right leg M79.604  Unsteadiness on feet R26.81  Other low back pain M54.59  What was this (referring dx) caused by? []  Surgery [x]  Fall [x]  Ongoing issue []  Arthritis []  Other: ____________  Laterality: [x]  Rt []  Lt []  Both  Check all possible CPT codes:  *CHOOSE 10 OR LESS*    []  97110 (Therapeutic Exercise)  []  92507 (SLP Treatment)  []  36644 (Neuro Re-ed)   []  92526 (Swallowing Treatment)   []  97116 (Gait Training)   []  [id number] (Cognitive Training, 1st 15 minutes) []  97140 (Manual Therapy)   []  97130 (Cognitive Training, each add'l 15 minutes)  []  97164 (Re-evaluation)                              []  Other, List CPT Code ____________  []  97530 (Therapeutic Activities)     []  97535 (Self Care)   [x]  All codes above (97110 - 97535)  []  97012 (Mechanical Traction)  []  97014 (E-stim Unattended)  []  97032 (E-stim manual)  []  97033 (Ionto)  []  97035 (Ultrasound) []  97750 (Physical Performance Training) [x]  [id number] (Aquatic Therapy) []  97016 (Vasopneumatic Device) []  [id number] (Paraffin) []  97034 (Contrast Bath) []  97597 (Wound Care 1st 20 sq cm) []  97598 (Wound Care each add'l 20 sq cm) []  97760 (Orthotic Fabrication, Fitting, Training Initial) []  [id number] (Prosthetic Management and Training Initial) []  [id number] (Orthotic or Prosthetic Training/ Modification Subsequent)

## 2022-11-27 NOTE — Progress Notes (Signed)
Barbara Patton - 82 y.o. female MRN [id number]  Date of birth: 1940/07/12  Office Visit Note: Visit Date: 11/15/2022 PCP: Renford Dills, MD Referred by: Renford Dills, MD  Subjective: Chief Complaint  Patient presents with   Lower Back - Pain   HPI:  Barbara Patton is a 82 y.o. female who comes in today at the request of Ellin Goodie, FNP for planned Bilateral L3-4 Lumbar Transforaminal epidural steroid injection with fluoroscopic guidance.  The patient has failed conservative care including home exercise, medications, time and activity modification.  This injection will be diagnostic and hopefully therapeutic.  Please see requesting physician notes for further details and justification.   ROS Otherwise per HPI.  Assessment & Plan: Visit Diagnoses:    ICD-10-CM   1. Lumbar radiculopathy  M54.16 XR C-ARM NO REPORT    Epidural Steroid injection    methylPREDNISolone acetate (DEPO-MEDROL) injection 80 mg      Plan: No additional findings.   Meds & Orders:  Meds ordered this encounter  Medications   methylPREDNISolone acetate (DEPO-MEDROL) injection 80 mg    Orders Placed This Encounter  Procedures   XR C-ARM NO REPORT   Epidural Steroid injection    Follow-up: Return for visit to requesting provider as needed.   Procedures: No procedures performed  Lumbosacral Transforaminal Epidural Steroid Injection - Sub-Pedicular Approach with Fluoroscopic Guidance  Patient: Barbara Patton      Date of Birth: Apr 22, 1941 MRN: [id number] PCP: Renford Dills, MD      Visit Date: 11/15/2022   Universal Protocol:    Date/Time: 11/15/2022  Consent Given By: the patient  Position: PRONE  Additional Comments: Vital signs were monitored before and after the procedure. Patient was prepped and draped in the usual sterile fashion. The correct patient, procedure, and site was verified.   Injection Procedure Details:   Procedure diagnoses: Lumbar radiculopathy [M54.16]     Meds Administered:  Meds ordered this encounter  Medications   methylPREDNISolone acetate (DEPO-MEDROL) injection 80 mg    Laterality: Bilateral  Location/Site: L3  Needle:5.0 in., 22 ga.  Short bevel or Quincke spinal needle  Needle Placement: Transforaminal  Findings:    -Comments: Excellent flow of contrast along the nerve, nerve root and into the epidural space.  Procedure Details: After squaring off the end-plates to get a true AP view, the C-arm was positioned so that an oblique view of the foramen as noted above was visualized. The target area is just inferior to the "nose of the scotty dog" or sub pedicular. The soft tissues overlying this structure were infiltrated with 2-3 ml. of 1% Lidocaine without Epinephrine.  The spinal needle was inserted toward the target using a "trajectory" view along the fluoroscope beam.  Under AP and lateral visualization, the needle was advanced so it did not puncture dura and was located close the 6 O'Clock position of the pedical in AP tracterory. Biplanar projections were used to confirm position. Aspiration was confirmed to be negative for CSF and/or blood. A 1-2 ml. volume of Isovue-250 was injected and flow of contrast was noted at each level. Radiographs were obtained for documentation purposes.   After attaining the desired flow of contrast documented above, a 0.5 to 1.0 ml test dose of 0.25% Marcaine was injected into each respective transforaminal space.  The patient was observed for 90 seconds post injection.  After no sensory deficits were reported, and normal lower extremity motor function was noted,   the above injectate was administered so  that equal amounts of the injectate were placed at each foramen (level) into the transforaminal epidural space.   Additional Comments:  No complications occurred Dressing: 2 x 2 sterile gauze and Band-Aid    Post-procedure details: Patient was observed during the procedure. Post-procedure  instructions were reviewed.  Patient left the clinic in stable condition.    Clinical History: EXAM: MRI LUMBAR SPINE WITHOUT CONTRAST   TECHNIQUE: Multiplanar, multisequence MR imaging of the lumbar spine was performed. No intravenous contrast was administered.   COMPARISON:  Radiographs 10/01/2022 and 07/31/2022.  MRI 12/04/2019.   FINDINGS: Segmentation: Conventional anatomy assumed, with the last open disc space designated L5-S1.Concordant with prior imaging.   Alignment: Grade 1 anterolisthesis at L3-4 and L4-5, similar to previous MRI.   Vertebrae: There are mild superior endplate compression fractures at the L3 and L4 levels, resulting in approximately 10% loss of vertebral body height. There is associated bone marrow edema, but no osseous retropulsion or definite involvement of the posterior elements. These fractures are not clearly seen on recent or prior radiographs. No other acute osseous findings. The visualized sacroiliac joints appear unremarkable.   Conus medullaris: Extends to the L1-2 level and appears normal.   Paraspinal and other soft tissues: No acute paraspinal findings. The urinary bladder is noted to be distended and moderately trabeculated. 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   Disc levels:   Sagittal images demonstrate no significant disc space findings within the visualized lower thoracic spine. There is mild disc bulging at T11-12 and T12-L1.   L1-2: Preserved disc height. Mild facet hypertrophy. No spinal stenosis or nerve root encroachment.   L2-3: Preserved disc height with mild disc bulging and mild-to-moderate facet hypertrophy. No spinal stenosis or nerve root encroachment.   L3-4: Moderate to severe facet hypertrophy accounts for the grade 1 anterolisthesis and contributes to annular disc bulging and uncovering. There is resulting moderate multifactorial spinal stenosis with mild lateral recess and minimal foraminal  narrowing bilaterally. The spinal stenosis has progressed from the previous MRI   L4-5: Moderate to severe facet hypertrophy accounts for the grade 1 anterolisthesis and contributes to annular disc bulging and uncovering. There is only resulting borderline spinal stenosis at this level with minimal left lateral recess and left foraminal narrowing. No nerve root encroachment. No significant changes are seen at this level.   L5-S1: Chronic disc bulging with endplate osteophytes asymmetric to the left. Mild facet hypertrophy. No spinal stenosis or nerve root encroachment.   IMPRESSION: 1. Acute/subacute mild superior endplate compression fractures at L3 and L4 with associated bone marrow edema, but no osseous retropulsion. 2. 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 3. Borderline spinal stenosis at L4-5 secondary to facet hypertrophy and a grade 1 anterolisthesis. 4. Distended urinary bladder with trabeculation, suggesting chronic bladder outlet obstruction.     Electronically Signed   By: Carey Bullocks M.D.   On: 10/26/2022 10:57     Objective:  VS:  HT:    WT:   BMI:     BP:(!) 149/80  HR:78bpm  TEMP: ( )  RESP:  Physical Exam Vitals and nursing note reviewed.  Constitutional:      General: She is not in acute distress.    Appearance: Normal appearance. She is not ill-appearing.  HENT:     Head: Normocephalic and atraumatic.     Right Ear: External ear normal.     Left Ear: External ear normal.  Eyes:  Extraocular Movements: Extraocular movements intact.  Cardiovascular:     Rate and Rhythm: Normal rate.     Pulses: Normal pulses.  Pulmonary:     Effort: Pulmonary effort is normal. No respiratory distress.  Abdominal:     General: There is no distension.     Palpations: Abdomen is soft.  Musculoskeletal:        General: Tenderness present.     Cervical back: Neck supple.     Right lower leg: No  edema.     Left lower leg: No edema.     Comments: Patient has good distal strength with no pain over the greater trochanters.  No clonus or focal weakness.  Skin:    Findings: No erythema, lesion or rash.  Neurological:     General: No focal deficit present.     Mental Status: She is alert and oriented to person, place, and time.     Sensory: No sensory deficit.     Motor: No weakness or abnormal muscle tone.     Coordination: Coordination normal.  Psychiatric:        Mood and Affect: Mood normal.        Behavior: Behavior normal.      Imaging: No results found.

## 2022-11-27 NOTE — Procedures (Signed)
Lumbosacral Transforaminal Epidural Steroid Injection - Sub-Pedicular Approach with Fluoroscopic Guidance  Patient: Barbara Patton      Date of Birth: 25-Jun-1940 MRN: [id number] PCP: Renford Dills, MD      Visit Date: 11/15/2022   Universal Protocol:    Date/Time: 11/15/2022  Consent Given By: the patient  Position: PRONE  Additional Comments: Vital signs were monitored before and after the procedure. Patient was prepped and draped in the usual sterile fashion. The correct patient, procedure, and site was verified.   Injection Procedure Details:   Procedure diagnoses: Lumbar radiculopathy [M54.16]    Meds Administered:  Meds ordered this encounter  Medications   methylPREDNISolone acetate (DEPO-MEDROL) injection 80 mg    Laterality: Bilateral  Location/Site: L3  Needle:5.0 in., 22 ga.  Short bevel or Quincke spinal needle  Needle Placement: Transforaminal  Findings:    -Comments: Excellent flow of contrast along the nerve, nerve root and into the epidural space.  Procedure Details: After squaring off the end-plates to get a true AP view, the C-arm was positioned so that an oblique view of the foramen as noted above was visualized. The target area is just inferior to the "nose of the scotty dog" or sub pedicular. The soft tissues overlying this structure were infiltrated with 2-3 ml. of 1% Lidocaine without Epinephrine.  The spinal needle was inserted toward the target using a "trajectory" view along the fluoroscope beam.  Under AP and lateral visualization, the needle was advanced so it did not puncture dura and was located close the 6 O'Clock position of the pedical in AP tracterory. Biplanar projections were used to confirm position. Aspiration was confirmed to be negative for CSF and/or blood. A 1-2 ml. volume of Isovue-250 was injected and flow of contrast was noted at each level. Radiographs were obtained for documentation purposes.   After attaining the desired  flow of contrast documented above, a 0.5 to 1.0 ml test dose of 0.25% Marcaine was injected into each respective transforaminal space.  The patient was observed for 90 seconds post injection.  After no sensory deficits were reported, and normal lower extremity motor function was noted,   the above injectate was administered so that equal amounts of the injectate were placed at each foramen (level) into the transforaminal epidural space.   Additional Comments:  No complications occurred Dressing: 2 x 2 sterile gauze and Band-Aid    Post-procedure details: Patient was observed during the procedure. Post-procedure instructions were reviewed.  Patient left the clinic in stable condition.

## 2022-11-29 ENCOUNTER — Ambulatory Visit (INDEPENDENT_AMBULATORY_CARE_PROVIDER_SITE_OTHER): Payer: No Typology Code available for payment source | Admitting: Physical Therapy

## 2022-11-29 ENCOUNTER — Encounter: Payer: Self-pay | Admitting: Physical Therapy

## 2022-11-29 DIAGNOSIS — R2681 Unsteadiness on feet: Secondary | ICD-10-CM

## 2022-11-29 DIAGNOSIS — R262 Difficulty in walking, not elsewhere classified: Secondary | ICD-10-CM | POA: Diagnosis not present

## 2022-11-29 DIAGNOSIS — M5459 Other low back pain: Secondary | ICD-10-CM | POA: Diagnosis not present

## 2022-11-29 DIAGNOSIS — M6281 Muscle weakness (generalized): Secondary | ICD-10-CM

## 2022-11-29 DIAGNOSIS — R293 Abnormal posture: Secondary | ICD-10-CM | POA: Diagnosis not present

## 2022-11-29 NOTE — Therapy (Signed)
OUTPATIENT PHYSICAL THERAPY LOWER EXTREMITY TREATMENT    Patient Name: Barbara Patton MRN: [id number] DOB:Apr 05, 1941, 82 y.o., female Today's Date: 11/29/2022  END OF SESSION:  PT End of Session - 11/29/22 1021     Visit Number 9    Number of Visits 15    Date for PT Re-Evaluation 12/25/22    Authorization Type [employer] MCR/GEHA    Authorization Time Period 10/13/22 to 11/24/22 (did new reauth on 6/24)    Progress Note Due on Visit 18    PT Start Time 1014    PT Stop Time 1052    PT Time Calculation (min) 38 min    Activity Tolerance Patient tolerated treatment well    Behavior During Therapy WFL for tasks assessed/performed                   Past Medical History:  Diagnosis Date   Glaucoma    High cholesterol    Hypertension    Lupus (HCC)    Thyroid disease    Past Surgical History:  Procedure Laterality Date   ABDOMINAL HYSTERECTOMY     CATARACT EXTRACTION, BILATERAL     TOTAL KNEE ARTHROPLASTY Bilateral    TUBAL LIGATION     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   Referred otalgia of left ear 08/23/2015   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   Allergic rhinitis 07/26/2015   Anxiety 07/26/2015   Benign essential hypertension 07/26/2015   Dysfunction of left eustachian tube 07/26/2015   Esophageal reflux 07/26/2015   Fibromyalgia 07/26/2015   Pure hypercholesterolemia 07/26/2015   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 05/03/2014   Pes anserinus bursitis of left knee 12/04/2013   Trochanteric  bursitis of left hip 08/07/2013   Leg swelling 12/06/2011   Glaucoma 11/02/2011   HLD (hyperlipidemia) 11/02/2011   HTN (hypertension) 11/02/2011   Hypothyroid 11/02/2011   OA (osteoarthritis) 11/02/2011   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 Gait training Balance Fall Prevention THERAPY DIAG:  Other low back pain  Muscle weakness (generalized)  Abnormal posture  Difficulty in walking, not elsewhere classified  Unsteadiness on feet  Rationale for Evaluation and Treatment: Rehabilitation  ONSET DATE: chronic "its been awhile"   SUBJECTIVE:   SUBJECTIVE STATEMENT: She relays more overall back pain upon arrival.   PERTINENT HISTORY: Anemia, tremor, B sensorineural hearing loss, TMJ pain, HTN, fibromyalgia, HLD, TKR + revision, anxiety, trochanteric bursitis, glaucoma, hypothyroidism, OA PAIN:  Are you having pain? Yes: NPRS scale:  6 today overall /10 Pain location: right hip and leg  Pain description: ache  Aggravating factors: "nothing just starts hurting", starts hurting in standing  Relieving factors: CBD cream  PRECAUTIONS: Back due to acute/subacute compression fractures found on MRI 10/19/22  WEIGHT BEARING RESTRICTIONS: No  FALLS:  Has patient fallen  in last 6 months? Yes. Number of falls 2- first fall was at 2am, fell trying to pick a candy wrapper off the floor; second fall fell forward trying to get off of commode. FOF +   LIVING ENVIRONMENT: Lives with: lives with their family Lives in: House/apartment Stairs: 1 STE, no steps inside  Has following equipment at home: Single point cane and Environmental consultant - 2 wheeled  OCCUPATION: retired   PLOF: Independent, Independent with basic ADLs, Independent with household  mobility with device, Independent with gait, and Independent with transfers  PATIENT GOALS: "to help my back and R LE I don't know if PT will help or"   NEXT MD VISIT: None scheduled with referring   OBJECTIVE:   DIAGNOSTIC FINDINGS:   FINDINGS: No evidence for an acute fracture in the bony pelvis. SI joints and symphysis pubis unremarkable. Joint space in the right hip is relatively well preserved. There is loss of joint space in the left hip with degenerative spurring. No evidence for left femoral neck fracture.   IMPRESSION: 1. No acute bony findings. 2. Degenerative changes in the left hip.  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 Chronic superior endplate fractures at L1 and L3. Extensive atheromatous calcification of the aorta and iliacs.   IMPRESSION: 1. No acute finding. 2. 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  THORACIC SPINE 2 VIEWS   COMPARISON:  None Available.   FINDINGS: No acute fracture or traumatic malalignment. Degenerative disc narrowing with endplate spurring in the mid and lower thoracic spine. Maintained posterior mediastinal fat planes. Mild thoracic levocurvature.   IMPRESSION: Degenerative changes without acute or focal finding.  Three-view radiographs of her right knee were reviewed today she is status  post total right knee arthroplasty components remain in stable position  when compared to previous x-rays.  No evidence of any periprosthetic  fracture    EXAM: MRI LUMBAR SPINE WITHOUT CONTRAST   TECHNIQUE: Multiplanar, multisequence MR imaging of the lumbar spine was performed. No intravenous contrast was administered.   COMPARISON:  Radiographs 10/01/2022 and 07/31/2022.  MRI 12/04/2019.   FINDINGS: Segmentation: Conventional anatomy assumed, with the last open disc space designated L5-S1.Concordant with prior  imaging.   Alignment: Grade 1 anterolisthesis at L3-4 and L4-5, similar to previous MRI.   Vertebrae: There are mild superior endplate compression fractures at the L3 and L4 levels, resulting in approximately 10% loss of vertebral body height. There is associated bone marrow edema, but no osseous retropulsion or definite involvement of the posterior elements. These fractures are not clearly seen on recent or prior radiographs. No other acute osseous findings. The visualized sacroiliac joints appear unremarkable.   Conus medullaris: Extends to the L1-2 level and appears normal.   Paraspinal and other soft tissues: No acute paraspinal findings. The urinary bladder is noted to be distended and moderately trabeculated. 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   Disc levels:   Sagittal images demonstrate no significant disc space findings within the visualized lower thoracic spine. There is mild disc bulging at T11-12 and T12-L1.   L1-2: Preserved disc height. Mild facet hypertrophy. No spinal stenosis or nerve root encroachment.   L2-3: Preserved disc height with mild disc bulging and mild-to-moderate facet hypertrophy. No spinal stenosis or nerve root encroachment.   L3-4: Moderate to severe facet hypertrophy accounts for the grade 1 anterolisthesis and contributes to annular disc bulging and uncovering. There is resulting moderate multifactorial spinal  stenosis with mild lateral recess and minimal foraminal narrowing bilaterally. The spinal stenosis has progressed from the previous MRI   L4-5: Moderate to severe facet hypertrophy accounts for the grade 1 anterolisthesis and contributes to annular disc bulging and uncovering. There is only resulting borderline spinal stenosis at this level with minimal left lateral recess and left foraminal narrowing. No nerve root encroachment. No significant changes are seen at this level.   L5-S1: Chronic disc bulging with  endplate osteophytes asymmetric to the left. Mild facet hypertrophy. No spinal stenosis or nerve root encroachment.   IMPRESSION: 1. Acute/subacute mild superior endplate compression fractures at L3 and L4 with associated bone marrow edema, but no osseous retropulsion. 2. 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 3. Borderline spinal stenosis at L4-5 secondary to facet hypertrophy and a grade 1 anterolisthesis. 4. Distended urinary bladder with trabeculation, suggesting chronic bladder outlet obstruction.      PATIENT SURVEYS:  Eval FOTO 49, predicted 53 in 13 visits  11/27/22 FOTO improved to 59% and met goal   COGNITION: Overall cognitive status: Within functional limits for tasks assessed     SENSATION: "My feet feel numb all the time but not my legs, like up under the bottoms"     POSTURE: rounded shoulders, forward head, decreased lumbar lordosis, increased thoracic kyphosis, and flexed trunk   MUSCLE LENGTH  Piriformis severe limitation B; 11/03/22  R moderate limitation/L severe limitation  HS R moderate limitation, severe limitation L; 11/03/22 R mild limitation/L severe limitation    LUMBAR ROM  Lumbar flexion severe limitation  Lumbar extension mild limitation  Lumbar lateral flexion moderate limitation  B Lumbar rotation moderate limitation B       LOWER EXTREMITY MMT:  MMT Right eval Left eval Rt/Lt 11/27/22  Hip flexion 3 3- 4/4  Hip extension     Hip abduction 3- 3- 4/4  Hip adduction     Hip internal rotation     Hip external rotation     Knee flexion 4+ 4+   Knee extension 4+ 4+ 5/5  Ankle dorsiflexion 5 5   Ankle plantarflexion     Ankle inversion     Ankle eversion      (Blank rows = not tested)    FUNCTIONAL TESTS:  Eval 5 times sit to stand: 30 seconds no UEs from standard chair Timed up and go (TUG): 22.5 seconds SPC                     Gait speed 1.17ft/second with [facility]    11/27/22  5 times sit to stand 18/8 seconds no UE'ss  Tug time 11.8 sec and met goal for this  GAIT: Distance walked: 58ft Assistive device utilized: Single point cane Level of assistance: Modified independence Comments: flexed at hips, limited step lengths/stance times, slightly unsteady even with SPC    TODAY'S TREATMENT:  DATE:  11/27/22  TherEx  Nustep L5 x10 minutes UE/LE, seat 10 Seated lumbar flexion stretch 10 sec X 10 Seated pball isometric shoulder extension core activation 5 sec X 10 Seated pball isometric hip flexion core activation 5 sec X 10 Supine Single knee to chest stretch 10 sec X 5 bilat Supine low trunk rotations 10 sec X 5 bilat Supine piriformis stretch 10 sec X 5 bilat Bridges X 15 Standing hip ABD red TB x15 B Standing hip extensions x15 B red TB  Standing rows and extensions green 2X12 each Leg press 75# DL 1O10  96/04/54  TherEx  Nustep L5 x8 minutes BLEs only  Seated lumbar flexion stretch 10 sec X 10 Seated pball isometric shoulder extension core activation 5 sec X 10 Seated pball isometric hip flexion core activation 5 sec X 10 Supine Single knee to chest stretch 10 sec X 5 bilat Supine low trunk rotations 10 sec X 5 bilat Supine piriformis stretch 10 sec X 5 bilat Bridges X 15 Standing hip ABD red TB x10 B Standing hip extensions x10 B red TB  Leg press 75# DL [id number] Standing rows and extensions green 2X12 each Standing lumbar extensions X 10  11/20/22  TherEx  Nustep L5 x6 minutes BLEs only  Seated lumbar flexion stretch 10 sec X 10 Supine Single knee to chest stretch 10 sec X 5 bilat Supine low trunk rotations 10 sec X 5 bilat Supine piriformis stretch 10 sec X 5 bilat Bridges X 15 Supine SLR X10 bilat Standing hip ABD red TB x10 B Standing hip extensions x10 B red TB  Leg press 75# DL [id number] Standing  lumbar extensions X 10    PATIENT EDUCATION:  Education details: exam findings, POC,  benefits of water therapy  Person educated: Patient Education method: Explanation, Demonstration, and Handouts Education comprehension: verbalized understanding, returned demonstration, and needs further education  HOME EXERCISE PROGRAM:  Access Code: [email or web address] URL: [email or web address] Date: 10/27/2022 Prepared by: Nedra Hai  Exercises - Supine Bridge  - 1 x daily - 7 x weekly - 1 sets - 10 reps - 2 hold - Supine Posterior Pelvic Tilt  - 1 x daily - 7 x weekly - 1 sets - 10 reps - 3 hold - Supine Figure 4 Piriformis Stretch  - 1 x daily - 7 x weekly - 1 sets - 3 reps - 30 hold - Hooklying Hamstring Stretch with Strap  - 1 x daily - 7 x weekly - 1 sets - 3 reps - 30 hold  ASSESSMENT:  CLINICAL IMPRESSION: Despite arriving with more overall back pain without known cause, she did have good tolerance to exercise program today focusing on general strength and lumbar mobility.    OBJECTIVE IMPAIRMENTS: Abnormal gait, decreased activity tolerance, decreased balance, decreased knowledge of use of DME, decreased mobility, difficulty walking, decreased ROM, decreased strength, hypomobility, increased fascial restrictions, impaired flexibility, impaired sensation, postural dysfunction, obesity, and pain.   ACTIVITY LIMITATIONS: standing, stairs, transfers, bed mobility, and locomotion level  PARTICIPATION LIMITATIONS: shopping, community activity, and yard work  PERSONAL FACTORS: Age, Behavior pattern, Fitness, Past/current experiences, Social background, and Time since onset of injury/illness/exacerbation are also affecting patient's functional outcome.   REHAB POTENTIAL: Fair chronicity of impairments, fibromyalgia, increased pain with activity at eval   CLINICAL DECISION MAKING: Stable/uncomplicated  EVALUATION COMPLEXITY: Low   GOALS: Goals reviewed with patient? No  SHORT  TERM GOALS: Target date: 11/03/22   Will be compliant with appropriate progressive HEP  Baseline: Goal  status: MET 11/27/22  2.  Pain to be no more than 7/10 at worst  Baseline:  Goal status: Met 11/27/22 3.  Hamstring and piriformis flexibility to have improved by 50% to assist in improving mobility and reducing pain  Baseline:  Goal status: MET 11/27/22  4.  Lumbar AROM to have improved by 50% all directions in order to assist in improving mobility/reducing pain  Baseline:  Goal status: DEFERRED 11/03/22 due to new findings of acute/subacute  lumbar compression fractures    LONG TERM GOALS: Target date: 11/24/22    MMT to have improved by 1 grade in all weak groups  Baseline:  Goal status: onging 11/27/22  2.  Will complete 5x STS test in 15 seconds or less no UEs  Baseline:  Goal status: onging 11/27/22  3.  Will complete TUG test in 13 seconds or less with LRAD to show improved functional balance  Baseline:  Goal status: MET 11/27/22  4.  Pain to be no more than 5/10 at worst  Baseline:  Goal status: onging 11/27/22  5.  FOTO score to improved to at least predicted value  Baseline:  Goal status: MET 11/27/22   PLAN:  PT FREQUENCY: 2x/week  PT DURATION: 6 weeks  PLANNED INTERVENTIONS: Therapeutic exercises, Therapeutic activity, Neuromuscular re-education, Gait training, Self Care, Aquatic Therapy, Manual therapy, and Re-evaluation  PLAN FOR NEXT SESSION: improve and strength and function within her pain tolerance, try out water therapy next week. Ivery Quale, PT, DPT 11/29/22 10:48 AM    Referring diagnosis?  M25.561,G89.29 (ICD-10-CM) - Chronic pain of right knee   Treatment diagnosis? (if different than referring diagnosis)   Muscle weakness (generalized) M62.81  Abnormal posture R29.3  Difficulty in walking, not elsewhere classified R26.2  Pain in right leg M79.604  Unsteadiness on feet R26.81  Other low back pain M54.59  What was this (referring  dx) caused by? []  Surgery [x]  Fall [x]  Ongoing issue []  Arthritis []  Other: ____________  Laterality: [x]  Rt []  Lt []  Both  Check all possible CPT codes:  *CHOOSE 10 OR LESS*    []  97110 (Therapeutic Exercise)  []  92507 (SLP Treatment)  []  97112 (Neuro Re-ed)   []  92526 (Swallowing Treatment)   []  09811 (Gait Training)   []  [id number] (Cognitive Training, 1st 15 minutes) []  97140 (Manual Therapy)   []  97130 (Cognitive Training, each add'l 15 minutes)  []  97164 (Re-evaluation)                              []  Other, List CPT Code ____________  []  97530 (Therapeutic Activities)     []  97535 (Self Care)   [x]  All codes above (97110 - 97535)  []  97012 (Mechanical Traction)  []  97014 (E-stim Unattended)  []  97032 (E-stim manual)  []  97033 (Ionto)  []  97035 (Ultrasound) []  97750 (Physical Performance Training) [x]  [id number] (Aquatic Therapy) []  97016 (Vasopneumatic Device) []  [id number] (Paraffin) []  97034 (Contrast Bath) []  97597 (Wound Care 1st 20 sq cm) []  97598 (Wound Care each add'l 20 sq cm) []  97760 (Orthotic Fabrication, Fitting, Training Initial) []  [id number] (Prosthetic Management and Training Initial) []  [id number] (Orthotic or Prosthetic Training/ Modification Subsequent)

## 2022-12-04 ENCOUNTER — Encounter: Payer: Self-pay | Admitting: Physical Therapy

## 2022-12-04 ENCOUNTER — Ambulatory Visit (INDEPENDENT_AMBULATORY_CARE_PROVIDER_SITE_OTHER): Payer: Medicare PPO | Admitting: Physical Therapy

## 2022-12-04 DIAGNOSIS — M5459 Other low back pain: Secondary | ICD-10-CM

## 2022-12-04 DIAGNOSIS — R2681 Unsteadiness on feet: Secondary | ICD-10-CM

## 2022-12-04 DIAGNOSIS — R293 Abnormal posture: Secondary | ICD-10-CM

## 2022-12-04 DIAGNOSIS — M6281 Muscle weakness (generalized): Secondary | ICD-10-CM | POA: Diagnosis not present

## 2022-12-04 DIAGNOSIS — M542 Cervicalgia: Secondary | ICD-10-CM

## 2022-12-04 DIAGNOSIS — R262 Difficulty in walking, not elsewhere classified: Secondary | ICD-10-CM | POA: Diagnosis not present

## 2022-12-04 NOTE — Therapy (Signed)
OUTPATIENT PHYSICAL THERAPY LOWER EXTREMITY TREATMENT    Patient Name: Barbara Patton MRN: [id number] DOB:Feb 07, 1941, 82 y.o., female Today's Date: 12/04/2022  END OF SESSION:  PT End of Session - 12/04/22 1023     Visit Number 10    Number of Visits 15    Date for PT Re-Evaluation 12/25/22    Authorization Type [employer] MCR/GEHA    Authorization Time Period 12 visits until 7/22    Authorization - Visit Number 1    Authorization - Number of Visits 12    Progress Note Due on Visit 18    PT Start Time 1016    PT Stop Time 1055    PT Time Calculation (min) 39 min    Activity Tolerance Patient tolerated treatment well    Behavior During Therapy WFL for tasks assessed/performed                   Past Medical History:  Diagnosis Date   Glaucoma    High cholesterol    Hypertension    Lupus (HCC)    Thyroid disease    Past Surgical History:  Procedure Laterality Date   ABDOMINAL HYSTERECTOMY     CATARACT EXTRACTION, BILATERAL     TOTAL KNEE ARTHROPLASTY Bilateral    TUBAL LIGATION     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   Referred otalgia of left ear 08/23/2015   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   Allergic rhinitis 07/26/2015   Anxiety 07/26/2015   Benign essential hypertension 07/26/2015   Dysfunction of left eustachian tube 07/26/2015   Esophageal reflux 07/26/2015   Fibromyalgia 07/26/2015   Pure hypercholesterolemia 07/26/2015   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 Gait training Balance Fall Prevention THERAPY DIAG:  Other low back pain  Muscle weakness (generalized)  Abnormal posture  Difficulty in walking, not elsewhere classified  Unsteadiness on feet  Cervicalgia  Rationale for Evaluation and Treatment: Rehabilitation  ONSET DATE: chronic "its been awhile"   SUBJECTIVE:   SUBJECTIVE STATEMENT: She relays she is tired from waking up early this morning and not being able to fall asleep. She is interested in trying DN  PERTINENT HISTORY: Anemia, tremor, B sensorineural hearing loss, TMJ pain, HTN, fibromyalgia, HLD, TKR + revision, anxiety, trochanteric bursitis, glaucoma, hypothyroidism, OA PAIN:  Are you having pain? Yes: NPRS scale:  5 today overall /10 Pain location: right hip and leg  Pain description: ache  Aggravating factors: "nothing just starts hurting", starts hurting  in standing  Relieving factors: CBD cream  PRECAUTIONS: Back due to acute/subacute compression fractures found on MRI 10/19/22  WEIGHT BEARING RESTRICTIONS: No  FALLS:  Has patient fallen in last 6 months? Yes. Number of falls 2- first fall was at 2am, fell trying to pick a candy wrapper off the floor; second fall fell forward trying to get off of commode. FOF +   LIVING ENVIRONMENT: Lives with: lives with their family Lives in: House/apartment Stairs: 1 STE, no steps inside  Has following equipment at home:  Single point cane and Environmental consultant - 2 wheeled  OCCUPATION: retired   PLOF: Independent, Independent with basic ADLs, Independent with household mobility with device, Independent with gait, and Independent with transfers  PATIENT GOALS: "to help my back and R LE I don't know if PT will help or"   NEXT MD VISIT: None scheduled with referring   OBJECTIVE:   DIAGNOSTIC FINDINGS:   FINDINGS: No evidence for an acute fracture in the bony pelvis. SI joints and symphysis pubis unremarkable. Joint space in the right hip is relatively well preserved. There is loss of joint space in the left hip with degenerative spurring. No evidence for left femoral neck fracture.   IMPRESSION: 1. No acute bony findings. 2. Degenerative changes in the left hip.  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 Chronic superior endplate fractures at L1 and L3. Extensive atheromatous calcification of the aorta and iliacs.   IMPRESSION: 1. No acute finding. 2. 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  THORACIC SPINE 2 VIEWS   COMPARISON:  None Available.   FINDINGS: No acute fracture or traumatic malalignment. Degenerative disc narrowing with endplate spurring in the mid and lower thoracic spine. Maintained posterior mediastinal fat planes. Mild thoracic levocurvature.   IMPRESSION: Degenerative changes without acute or focal finding.  Three-view radiographs of her right knee were reviewed today she is status  post total right knee arthroplasty components remain in stable position  when compared to previous x-rays.  No evidence of any periprosthetic  fracture    EXAM: MRI LUMBAR SPINE WITHOUT CONTRAST   TECHNIQUE: Multiplanar, multisequence MR imaging of the lumbar spine was performed. No intravenous contrast was administered.   COMPARISON:  Radiographs 10/01/2022 and 07/31/2022.  MRI  12/04/2019.   FINDINGS: Segmentation: Conventional anatomy assumed, with the last open disc space designated L5-S1.Concordant with prior imaging.   Alignment: Grade 1 anterolisthesis at L3-4 and L4-5, similar to previous MRI.   Vertebrae: There are mild superior endplate compression fractures at the L3 and L4 levels, resulting in approximately 10% loss of vertebral body height. There is associated bone marrow edema, but no osseous retropulsion or definite involvement of the posterior elements. These fractures are not clearly seen on recent or prior radiographs. No other acute osseous findings. The visualized sacroiliac joints appear unremarkable.   Conus medullaris: Extends to the L1-2 level and appears normal.   Paraspinal and other soft tissues: No acute paraspinal findings. The urinary bladder is noted to be distended and moderately trabeculated. 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   Disc levels:   Sagittal images demonstrate no significant disc space findings within the visualized lower thoracic spine. There is mild disc bulging at T11-12 and T12-L1.   L1-2: Preserved disc height. Mild facet hypertrophy. No spinal stenosis or nerve root encroachment.   L2-3: Preserved disc height with mild disc bulging and mild-to-moderate facet hypertrophy. No spinal stenosis or nerve  root encroachment.   L3-4: Moderate to severe facet hypertrophy accounts for the grade 1 anterolisthesis and contributes to annular disc bulging and uncovering. There is resulting moderate multifactorial spinal stenosis with mild lateral recess and minimal foraminal narrowing bilaterally. The spinal stenosis has progressed from the previous MRI   L4-5: Moderate to severe facet hypertrophy accounts for the grade 1 anterolisthesis and contributes to annular disc bulging and uncovering. There is only resulting borderline spinal stenosis at this level with minimal left lateral recess and  left foraminal narrowing. No nerve root encroachment. No significant changes are seen at this level.   L5-S1: Chronic disc bulging with endplate osteophytes asymmetric to the left. Mild facet hypertrophy. No spinal stenosis or nerve root encroachment.   IMPRESSION: 1. Acute/subacute mild superior endplate compression fractures at L3 and L4 with associated bone marrow edema, but no osseous retropulsion. 2. 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 3. Borderline spinal stenosis at L4-5 secondary to facet hypertrophy and a grade 1 anterolisthesis. 4. Distended urinary bladder with trabeculation, suggesting chronic bladder outlet obstruction.      PATIENT SURVEYS:  Eval FOTO 49, predicted 53 in 13 visits  11/27/22 FOTO improved to 59% and met goal   COGNITION: Overall cognitive status: Within functional limits for tasks assessed     SENSATION: "My feet feel numb all the time but not my legs, like up under the bottoms"     POSTURE: rounded shoulders, forward head, decreased lumbar lordosis, increased thoracic kyphosis, and flexed trunk   MUSCLE LENGTH  Piriformis severe limitation B; 11/03/22  R moderate limitation/L severe limitation  HS R moderate limitation, severe limitation L; 11/03/22 R mild limitation/L severe limitation    LUMBAR ROM  Lumbar flexion severe limitation  Lumbar extension mild limitation  Lumbar lateral flexion moderate limitation  B Lumbar rotation moderate limitation B       LOWER EXTREMITY MMT:  MMT Right eval Left eval Rt/Lt 11/27/22  Hip flexion 3 3- 4/4  Hip extension     Hip abduction 3- 3- 4/4  Hip adduction     Hip internal rotation     Hip external rotation     Knee flexion 4+ 4+   Knee extension 4+ 4+ 5/5  Ankle dorsiflexion 5 5   Ankle plantarflexion     Ankle inversion     Ankle eversion      (Blank rows = not tested)    FUNCTIONAL TESTS:  Eval 5 times sit to  stand: 30 seconds no UEs from standard chair Timed up and go (TUG): 22.5 seconds SPC                     Gait speed 1.42ft/second with [facility]   11/27/22  5 times sit to stand 18.8 seconds no UE'ss  Tug time 11.8 sec and met goal for this  GAIT: Distance walked: 36ft Assistive device utilized: Single point cane Level of assistance: Modified independence Comments: flexed at hips, limited step lengths/stance times, slightly unsteady even with SPC    TODAY'S TREATMENT:  DATE:  12/04/22 Nustep L5 x10 minutes UE/LE, seat 10 Manual therapy for active compression and skilled palpation and Trigger Point Dry-Needling  Treatment instructions: Expect mild to moderate muscle soreness. Patient Consent Given: Yes Education handout provided: verbally provided Muscles treated: Lumbar paraspinals and multifidi,  Treatment response/outcome: good overall tolerance,twitch response noted  Seated lumbar flexion stretch 10 sec X 10 Seated pball isometric shoulder extension core activation 5 sec X 10 Seated pball isometric hip flexion core activation 5 sec X 10 Supine Single knee to chest stretch 20 sec X 3 bilat Supine low trunk rotations 10 sec X 3 bilat Supine piriformis stretch 20 sec X 3 bilat Bridges X 15 Standing hip ABD red TB x15 B Standing hip extensions x15 B red TB  Standing rows and extensions green X15 each  11/27/22  TherEx  Nustep L5 x10 minutes UE/LE, seat 10 Seated lumbar flexion stretch 10 sec X 10 Seated pball isometric shoulder extension core activation 5 sec X 10 Seated pball isometric hip flexion core activation 5 sec X 10 Supine Single knee to chest stretch 10 sec X 5 bilat Supine low trunk rotations 10 sec X 5 bilat Supine piriformis stretch 10 sec X 5 bilat Bridges X 15 Standing hip ABD red TB x15 B Standing hip extensions x15 B red TB  Standing  rows and extensions green 2X12 each Leg press 75# DL [id number]  13/24/40  TherEx  Nustep L5 x8 minutes BLEs only  Seated lumbar flexion stretch 10 sec X 10 Seated pball isometric shoulder extension core activation 5 sec X 10 Seated pball isometric hip flexion core activation 5 sec X 10 Supine Single knee to chest stretch 10 sec X 5 bilat Supine low trunk rotations 10 sec X 5 bilat Supine piriformis stretch 10 sec X 5 bilat Bridges X 15 Standing hip ABD red TB x10 B Standing hip extensions x10 B red TB  Leg press 75# DL [id number] Standing rows and extensions green 2X12 each Standing lumbar extensions X 10   PATIENT EDUCATION:  Education details: exam findings, POC,  benefits of water therapy  Person educated: Patient Education method: Explanation, Demonstration, and Handouts Education comprehension: verbalized understanding, returned demonstration, and needs further education  HOME EXERCISE PROGRAM:  Access Code: [email or web address] URL: [email or web address] Date: 10/27/2022 Prepared by: Nedra Hai  Exercises - Supine Bridge  - 1 x daily - 7 x weekly - 1 sets - 10 reps - 2 hold - Supine Posterior Pelvic Tilt  - 1 x daily - 7 x weekly - 1 sets - 10 reps - 3 hold - Supine Figure 4 Piriformis Stretch  - 1 x daily - 7 x weekly - 1 sets - 3 reps - 30 hold - Hooklying Hamstring Stretch with Strap  - 1 x daily - 7 x weekly - 1 sets - 3 reps - 30 hold  ASSESSMENT:  CLINICAL IMPRESSION: We continued with her exercise program for lumbar mobility and leg strengthening and also tried Dry needling today to her low back to see if this will help manage her pain any. We will also try out water therapy next visit as well to see if this will help with pain symptoms.    OBJECTIVE IMPAIRMENTS: Abnormal gait, decreased activity tolerance, decreased balance, decreased knowledge of use of DME, decreased mobility, difficulty walking, decreased ROM, decreased strength, hypomobility, increased  fascial restrictions, impaired flexibility, impaired sensation, postural dysfunction, obesity, and pain.   ACTIVITY LIMITATIONS: standing, stairs, transfers, bed mobility, and locomotion level  PARTICIPATION  LIMITATIONS: shopping, community activity, and yard work  PERSONAL FACTORS: Age, Behavior pattern, Fitness, Past/current experiences, Social background, and Time since onset of injury/illness/exacerbation are also affecting patient's functional outcome.   REHAB POTENTIAL: Fair chronicity of impairments, fibromyalgia, increased pain with activity at eval   CLINICAL DECISION MAKING: Stable/uncomplicated  EVALUATION COMPLEXITY: Low   GOALS: Goals reviewed with patient? No  SHORT TERM GOALS: Target date: 11/03/22   Will be compliant with appropriate progressive HEP  Baseline: Goal status: MET 11/27/22  2.  Pain to be no more than 7/10 at worst  Baseline:  Goal status: Met 11/27/22 3.  Hamstring and piriformis flexibility to have improved by 50% to assist in improving mobility and reducing pain  Baseline:  Goal status: MET 11/27/22  4.  Lumbar AROM to have improved by 50% all directions in order to assist in improving mobility/reducing pain  Baseline:  Goal status: DEFERRED 11/03/22 due to new findings of acute/subacute  lumbar compression fractures    LONG TERM GOALS: Target date: 11/24/22    MMT to have improved by 1 grade in all weak groups  Baseline:  Goal status: onging 11/27/22  2.  Will complete 5x STS Patton in 15 seconds or less no UEs  Baseline:  Goal status: onging 11/27/22  3.  Will complete TUG Patton in 13 seconds or less with LRAD to show improved functional balance  Baseline:  Goal status: MET 11/27/22  4.  Pain to be no more than 5/10 at worst  Baseline:  Goal status: onging 11/27/22  5.  FOTO score to improved to at least predicted value  Baseline:  Goal status: MET 11/27/22   PLAN:  PT FREQUENCY: 2x/week  PT DURATION: 6 weeks  PLANNED  INTERVENTIONS: Therapeutic exercises, Therapeutic activity, Neuromuscular re-education, Gait training, Self Care, Aquatic Therapy, Manual therapy, and Re-evaluation  PLAN FOR NEXT SESSION: try out water therapy, how was DN from last time? She needs to set up more visits if she wants.  Ivery Quale, PT, DPT 12/04/22 10:40 AM    Referring diagnosis?  M25.561,G89.29 (ICD-10-CM) - Chronic pain of right knee   Treatment diagnosis? (if different than referring diagnosis)   Muscle weakness (generalized) M62.81  Abnormal posture R29.3  Difficulty in walking, not elsewhere classified R26.2  Pain in right leg M79.604  Unsteadiness on feet R26.81  Other low back pain M54.59  What was this (referring dx) caused by? []  Surgery [x]  Fall [x]  Ongoing issue []  Arthritis []  Other: ____________  Laterality: [x]  Rt []  Lt []  Both  Check all possible CPT codes:  *CHOOSE 10 OR LESS*    []  97110 (Therapeutic Exercise)  []  92507 (SLP Treatment)  []  97112 (Neuro Re-ed)   []  92526 (Swallowing Treatment)   []  97116 (Gait Training)   []  [id number] (Cognitive Training, 1st 15 minutes) []  97140 (Manual Therapy)   []  97130 (Cognitive Training, each add'l 15 minutes)  []  97164 (Re-evaluation)                              []  Other, List CPT Code ____________  []  97530 (Therapeutic Activities)     []  97535 (Self Care)   [x]  All codes above (97110 - 97535)  []  97012 (Mechanical Traction)  []  97014 (E-stim Unattended)  []  97032 (E-stim manual)  []  97033 (Ionto)  []  97035 (Ultrasound) []  97750 (Physical Performance Training) [x]  [id number] (Aquatic Therapy) []  97016 (Vasopneumatic Device) []  [id number] (Paraffin) []  16109 (Contrast  Bath) []  [phone] (Wound Care 1st 20 sq cm) []  97598 (Wound Care each add'l 20 sq cm) []  97760 (Orthotic Fabrication, Fitting, Training Initial) []  [id number] (Prosthetic Management and Training Initial) []  [id number] (Orthotic or Prosthetic Training/ Modification Subsequent)

## 2022-12-06 DIAGNOSIS — Z79899 Other long term (current) drug therapy: Secondary | ICD-10-CM | POA: Diagnosis not present

## 2022-12-06 DIAGNOSIS — H35371 Puckering of macula, right eye: Secondary | ICD-10-CM | POA: Diagnosis not present

## 2022-12-06 DIAGNOSIS — H401132 Primary open-angle glaucoma, bilateral, moderate stage: Secondary | ICD-10-CM | POA: Diagnosis not present

## 2022-12-08 ENCOUNTER — Encounter: Payer: Self-pay | Admitting: Physical Therapy

## 2022-12-08 ENCOUNTER — Ambulatory Visit (INDEPENDENT_AMBULATORY_CARE_PROVIDER_SITE_OTHER): Payer: Medicare PPO | Admitting: Physical Therapy

## 2022-12-08 DIAGNOSIS — M5459 Other low back pain: Secondary | ICD-10-CM

## 2022-12-08 DIAGNOSIS — M6281 Muscle weakness (generalized): Secondary | ICD-10-CM

## 2022-12-08 DIAGNOSIS — R2681 Unsteadiness on feet: Secondary | ICD-10-CM | POA: Diagnosis not present

## 2022-12-08 DIAGNOSIS — R262 Difficulty in walking, not elsewhere classified: Secondary | ICD-10-CM

## 2022-12-08 NOTE — Therapy (Addendum)
OUTPATIENT PHYSICAL THERAPY LOWER EXTREMITY TREATMENT    Patient Name: Barbara Patton MRN: [id number] DOB:[birth year], 82 y.o., female Today's Date: 12/08/2022  END OF SESSION:  PT End of Session - 12/08/22 1024     Visit Number 11    Number of Visits 15    Date for PT Re-Evaluation 12/25/22    Authorization Type [employer] MCR/GEHA    Authorization Time Period 12 visits until 7/22    Authorization - Visit Number 2    Authorization - Number of Visits 12    Progress Note Due on Visit 18    PT Start Time 1014    PT Stop Time 1059    PT Time Calculation (min) 45 min    Activity Tolerance Patient tolerated treatment well    Behavior During Therapy WFL for tasks assessed/performed                   Past Medical History:  Diagnosis Date   Glaucoma    High cholesterol    Hypertension    Lupus (HCC)    Thyroid disease    Past Surgical History:  Procedure Laterality Date   ABDOMINAL HYSTERECTOMY     CATARACT EXTRACTION, BILATERAL     TOTAL KNEE ARTHROPLASTY Bilateral    TUBAL LIGATION     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   Referred otalgia of left ear 08/23/2015   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   Allergic rhinitis 07/26/2015   Anxiety 07/26/2015   Benign essential hypertension 07/26/2015   Dysfunction of left eustachian tube 07/26/2015   Esophageal reflux 07/26/2015   Fibromyalgia 07/26/2015   Pure hypercholesterolemia 07/26/2015   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 05/03/2014   Pes anserinus  bursitis of left knee 12/04/2013   Trochanteric bursitis of left hip 08/07/2013   Leg swelling 12/06/2011   Glaucoma 11/02/2011   HLD (hyperlipidemia) 11/02/2011   HTN (hypertension) 11/02/2011   Hypothyroid 11/02/2011   OA (osteoarthritis) 11/02/2011   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 Gait training Balance Fall Prevention THERAPY DIAG:  Other low back pain  Muscle weakness (generalized)  Difficulty in walking, not elsewhere classified  Unsteadiness on feet  Rationale for Evaluation and Treatment: Rehabilitation  ONSET DATE: chronic "its been awhile"   SUBJECTIVE:   SUBJECTIVE STATEMENT: She reports the DN did not help any, still with same back pain.   PERTINENT HISTORY: Anemia, tremor, B sensorineural hearing loss, TMJ pain, HTN, fibromyalgia, HLD, TKR + revision, anxiety, trochanteric bursitis, glaucoma, hypothyroidism, OA PAIN:  Are you having pain? Yes: NPRS scale:  5 today overall /10 Pain location: right hip and leg  Pain description: ache  Aggravating factors: "nothing just starts hurting", starts hurting in standing  Relieving factors: CBD cream  PRECAUTIONS: Back due to acute/subacute compression fractures  found on MRI 10/19/22  WEIGHT BEARING RESTRICTIONS: No  FALLS:  Has patient fallen in last 6 months? Yes. Number of falls 2- first fall was at 2am, fell trying to pick a candy wrapper off the floor; second fall fell forward trying to get off of commode. FOF +   LIVING ENVIRONMENT: Lives with: lives with their family Lives in: House/apartment Stairs: 1 STE, no steps inside  Has following equipment at home: Single point cane and Environmental consultant - 2 wheeled  OCCUPATION: retired   PLOF: Independent, Independent  with basic ADLs, Independent with household mobility with device, Independent with gait, and Independent with transfers  PATIENT GOALS: "to help my back and R LE I don't know if PT will help or"   NEXT MD VISIT: None scheduled with referring   OBJECTIVE:   DIAGNOSTIC FINDINGS:   FINDINGS: No evidence for an acute fracture in the bony pelvis. SI joints and symphysis pubis unremarkable. Joint space in the right hip is relatively well preserved. There is loss of joint space in the left hip with degenerative spurring. No evidence for left femoral neck fracture.   IMPRESSION: 1. No acute bony findings. 2. Degenerative changes in the left hip.  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 Chronic superior endplate fractures at L1 and L3. Extensive atheromatous calcification of the aorta and iliacs.   IMPRESSION: 1. No acute finding. 2. 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  THORACIC SPINE 2 VIEWS   COMPARISON:  None Available.   FINDINGS: No acute fracture or traumatic malalignment. Degenerative disc narrowing with endplate spurring in the mid and lower thoracic spine. Maintained posterior mediastinal fat planes. Mild thoracic levocurvature.   IMPRESSION: Degenerative changes without acute or focal finding.  Three-view radiographs of her right knee were reviewed today she is status  post total right knee arthroplasty components remain in stable position  when compared to previous x-rays.  No evidence of any periprosthetic  fracture    EXAM: MRI LUMBAR SPINE WITHOUT CONTRAST   TECHNIQUE: Multiplanar, multisequence MR imaging of the lumbar spine was performed. No intravenous contrast was administered.   COMPARISON:  Radiographs 10/01/2022 and 07/31/2022.  MRI 12/04/2019.   FINDINGS: Segmentation: Conventional anatomy assumed, with the last open disc space  designated L5-S1.Concordant with prior imaging.   Alignment: Grade 1 anterolisthesis at L3-4 and L4-5, similar to previous MRI.   Vertebrae: There are mild superior endplate compression fractures at the L3 and L4 levels, resulting in approximately 10% loss of vertebral body height. There is associated bone marrow edema, but no osseous retropulsion or definite involvement of the posterior elements. These fractures are not clearly seen on recent or prior radiographs. No other acute osseous findings. The visualized sacroiliac joints appear unremarkable.   Conus medullaris: Extends to the L1-2 level and appears normal.   Paraspinal and other soft tissues: No acute paraspinal findings. The urinary bladder is noted to be distended and moderately trabeculated. 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   Disc levels:   Sagittal images demonstrate no significant disc space findings within the visualized lower thoracic spine. There is mild disc bulging at T11-12 and T12-L1.   L1-2: Preserved disc height. Mild facet hypertrophy. No spinal stenosis or nerve root encroachment.   L2-3: Preserved disc height with mild disc bulging and mild-to-moderate facet hypertrophy. No spinal stenosis or nerve root encroachment.   L3-4: Moderate to severe facet hypertrophy accounts for the grade 1  anterolisthesis and contributes to annular disc bulging and uncovering. There is resulting moderate multifactorial spinal stenosis with mild lateral recess and minimal foraminal narrowing bilaterally. The spinal stenosis has progressed from the previous MRI   L4-5: Moderate to severe facet hypertrophy accounts for the grade 1 anterolisthesis and contributes to annular disc bulging and uncovering. There is only resulting borderline spinal stenosis at this level with minimal left lateral recess and left foraminal narrowing. No nerve root encroachment. No significant changes are seen at this level.    L5-S1: Chronic disc bulging with endplate osteophytes asymmetric to the left. Mild facet hypertrophy. No spinal stenosis or nerve root encroachment.   IMPRESSION: 1. Acute/subacute mild superior endplate compression fractures at L3 and L4 with associated bone marrow edema, but no osseous retropulsion. 2. 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 3. Borderline spinal stenosis at L4-5 secondary to facet hypertrophy and a grade 1 anterolisthesis. 4. Distended urinary bladder with trabeculation, suggesting chronic bladder outlet obstruction.      PATIENT SURVEYS:  Eval FOTO 49, predicted 53 in 13 visits  11/27/22 FOTO improved to 59% and met goal   COGNITION: Overall cognitive status: Within functional limits for tasks assessed     SENSATION: "My feet feel numb all the time but not my legs, like up under the bottoms"     POSTURE: rounded shoulders, forward head, decreased lumbar lordosis, increased thoracic kyphosis, and flexed trunk   MUSCLE LENGTH  Piriformis severe limitation B; 11/03/22  R moderate limitation/L severe limitation  HS R moderate limitation, severe limitation L; 11/03/22 R mild limitation/L severe limitation    LUMBAR ROM  Lumbar flexion severe limitation  Lumbar extension mild limitation  Lumbar lateral flexion moderate limitation  B Lumbar rotation moderate limitation B       LOWER EXTREMITY MMT:  MMT Right eval Left eval Rt/Lt 11/27/22  Hip flexion 3 3- 4/4  Hip extension     Hip abduction 3- 3- 4/4  Hip adduction     Hip internal rotation     Hip external rotation     Knee flexion 4+ 4+   Knee extension 4+ 4+ 5/5  Ankle dorsiflexion 5 5   Ankle plantarflexion     Ankle inversion     Ankle eversion      (Blank rows = not tested)    FUNCTIONAL TESTS:  Eval 5 times sit to stand: 30 seconds no UEs from standard chair Timed up and go (TUG): 22.5 seconds SPC                      Gait speed 1.75ft/second with [facility]   11/27/22  5 times sit to stand 18.8 seconds no UE'ss  Tug time 11.8 sec and met goal for this  GAIT: Distance walked: 25ft Assistive device utilized: Single point cane Level of assistance: Modified independence Comments: flexed at hips, limited step lengths/stance times, slightly unsteady even with SPC    TODAY'S TREATMENT                                                                          DATE: 12/08/22 Pt seen for aquatic therapy today.  Treatment took place  in water 3.5-4.75 ft in depth at the [facility] pool. Temp of water was 91.  Pt entered/exited the pool via stairs with  rail.   Pt requires the buoyancy and hydrostatic pressure of water for support, and to offload joints by unweighting joint load by at least 50 % in navel deep water and by at least 75-80% in chest to neck deep water.  Viscosity of the water is needed for resistance of strengthening. Water current perturbations provides challenge to standing balance requiring increased core activation. Aquatic exercises Lateral walking, forward walking, retro walking 3 round trips in pool horizontal (30 feet) all holding pool noodle for support Leg swings add/abd X 15 bilat with UE support cues for slow and controlled movement Leg swings flexion/extension X 15 bilat with UE support cues for slow and controlled movements Marching with UE support X 15 reps bilat Hamstring curls with UE support X 15 reps bilat 1/4 squat with  back against pool wall and shoulder extension/flexion holding kickboard  X15 reps Lumbar L stretch holding on at pool rails X 10, holding 5 sec 3 ways steps (fwd, lateral, back) holding pool noodle X 10 each leg Step ups onto first step of pool, no UE support X 20 reps on Rt knee Squats with shoulder horizontal add with water Dumbells 2X10 Standing truck rotations with dumbells X10 bilat Standing reciprocal shoulder flexion/extension alternating X 15  bilat Seated on pool bench, bicycle kicks X 20 and hip abd/add X 20   12/04/22 Nustep L5 x10 minutes UE/LE, seat 10 Manual therapy for active compression and skilled palpation and Trigger Point Dry-Needling  Treatment instructions: Expect mild to moderate muscle soreness. Patient Consent Given: Yes Education handout provided: verbally provided Muscles treated: Lumbar paraspinals and multifidi,  Treatment response/outcome: good overall tolerance,twitch response noted  Seated lumbar flexion stretch 10 sec X 10 Seated pball isometric shoulder extension core activation 5 sec X 10 Seated pball isometric hip flexion core activation 5 sec X 10 Supine Single knee to chest stretch 20 sec X 3 bilat Supine low trunk rotations 10 sec X 3 bilat Supine piriformis stretch 20 sec X 3 bilat Bridges X 15 Standing hip ABD red TB x15 B Standing hip extensions x15 B red TB  Standing rows and extensions green X15 each     PATIENT EDUCATION:  Education details: exam findings, POC,  benefits of water therapy  Person educated: Patient Education method: Explanation, Demonstration, and Handouts Education comprehension: verbalized understanding, returned demonstration, and needs further education  HOME EXERCISE PROGRAM:  Access Code: [email or web address] URL: [email or web address] Date: 10/27/2022 Prepared by: Nedra Hai  Exercises - Supine Bridge  - 1 x daily - 7 x weekly - 1 sets - 10 reps - 2 hold - Supine Posterior Pelvic Tilt  - 1 x daily - 7 x weekly - 1 sets - 10 reps - 3 hold - Supine Figure 4 Piriformis Stretch  - 1 x daily - 7 x weekly - 1 sets - 3 reps - 30 hold - Hooklying Hamstring Stretch with Strap  - 1 x daily - 7 x weekly - 1 sets - 3 reps - 30 hold  WATER HEP Access Code: [email or web address] URL: [email or web address] Date: 12/08/2022 Prepared by: Ivery Quale  Exercises - Forward Walking  - 1 x daily - 2 x weekly - 4 sets - Side Stepping  - 2 x daily - 2 x weekly - 4  sets - 10 reps - Backward Walking  - 1 x daily - 2  x weekly - 4 sets - Standing March at [facility]  - 1 x daily - 2 x weekly - 15 reps - Standing Hip Flexion Extension at [employer]  - 1 x daily - 2 x weekly - 15 reps - Standing Hip Abduction Adduction at Pool Wall  - 1 x daily - 2 x weekly - 20 reps - Standing Knee Flexion  - 2 x daily - 6 x weekly - 3 sets - 10 reps - Squat  - 1 x daily - 2 x weekly - 15 reps - Lunge to [employer] at [employer]  - 1 x daily - 2 x weekly - 1 sets - 20 reps - Forward Monster Walk  - 1 x daily - 2 x weekly - 4 sets  ASSESSMENT:  CLINICAL IMPRESSION: She did not get any relief from DN last time. This was her first water session so I did print out water HEP for her. She has made some objective functional progress but is still limited by the same pain in her back.  We will continue to work to improve this with PT and if not much progress made over next few visits will refer back to MD at that point.    OBJECTIVE IMPAIRMENTS: Abnormal gait, decreased activity tolerance, decreased balance, decreased knowledge of use of DME, decreased mobility, difficulty walking, decreased ROM, decreased strength, hypomobility, increased fascial restrictions, impaired flexibility, impaired sensation, postural dysfunction, obesity, and pain.   ACTIVITY LIMITATIONS: standing, stairs, transfers, bed mobility, and locomotion level  PARTICIPATION LIMITATIONS: shopping, community activity, and yard work  PERSONAL FACTORS: Age, Behavior pattern, Fitness, Past/current experiences, Social background, and Time since onset of injury/illness/exacerbation are also affecting patient's functional outcome.   REHAB POTENTIAL: Fair chronicity of impairments, fibromyalgia, increased pain with activity at eval   CLINICAL DECISION MAKING: Stable/uncomplicated  EVALUATION COMPLEXITY: Low   GOALS: Goals reviewed with patient? No  SHORT TERM GOALS: Target date: 11/03/22   Will be compliant with  appropriate progressive HEP  Baseline: Goal status: MET 11/27/22  2.  Pain to be no more than 7/10 at worst  Baseline:  Goal status: Met 11/27/22 3.  Hamstring and piriformis flexibility to have improved by 50% to assist in improving mobility and reducing pain  Baseline:  Goal status: MET 11/27/22  4.  Lumbar AROM to have improved by 50% all directions in order to assist in improving mobility/reducing pain  Baseline:  Goal status: DEFERRED 11/03/22 due to new findings of acute/subacute  lumbar compression fractures    LONG TERM GOALS: Target date: 11/24/22    MMT to have improved by 1 grade in all weak groups  Baseline:  Goal status: onging 11/27/22  2.  Will complete 5x STS test in 15 seconds or less no UEs  Baseline:  Goal status: onging 11/27/22  3.  Will complete TUG test in 13 seconds or less with LRAD to show improved functional balance  Baseline:  Goal status: MET 11/27/22  4.  Pain to be no more than 5/10 at worst  Baseline:  Goal status: onging 11/27/22  5.  FOTO score to improved to at least predicted value  Baseline:  Goal status: MET 11/27/22   PLAN:  PT FREQUENCY: 2x/week  PT DURATION: 6 weeks  PLANNED INTERVENTIONS: Therapeutic exercises, Therapeutic activity, Neuromuscular re-education, Gait training, Self Care, Aquatic Therapy, Manual therapy, and Re-evaluation  PLAN FOR NEXT SESSION: water therapy if helpful, continue with functional strength/endurance and if no significant improvements will refer back  to MD.   Ivery Quale, PT, DPT 12/08/22 10:46 AM    Referring diagnosis?  M25.561,G89.29 (ICD-10-CM) - Chronic pain of right knee   Treatment diagnosis? (if different than referring diagnosis)   Muscle weakness (generalized) M62.81  Abnormal posture R29.3  Difficulty in walking, not elsewhere classified R26.2  Pain in right leg M79.604  Unsteadiness on feet R26.81  Other low back pain M54.59  What was this (referring dx) caused by? []   Surgery [x]  Fall [x]  Ongoing issue []  Arthritis []  Other: ____________  Laterality: [x]  Rt []  Lt []  Both  Check all possible CPT codes:  *CHOOSE 10 OR LESS*    []  97110 (Therapeutic Exercise)  []  92507 (SLP Treatment)  []  97112 (Neuro Re-ed)   []  16109 (Swallowing Treatment)   []  97116 (Gait Training)   []  [id number] (Cognitive Training, 1st 15 minutes) []  97140 (Manual Therapy)   []  97130 (Cognitive Training, each add'l 15 minutes)  []  97164 (Re-evaluation)                              []  Other, List CPT Code ____________  []  97530 (Therapeutic Activities)     []  97535 (Self Care)   [x]  All codes above (97110 - 97535)  []  97012 (Mechanical Traction)  []  97014 (E-stim Unattended)  []  97032 (E-stim manual)  []  97033 (Ionto)  []  97035 (Ultrasound) []  97750 (Physical Performance Training) [x]  [id number] (Aquatic Therapy) []  97016 (Vasopneumatic Device) []  [id number] (Paraffin) []  97034 (Contrast Bath) []  97597 (Wound Care 1st 20 sq cm) []  97598 (Wound Care each add'l 20 sq cm) []  97760 (Orthotic Fabrication, Fitting, Training Initial) []  [id number] (Prosthetic Management and Training Initial) []  [id number] (Orthotic or Prosthetic Training/ Modification Subsequent)

## 2022-12-12 DIAGNOSIS — Z79899 Other long term (current) drug therapy: Secondary | ICD-10-CM | POA: Diagnosis not present

## 2022-12-13 ENCOUNTER — Encounter: Payer: Medicare PPO | Admitting: Physical Therapy

## 2022-12-13 DIAGNOSIS — I10 Essential (primary) hypertension: Secondary | ICD-10-CM | POA: Diagnosis not present

## 2022-12-13 DIAGNOSIS — Z1331 Encounter for screening for depression: Secondary | ICD-10-CM | POA: Diagnosis not present

## 2022-12-13 DIAGNOSIS — Z9181 History of falling: Secondary | ICD-10-CM | POA: Diagnosis not present

## 2022-12-13 DIAGNOSIS — M329 Systemic lupus erythematosus, unspecified: Secondary | ICD-10-CM | POA: Diagnosis not present

## 2022-12-13 DIAGNOSIS — I5189 Other ill-defined heart diseases: Secondary | ICD-10-CM | POA: Diagnosis not present

## 2022-12-13 DIAGNOSIS — G8929 Other chronic pain: Secondary | ICD-10-CM | POA: Diagnosis not present

## 2022-12-13 DIAGNOSIS — E039 Hypothyroidism, unspecified: Secondary | ICD-10-CM | POA: Diagnosis not present

## 2022-12-13 DIAGNOSIS — R269 Unspecified abnormalities of gait and mobility: Secondary | ICD-10-CM | POA: Diagnosis not present

## 2022-12-13 DIAGNOSIS — E7800 Pure hypercholesterolemia, unspecified: Secondary | ICD-10-CM | POA: Diagnosis not present

## 2022-12-13 DIAGNOSIS — Z0000 Encounter for general adult medical examination without abnormal findings: Secondary | ICD-10-CM | POA: Diagnosis not present

## 2022-12-15 ENCOUNTER — Ambulatory Visit (INDEPENDENT_AMBULATORY_CARE_PROVIDER_SITE_OTHER): Payer: Medicare PPO | Admitting: Physical Therapy

## 2022-12-15 ENCOUNTER — Encounter: Payer: Self-pay | Admitting: Physical Therapy

## 2022-12-15 DIAGNOSIS — R293 Abnormal posture: Secondary | ICD-10-CM | POA: Diagnosis not present

## 2022-12-15 DIAGNOSIS — M5459 Other low back pain: Secondary | ICD-10-CM | POA: Diagnosis not present

## 2022-12-15 DIAGNOSIS — M6281 Muscle weakness (generalized): Secondary | ICD-10-CM

## 2022-12-15 DIAGNOSIS — R262 Difficulty in walking, not elsewhere classified: Secondary | ICD-10-CM | POA: Diagnosis not present

## 2022-12-15 DIAGNOSIS — R2681 Unsteadiness on feet: Secondary | ICD-10-CM

## 2022-12-15 NOTE — Therapy (Signed)
OUTPATIENT PHYSICAL THERAPY LOWER EXTREMITY TREATMENT    Patient Name: Barbara Patton MRN: [id number] DOB:07/09/1940, 82 y.o., female Today's Date: 12/15/2022  END OF SESSION:  PT End of Session - 12/15/22 1030     Visit Number 12    Number of Visits 15    Date for PT Re-Evaluation 12/25/22    Authorization Type [employer] MCR/GEHA    Authorization Time Period 12 visits until 7/22    Authorization - Number of Visits 12    Progress Note Due on Visit 18    PT Start Time 1015    PT Stop Time 1100    PT Time Calculation (min) 45 min    Activity Tolerance Patient tolerated treatment well    Behavior During Therapy WFL for tasks assessed/performed                   Past Medical History:  Diagnosis Date   Glaucoma    High cholesterol    Hypertension    Lupus (HCC)    Thyroid disease    Past Surgical History:  Procedure Laterality Date   ABDOMINAL HYSTERECTOMY     CATARACT EXTRACTION, BILATERAL     TOTAL KNEE ARTHROPLASTY Bilateral    TUBAL LIGATION     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   Referred otalgia of left ear 08/23/2015   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   Allergic rhinitis 07/26/2015   Anxiety 07/26/2015   Benign essential hypertension 07/26/2015   Dysfunction of left eustachian tube 07/26/2015   Esophageal reflux 07/26/2015   Fibromyalgia 07/26/2015   Pure hypercholesterolemia 07/26/2015   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 05/03/2014   Pes anserinus bursitis of left knee 12/04/2013    Trochanteric bursitis of left hip 08/07/2013   Leg swelling 12/06/2011   Glaucoma 11/02/2011   HLD (hyperlipidemia) 11/02/2011   HTN (hypertension) 11/02/2011   Hypothyroid 11/02/2011   OA (osteoarthritis) 11/02/2011   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 Gait training Balance Fall Prevention THERAPY DIAG:  Other low back pain  Muscle weakness (generalized)  Difficulty in walking, not elsewhere classified  Unsteadiness on feet  Abnormal posture  Rationale for Evaluation and Treatment: Rehabilitation  ONSET DATE: chronic "its been awhile"   SUBJECTIVE:   SUBJECTIVE STATEMENT: She reports still with the same pain, did have 2 recent minor falls where she was bending over and fell. The pain meds help with pain but then when she goes to walk the pain returns.   PERTINENT HISTORY: Anemia, tremor, B sensorineural hearing loss, TMJ pain, HTN, fibromyalgia, HLD, TKR + revision, anxiety, trochanteric bursitis, glaucoma, hypothyroidism, OA PAIN:  Are you having pain? Yes: NPRS scale:  5 today overall /10 Pain location: right hip and leg  Pain description: ache  Aggravating factors: "nothing just starts  hurting", starts hurting in standing  Relieving factors: CBD cream  PRECAUTIONS: Back due to acute/subacute compression fractures found on MRI 10/19/22  WEIGHT BEARING RESTRICTIONS: No  FALLS:  Has patient fallen in last 6 months? Yes. Number of falls 2- first fall was at 2am, fell trying to pick a candy wrapper off the floor; second fall fell forward trying to get off of commode. FOF +   LIVING ENVIRONMENT: Lives with: lives with their family Lives in: House/apartment Stairs: 1 STE, no steps inside  Has following equipment at  home: Single point cane and Environmental consultant - 2 wheeled  OCCUPATION: retired   PLOF: Independent, Independent with basic ADLs, Independent with household mobility with device, Independent with gait, and Independent with transfers  PATIENT GOALS: "to help my back and R LE I don't know if PT will help or"   NEXT MD VISIT: None scheduled with referring   OBJECTIVE:   DIAGNOSTIC FINDINGS:   FINDINGS: No evidence for an acute fracture in the bony pelvis. SI joints and symphysis pubis unremarkable. Joint space in the right hip is relatively well preserved. There is loss of joint space in the left hip with degenerative spurring. No evidence for left femoral neck fracture.   IMPRESSION: 1. No acute bony findings. 2. Degenerative changes in the left hip.  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 Chronic superior endplate fractures at L1 and L3. Extensive atheromatous calcification of the aorta and iliacs.   IMPRESSION: 1. No acute finding. 2. 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  THORACIC SPINE 2 VIEWS   COMPARISON:  None Available.   FINDINGS: No acute fracture or traumatic malalignment. Degenerative disc narrowing with endplate spurring in the mid and lower thoracic spine. Maintained posterior mediastinal fat planes. Mild thoracic levocurvature.   IMPRESSION: Degenerative changes without acute or focal finding.  Three-view radiographs of her right knee were reviewed today she is status  post total right knee arthroplasty components remain in stable position  when compared to previous x-rays.  No evidence of any periprosthetic  fracture    EXAM: MRI LUMBAR SPINE WITHOUT CONTRAST   TECHNIQUE: Multiplanar, multisequence MR imaging of the lumbar spine was performed. No intravenous contrast was administered.   COMPARISON:  Radiographs 10/01/2022 and 07/31/2022.   MRI 12/04/2019.   FINDINGS: Segmentation: Conventional anatomy assumed, with the last open disc space designated L5-S1.Concordant with prior imaging.   Alignment: Grade 1 anterolisthesis at L3-4 and L4-5, similar to previous MRI.   Vertebrae: There are mild superior endplate compression fractures at the L3 and L4 levels, resulting in approximately 10% loss of vertebral body height. There is associated bone marrow edema, but no osseous retropulsion or definite involvement of the posterior elements. These fractures are not clearly seen on recent or prior radiographs. No other acute osseous findings. The visualized sacroiliac joints appear unremarkable.   Conus medullaris: Extends to the L1-2 level and appears normal.   Paraspinal and other soft tissues: No acute paraspinal findings. The urinary bladder is noted to be distended and moderately trabeculated. 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   Disc levels:   Sagittal images demonstrate no significant disc space findings within the visualized lower thoracic spine. There is mild disc bulging at T11-12 and T12-L1.   L1-2: Preserved disc height. Mild facet hypertrophy. No spinal stenosis or nerve root encroachment.   L2-3: Preserved disc height with mild disc bulging and mild-to-moderate facet hypertrophy. No spinal  stenosis or nerve root encroachment.   L3-4: Moderate to severe facet hypertrophy accounts for the grade 1 anterolisthesis and contributes to annular disc bulging and uncovering. There is resulting moderate multifactorial spinal stenosis with mild lateral recess and minimal foraminal narrowing bilaterally. The spinal stenosis has progressed from the previous MRI   L4-5: Moderate to severe facet hypertrophy accounts for the grade 1 anterolisthesis and contributes to annular disc bulging and uncovering. There is only resulting borderline spinal stenosis at this level with minimal left lateral recess and  left foraminal narrowing. No nerve root encroachment. No significant changes are seen at this level.   L5-S1: Chronic disc bulging with endplate osteophytes asymmetric to the left. Mild facet hypertrophy. No spinal stenosis or nerve root encroachment.   IMPRESSION: 1. Acute/subacute mild superior endplate compression fractures at L3 and L4 with associated bone marrow edema, but no osseous retropulsion. 2. 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 3. Borderline spinal stenosis at L4-5 secondary to facet hypertrophy and a grade 1 anterolisthesis. 4. Distended urinary bladder with trabeculation, suggesting chronic bladder outlet obstruction.      PATIENT SURVEYS:  Eval FOTO 49, predicted 53 in 13 visits  11/27/22 FOTO improved to 59% and met goal   COGNITION: Overall cognitive status: Within functional limits for tasks assessed     SENSATION: "My feet feel numb all the time but not my legs, like up under the bottoms"     POSTURE: rounded shoulders, forward head, decreased lumbar lordosis, increased thoracic kyphosis, and flexed trunk   MUSCLE LENGTH  Piriformis severe limitation B; 11/03/22  R moderate limitation/L severe limitation  HS R moderate limitation, severe limitation L; 11/03/22 R mild limitation/L severe limitation    LUMBAR ROM  Lumbar flexion severe limitation  Lumbar extension mild limitation  Lumbar lateral flexion moderate limitation  B Lumbar rotation moderate limitation B       LOWER EXTREMITY MMT:  MMT Right eval Left eval Rt/Lt 11/27/22  Hip flexion 3 3- 4/4  Hip extension     Hip abduction 3- 3- 4/4  Hip adduction     Hip internal rotation     Hip external rotation     Knee flexion 4+ 4+   Knee extension 4+ 4+ 5/5  Ankle dorsiflexion 5 5   Ankle plantarflexion     Ankle inversion     Ankle eversion      (Blank rows = not tested)    FUNCTIONAL TESTS:  Eval 5 times sit to  stand: 30 seconds no UEs from standard chair Timed up and go (TUG): 22.5 seconds SPC                     Gait speed 1.13ft/second with [facility]   11/27/22  5 times sit to stand 18.8 seconds no UE'ss  Tug time 11.8 sec and met goal for this  GAIT: Distance walked: 6ft Assistive device utilized: Single point cane Level of assistance: Modified independence Comments: flexed at hips, limited step lengths/stance times, slightly unsteady even with SPC    TODAY'S TREATMENT  DATE: 12/15/22 Pt seen for aquatic therapy today.  Treatment took place in water 3.5-4.75 ft in depth at the [facility] pool. Temp of water was 91.  Pt entered/exited the pool via stairs with  rail.   Pt requires the buoyancy and hydrostatic pressure of water for support, and to offload joints by unweighting joint load by at least 50 % in navel deep water and by at least 75-80% in chest to neck deep water.  Viscosity of the water is needed for resistance of strengthening. Water current perturbations provides challenge to standing balance requiring increased core activation. Aquatic exercises Lateral walking, forward walking, retro walking 3 round trips and progressed to no UE support Leg swings add/abd X 15 bilat with UE support cues for slow and controlled movement Leg swings flexion/extension X 15 bilat with UE support cues for slow and controlled movements Marching with UE support X 15 reps bilat Hamstring curls with UE support X 15 reps bilat Standing lumbar flexion stretch with kickboard X 15 1/4 squat with  shoulder extension/flexion holding kickboard  X15 reps (progressed from back against the wall for more balance challenge) 1/4 squat with one dumbell moving into  3 ways steps (fwd, lateral, back) X 10 each leg without UE support SLS with hip circles X 10 bilat without UE support Squats with shoulder horizontal add with water Dumbells  2X10 Standing truck rotations with dumbells X10 bilat Standing reciprocal shoulder flexion/extension alternating X 10 bilat then switch lead legs and another 10 reps Lumbar L stretch holding on at pool rails X 10, holding 5 sec   12/04/22 Nustep L5 x10 minutes UE/LE, seat 10 Manual therapy for active compression and skilled palpation and Trigger Point Dry-Needling  Treatment instructions: Expect mild to moderate muscle soreness. Patient Consent Given: Yes Education handout provided: verbally provided Muscles treated: Lumbar paraspinals and multifidi,  Treatment response/outcome: good overall tolerance,twitch response noted  Seated lumbar flexion stretch 10 sec X 10 Seated pball isometric shoulder extension core activation 5 sec X 10 Seated pball isometric hip flexion core activation 5 sec X 10 Supine Single knee to chest stretch 20 sec X 3 bilat Supine low trunk rotations 10 sec X 3 bilat Supine piriformis stretch 20 sec X 3 bilat Bridges X 15 Standing hip ABD red TB x15 B Standing hip extensions x15 B red TB  Standing rows and extensions green X15 each     PATIENT EDUCATION:  Education details: exam findings, POC,  benefits of water therapy  Person educated: Patient Education method: Explanation, Demonstration, and Handouts Education comprehension: verbalized understanding, returned demonstration, and needs further education  HOME EXERCISE PROGRAM:  Access Code: [email or web address] URL: [email or web address] Date: 10/27/2022 Prepared by: Nedra Hai  Exercises - Supine Bridge  - 1 x daily - 7 x weekly - 1 sets - 10 reps - 2 hold - Supine Posterior Pelvic Tilt  - 1 x daily - 7 x weekly - 1 sets - 10 reps - 3 hold - Supine Figure 4 Piriformis Stretch  - 1 x daily - 7 x weekly - 1 sets - 3 reps - 30 hold - Hooklying Hamstring Stretch with Strap  - 1 x daily - 7 x weekly - 1 sets - 3 reps - 30 hold  WATER HEP Access Code: [email or web address] URL:  [email or web address] Date: 12/08/2022 Prepared by: Ivery Quale  Exercises - Forward Walking  - 1 x daily - 2 x weekly - 4 sets - Side Stepping  - 2 x daily - 2 x  weekly - 4 sets - 10 reps - Backward Walking  - 1 x daily - 2 x weekly - 4 sets - Standing March at [facility]  - 1 x daily - 2 x weekly - 15 reps - Standing Hip Flexion Extension at [employer]  - 1 x daily - 2 x weekly - 15 reps - Standing Hip Abduction Adduction at [employer]  - 1 x daily - 2 x weekly - 20 reps - Standing Knee Flexion  - 2 x daily - 6 x weekly - 3 sets - 10 reps - Squat  - 1 x daily - 2 x weekly - 15 reps - Lunge to Target at [employer]  - 1 x daily - 2 x weekly - 1 sets - 20 reps - Forward Monster Walk  - 1 x daily - 2 x weekly - 4 sets  ASSESSMENT:  CLINICAL IMPRESSION: I did add in more balance challenges today into her water program due to some recent falls when stooping over. She had good tolerance in session and despite not getting much pain relief we can at least work to improve strength and balance to improve functional abilities.    OBJECTIVE IMPAIRMENTS: Abnormal gait, decreased activity tolerance, decreased balance, decreased knowledge of use of DME, decreased mobility, difficulty walking, decreased ROM, decreased strength, hypomobility, increased fascial restrictions, impaired flexibility, impaired sensation, postural dysfunction, obesity, and pain.   ACTIVITY LIMITATIONS: standing, stairs, transfers, bed mobility, and locomotion level  PARTICIPATION LIMITATIONS: shopping, community activity, and yard work  PERSONAL FACTORS: Age, Behavior pattern, Fitness, Past/current experiences, Social background, and Time since onset of injury/illness/exacerbation are also affecting patient's functional outcome.   REHAB POTENTIAL: Fair chronicity of impairments, fibromyalgia, increased pain with activity at eval   CLINICAL DECISION MAKING: Stable/uncomplicated  EVALUATION COMPLEXITY:  Low   GOALS: Goals reviewed with patient? No  SHORT TERM GOALS: Target date: 11/03/22   Will be compliant with appropriate progressive HEP  Baseline: Goal status: MET 11/27/22  2.  Pain to be no more than 7/10 at worst  Baseline:  Goal status: Met 11/27/22 3.  Hamstring and piriformis flexibility to have improved by 50% to assist in improving mobility and reducing pain  Baseline:  Goal status: MET 11/27/22  4.  Lumbar AROM to have improved by 50% all directions in order to assist in improving mobility/reducing pain  Baseline:  Goal status: DEFERRED 11/03/22 due to new findings of acute/subacute  lumbar compression fractures    LONG TERM GOALS: Target date: 11/24/22    MMT to have improved by 1 grade in all weak groups  Baseline:  Goal status: onging 11/27/22  2.  Will complete 5x STS test in 15 seconds or less no UEs  Baseline:  Goal status: onging 11/27/22  3.  Will complete TUG test in 13 seconds or less with LRAD to show improved functional balance  Baseline:  Goal status: MET 11/27/22  4.  Pain to be no more than 5/10 at worst  Baseline:  Goal status: onging 11/27/22  5.  FOTO score to improved to at least predicted value  Baseline:  Goal status: MET 11/27/22   PLAN:  PT FREQUENCY: 2x/week  PT DURATION: 6 weeks  PLANNED INTERVENTIONS: Therapeutic exercises, Therapeutic activity, Neuromuscular re-education, Gait training, Self Care, Aquatic Therapy, Manual therapy, and Re-evaluation  PLAN FOR NEXT SESSION: Will need humana recert next visit. water therapy if helpful, continue with functional strength/endurance and if no significant improvements will refer back to MD.  Ivery Quale, PT, DPT 12/15/22 10:30 AM    Referring diagnosis?  M25.561,G89.29 (ICD-10-CM) - Chronic pain of right knee   Treatment diagnosis? (if different than referring diagnosis)   Muscle weakness (generalized) M62.81  Abnormal posture R29.3  Difficulty in walking, not elsewhere  classified R26.2  Pain in right leg M79.604  Unsteadiness on feet R26.81  Other low back pain M54.59  What was this (referring dx) caused by? []  Surgery [x]  Fall [x]  Ongoing issue []  Arthritis []  Other: ____________  Laterality: [x]  Rt []  Lt []  Both  Check all possible CPT codes:  *CHOOSE 10 OR LESS*    []  97110 (Therapeutic Exercise)  []  92507 (SLP Treatment)  []  97112 (Neuro Re-ed)   []  92526 (Swallowing Treatment)   []  97116 (Gait Training)   []  [id number] (Cognitive Training, 1st 15 minutes) []  97140 (Manual Therapy)   []  97130 (Cognitive Training, each add'l 15 minutes)  []  97164 (Re-evaluation)                              []  Other, List CPT Code ____________  []  97530 (Therapeutic Activities)     []  97535 (Self Care)   [x]  All codes above (97110 - 97535)  []  97012 (Mechanical Traction)  []  97014 (E-stim Unattended)  []  97032 (E-stim manual)  []  97033 (Ionto)  []  97035 (Ultrasound) []  97750 (Physical Performance Training) [x]  [id number] (Aquatic Therapy) []  97016 (Vasopneumatic Device) []  [id number] (Paraffin) []  97034 (Contrast Bath) []  97597 (Wound Care 1st 20 sq cm) []  97598 (Wound Care each add'l 20 sq cm) []  97760 (Orthotic Fabrication, Fitting, Training Initial) []  [id number] (Prosthetic Management and Training Initial) []  [id number] (Orthotic or Prosthetic Training/ Modification Subsequent)

## 2022-12-18 ENCOUNTER — Encounter: Payer: Self-pay | Admitting: Physical Therapy

## 2022-12-18 ENCOUNTER — Ambulatory Visit: Payer: Medicare PPO | Admitting: Physical Therapy

## 2022-12-18 DIAGNOSIS — R262 Difficulty in walking, not elsewhere classified: Secondary | ICD-10-CM

## 2022-12-18 DIAGNOSIS — M6281 Muscle weakness (generalized): Secondary | ICD-10-CM

## 2022-12-18 DIAGNOSIS — M5459 Other low back pain: Secondary | ICD-10-CM

## 2022-12-18 DIAGNOSIS — R2681 Unsteadiness on feet: Secondary | ICD-10-CM

## 2022-12-18 NOTE — Progress Notes (Unsigned)
Assessment/Plan:    1.  Hand pain and paresthesias  -EMG negative  -MRI cervical spine essentially unrevealing  -No evidence for a neurologic condition to have this and will need to follow-up with primary care if condition persists.  2.  Tremor  -Very little tremor noted on examination, but that was even prior to medicine.  However, she continues to c/o tremor  -d/c primidone  -start propranolol 20 mg bid.  Hope that this will help sensation of tremor and her BP as well.  Her review flowsheet indicates high BP's with time.  She was in ER with hypertensive emergency in summer.  -told pt that if above change doesn't help there is likely not much more that we can do   Subjective:   Barbara Patton was seen today.  We stopped her primidone.  We started her on propranolol.  She reports today that ***.  She was in the emergency room on April 28 after a fall.  She went down to pick up something to throw in the trash can and lost her balance and fell backwards onto her butt.  Emergency room workup was unremarkable and she was sent home.  She was in the urgent care again on June 4.  A wine bottle fell on her left great toe and a blister formed on the toe.  Emergency room workup demonstrated also a minimally displaced fracture of the distal phalanx of the great toe.  They also did an I&D of the blister area.  Current movement disorder medications: Primidone, 50 mg bid  Prior meds:  primidone   ALLERGIES:   Allergies  Allergen Reactions   Codeine Other (See Comments)    Kept awake   Penicillin G Other (See Comments)   Penicillins Hives    Did it involve swelling of the face/tongue/throat, SOB, or low BP? N Did it involve sudden or severe rash/hives, skin peeling, or any reaction on the inside of your mouth or nose? Y Did you need to seek medical attention at a hospital or doctor's office? N When did it last happen?   Over 10 Years Ago    If all above answers are "NO", may proceed with  cephalosporin use.     CURRENT MEDICATIONS:  Outpatient Encounter Medications as of 12/20/2022  Medication Sig   acetaminophen (TYLENOL) 325 MG tablet Take 650 mg by mouth 2 (two) times daily as needed (pain).   Cholecalciferol (VITAMIN D-3 PO) Take 1 tablet by mouth daily.   diazepam (VALIUM) 5 MG tablet Take one tablet by mouth with food one hour prior to procedure. May repeat 30 minutes prior if needed.   diclofenac Sodium (VOLTAREN) 1 % GEL Add'l Sig Add'l Sig topical Add'l Sig   DULoxetine (CYMBALTA) 30 MG capsule    furosemide (LASIX) 40 MG tablet Take 40 mg by mouth daily.   hydroxychloroquine (PLAQUENIL) 200 MG tablet Take 200 mg by mouth daily.    hydrOXYzine (ATARAX/VISTARIL) 10 MG tablet Take 10 mg by mouth every 6 (six) hours as needed for itching or anxiety.   levothyroxine (SYNTHROID) 100 MCG tablet Take 100 mcg by mouth daily.   Multiple Vitamins-Minerals (MULTIVITAMIN PO) Take 1 tablet by mouth daily.    mupirocin ointment (BACTROBAN) 2 % Apply 1 Application topically 2 (two) times daily. To affected area till better   omeprazole (PRILOSEC) 40 MG capsule Take 40 mg by mouth daily.    propranolol (INDERAL) 20 MG tablet Take 1 tablet (20 mg total) by  mouth 2 (two) times daily.   simvastatin (ZOCOR) 20 MG tablet Take 20 mg by mouth every evening.    traMADol (ULTRAM) 50 MG tablet Take 50 mg by mouth 2 (two) times daily as needed (pain).   [DISCONTINUED] estradiol (ESTRACE) 0.5 MG tablet    No facility-administered encounter medications on file as of 12/20/2022.     Objective:    PHYSICAL EXAMINATION:    VITALS:   There were no vitals filed for this visit.      GEN:  The patient appears stated age and is in NAD. HEENT:  Normocephalic, atraumatic.  The mucous membranes are moist. The superficial temporal arteries are without ropiness or tenderness.   Neurological examination:  Orientation: The patient is alert and oriented x3. Cranial nerves: There is good  facial symmetry. The speech is fluent and clear. Soft palate rises symmetrically and there is no tongue deviation. Hearing is intact to conversational tone. Sensation: Sensation is intact to light touch throughout Motor: Strength is at least antigravity x4.  Movement examination: Tone: There is normal tone in the bilateral upper extremities.  The tone in the lower extremities is normal.  Abnormal movements: There is no rest tremor, even with distraction procedures.  No postural tremor.  She has no intention tremor.  She draws archimedes spirals well and writes his name well. Coordination:  There is no decremation with RAM's, with any form of RAMS, including alternating supination and pronation of the forearm, hand opening and closing, finger taps, heel taps and toe taps  Total time spent on today's visit was *** minutes, including both face-to-face time and nonface-to-face time.  Time included that spent on review of records (prior notes available to me/labs/imaging if pertinent), discussing treatment and goals, answering patient's questions and coordinating care.   Cc:  Renford Dills, MD

## 2022-12-18 NOTE — Therapy (Signed)
OUTPATIENT PHYSICAL THERAPY LOWER EXTREMITY TREATMENT/[employer] Re-authorization    Patient Name: Barbara Patton MRN: [id number] DOB:02-24-1941, 82 y.o., female Today's Date: 12/18/2022  END OF SESSION:  PT End of Session - 12/18/22 0924     Visit Number 13    Number of Visits 20    Date for PT Re-Evaluation 01/29/23    Authorization Type [employer] MCR/GEHA    Authorization Time Period 12 visits until 7/22, reauth on 12/18/22    Authorization - Number of Visits 12    Progress Note Due on Visit 18    PT Start Time 0925    PT Stop Time 1010    PT Time Calculation (min) 45 min    Activity Tolerance Patient tolerated treatment well    Behavior During Therapy [facility] for tasks assessed/performed                   Past Medical History:  Diagnosis Date   Glaucoma    High cholesterol    Hypertension    Lupus (HCC)    Thyroid disease    Past Surgical History:  Procedure Laterality Date   ABDOMINAL HYSTERECTOMY     CATARACT EXTRACTION, BILATERAL     TOTAL KNEE ARTHROPLASTY Bilateral    TUBAL LIGATION     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   Referred otalgia of left ear 08/23/2015   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   Allergic rhinitis 07/26/2015   Anxiety 07/26/2015   Benign essential hypertension 07/26/2015   Dysfunction of left eustachian tube 07/26/2015   Esophageal reflux 07/26/2015   Fibromyalgia 07/26/2015   Pure hypercholesterolemia 07/26/2015   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 Gait training Balance Fall Prevention THERAPY DIAG:  Other low back pain  Muscle weakness (generalized)  Difficulty in walking, not elsewhere classified  Unsteadiness on feet  Rationale for Evaluation and Treatment: Rehabilitation  ONSET DATE: chronic "its been awhile"   SUBJECTIVE:   SUBJECTIVE STATEMENT: She reports still with the same pain, did have 2 recent minor falls where she was bending over and fell. The pain meds help with pain but then when she goes to walk the pain returns.   PERTINENT HISTORY: Anemia, tremor, B sensorineural hearing loss, TMJ pain, HTN, fibromyalgia, HLD, TKR + revision, anxiety, trochanteric bursitis, glaucoma, hypothyroidism, OA PAIN:  Are you having pain? Yes: NPRS scale:  5 today overall /10 Pain location: right hip and leg  Pain description: ache  Aggravating factors: "nothing just  starts hurting", starts hurting in standing  Relieving factors: CBD cream  PRECAUTIONS: Back due to acute/subacute compression fractures found on MRI 10/19/22  WEIGHT BEARING RESTRICTIONS: No  FALLS:  Has patient fallen in last 6 months? Yes. Number of falls 2- first fall was at 2am, fell trying to pick a candy wrapper off the floor; second fall fell forward trying to get off of commode. FOF +   LIVING ENVIRONMENT: Lives with: lives with their family Lives in: House/apartment Stairs: 1 STE, no steps inside   Has following equipment at home: Single point cane and Environmental consultant - 2 wheeled  OCCUPATION: retired   PLOF: Independent, Independent with basic ADLs, Independent with household mobility with device, Independent with gait, and Independent with transfers  PATIENT GOALS: "to help my back and R LE I don't know if PT will help or"   NEXT MD VISIT: None scheduled with referring   OBJECTIVE:   DIAGNOSTIC FINDINGS:   FINDINGS: No evidence for an acute fracture in the bony pelvis. SI joints and symphysis pubis unremarkable. Joint space in the right hip is relatively well preserved. There is loss of joint space in the left hip with degenerative spurring. No evidence for left femoral neck fracture.   IMPRESSION: 1. No acute bony findings. 2. Degenerative changes in the left hip.  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 Chronic superior endplate fractures at L1 and L3. Extensive atheromatous calcification of the aorta and iliacs.   IMPRESSION: 1. No acute finding. 2. 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  THORACIC SPINE 2 VIEWS   COMPARISON:  None Available.   FINDINGS: No acute fracture or traumatic malalignment. Degenerative disc narrowing with endplate spurring in the mid and lower thoracic spine. Maintained posterior mediastinal fat planes. Mild thoracic levocurvature.   IMPRESSION: Degenerative changes without acute or focal finding.  Three-view radiographs of her right knee were reviewed today she is status  post total right knee arthroplasty components remain in stable position  when compared to previous x-rays.  No evidence of any periprosthetic  fracture    EXAM: MRI LUMBAR SPINE WITHOUT CONTRAST   TECHNIQUE: Multiplanar, multisequence MR imaging of the lumbar spine was performed. No intravenous contrast was administered.   COMPARISON:  Radiographs  10/01/2022 and 07/31/2022.  MRI 12/04/2019.   FINDINGS: Segmentation: Conventional anatomy assumed, with the last open disc space designated L5-S1.Concordant with prior imaging.   Alignment: Grade 1 anterolisthesis at L3-4 and L4-5, similar to previous MRI.   Vertebrae: There are mild superior endplate compression fractures at the L3 and L4 levels, resulting in approximately 10% loss of vertebral body height. There is associated bone marrow edema, but no osseous retropulsion or definite involvement of the posterior elements. These fractures are not clearly seen on recent or prior radiographs. No other acute osseous findings. The visualized sacroiliac joints appear unremarkable.   Conus medullaris: Extends to the L1-2 level and appears normal.   Paraspinal and other soft tissues: No acute paraspinal findings. The urinary bladder is noted to be distended and moderately trabeculated. 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   Disc levels:   Sagittal images demonstrate no significant disc space findings within the visualized lower thoracic spine. There is mild disc bulging at T11-12 and T12-L1.   L1-2: Preserved disc height. Mild facet hypertrophy. No spinal stenosis or nerve root encroachment.   L2-3: Preserved disc height with mild disc bulging and mild-to-moderate facet hypertrophy. No  spinal stenosis or nerve root encroachment.   L3-4: Moderate to severe facet hypertrophy accounts for the grade 1 anterolisthesis and contributes to annular disc bulging and uncovering. There is resulting moderate multifactorial spinal stenosis with mild lateral recess and minimal foraminal narrowing bilaterally. The spinal stenosis has progressed from the previous MRI   L4-5: Moderate to severe facet hypertrophy accounts for the grade 1 anterolisthesis and contributes to annular disc bulging and uncovering. There is only resulting borderline spinal stenosis at this level with  minimal left lateral recess and left foraminal narrowing. No nerve root encroachment. No significant changes are seen at this level.   L5-S1: Chronic disc bulging with endplate osteophytes asymmetric to the left. Mild facet hypertrophy. No spinal stenosis or nerve root encroachment.   IMPRESSION: 1. Acute/subacute mild superior endplate compression fractures at L3 and L4 with associated bone marrow edema, but no osseous retropulsion. 2. 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 3. Borderline spinal stenosis at L4-5 secondary to facet hypertrophy and a grade 1 anterolisthesis. 4. Distended urinary bladder with trabeculation, suggesting chronic bladder outlet obstruction.      PATIENT SURVEYS:  Eval FOTO 49, predicted 53 in 13 visits  11/27/22 FOTO improved to 59% and met goal   COGNITION: Overall cognitive status: Within functional limits for tasks assessed     SENSATION: "My feet feel numb all the time but not my legs, like up under the bottoms"     POSTURE: rounded shoulders, forward head, decreased lumbar lordosis, increased thoracic kyphosis, and flexed trunk   MUSCLE LENGTH  Piriformis severe limitation B; 11/03/22  R moderate limitation/L severe limitation  HS R moderate limitation, severe limitation L; 11/03/22 R mild limitation/L severe limitation    LUMBAR ROM  Lumbar flexion severe limitation  Lumbar extension mild limitation  Lumbar lateral flexion moderate limitation  B Lumbar rotation moderate limitation B       LOWER EXTREMITY MMT:  MMT Right eval Left eval Rt/Lt 11/27/22  Hip flexion 3 3- 4/4  Hip extension     Hip abduction 3- 3- 4/4  Hip adduction     Hip internal rotation     Hip external rotation     Knee flexion 4+ 4+   Knee extension 4+ 4+ 5/5  Ankle dorsiflexion 5 5   Ankle plantarflexion     Ankle inversion     Ankle eversion      (Blank rows = not tested)    FUNCTIONAL  TESTS:  Eval 5 times sit to stand: 30 seconds no UEs from standard chair Timed up and go (TUG): 22.5 seconds SPC                     Gait speed 1.54ft/second with [facility]   11/27/22  5 times sit to stand 18.8 seconds no UE'ss  Tug time 11.8 sec and met goal for this  GAIT: Distance walked: 71ft Assistive device utilized: Single point cane Level of assistance: Modified independence Comments: flexed at hips, limited step lengths/stance times, slightly unsteady even with SPC    TODAY'S TREATMENT  DATE: 12/18/22 Nustep L5 x10 minutes UE/LE, seat 10 Seated lumbar flexion stretch 10 sec X 10 Seated pball isometric shoulder extension core activation 5 sec X 10 Seated pball isometric hip flexion core activation 5 sec X 10 Standing simulated bowling started with 2# ball X 10, then did 6# ball X 10 Standing hip ABD red TB x15 B Standing hip extensions x15 B red TB  Standing rows and extensions green X20 each Seated knee flexion machine 20# DL [id number] Seated knee extension machine 10# DL [id number] 6/57/84 Pt seen for aquatic therapy today.  Treatment took place in water 3.5-4.75 ft in depth at the [facility] pool. Temp of water was 91.  Pt entered/exited the pool via stairs with  rail.   Pt requires the buoyancy and hydrostatic pressure of water for support, and to offload joints by unweighting joint load by at least 50 % in navel deep water and by at least 75-80% in chest to neck deep water.  Viscosity of the water is needed for resistance of strengthening. Water current perturbations provides challenge to standing balance requiring increased core activation. Aquatic exercises Lateral walking, forward walking, retro walking 3 round trips and progressed to no UE support Leg swings add/abd X 15 bilat with UE support cues for slow and controlled movement Leg swings flexion/extension X 15 bilat with UE support cues for slow  and controlled movements Marching with UE support X 15 reps bilat Hamstring curls with UE support X 15 reps bilat Standing lumbar flexion stretch with kickboard X 15 1/4 squat with  shoulder extension/flexion holding kickboard  X15 reps (progressed from back against the wall for more balance challenge) 1/4 squat with one dumbell moving into  3 ways steps (fwd, lateral, back) X 10 each leg without UE support SLS with hip circles X 10 bilat without UE support Squats with shoulder horizontal add with water Dumbells 2X10 Standing truck rotations with dumbells X10 bilat Standing reciprocal shoulder flexion/extension alternating X 10 bilat then switch lead legs and another 10 reps Lumbar L stretch holding on at pool rails X 10, holding 5 sec     PATIENT EDUCATION:  Education details: exam findings, POC,  benefits of water therapy  Person educated: Patient Education method: Explanation, Demonstration, and Handouts Education comprehension: verbalized understanding, returned demonstration, and needs further education  HOME EXERCISE PROGRAM:  Access Code: [email or web address] URL: [email or web address] Date: 10/27/2022 Prepared by: Nedra Hai  Exercises - Supine Bridge  - 1 x daily - 7 x weekly - 1 sets - 10 reps - 2 hold - Supine Posterior Pelvic Tilt  - 1 x daily - 7 x weekly - 1 sets - 10 reps - 3 hold - Supine Figure 4 Piriformis Stretch  - 1 x daily - 7 x weekly - 1 sets - 3 reps - 30 hold - Hooklying Hamstring Stretch with Strap  - 1 x daily - 7 x weekly - 1 sets - 3 reps - 30 hold  WATER HEP Access Code: [email or web address] URL: [email or web address] Date: 12/08/2022 Prepared by: Ivery Quale  Exercises - Forward Walking  - 1 x daily - 2 x weekly - 4 sets - Side Stepping  - 2 x daily - 2 x weekly - 4 sets - 10 reps - Backward Walking  - 1 x daily - 2 x weekly - 4 sets - Standing March at [facility]  - 1 x daily - 2 x weekly - 15 reps - Standing Hip Flexion Extension  at [employer]  -  1 x daily - 2 x weekly - 15 reps - Standing Hip Abduction Adduction at Pool Wall  - 1 x daily - 2 x weekly - 20 reps - Standing Knee Flexion  - 2 x daily - 6 x weekly - 3 sets - 10 reps - Squat  - 1 x daily - 2 x weekly - 15 reps - Lunge to [employer] at [employer]  - 1 x daily - 2 x weekly - 1 sets - 20 reps - Forward Monster Walk  - 1 x daily - 2 x weekly - 4 sets  ASSESSMENT:  CLINICAL IMPRESSION: [employer] reauthorization sent today as she was at visit 12 of her initial 12 authorized visits. She has made some functional progress but still with pain and has not yet met PT goals so PT recommending to continue with PT. Even if we do not help with pain I do still feel we can help her with strength, endurance, and balance to improve function. She wants to be able to return to her bowling league in September so we did begin some simulated bowling activities which are good for her balance training anyway.   OBJECTIVE IMPAIRMENTS: Abnormal gait, decreased activity tolerance, decreased balance, decreased knowledge of use of DME, decreased mobility, difficulty walking, decreased ROM, decreased strength, hypomobility, increased fascial restrictions, impaired flexibility, impaired sensation, postural dysfunction, obesity, and pain.   ACTIVITY LIMITATIONS: standing, stairs, transfers, bed mobility, and locomotion level  PARTICIPATION LIMITATIONS: shopping, community activity, and yard work  PERSONAL FACTORS: Age, Behavior pattern, Fitness, Past/current experiences, Social background, and Time since onset of injury/illness/exacerbation are also affecting patient's functional outcome.   REHAB POTENTIAL: Fair chronicity of impairments, fibromyalgia, increased pain with activity at eval   CLINICAL DECISION MAKING: Stable/uncomplicated  EVALUATION COMPLEXITY: Low   GOALS: Goals reviewed with patient? No  SHORT TERM GOALS: Target date: 11/03/22   Will be compliant with appropriate  progressive HEP  Baseline: Goal status: MET 11/27/22  2.  Pain to be no more than 7/10 at worst  Baseline:  Goal status: Met 11/27/22 3.  Hamstring and piriformis flexibility to have improved by 50% to assist in improving mobility and reducing pain  Baseline:  Goal status: MET 11/27/22  4.  Lumbar AROM to have improved by 50% all directions in order to assist in improving mobility/reducing pain  Baseline:  Goal status: DEFERRED 11/03/22 due to new findings of acute/subacute  lumbar compression fractures    LONG TERM GOALS: Target date: 01/29/23    MMT to have improved by 1 grade in all weak groups  Baseline:  Goal status: onging 11/27/22  2.  Will complete 5x STS test in 15 seconds or less no UEs  Baseline:  Goal status: onging 11/27/22  3.  Will complete TUG test in 13 seconds or less with LRAD to show improved functional balance  Baseline:  Goal status: MET 11/27/22  4.  Pain to be no more than 5/10 at worst  Baseline:  Goal status: onging 11/27/22  5.  FOTO score to improved to at least predicted value  Baseline:  Goal status: MET 11/27/22   PLAN:  PT FREQUENCY: 2x/week  PT DURATION: 6 weeks  PLANNED INTERVENTIONS: Therapeutic exercises, Therapeutic activity, Neuromuscular re-education, Gait training, Self Care, Aquatic Therapy, Manual therapy, and Re-evaluation  PLAN FOR NEXT SESSION: look for human recert info, work towards goal of bowling in September.  Ivery Quale, PT, DPT 12/18/22 9:25 AM    Referring diagnosis?  M25.561,G89.29 (ICD-10-CM) -  Chronic pain of right knee   Treatment diagnosis? (if different than referring diagnosis)   Muscle weakness (generalized) M62.81  Abnormal posture R29.3  Difficulty in walking, not elsewhere classified R26.2  Pain in right leg M79.604  Unsteadiness on feet R26.81  Other low back pain M54.59  What was this (referring dx) caused by? []  Surgery [x]  Fall [x]  Ongoing issue [x]  Arthritis []  Other:  ____________  Laterality: [x]  Rt []  Lt []  Both  Check all possible CPT codes:  *CHOOSE 10 OR LESS*    []  97110 (Therapeutic Exercise)  []  92507 (SLP Treatment)  []  97112 (Neuro Re-ed)   []  92526 (Swallowing Treatment)   []  97116 (Gait Training)   []  [id number] (Cognitive Training, 1st 15 minutes) []  97140 (Manual Therapy)   []  97130 (Cognitive Training, each add'l 15 minutes)  []  97164 (Re-evaluation)                              []  Other, List CPT Code ____________  []  97530 (Therapeutic Activities)     []  97535 (Self Care)   [x]  All codes above (97110 - 97535)  []  97012 (Mechanical Traction)  []  97014 (E-stim Unattended)  []  97032 (E-stim manual)  []  97033 (Ionto)  []  97035 (Ultrasound) []  97750 (Physical Performance Training) [x]  [id number] (Aquatic Therapy) []  97016 (Vasopneumatic Device) []  [id number] (Paraffin) []  97034 (Contrast Bath) []  97597 (Wound Care 1st 20 sq cm) []  82956 (Wound Care each add'l 20 sq cm) []  97760 (Orthotic Fabrication, Fitting, Training Initial) []  [id number] (Prosthetic Management and Training Initial) []  [id number] (Orthotic or Prosthetic Training/ Modification Subsequent)

## 2022-12-20 ENCOUNTER — Encounter: Payer: Self-pay | Admitting: Neurology

## 2022-12-20 ENCOUNTER — Ambulatory Visit (INDEPENDENT_AMBULATORY_CARE_PROVIDER_SITE_OTHER): Payer: Medicare PPO | Admitting: Neurology

## 2022-12-20 VITALS — BP 130/60 | HR 100 | Resp 18 | Wt 191.0 lb

## 2022-12-20 DIAGNOSIS — R251 Tremor, unspecified: Secondary | ICD-10-CM

## 2022-12-20 NOTE — Patient Instructions (Signed)
We discussed to STOP the propranolol.  I don't have any other medication I would recommend.  If you want an opinion elsewhere, we can send you.  Otherwise, we will see you as needed.  The physicians and staff at [facility] are committed to providing excellent care. You may receive a survey requesting feedback about your experience at our office. We strive to receive "very good" responses to the survey questions. If you feel that your experience would prevent you from giving the office a "very good " response, please contact our office to try to remedy the situation. We may be reached at [phone]. Thank you for taking the time out of your busy day to complete the survey.

## 2022-12-22 ENCOUNTER — Telehealth: Payer: Self-pay

## 2022-12-22 ENCOUNTER — Encounter: Payer: Self-pay | Admitting: Physical Therapy

## 2022-12-22 ENCOUNTER — Ambulatory Visit (INDEPENDENT_AMBULATORY_CARE_PROVIDER_SITE_OTHER): Payer: Medicare PPO | Admitting: Physical Therapy

## 2022-12-22 DIAGNOSIS — R262 Difficulty in walking, not elsewhere classified: Secondary | ICD-10-CM | POA: Diagnosis not present

## 2022-12-22 DIAGNOSIS — M6281 Muscle weakness (generalized): Secondary | ICD-10-CM | POA: Diagnosis not present

## 2022-12-22 DIAGNOSIS — M5459 Other low back pain: Secondary | ICD-10-CM | POA: Diagnosis not present

## 2022-12-22 DIAGNOSIS — R2681 Unsteadiness on feet: Secondary | ICD-10-CM

## 2022-12-22 NOTE — Therapy (Signed)
OUTPATIENT PHYSICAL THERAPY LOWER EXTREMITY TREATMENT/[employer] Re-authorization    Patient Name: Barbara Patton MRN: [id number] DOB:Nov 13, 1940, 82 y.o., female Today's Date: 12/22/2022  END OF SESSION:  PT End of Session - 12/22/22 1031     Visit Number 14    Number of Visits 20    Date for PT Re-Evaluation 01/29/23    Authorization Type [employer] MCR/GEHA    Authorization Time Period 12 visits until 7/22, reauth on 12/18/22    Authorization - Number of Visits 12    Progress Note Due on Visit 18    PT Start Time 1020    PT Stop Time 1100    PT Time Calculation (min) 40 min    Activity Tolerance Patient tolerated treatment well    Behavior During Therapy WFL for tasks assessed/performed                   Past Medical History:  Diagnosis Date   Glaucoma    High cholesterol    Hypertension    Lupus (HCC)    Thyroid disease    Past Surgical History:  Procedure Laterality Date   ABDOMINAL HYSTERECTOMY     CATARACT EXTRACTION, BILATERAL     TOTAL KNEE ARTHROPLASTY Bilateral    TUBAL LIGATION     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   Referred otalgia of left ear 08/23/2015   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   Allergic rhinitis 07/26/2015   Anxiety 07/26/2015   Benign essential hypertension 07/26/2015   Dysfunction of left eustachian tube 07/26/2015   Esophageal reflux 07/26/2015   Fibromyalgia 07/26/2015   Pure hypercholesterolemia 07/26/2015   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 Gait training Balance Fall Prevention THERAPY DIAG:  Other low back pain  Muscle weakness (generalized)  Difficulty in walking, not elsewhere classified  Unsteadiness on feet  Rationale for Evaluation and Treatment: Rehabilitation  ONSET DATE: chronic "its been awhile"   SUBJECTIVE:   SUBJECTIVE STATEMENT: She the pain has been bad in in her Rt thigh and left knee. Does not know if it is shooting, burning, N/T, can only describe it as pain.   PERTINENT HISTORY: Anemia, tremor, B sensorineural hearing loss, TMJ pain, HTN, fibromyalgia, HLD, TKR + revision, anxiety, trochanteric bursitis, glaucoma, hypothyroidism, OA PAIN:  Are you having pain? Yes: NPRS scale:  5 today overall /10 Pain location: right hip and leg  Pain description: ache  Aggravating factors: "nothing just starts hurting", starts hurting in standing  Relieving factors: CBD cream  PRECAUTIONS: Back due to acute/subacute compression fractures found on MRI 10/19/22  WEIGHT BEARING RESTRICTIONS: No  FALLS:  Has patient fallen in last 6 months? Yes. Number of falls 2- first fall was at 2am, fell trying to pick a candy wrapper off the floor; second fall fell forward trying to get off of commode. FOF +   LIVING ENVIRONMENT: Lives with: lives with their family Lives in: House/apartment Stairs: 1 STE, no steps inside  Has following equipment at home: Single point  cane and Environmental consultant - 2 wheeled  OCCUPATION: retired   PLOF: Independent, Independent with basic ADLs, Independent with household mobility with device, Independent with gait, and Independent with transfers  PATIENT GOALS: "to help my back and R LE I don't know if PT will help or"   NEXT MD VISIT: None scheduled with referring   OBJECTIVE:   DIAGNOSTIC FINDINGS:   FINDINGS: No evidence for an acute fracture in the bony pelvis. SI joints and symphysis pubis unremarkable. Joint space in the right hip is relatively well preserved. There is loss of joint space in the left hip with degenerative spurring. No evidence for left femoral neck fracture.   IMPRESSION: 1. No acute bony findings. 2. Degenerative changes in the left hip.  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 Chronic superior endplate fractures at L1 and L3. Extensive atheromatous calcification of the aorta and iliacs.   IMPRESSION: 1. No acute finding. 2. 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  THORACIC SPINE 2 VIEWS   COMPARISON:  None Available.   FINDINGS: No acute fracture or traumatic malalignment. Degenerative disc narrowing with endplate spurring in the mid and lower thoracic spine. Maintained posterior mediastinal fat planes. Mild thoracic levocurvature.   IMPRESSION: Degenerative changes without acute or focal finding.  Three-view radiographs of her right knee were reviewed today she is status  post total right knee arthroplasty components remain in stable position  when compared to previous x-rays.  No evidence of any periprosthetic  fracture    EXAM: MRI LUMBAR SPINE WITHOUT CONTRAST   TECHNIQUE: Multiplanar, multisequence MR imaging of the lumbar spine was performed. No intravenous contrast was administered.   COMPARISON:  Radiographs 10/01/2022 and 07/31/2022.  MRI 12/04/2019.    FINDINGS: Segmentation: Conventional anatomy assumed, with the last open disc space designated L5-S1.Concordant with prior imaging.   Alignment: Grade 1 anterolisthesis at L3-4 and L4-5, similar to previous MRI.   Vertebrae: There are mild superior endplate compression fractures at the L3 and L4 levels, resulting in approximately 10% loss of vertebral body height. There is associated bone marrow edema, but no osseous retropulsion or definite involvement of the posterior elements. These fractures are not clearly seen on recent or prior radiographs. No other acute osseous findings. The visualized sacroiliac joints appear unremarkable.   Conus medullaris: Extends to the L1-2 level and appears normal.   Paraspinal and other soft tissues: No acute paraspinal findings. The urinary bladder is noted to be distended and moderately trabeculated. 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   Disc levels:   Sagittal images demonstrate no significant disc space findings within the visualized lower thoracic spine. There is mild disc bulging at T11-12 and T12-L1.   L1-2: Preserved disc height. Mild facet hypertrophy. No spinal stenosis or nerve root encroachment.   L2-3: Preserved disc height with mild disc bulging and mild-to-moderate facet hypertrophy. No spinal stenosis or nerve root encroachment.  L3-4: Moderate to severe facet hypertrophy accounts for the grade 1 anterolisthesis and contributes to annular disc bulging and uncovering. There is resulting moderate multifactorial spinal stenosis with mild lateral recess and minimal foraminal narrowing bilaterally. The spinal stenosis has progressed from the previous MRI   L4-5: Moderate to severe facet hypertrophy accounts for the grade 1 anterolisthesis and contributes to annular disc bulging and uncovering. There is only resulting borderline spinal stenosis at this level with minimal left lateral recess and left  foraminal narrowing. No nerve root encroachment. No significant changes are seen at this level.   L5-S1: Chronic disc bulging with endplate osteophytes asymmetric to the left. Mild facet hypertrophy. No spinal stenosis or nerve root encroachment.   IMPRESSION: 1. Acute/subacute mild superior endplate compression fractures at L3 and L4 with associated bone marrow edema, but no osseous retropulsion. 2. 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 3. Borderline spinal stenosis at L4-5 secondary to facet hypertrophy and a grade 1 anterolisthesis. 4. Distended urinary bladder with trabeculation, suggesting chronic bladder outlet obstruction.      PATIENT SURVEYS:  Eval FOTO 49, predicted 53 in 13 visits  11/27/22 FOTO improved to 59% and met goal   COGNITION: Overall cognitive status: Within functional limits for tasks assessed     SENSATION: "My feet feel numb all the time but not my legs, like up under the bottoms"     POSTURE: rounded shoulders, forward head, decreased lumbar lordosis, increased thoracic kyphosis, and flexed trunk   MUSCLE LENGTH  Piriformis severe limitation B; 11/03/22  R moderate limitation/L severe limitation  HS R moderate limitation, severe limitation L; 11/03/22 R mild limitation/L severe limitation    LUMBAR ROM  Lumbar flexion severe limitation  Lumbar extension mild limitation  Lumbar lateral flexion moderate limitation  B Lumbar rotation moderate limitation B       LOWER EXTREMITY MMT:  MMT Right eval Left eval Rt/Lt 11/27/22  Hip flexion 3 3- 4/4  Hip extension     Hip abduction 3- 3- 4/4  Hip adduction     Hip internal rotation     Hip external rotation     Knee flexion 4+ 4+   Knee extension 4+ 4+ 5/5  Ankle dorsiflexion 5 5   Ankle plantarflexion     Ankle inversion     Ankle eversion      (Blank rows = not tested)    FUNCTIONAL TESTS:  Eval 5 times sit to stand: 30  seconds no UEs from standard chair Timed up and go (TUG): 22.5 seconds SPC                     Gait speed 1.73ft/second with [facility]   11/27/22  5 times sit to stand 18.8 seconds no UE'ss  Tug time 11.8 sec and met goal for this  GAIT: Distance walked: 76ft Assistive device utilized: Single point cane Level of assistance: Modified independence Comments: flexed at hips, limited step lengths/stance times, slightly unsteady even with SPC    TODAY'S TREATMENT                                                                          DATE:  12/22/22 Pt seen for aquatic therapy today.  Treatment took place in water 3.5-4.75 ft in depth at the [facility] pool. Temp of water was 91.  Pt entered/exited the pool via stairs with  rail.   Pt requires the buoyancy and hydrostatic pressure of water for support, and to offload joints by unweighting joint load by at least 50 % in navel deep water and by at least 75-80% in chest to neck deep water.  Viscosity of the water is needed for resistance of strengthening. Water current perturbations provides challenge to standing balance requiring increased core activation. Aquatic exercises Lateral walking, forward walking, retro walking 3 round trips without UE support Leg swings add/abd X 15 bilat with UE support cues for slow and controlled movement Leg swings flexion/extension X 15 bilat with UE support cues for slow and controlled movements Bowling simulation with red water bell X 20, then added 2 forward steps before throw, X 15 1/4 squat with  shoulder extension/flexion holding kickboard  X15 reps (progressed from back against the wall for more balance challenge) 1/4 squat with push/pull X 15 3 ways steps (fwd, lateral, back) X 10 each leg without UE support Squats with shoulder horizontal add with water Dumbells X15 Standing arm rotation circles/stir the pot with dumbells X10 bilat Standing reciprocal shoulder flexion/extension alternating X 10  bilat then switch lead legs and another 10 reps Lumbar L stretch holding on at pool rails X 10, holding 5 sec  12/18/22 Nustep L5 x10 minutes UE/LE, seat 10 Seated lumbar flexion stretch 10 sec X 10 Seated pball isometric shoulder extension core activation 5 sec X 10 Seated pball isometric hip flexion core activation 5 sec X 10 Standing simulated bowling started with 2# ball X 10, then did 6# ball X 10 Standing hip ABD red TB x15 B Standing hip extensions x15 B red TB  Standing rows and extensions green X20 each Seated knee flexion machine 20# DL [id number] Seated knee extension machine 10# DL [id number]     PATIENT EDUCATION:  Education details: exam findings, POC,  benefits of water therapy  Person educated: Patient Education method: Explanation, Demonstration, and Handouts Education comprehension: verbalized understanding, returned demonstration, and needs further education  HOME EXERCISE PROGRAM:  Access Code: [email or web address] URL: [email or web address] Date: 10/27/2022 Prepared by: Nedra Hai  Exercises - Supine Bridge  - 1 x daily - 7 x weekly - 1 sets - 10 reps - 2 hold - Supine Posterior Pelvic Tilt  - 1 x daily - 7 x weekly - 1 sets - 10 reps - 3 hold - Supine Figure 4 Piriformis Stretch  - 1 x daily - 7 x weekly - 1 sets - 3 reps - 30 hold - Hooklying Hamstring Stretch with Strap  - 1 x daily - 7 x weekly - 1 sets - 3 reps - 30 hold  WATER HEP Access Code: [email or web address] URL: [email or web address] Date: 12/08/2022 Prepared by: Ivery Quale  Exercises - Forward Walking  - 1 x daily - 2 x weekly - 4 sets - Side Stepping  - 2 x daily - 2 x weekly - 4 sets - 10 reps - Backward Walking  - 1 x daily - 2 x weekly - 4 sets - Standing March at [facility]  - 1 x daily - 2 x weekly - 15 reps - Standing Hip Flexion Extension at [employer]  - 1 x daily - 2 x weekly - 15 reps - Standing Hip Abduction Adduction at [employer]  -  1 x daily - 2 x weekly - 20 reps - Standing  Knee Flexion  - 2 x daily - 6 x weekly - 3 sets - 10 reps - Squat  - 1 x daily - 2 x weekly - 15 reps - Lunge to [employer] at [employer]  - 1 x daily - 2 x weekly - 1 sets - 20 reps - Forward Monster Walk  - 1 x daily - 2 x weekly - 4 sets  ASSESSMENT:  CLINICAL IMPRESSION: We continued with water exercises for strength, balance, and bowling simulation today. She does continue to have pain that has not resolved much with PT and was encouraged to reach out to her Dr. Again.   OBJECTIVE IMPAIRMENTS: Abnormal gait, decreased activity tolerance, decreased balance, decreased knowledge of use of DME, decreased mobility, difficulty walking, decreased ROM, decreased strength, hypomobility, increased fascial restrictions, impaired flexibility, impaired sensation, postural dysfunction, obesity, and pain.   ACTIVITY LIMITATIONS: standing, stairs, transfers, bed mobility, and locomotion level  PARTICIPATION LIMITATIONS: shopping, community activity, and yard work  PERSONAL FACTORS: Age, Behavior pattern, Fitness, Past/current experiences, Social background, and Time since onset of injury/illness/exacerbation are also affecting patient's functional outcome.   REHAB POTENTIAL: Fair chronicity of impairments, fibromyalgia, increased pain with activity at eval   CLINICAL DECISION MAKING: Stable/uncomplicated  EVALUATION COMPLEXITY: Low   GOALS: Goals reviewed with patient? No  SHORT TERM GOALS: Target date: 11/03/22   Will be compliant with appropriate progressive HEP  Baseline: Goal status: MET 11/27/22  2.  Pain to be no more than 7/10 at worst  Baseline:  Goal status: Met 11/27/22 3.  Hamstring and piriformis flexibility to have improved by 50% to assist in improving mobility and reducing pain  Baseline:  Goal status: MET 11/27/22  4.  Lumbar AROM to have improved by 50% all directions in order to assist in improving mobility/reducing pain  Baseline:  Goal status: DEFERRED 11/03/22 due to new  findings of acute/subacute  lumbar compression fractures    LONG TERM GOALS: Target date: 01/29/23    MMT to have improved by 1 grade in all weak groups  Baseline:  Goal status: onging 11/27/22  2.  Will complete 5x STS test in 15 seconds or less no UEs  Baseline:  Goal status: onging 11/27/22  3.  Will complete TUG test in 13 seconds or less with LRAD to show improved functional balance  Baseline:  Goal status: MET 11/27/22  4.  Pain to be no more than 5/10 at worst  Baseline:  Goal status: onging 11/27/22  5.  FOTO score to improved to at least predicted value  Baseline:  Goal status: MET 11/27/22   PLAN:  PT FREQUENCY: 2x/week  PT DURATION: 6 weeks  PLANNED INTERVENTIONS: Therapeutic exercises, Therapeutic activity, Neuromuscular re-education, Gait training, Self Care, Aquatic Therapy, Manual therapy, and Re-evaluation  PLAN FOR NEXT SESSION: look for human recert info, work towards goal of bowling in September.  Ivery Quale, PT, DPT 12/22/22 10:32 AM    Referring diagnosis?  M25.561,G89.29 (ICD-10-CM) - Chronic pain of right knee   Treatment diagnosis? (if different than referring diagnosis)   Muscle weakness (generalized) M62.81  Abnormal posture R29.3  Difficulty in walking, not elsewhere classified R26.2  Pain in right leg M79.604  Unsteadiness on feet R26.81  Other low back pain M54.59  What was this (referring dx) caused by? []  Surgery [x]  Fall [x]  Ongoing issue [x]  Arthritis []  Other: ____________  Laterality: [x]  Rt []  Lt []  Both  Check all possible CPT codes:  *CHOOSE 10 OR LESS*    []  78295 (Therapeutic Exercise)  []  92507 (SLP Treatment)  []  97112 (Neuro Re-ed)   []  92526 (Swallowing Treatment)   []  97116 (Gait Training)   []  [id number] (Cognitive Training, 1st 15 minutes) []  97140 (Manual Therapy)   []  97130 (Cognitive Training, each add'l 15 minutes)  []  97164 (Re-evaluation)                              []  Other, List CPT Code  ____________  []  97530 (Therapeutic Activities)     []  97535 (Self Care)   [x]  All codes above (97110 - 97535)  []  97012 (Mechanical Traction)  []  97014 (E-stim Unattended)  []  97032 (E-stim manual)  []  97033 (Ionto)  []  97035 (Ultrasound) []  97750 (Physical Performance Training) [x]  [id number] (Aquatic Therapy) []  97016 (Vasopneumatic Device) []  [id number] (Paraffin) []  97034 (Contrast Bath) []  97597 (Wound Care 1st 20 sq cm) []  97598 (Wound Care each add'l 20 sq cm) []  97760 (Orthotic Fabrication, Fitting, Training Initial) []  [id number] (Prosthetic Management and Training Initial) []  [id number] (Orthotic or Prosthetic Training/ Modification Subsequent)

## 2022-12-22 NOTE — Telephone Encounter (Signed)
Patient called in and stated she thinks she is having the same type of pain as before, but it is in her right thigh. She is doing PT and it is helping some. She wants to know what can be done to help the pain. She is not sure if she needs another injection or not. Last injection was 11/15/22. Please advise

## 2022-12-25 ENCOUNTER — Encounter: Payer: Medicare PPO | Admitting: Physical Therapy

## 2022-12-27 NOTE — Telephone Encounter (Signed)
Spoke with patient and informed her of the information below. Patient has an appointment with Dr. August Saucer on 01/04/23

## 2022-12-28 DIAGNOSIS — M329 Systemic lupus erythematosus, unspecified: Secondary | ICD-10-CM | POA: Diagnosis not present

## 2022-12-28 DIAGNOSIS — M5136 Other intervertebral disc degeneration, lumbar region: Secondary | ICD-10-CM | POA: Diagnosis not present

## 2022-12-28 DIAGNOSIS — E559 Vitamin D deficiency, unspecified: Secondary | ICD-10-CM | POA: Diagnosis not present

## 2022-12-28 DIAGNOSIS — M797 Fibromyalgia: Secondary | ICD-10-CM | POA: Diagnosis not present

## 2022-12-28 DIAGNOSIS — R21 Rash and other nonspecific skin eruption: Secondary | ICD-10-CM | POA: Diagnosis not present

## 2022-12-28 DIAGNOSIS — M1991 Primary osteoarthritis, unspecified site: Secondary | ICD-10-CM | POA: Diagnosis not present

## 2022-12-28 DIAGNOSIS — R220 Localized swelling, mass and lump, head: Secondary | ICD-10-CM | POA: Diagnosis not present

## 2022-12-28 DIAGNOSIS — H2011 Chronic iridocyclitis, right eye: Secondary | ICD-10-CM | POA: Diagnosis not present

## 2022-12-28 DIAGNOSIS — R5383 Other fatigue: Secondary | ICD-10-CM | POA: Diagnosis not present

## 2022-12-29 ENCOUNTER — Telehealth: Payer: Self-pay | Admitting: Neurology

## 2022-12-29 ENCOUNTER — Ambulatory Visit (INDEPENDENT_AMBULATORY_CARE_PROVIDER_SITE_OTHER): Payer: Medicare PPO | Admitting: Physical Therapy

## 2022-12-29 ENCOUNTER — Encounter: Payer: Self-pay | Admitting: Physical Therapy

## 2022-12-29 DIAGNOSIS — R262 Difficulty in walking, not elsewhere classified: Secondary | ICD-10-CM

## 2022-12-29 DIAGNOSIS — R2681 Unsteadiness on feet: Secondary | ICD-10-CM

## 2022-12-29 DIAGNOSIS — M6281 Muscle weakness (generalized): Secondary | ICD-10-CM | POA: Diagnosis not present

## 2022-12-29 DIAGNOSIS — M5459 Other low back pain: Secondary | ICD-10-CM

## 2022-12-29 NOTE — Telephone Encounter (Signed)
Patient came in last week stating the medication that given did not work, patient threw medication away, now her shaking in her hands has gotten worse/KB

## 2022-12-29 NOTE — Therapy (Signed)
OUTPATIENT PHYSICAL THERAPY LOWER EXTREMITY TREATMENT/Discharge PHYSICAL THERAPY DISCHARGE SUMMARY  Visits from Start of Care: 15  Current functional level related to goals / functional outcomes: See below   Remaining deficits: See below   Education / Equipment: HEP  Plan:  Patient goals were not met. Patient is being discharged due to lack of progress with her pain, she will follow up with MD on 01/04/23 and she has HEP she can continue       Patient Name: Barbara Patton MRN: [id number] DOB:1941-05-16, 82 y.o., female Today's Date: 12/29/2022  END OF SESSION:  PT End of Session - 12/29/22 1018     Visit Number 15    Number of Visits 20    Date for PT Re-Evaluation 01/29/23    Authorization Type [employer] MCR/GEHA    Authorization Time Period 12 visits until 7/22, reauth on 12/18/22    Authorization - Number of Visits 12    Progress Note Due on Visit 18    PT Start Time 1020    PT Stop Time 1100    PT Time Calculation (min) 40 min    Activity Tolerance Patient tolerated treatment well    Behavior During Therapy WFL for tasks assessed/performed                   Past Medical History:  Diagnosis Date   Glaucoma    High cholesterol    Hypertension    Lupus (HCC)    Thyroid disease    Past Surgical History:  Procedure Laterality Date   ABDOMINAL HYSTERECTOMY     CATARACT EXTRACTION, BILATERAL     TOTAL KNEE ARTHROPLASTY Bilateral    TUBAL LIGATION     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   Referred otalgia of left ear 08/23/2015   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    Allergic rhinitis 07/26/2015   Anxiety 07/26/2015   Benign essential hypertension 07/26/2015   Dysfunction of left eustachian tube 07/26/2015   Esophageal reflux 07/26/2015   Fibromyalgia 07/26/2015   Pure hypercholesterolemia 07/26/2015   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 05/03/2014   Pes anserinus bursitis of left knee 12/04/2013   Trochanteric bursitis of left hip 08/07/2013   Leg swelling 12/06/2011   Glaucoma 11/02/2011   HLD (hyperlipidemia) 11/02/2011   HTN (hypertension) 11/02/2011   Hypothyroid 11/02/2011   OA (osteoarthritis) 11/02/2011   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 Gait training Balance Fall Prevention THERAPY DIAG:  Other low back pain  Muscle weakness (generalized)  Difficulty in walking, not elsewhere classified  Unsteadiness on feet  Rationale for Evaluation and Treatment: Rehabilitation  ONSET DATE: chronic "its been awhile"   SUBJECTIVE:   SUBJECTIVE STATEMENT: She says the pain is not improving with PT and her exercises. She agrees to discharge from  PT today   PERTINENT HISTORY: Anemia, tremor, B sensorineural hearing loss, TMJ pain, HTN, fibromyalgia, HLD, TKR + revision, anxiety, trochanteric bursitis, glaucoma, hypothyroidism, OA PAIN:  Are you having pain? Yes: NPRS scale:  8 today overall /10 Pain location: back and legs Pain description: ache  Aggravating factors: "nothing just starts hurting", starts hurting in standing  Relieving factors: CBD cream  PRECAUTIONS: Back due to acute/subacute compression fractures found on MRI 10/19/22  WEIGHT BEARING RESTRICTIONS: No  FALLS:  Has patient fallen in last 6  months? Yes. Number of falls 2- first fall was at 2am, fell trying to pick a candy wrapper off the floor; second fall fell forward trying to get off of commode. FOF +   LIVING ENVIRONMENT: Lives with: lives with their family Lives in: House/apartment Stairs: 1 STE, no steps inside  Has following equipment at home: Single point cane and Environmental consultant - 2 wheeled  OCCUPATION: retired   PLOF: Independent, Independent with basic ADLs, Independent with household mobility with device, Independent with gait, and Independent with transfers  PATIENT GOALS: "to help my back and R LE I don't know if PT will help or"   NEXT MD VISIT: None scheduled with referring   OBJECTIVE:   DIAGNOSTIC FINDINGS:   FINDINGS: No evidence for an acute fracture in the bony pelvis. SI joints and symphysis pubis unremarkable. Joint space in the right hip is relatively well preserved. There is loss of joint space in the left hip with degenerative spurring. No evidence for left femoral neck fracture.   IMPRESSION: 1. No acute bony findings. 2. Degenerative changes in the left hip.  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 Chronic superior endplate fractures at L1 and L3. Extensive atheromatous calcification of the aorta and iliacs.   IMPRESSION: 1. No acute finding. 2. 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  THORACIC SPINE 2 VIEWS   COMPARISON:  None Available.   FINDINGS: No acute fracture or traumatic malalignment. Degenerative disc narrowing with endplate spurring in the mid and lower thoracic spine. Maintained posterior mediastinal fat planes. Mild thoracic levocurvature.   IMPRESSION: Degenerative changes without acute or focal finding.  Three-view radiographs of her right knee were reviewed today she is status  post total right knee arthroplasty components remain in stable position   when compared to previous x-rays.  No evidence of any periprosthetic  fracture    EXAM: MRI LUMBAR SPINE WITHOUT CONTRAST   TECHNIQUE: Multiplanar, multisequence MR imaging of the lumbar spine was performed. No intravenous contrast was administered.   COMPARISON:  Radiographs 10/01/2022 and 07/31/2022.  MRI 12/04/2019.   FINDINGS: Segmentation: Conventional anatomy assumed, with the last open disc space designated L5-S1.Concordant with prior imaging.   Alignment: Grade 1 anterolisthesis at L3-4 and L4-5, similar to previous MRI.   Vertebrae: There are mild superior endplate compression fractures at the L3 and L4 levels, resulting in approximately 10% loss of vertebral body height. There is associated bone marrow edema, but no osseous retropulsion or definite involvement of the posterior elements. These fractures are not clearly seen on recent or prior radiographs. No other acute osseous findings. The visualized sacroiliac joints appear unremarkable.   Conus medullaris: Extends to the L1-2 level and appears normal.   Paraspinal and other soft tissues: No acute paraspinal findings. The urinary bladder is noted to be distended and moderately trabeculated. 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   Disc levels:   Sagittal  images demonstrate no significant disc space findings within the visualized lower thoracic spine. There is mild disc bulging at T11-12 and T12-L1.   L1-2: Preserved disc height. Mild facet hypertrophy. No spinal stenosis or nerve root encroachment.   L2-3: Preserved disc height with mild disc bulging and mild-to-moderate facet hypertrophy. No spinal stenosis or nerve root encroachment.   L3-4: Moderate to severe facet hypertrophy accounts for the grade 1 anterolisthesis and contributes to annular disc bulging and uncovering. There is resulting moderate multifactorial spinal stenosis with mild lateral recess and minimal foraminal  narrowing bilaterally. The spinal stenosis has progressed from the previous MRI   L4-5: Moderate to severe facet hypertrophy accounts for the grade 1 anterolisthesis and contributes to annular disc bulging and uncovering. There is only resulting borderline spinal stenosis at this level with minimal left lateral recess and left foraminal narrowing. No nerve root encroachment. No significant changes are seen at this level.   L5-S1: Chronic disc bulging with endplate osteophytes asymmetric to the left. Mild facet hypertrophy. No spinal stenosis or nerve root encroachment.   IMPRESSION: 1. Acute/subacute mild superior endplate compression fractures at L3 and L4 with associated bone marrow edema, but no osseous retropulsion. 2. 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 3. Borderline spinal stenosis at L4-5 secondary to facet hypertrophy and a grade 1 anterolisthesis. 4. Distended urinary bladder with trabeculation, suggesting chronic bladder outlet obstruction.      PATIENT SURVEYS:  Eval FOTO 49, predicted 53 in 13 visits  11/27/22 FOTO improved to 59% and met goal   COGNITION: Overall cognitive status: Within functional limits for tasks assessed     SENSATION: "My feet feel numb all the time but not my legs, like up under the bottoms"     POSTURE: rounded shoulders, forward head, decreased lumbar lordosis, increased thoracic kyphosis, and flexed trunk   MUSCLE LENGTH  Piriformis severe limitation B; 11/03/22  R moderate limitation/L severe limitation  HS R moderate limitation, severe limitation L; 11/03/22 R mild limitation/L severe limitation    LUMBAR ROM  Lumbar flexion severe limitation  Lumbar extension mild limitation  Lumbar lateral flexion moderate limitation  B Lumbar rotation moderate limitation B       LOWER EXTREMITY MMT:  MMT Right eval Left eval Rt/Lt 11/27/22  Hip flexion 3 3- 4/4  Hip  extension     Hip abduction 3- 3- 4/4  Hip adduction     Hip internal rotation     Hip external rotation     Knee flexion 4+ 4+   Knee extension 4+ 4+ 5/5  Ankle dorsiflexion 5 5   Ankle plantarflexion     Ankle inversion     Ankle eversion      (Blank rows = not tested)    FUNCTIONAL TESTS:  Eval 5 times sit to stand: 30 seconds no UEs from standard chair Timed up and go (TUG): 22.5 seconds SPC                     Gait speed 1.1ft/second with [facility]   11/27/22  5 times sit to stand 18.8 seconds no UE'ss  Tug time 11.8 sec and met goal for this  GAIT: Distance walked: 51ft Assistive device utilized: Single point cane Level of assistance: Modified independence Comments: flexed at hips, limited step lengths/stance times, slightly unsteady even with SPC    TODAY'S TREATMENT  DATE: 12/29/22 Pt seen for aquatic therapy today.  Treatment took place in water 3.5-4.75 ft in depth at the [facility] pool. Temp of water was 91.  Pt entered/exited the pool via stairs with  rail.   Pt requires the buoyancy and hydrostatic pressure of water for support, and to offload joints by unweighting joint load by at least 50 % in navel deep water and by at least 75-80% in chest to neck deep water.  Viscosity of the water is needed for resistance of strengthening. Water current perturbations provides challenge to standing balance requiring increased core activation. Aquatic exercises Lateral walking, forward walking, retro walking 3 round trips without UE support Leg swings add/abd X 20 bilat with UE support cues for slow and controlled movement Leg swings flexion/extension X 20 bilat with UE support cues for slow and controlled movements Bowling simulation with red water bell X 20, then added 2 bells one in each arm for reciprocal flexion/extension X 15 bilat 1/4 squat with  shoulder extension/flexion holding kickboard   X15 reps (progressed from back against the wall for more balance challenge) 1/4 squat with push/pull X 15 3 ways steps (fwd, lateral, back) X 10 each leg without UE support Squats with shoulder horizontal add with water Dumbells X15 Standing arm rotation circles/stir the pot with dumbells X10 bilat Step ups onto first step of pool X 15 each bilat with UE support Squats at pool step holding onto rails X 15 Lumbar L stretch holding on at pool rails X 10, holding 5 sec  PATIENT EDUCATION:  Education details: exam findings, POC,  benefits of water therapy  Person educated: Patient Education method: Explanation, Demonstration, and Handouts Education comprehension: verbalized understanding, returned demonstration, and needs further education  HOME EXERCISE PROGRAM:  Access Code: [email or web address] URL: [email or web address] Date: 10/27/2022 Prepared by: Nedra Hai  Exercises - Supine Bridge  - 1 x daily - 7 x weekly - 1 sets - 10 reps - 2 hold - Supine Posterior Pelvic Tilt  - 1 x daily - 7 x weekly - 1 sets - 10 reps - 3 hold - Supine Figure 4 Piriformis Stretch  - 1 x daily - 7 x weekly - 1 sets - 3 reps - 30 hold - Hooklying Hamstring Stretch with Strap  - 1 x daily - 7 x weekly - 1 sets - 3 reps - 30 hold  WATER HEP Access Code: [email or web address] URL: [email or web address] Date: 12/08/2022 Prepared by: Ivery Quale  Exercises - Forward Walking  - 1 x daily - 2 x weekly - 4 sets - Side Stepping  - 2 x daily - 2 x weekly - 4 sets - 10 reps - Backward Walking  - 1 x daily - 2 x weekly - 4 sets - Standing March at [facility]  - 1 x daily - 2 x weekly - 15 reps - Standing Hip Flexion Extension at [employer]  - 1 x daily - 2 x weekly - 15 reps - Standing Hip Abduction Adduction at [employer]  - 1 x daily - 2 x weekly - 20 reps - Standing Knee Flexion  - 2 x daily - 6 x weekly - 3 sets - 10 reps - Squat  - 1 x daily - 2 x weekly - 15 reps - Lunge to [employer] at [employer]  - 1  x daily - 2 x weekly - 1 sets - 20 reps - Forward Monster Walk  - 1 x daily - 2 x weekly -  4 sets  ASSESSMENT:  CLINICAL IMPRESSION: She has had 15 PT visits. She made some initial functional progress but has now hit plateau with this. Progress has been limited by high amounts of pain that PT is not helping to reduce. We have exhausted our treatment interventions including lumbar mobility and strengthening exercises, core strengthening, general strength and endurance exercise,  Dry needling, and aquatic PT. She agrees to discharge from PT today and she will return to MD for further evaluation, and recommendations for her care regarding her pain managment. She cannot have another back injection for 3 months following her previous injection she reports.   OBJECTIVE IMPAIRMENTS: Abnormal gait, decreased activity tolerance, decreased balance, decreased knowledge of use of DME, decreased mobility, difficulty walking, decreased ROM, decreased strength, hypomobility, increased fascial restrictions, impaired flexibility, impaired sensation, postural dysfunction, obesity, and pain.   ACTIVITY LIMITATIONS: standing, stairs, transfers, bed mobility, and locomotion level  PARTICIPATION LIMITATIONS: shopping, community activity, and yard work  PERSONAL FACTORS: Age, Behavior pattern, Fitness, Past/current experiences, Social background, and Time since onset of injury/illness/exacerbation are also affecting patient's functional outcome.   REHAB POTENTIAL: Fair chronicity of impairments, fibromyalgia, increased pain with activity at eval   CLINICAL DECISION MAKING: Stable/uncomplicated  EVALUATION COMPLEXITY: Low   GOALS: Goals reviewed with patient? No  SHORT TERM GOALS: Target date: 11/03/22   Will be compliant with appropriate progressive HEP  Baseline: Goal status: MET 11/27/22  2.  Pain to be no more than 7/10 at worst  Baseline:  Goal status: Met 11/27/22 3.  Hamstring and piriformis  flexibility to have improved by 50% to assist in improving mobility and reducing pain  Baseline:  Goal status: MET 11/27/22  4.  Lumbar AROM to have improved by 50% all directions in order to assist in improving mobility/reducing pain  Baseline:  Goal status: DEFERRED 11/03/22 due to new findings of acute/subacute  lumbar compression fractures    LONG TERM GOALS: Target date: 01/29/23    MMT to have improved by 1 grade in all weak groups  Baseline:  Goal status: onging 11/27/22  2.  Will complete 5x STS test in 15 seconds or less no UEs  Baseline:  Goal status: onging 11/27/22  3.  Will complete TUG test in 13 seconds or less with LRAD to show improved functional balance  Baseline:  Goal status: MET 11/27/22  4.  Pain to be no more than 5/10 at worst  Baseline:  Goal status: onging 11/27/22  5.  FOTO score to improved to at least predicted value  Baseline:  Goal status: MET 11/27/22   PLAN:  PT FREQUENCY: 2x/week  PT DURATION: 6 weeks  PLANNED INTERVENTIONS: Therapeutic exercises, Therapeutic activity, Neuromuscular re-education, Gait training, Self Care, Aquatic Therapy, Manual therapy, and Re-evaluation  PLAN FOR NEXT SESSION: DC and refer back to MD  Ivery Quale, PT, DPT 12/29/22 10:19 AM    Referring diagnosis?  M25.561,G89.29 (ICD-10-CM) - Chronic pain of right knee   Treatment diagnosis? (if different than referring diagnosis)   Muscle weakness (generalized) M62.81  Abnormal posture R29.3  Difficulty in walking, not elsewhere classified R26.2  Pain in right leg M79.604  Unsteadiness on feet R26.81  Other low back pain M54.59  What was this (referring dx) caused by? []  Surgery [x]  Fall [x]  Ongoing issue [x]  Arthritis []  Other: ____________  Laterality: [x]  Rt []  Lt []  Both  Check all possible CPT codes:  *CHOOSE 10 OR LESS*    []  97110 (Therapeutic Exercise)  []  16109 (  SLP Treatment)  []  [phone] (Neuro Re-ed)   []  92526 (Swallowing  Treatment)   []  97116 (Gait Training)   []  [phone] (Cognitive Training, 1st 15 minutes) []  97140 (Manual Therapy)   []  97130 (Cognitive Training, each add'l 15 minutes)  []  97164 (Re-evaluation)                              []  Other, List CPT Code ____________  []  97530 (Therapeutic Activities)     []  97535 (Self Care)   [x]  All codes above (97110 - 97535)  []  97012 (Mechanical Traction)  []  97014 (E-stim Unattended)  []  97032 (E-stim manual)  []  97033 (Ionto)  []  97035 (Ultrasound) []  97750 (Physical Performance Training) [x]  [id number] (Aquatic Therapy) []  97016 (Vasopneumatic Device) []  95284 (Paraffin) []  97034 (Contrast Bath) []  97597 (Wound Care 1st 20 sq cm) []  97598 (Wound Care each add'l 20 sq cm) []  97760 (Orthotic Fabrication, Fitting, Training Initial) []  [id number] (Prosthetic Management and Training Initial) []  [id number] (Orthotic or Prosthetic Training/ Modification Subsequent)

## 2023-01-01 ENCOUNTER — Other Ambulatory Visit: Payer: Self-pay

## 2023-01-01 DIAGNOSIS — R251 Tremor, unspecified: Secondary | ICD-10-CM

## 2023-01-01 MED ORDER — PRIMIDONE 50 MG PO TABS
50.0000 mg | ORAL_TABLET | Freq: Two times a day (BID) | ORAL | 0 refills | Status: DC
Start: 2023-01-01 — End: 2023-01-01

## 2023-01-01 MED ORDER — PRIMIDONE 50 MG PO TABS
50.0000 mg | ORAL_TABLET | Freq: Two times a day (BID) | ORAL | 0 refills | Status: DC
Start: 2023-01-01 — End: 2023-03-28

## 2023-01-01 NOTE — Telephone Encounter (Addendum)
When speaking to the patient her message was a little different than what was put in for her. Patient thought the medication was not working and she threw meds away. Patient would like to start back on medication as she feels it helped her a lot patient would like 1 weeks worth sent to local [facility] and the rest sent to [facility]. Patient told me she isn't drinking much

## 2023-01-01 NOTE — Telephone Encounter (Signed)
Medication has been sent in and patient called 6 month appointment scheduled

## 2023-01-04 ENCOUNTER — Other Ambulatory Visit (INDEPENDENT_AMBULATORY_CARE_PROVIDER_SITE_OTHER): Payer: Medicare PPO

## 2023-01-04 ENCOUNTER — Ambulatory Visit (INDEPENDENT_AMBULATORY_CARE_PROVIDER_SITE_OTHER): Payer: Medicare PPO | Admitting: Orthopedic Surgery

## 2023-01-04 DIAGNOSIS — M25562 Pain in left knee: Secondary | ICD-10-CM

## 2023-01-05 ENCOUNTER — Encounter: Payer: Self-pay | Admitting: Orthopedic Surgery

## 2023-01-05 ENCOUNTER — Telehealth: Payer: Self-pay | Admitting: Orthopedic Surgery

## 2023-01-05 NOTE — Progress Notes (Signed)
Office Visit Note   Patient: Barbara Patton           Date of Birth: February 16, 1941           MRN: [id number] Visit Date: 01/04/2023 Requested by: Renford Dills, MD [address] [place],  [place] [address] PCP: Renford Dills, MD  Subjective: Chief Complaint  Patient presents with   Left Knee - Pain    HPI: Barbara Patton is a 82 y.o. female who presents to the office reporting "my everything hurts".  She has been ambulating with a cane.  She has had left tibial pain for a month around her knee.  She has had a revision total knee replacement done elsewhere on that side.  Does not report any fevers or chills.  She reports some right thigh pain for 3 to 4 months.  No weakness or giving way.  Has some buttock pain along with radicular leg pain.  Had an [facility] 11/15/2022 with Dr. Alvester Morin.  She was in physical therapy which is not helpful.  Takes tramadol from her primary care provider.  Also takes lupus medication.  MRI scan does show some superior endplate fractures around L4-5.  She also is on Zocor..                ROS: All systems reviewed are negative as they relate to the chief complaint within the history of present illness.  Patient denies fevers or chills.  Assessment & Plan: Visit Diagnoses:  1. Left knee pain, unspecified chronicity     Plan: Impression is general body pain.  Left knee radiographs unremarkable.  Dr. Alvester Morin is treating her spine.  I do want to check a vitamin D panel and if her vitamin D is low I think that something she could try to help with some of her joint pains.  She may also want to consider with 2 to 3-week holiday from Zocor.  I will call her with the results of the vitamin D.  Follow-Up Instructions: No follow-ups on file.   Orders:  Orders Placed This Encounter  Procedures   XR KNEE 3 VIEW LEFT   Vitamin D (25 hydroxy)   No orders of the defined types were placed in this encounter.     Procedures: No procedures performed   Clinical  Data: No additional findings.  Objective: Vital Signs: There were no vitals taken for this visit.  Physical Exam:  Constitutional: Patient appears well-developed HEENT:  Head: Normocephalic Eyes:EOM are normal Neck: Normal range of motion Cardiovascular: Normal rate Pulmonary/chest: Effort normal Neurologic: Patient is alert Skin: Skin is warm Psychiatric: Patient has normal mood and affect  Ortho Exam: Ortho exam demonstrates no effusion in either knee.  No warmth in either knee.  Full extension and flexion past 90 just barely in both knees.  No groin pain on the right or left-hand side with internal/external Tatian of the leg.  Mild pain with forward lateral bending.  A little hip flexion weakness on the left compared to the right.  Specialty Comments:  EXAM: MRI LUMBAR SPINE WITHOUT CONTRAST   TECHNIQUE: Multiplanar, multisequence MR imaging of the lumbar spine was performed. No intravenous contrast was administered.   COMPARISON:  Radiographs 10/01/2022 and 07/31/2022.  MRI 12/04/2019.   FINDINGS: Segmentation: Conventional anatomy assumed, with the last open disc space designated L5-S1.Concordant with prior imaging.   Alignment: Grade 1 anterolisthesis at L3-4 and L4-5, similar to previous MRI.   Vertebrae: There are mild superior  endplate compression fractures at the L3 and L4 levels, resulting in approximately 10% loss of vertebral body height. There is associated bone marrow edema, but no osseous retropulsion or definite involvement of the posterior elements. These fractures are not clearly seen on recent or prior radiographs. No other acute osseous findings. The visualized sacroiliac joints appear unremarkable.   Conus medullaris: Extends to the L1-2 level and appears normal.   Paraspinal and other soft tissues: No acute paraspinal findings. The urinary bladder is noted to be distended and moderately trabeculated. Chronic atrophy of the erector spinae  musculature.   Disc levels:   Sagittal images demonstrate no significant disc space findings within the visualized lower thoracic spine. There is mild disc bulging at T11-12 and T12-L1.   L1-2: Preserved disc height. Mild facet hypertrophy. No spinal stenosis or nerve root encroachment.   L2-3: Preserved disc height with mild disc bulging and mild-to-moderate facet hypertrophy. No spinal stenosis or nerve root encroachment.   L3-4: Moderate to severe facet hypertrophy accounts for the grade 1 anterolisthesis and contributes to annular disc bulging and uncovering. There is resulting moderate multifactorial spinal stenosis with mild lateral recess and minimal foraminal narrowing bilaterally. The spinal stenosis has progressed from the previous MRI   L4-5: Moderate to severe facet hypertrophy accounts for the grade 1 anterolisthesis and contributes to annular disc bulging and uncovering. There is only resulting borderline spinal stenosis at this level with minimal left lateral recess and left foraminal narrowing. No nerve root encroachment. No significant changes are seen at this level.   L5-S1: Chronic disc bulging with endplate osteophytes asymmetric to the left. Mild facet hypertrophy. No spinal stenosis or nerve root encroachment.   IMPRESSION: 1. Acute/subacute mild superior endplate compression fractures at L3 and L4 with associated bone marrow edema, but no osseous retropulsion. 2. 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 3. Borderline spinal stenosis at L4-5 secondary to facet hypertrophy and a grade 1 anterolisthesis. 4. Distended urinary bladder with trabeculation, suggesting chronic bladder outlet obstruction.     Electronically Signed   By: Carey Bullocks M.D.   On: 10/26/2022 10:57  Imaging: No results found.   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   Allergic rhinitis 07/26/2015   Anxiety 07/26/2015   Benign essential hypertension 07/26/2015   Dysfunction of left eustachian tube 07/26/2015   Esophageal reflux 07/26/2015   Fibromyalgia 07/26/2015   Pure hypercholesterolemia 07/26/2015   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 05/03/2014   Pes anserinus bursitis of left knee 12/04/2013   Trochanteric bursitis of left hip 08/07/2013   Leg swelling 12/06/2011   Glaucoma 11/02/2011   HLD (hyperlipidemia) 11/02/2011   HTN (hypertension) 11/02/2011   Hypothyroid 11/02/2011   OA (osteoarthritis) 11/02/2011   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) 03/16/2011   Presence of artificial knee joint 03/16/2011   Past Medical History:  Diagnosis Date   Glaucoma    High cholesterol  Hypertension    Lupus (HCC)    Thyroid disease     Family History  Problem Relation Age of Onset   Cancer Father    Cancer Brother    Cancer Brother    Stroke Brother     Past Surgical History:  Procedure Laterality Date   ABDOMINAL HYSTERECTOMY     CATARACT EXTRACTION, BILATERAL     TOTAL KNEE ARTHROPLASTY Bilateral    TUBAL LIGATION     Social History   Occupational History   Not on file  Tobacco Use   Smoking status: Former    Current packs/day: 0.00    Types: Cigarettes    Quit date: 1997    Years since quitting: 27.6   Smokeless tobacco: Never  Vaping Use   Vaping status: Never Used  Substance and Sexual Activity   Alcohol use:  Yes    Alcohol/week: 3.0 standard drinks of alcohol    Types: 3 Glasses of wine per week    Comment: occ wine   Drug use: No   Sexual activity: Not on file

## 2023-01-05 NOTE — Telephone Encounter (Signed)
Pt came in for an appt yesterday and stated her spine was hurting and would like to know should she go to [facility]?, patient would also like to know what is she supposed to do about Vitamin D and would like to know if we contacted her Dr about stopping Simvastatin? Please advise

## 2023-01-08 ENCOUNTER — Telehealth: Payer: Self-pay | Admitting: Physical Medicine and Rehabilitation

## 2023-01-08 NOTE — Telephone Encounter (Signed)
Patient request another appointment with Dr Alvester Morin, states she's have severe back and leg pain

## 2023-01-08 NOTE — Telephone Encounter (Signed)
I think it is fine for her to go to Dr. Alvester Morin to see what he thinks about more injections in the back  Regarding vitamin D hers was 50 which is low and so I think supplementing with 5000 units a day for 3 weeks is indicated.  Can you call that in thanks  As far as stopping simvastatin that is really between her and her physician but it was only an off the cuff suggestion on my part.

## 2023-01-08 NOTE — Progress Notes (Signed)
Please: 5000 units of vitamin D3 by mouth daily for 3 weeks and then weekly for 16 weeks.  Thanks

## 2023-01-09 ENCOUNTER — Encounter (HOSPITAL_BASED_OUTPATIENT_CLINIC_OR_DEPARTMENT_OTHER): Payer: Self-pay

## 2023-01-09 NOTE — Progress Notes (Signed)
Send patient portal message advising pt. Unable to reach her on the phone

## 2023-01-09 NOTE — Telephone Encounter (Signed)
Sent pt. Portal message about vitamin D. Was unable to reach her to discuss this. Holding about appt with Dr. August Saucer

## 2023-01-09 NOTE — Telephone Encounter (Signed)
LVM for pt to cb to advise, if you reach pt to schedule appt with Dr. Alvester Morin can you please advise about the Vitamin D and simvastatin?

## 2023-01-10 ENCOUNTER — Other Ambulatory Visit: Payer: Self-pay

## 2023-01-10 DIAGNOSIS — M797 Fibromyalgia: Secondary | ICD-10-CM | POA: Diagnosis not present

## 2023-01-10 DIAGNOSIS — J029 Acute pharyngitis, unspecified: Secondary | ICD-10-CM | POA: Diagnosis not present

## 2023-01-10 DIAGNOSIS — R21 Rash and other nonspecific skin eruption: Secondary | ICD-10-CM | POA: Diagnosis not present

## 2023-01-10 MED ORDER — ERGOCALCIFEROL 62.5 MCG (2500 UT) PO CAPS: ORAL_CAPSULE | ORAL | 0 refills | Status: AC

## 2023-01-12 NOTE — Telephone Encounter (Signed)
I tried to call patient, no answer.  

## 2023-01-12 NOTE — Telephone Encounter (Signed)
I think she should go with taking the vitamin D which may help some.  She may also want to consider getting a lumbosacral corset with rigid stays to help her back until these subacute compression fractures heal more fully.  Outside of that nothing else really to do.  Thanks at least until [facility] decides to inject her again in September

## 2023-01-17 ENCOUNTER — Telehealth: Payer: Self-pay | Admitting: Physical Medicine and Rehabilitation

## 2023-01-17 NOTE — Telephone Encounter (Signed)
Spoke with patient and informed her that she can come in for her pain. She stated the last injection did not really help. OV scheduled 01/18/23

## 2023-01-17 NOTE — Telephone Encounter (Signed)
Pt called in stating her right thigh has been giving her a fit for the past week and wants to know if Alvester Morin can do anything

## 2023-01-18 ENCOUNTER — Encounter: Payer: Self-pay | Admitting: Physical Medicine and Rehabilitation

## 2023-01-18 ENCOUNTER — Ambulatory Visit: Payer: Medicare PPO | Admitting: Physical Medicine and Rehabilitation

## 2023-01-18 DIAGNOSIS — M48062 Spinal stenosis, lumbar region with neurogenic claudication: Secondary | ICD-10-CM

## 2023-01-18 DIAGNOSIS — M7062 Trochanteric bursitis, left hip: Secondary | ICD-10-CM

## 2023-01-18 DIAGNOSIS — M5416 Radiculopathy, lumbar region: Secondary | ICD-10-CM | POA: Diagnosis not present

## 2023-01-18 DIAGNOSIS — M7061 Trochanteric bursitis, right hip: Secondary | ICD-10-CM

## 2023-01-18 NOTE — Progress Notes (Unsigned)
Barbara Patton - 82 y.o. female MRN [id number]  Date of birth: December 05, 1940  Office Visit Note: Visit Date: 01/18/2023 PCP: Renford Dills, MD Referred by: Renford Dills, MD  Subjective: Chief Complaint  Patient presents with  . Right Thigh - Pain   HPI: Barbara Patton is a 82 y.o. female who comes in today for evaluation of chronic, worsening and severe bilateral hip/thigh pain. Patient is well known to [place]. Pain ongoing for several years, worsens with activity and movement. She describes pain as sore and aching sensation, currently rates as 8 out of 10. She is managed by Dr. Dorene Grebe from orthopedic standpoint. Patient underwent bilateral greater trochanter injection with my partner Karenann Cai, [place] in March of this year, she reports significant relief of pain with this procedure. She has undergone multiple lumbar spine injections in our office, most recent was bilateral L3 transforaminal epidural steroid injection on 11/15/2022, minimal relief of pain with this procedure. Lumbar MRI imaging from May of this year exhibits moderate multifactorial spinal stenosis at L3-L4, progressed from previous MRI in 2021. Patient denies focal weakness, numbness and tingling. No recent trauma or falls.    Review of Systems  Musculoskeletal:  Positive for myalgias.  Neurological:  Negative for tingling, sensory change, focal weakness and weakness.  All other systems reviewed and are negative.  Otherwise per HPI.  Assessment & Plan: Visit Diagnoses:    ICD-10-CM   1. Greater trochanteric bursitis, right  M70.61 Large Joint Inj: bilateral greater trochanter    2. Greater trochanteric bursitis, left  M70.62 Large Joint Inj: bilateral greater trochanter    3. Lumbar radiculopathy  M54.16     4. Spinal stenosis of lumbar region with neurogenic claudication  M48.062        Plan: Findings:  1. Chronic, worsening and severe bilateral hip/thigh pain. Patient continues to have severe pain despite good  conservative therapies such as home exercise regimen, rest and use of medications. Patients clinical presentation and exam are consistent with greater trochanteric bursitis. I performed bilateral greater trochanter injections in the office today, she tolerated without difficulty. I instructed patient to follow up with Dr. August Saucer for further orthopedic complaints.  2. She continues to experience chronic lower back pain radiating into her legs, minimal relief of pain with previous epidural steroid injection in June. Best to start looking at other options outside of injection therapy such as PT/chiropractic and chronic pain management. There is moderate spinal canal stenosis at L3-L4, however patient is adamant she is not undergoing surgery. No red flag symptoms noted upon exam today.     Meds & Orders: No orders of the defined types were placed in this encounter.   Orders Placed This Encounter  Procedures  . Large Joint Inj: bilateral greater trochanter    Follow-up: Return if symptoms worsen or fail to improve.   Procedures: Large Joint Inj: bilateral greater trochanter on 01/18/2023 2:35 PM Indications: pain and diagnostic evaluation Details: 22 G 3.5 in needle, lateral approach Medications (Right): 4 mL lidocaine 1 %; 40 mg methylPREDNISolone acetate 80 MG/ML Medications (Left): 4 mL lidocaine 1 %; 40 mg methylPREDNISolone acetate 80 MG/ML Outcome: tolerated well, no immediate complications  Patient tolerated bilateral greater trochanter injection without difficulty.  Consent was given by the patient. Immediately prior to procedure a time out was called to verify the correct patient, procedure, equipment, support staff and site/side marked as required. Patient was prepped and draped in the usual sterile fashion.  Clinical History: EXAM: MRI LUMBAR SPINE WITHOUT CONTRAST   TECHNIQUE: Multiplanar, multisequence MR imaging of the lumbar spine was performed. No intravenous contrast  was administered.   COMPARISON:  Radiographs 10/01/2022 and 07/31/2022.  MRI 12/04/2019.   FINDINGS: Segmentation: Conventional anatomy assumed, with the last open disc space designated L5-S1.Concordant with prior imaging.   Alignment: Grade 1 anterolisthesis at L3-4 and L4-5, similar to previous MRI.   Vertebrae: There are mild superior endplate compression fractures at the L3 and L4 levels, resulting in approximately 10% loss of vertebral body height. There is associated bone marrow edema, but no osseous retropulsion or definite involvement of the posterior elements. These fractures are not clearly seen on recent or prior radiographs. No other acute osseous findings. The visualized sacroiliac joints appear unremarkable.   Conus medullaris: Extends to the L1-2 level and appears normal.   Paraspinal and other soft tissues: No acute paraspinal findings. The urinary bladder is noted to be distended and moderately trabeculated. 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   Disc levels:   Sagittal images demonstrate no significant disc space findings within the visualized lower thoracic spine. There is mild disc bulging at T11-12 and T12-L1.   L1-2: Preserved disc height. Mild facet hypertrophy. No spinal stenosis or nerve root encroachment.   L2-3: Preserved disc height with mild disc bulging and mild-to-moderate facet hypertrophy. No spinal stenosis or nerve root encroachment.   L3-4: Moderate to severe facet hypertrophy accounts for the grade 1 anterolisthesis and contributes to annular disc bulging and uncovering. There is resulting moderate multifactorial spinal stenosis with mild lateral recess and minimal foraminal narrowing bilaterally. The spinal stenosis has progressed from the previous MRI   L4-5: Moderate to severe facet hypertrophy accounts for the grade 1 anterolisthesis and contributes to annular disc bulging and uncovering. There is only resulting  borderline spinal stenosis at this level with minimal left lateral recess and left foraminal narrowing. No nerve root encroachment. No significant changes are seen at this level.   L5-S1: Chronic disc bulging with endplate osteophytes asymmetric to the left. Mild facet hypertrophy. No spinal stenosis or nerve root encroachment.   IMPRESSION: 1. Acute/subacute mild superior endplate compression fractures at L3 and L4 with associated bone marrow edema, but no osseous retropulsion. 2. 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 3. Borderline spinal stenosis at L4-5 secondary to facet hypertrophy and a grade 1 anterolisthesis. 4. Distended urinary bladder with trabeculation, suggesting chronic bladder outlet obstruction.     Electronically Signed   By: Carey Bullocks M.D.   On: 10/26/2022 10:57   She reports that she quit smoking about 27 years ago. Her smoking use included cigarettes. She has never used smokeless tobacco. No results for input(s): "HGBA1C", "LABURIC" in the last 8760 hours.  Objective:  VS:  HT:    WT:   BMI:     BP:   HR: bpm  TEMP: ( )  RESP:  Physical Exam Vitals and nursing note reviewed.  HENT:     Head: Normocephalic and atraumatic.     Right Ear: External ear normal.     Left Ear: External ear normal.     Nose: Nose normal.     Mouth/Throat:     Mouth: Mucous membranes are moist.  Eyes:     Extraocular Movements: Extraocular movements intact.  Cardiovascular:     Rate and Rhythm: Normal rate.     Pulses: Normal pulses.  Pulmonary:     Effort:  Pulmonary effort is normal.  Abdominal:     General: Abdomen is flat. There is no distension.  Musculoskeletal:        General: Tenderness present.     Cervical back: Normal range of motion.     Comments: Patient rises from seated position to standing without difficulty. Good lumbar range of motion. No pain noted with facet loading. 5/5 strength noted  with bilateral hip flexion, knee flexion/extension, ankle dorsiflexion/plantarflexion and EHL. No clonus noted bilaterally. Pain upon palpation of bilateral greater trochanters. No pain with internal/external rotation of bilateral hips. Sensation intact bilaterally. Negative slump test bilaterally. Ambulates with cane, gait slow and unsteady.     Skin:    General: Skin is warm and dry.     Capillary Refill: Capillary refill takes less than 2 seconds.  Neurological:     Mental Status: She is alert and oriented to person, place, and time.     Gait: Gait abnormal.  Psychiatric:        Mood and Affect: Mood normal.        Behavior: Behavior normal.    Ortho Exam  Imaging: No results found.  Past Medical/Family/Surgical/Social History: Medications & Allergies reviewed per EMR, new medications updated. Patient Active Problem List   Diagnosis Date Noted  . Anemia 10/20/2021  . Backache 10/20/2021  . Bladder outlet obstruction 10/20/2021  . Constipation 10/20/2021  . 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 Menopausal symptom 10/20/2021  . Overweight 10/20/2021  . Personal history of colonic polyps 10/20/2021  . Urinary incontinence 10/20/2021  . Globus pharyngeus 05/23/2021  . 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 Referred otalgia of left ear 08/23/2015  . 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 Allergic rhinitis 07/26/2015  . Anxiety 07/26/2015  . Benign essential hypertension 07/26/2015  . Dysfunction of left eustachian tube 07/26/2015  . Esophageal reflux 07/26/2015  . Fibromyalgia 07/26/2015  . Pure hypercholesterolemia 07/26/2015  . 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 Pes anserinus bursitis of left knee 12/04/2013  . Trochanteric bursitis of left hip 08/07/2013  . Leg swelling 12/06/2011  . Glaucoma 11/02/2011  . HLD (hyperlipidemia) 11/02/2011  . HTN (hypertension)  11/02/2011  . Hypothyroid 11/02/2011  . OA (osteoarthritis) 11/02/2011  . 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 Presence of artificial knee joint 03/16/2011   Past Medical History:  Diagnosis Date  . Glaucoma   . High cholesterol   . Hypertension   . Lupus (HCC)   . Thyroid disease    Family History  Problem Relation Age of Onset  . Cancer Father   . Cancer Brother   . Cancer Brother   . Stroke Brother    Past Surgical History:  Procedure Laterality Date  . ABDOMINAL HYSTERECTOMY    . CATARACT EXTRACTION, BILATERAL    . TOTAL KNEE ARTHROPLASTY Bilateral   . TUBAL LIGATION     Social History   Occupational History  . Not on file  Tobacco Use  . Smoking status: Former    Current packs/day: 0.00    Types: Cigarettes    Quit date: 1997    Years since quitting: 27.6  . Smokeless tobacco: Never  Vaping Use  . Vaping status: Never Used  Substance and Sexual Activity  . Alcohol use: Yes    Alcohol/week: 3.0 standard drinks of alcohol    Types: 3 Glasses of wine per week  Comment: occ wine  . Drug use: No  . Sexual activity: Not on file

## 2023-01-18 NOTE — Progress Notes (Signed)
Functional Pain Scale - descriptive words and definitions  Unmanageable (7)  Pain interferes with normal ADL's/nothing seems to help/sleep is very difficult/active distractions are very difficult to concentrate on. Severe range order  Average Pain 9-10   Right thigh pain

## 2023-01-19 ENCOUNTER — Telehealth: Payer: Self-pay | Admitting: Physical Medicine and Rehabilitation

## 2023-01-19 NOTE — Telephone Encounter (Signed)
Pt called in stating the shot she got for her right thigh did not work and would like to know how to follow up

## 2023-01-22 NOTE — Telephone Encounter (Signed)
Spoke with patient and explained the injection may take 3-4 days to work. Per Dr. Alvester Morin, spine injections have not helped and the bursa and troch injections have not either. Patient to follow up with Dr. August Saucer or Franky Macho

## 2023-01-24 MED ORDER — METHYLPREDNISOLONE ACETATE 80 MG/ML IJ SUSP
40.0000 mg | INTRAMUSCULAR | Status: AC | PRN
Start: 2023-01-18 — End: 2023-01-18
  Administered 2023-01-18: 40 mg via INTRA_ARTICULAR

## 2023-01-24 MED ORDER — LIDOCAINE HCL 1 % IJ SOLN
4.0000 mL | INTRAMUSCULAR | Status: AC | PRN
Start: 2023-01-18 — End: 2023-01-18
  Administered 2023-01-18: 4 mL

## 2023-02-01 ENCOUNTER — Ambulatory Visit (INDEPENDENT_AMBULATORY_CARE_PROVIDER_SITE_OTHER): Payer: Medicare PPO | Admitting: Orthopedic Surgery

## 2023-02-01 DIAGNOSIS — M5416 Radiculopathy, lumbar region: Secondary | ICD-10-CM

## 2023-02-01 DIAGNOSIS — M7061 Trochanteric bursitis, right hip: Secondary | ICD-10-CM | POA: Diagnosis not present

## 2023-02-01 DIAGNOSIS — M7062 Trochanteric bursitis, left hip: Secondary | ICD-10-CM | POA: Diagnosis not present

## 2023-02-03 ENCOUNTER — Encounter: Payer: Self-pay | Admitting: Orthopedic Surgery

## 2023-02-03 NOTE — Progress Notes (Signed)
Office Visit Note   Patient: Barbara Patton           Date of Birth: [birth year]           MRN: [id number] Visit Date: 02/01/2023 Requested by: Renford Dills, MD [address] [place],  [place] [address] PCP: Renford Dills, MD  Subjective: Chief Complaint  Patient presents with   Left Hip - Pain    HPI: ROSSI BEECHLER is a 82 y.o. female who presents to the office reporting bilateral thigh and hip pain.  She has had trochanteric injections within the past several months.  Work on the left side but not on the right-hand side.  States the pain is more on the right-hand side than the left now.  Describes some thigh pain.  Back shots with epidural steroid injections in the lumbar spine have not been helpful.  Had the injections done upstairs.  Pain does radiate the down the right leg but not below the knee.  Hurts her to bend..                ROS: All systems reviewed are negative as they relate to the chief complaint within the history of present illness.  Patient denies fevers or chills.  Assessment & Plan: Visit Diagnoses:  1. Greater trochanteric bursitis, right   2. Greater trochanteric bursitis, left   3. Lumbar radiculopathy     Plan: Impression is possible trochanteric bursitis on the right-hand side.  May be worth trying an ultrasound-guided trochanteric injection since they have helped her in the past.  We will see her back for that in 4 weeks.  Nothing really to do regarding the back at this time.  She is adamant about avoiding surgery.  Unfortunately is not really in a position where she can bowl at this time.  Follow-Up Instructions: No follow-ups on file.   Orders:  No orders of the defined types were placed in this encounter.  No orders of the defined types were placed in this encounter.     Procedures: No procedures performed   Clinical Data: No additional findings.  Objective: Vital Signs: There were no vitals taken for this  visit.  Physical Exam:  Constitutional: Patient appears well-developed HEENT:  Head: Normocephalic Eyes:EOM are normal Neck: Normal range of motion Cardiovascular: Normal rate Pulmonary/chest: Effort normal Neurologic: Patient is alert Skin: Skin is warm Psychiatric: Patient has normal mood and affect  Ortho Exam: Ortho exam demonstrates trochanteric tenderness more on the right than the left.  Not too much groin pain restricted range of motion with internal extra rotation of each leg.  No nerve root tension signs or paresthesias L1-S1 bilateral lower extremities.  Pedal pulses palpable.  Specialty Comments:  EXAM: MRI LUMBAR SPINE WITHOUT CONTRAST   TECHNIQUE: Multiplanar, multisequence MR imaging of the lumbar spine was performed. No intravenous contrast was administered.   COMPARISON:  Radiographs 10/01/2022 and 07/31/2022.  MRI 12/04/2019.   FINDINGS: Segmentation: Conventional anatomy assumed, with the last open disc space designated L5-S1.Concordant with prior imaging.   Alignment: Grade 1 anterolisthesis at L3-4 and L4-5, similar to previous MRI.   Vertebrae: There are mild superior endplate compression fractures at the L3 and L4 levels, resulting in approximately 10% loss of vertebral body height. There is associated bone marrow edema, but no osseous retropulsion or definite involvement of the posterior elements. These fractures are not clearly seen on recent or prior radiographs. No other acute osseous findings. The visualized sacroiliac joints  appear unremarkable.   Conus medullaris: Extends to the L1-2 level and appears normal.   Paraspinal and other soft tissues: No acute paraspinal findings. The urinary bladder is noted to be distended and moderately trabeculated. 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   Disc levels:   Sagittal images demonstrate no significant disc space findings within the visualized lower thoracic spine. There is mild  disc bulging at T11-12 and T12-L1.   L1-2: Preserved disc height. Mild facet hypertrophy. No spinal stenosis or nerve root encroachment.   L2-3: Preserved disc height with mild disc bulging and mild-to-moderate facet hypertrophy. No spinal stenosis or nerve root encroachment.   L3-4: Moderate to severe facet hypertrophy accounts for the grade 1 anterolisthesis and contributes to annular disc bulging and uncovering. There is resulting moderate multifactorial spinal stenosis with mild lateral recess and minimal foraminal narrowing bilaterally. The spinal stenosis has progressed from the previous MRI   L4-5: Moderate to severe facet hypertrophy accounts for the grade 1 anterolisthesis and contributes to annular disc bulging and uncovering. There is only resulting borderline spinal stenosis at this level with minimal left lateral recess and left foraminal narrowing. No nerve root encroachment. No significant changes are seen at this level.   L5-S1: Chronic disc bulging with endplate osteophytes asymmetric to the left. Mild facet hypertrophy. No spinal stenosis or nerve root encroachment.   IMPRESSION: 1. Acute/subacute mild superior endplate compression fractures at L3 and L4 with associated bone marrow edema, but no osseous retropulsion. 2. 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 3. Borderline spinal stenosis at L4-5 secondary to facet hypertrophy and a grade 1 anterolisthesis. 4. Distended urinary bladder with trabeculation, suggesting chronic bladder outlet obstruction.     Electronically Signed   By: Carey Bullocks M.D.   On: 10/26/2022 10:57  Imaging: No results found.   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   Allergic rhinitis 07/26/2015   Anxiety 07/26/2015   Benign essential hypertension 07/26/2015   Dysfunction of left eustachian tube 07/26/2015   Esophageal reflux 07/26/2015   Fibromyalgia 07/26/2015   Pure hypercholesterolemia 07/26/2015   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 05/03/2014   Pes anserinus bursitis of left knee 12/04/2013   Trochanteric bursitis of left hip 08/07/2013   Leg swelling 12/06/2011   Glaucoma 11/02/2011   HLD (hyperlipidemia) 11/02/2011   HTN (hypertension) 11/02/2011   Hypothyroid 11/02/2011   OA (osteoarthritis) 11/02/2011   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) 03/16/2011   Presence of artificial knee joint 03/16/2011   Past Medical History:  Diagnosis Date   Glaucoma    High cholesterol    Hypertension    Lupus (HCC)    Thyroid disease     Family History  Problem Relation Age of Onset   Cancer Father    Cancer Brother    Cancer Brother    Stroke Brother     Past Surgical History:  Procedure Laterality Date  ABDOMINAL HYSTERECTOMY     CATARACT EXTRACTION, BILATERAL     TOTAL KNEE ARTHROPLASTY Bilateral    TUBAL LIGATION     Social History   Occupational History   Not on file  Tobacco Use   Smoking status: Former    Current packs/day: 0.00    Types: Cigarettes    Quit date: 1997    Years since quitting: 27.6   Smokeless tobacco: Never  Vaping Use   Vaping status: Never Used  Substance and Sexual Activity   Alcohol use: Yes    Alcohol/week: 3.0 standard drinks of alcohol    Types: 3 Glasses of wine per week    Comment: occ wine   Drug use: No   Sexual activity: Not on  file

## 2023-03-02 ENCOUNTER — Ambulatory Visit: Payer: Medicare PPO | Admitting: Orthopedic Surgery

## 2023-03-12 ENCOUNTER — Other Ambulatory Visit: Payer: Self-pay

## 2023-03-12 ENCOUNTER — Ambulatory Visit (INDEPENDENT_AMBULATORY_CARE_PROVIDER_SITE_OTHER): Payer: Medicare PPO | Admitting: Surgical

## 2023-03-12 DIAGNOSIS — M7062 Trochanteric bursitis, left hip: Secondary | ICD-10-CM | POA: Diagnosis not present

## 2023-03-12 DIAGNOSIS — M7061 Trochanteric bursitis, right hip: Secondary | ICD-10-CM | POA: Diagnosis not present

## 2023-03-13 ENCOUNTER — Telehealth: Payer: Self-pay

## 2023-03-13 ENCOUNTER — Encounter: Payer: Self-pay | Admitting: Surgical

## 2023-03-13 MED ORDER — METHYLPREDNISOLONE ACETATE 40 MG/ML IJ SUSP
40.0000 mg | INTRAMUSCULAR | Status: AC | PRN
Start: 2023-03-12 — End: 2023-03-12
  Administered 2023-03-12: 40 mg via INTRA_ARTICULAR

## 2023-03-13 MED ORDER — LIDOCAINE HCL 1 % IJ SOLN
5.0000 mL | INTRAMUSCULAR | Status: AC | PRN
Start: 2023-03-12 — End: 2023-03-12
  Administered 2023-03-12: 5 mL

## 2023-03-13 MED ORDER — BUPIVACAINE HCL 0.25 % IJ SOLN
4.0000 mL | INTRAMUSCULAR | Status: AC | PRN
Start: 2023-03-12 — End: 2023-03-12
  Administered 2023-03-12: 4 mL via INTRA_ARTICULAR

## 2023-03-13 NOTE — Telephone Encounter (Signed)
Need to call patient to make sure she is good coming here for PT

## 2023-03-13 NOTE — Telephone Encounter (Signed)
-----   Message from [facility] sent at 03/13/2023 12:26 PM EDT ----- [name], can you set Barbara Patton up for physical therapy somewhere of her choosing?  She really just needs to go 1 time a week for like 4 weeks to work on IT band stretching exercises to hopefully prevent this trochanteric bursitis pain from coming back.  Thank you

## 2023-03-13 NOTE — Progress Notes (Signed)
Follow-up Office Visit Note   Patient: Barbara Patton           Date of Birth: June 18, 1940           MRN: [id number] Visit Date: 03/12/2023 Requested by: Renford Dills, MD [address] [place],  [place] [address] PCP: Renford Dills, MD  Subjective: Chief Complaint  Patient presents with   Right Hip - Pain    HPI: Barbara Patton is a 82 y.o. female who returns to the office for follow-up visit.    Plan at last visit was: Impression is possible trochanteric bursitis on the right-hand side. May be worth trying an ultrasound-guided trochanteric injection since they have helped her in the past. We will see her back for that in 4 weeks. Nothing really to do regarding the back at this time. She is adamant about avoiding surgery. Unfortunately is not really in a position where she can bowl at this time.   Since then, patient notes she has continued pain that she localizes to the lateral aspect of the right hip primarily.  Also has lateral sided left hip pain.  Has had previous good relief with trochanteric injections under ultrasound guidance.  She would like to repeat these today.  She also describes right wrist pain that she localizes to the base of the thumb on the radial side of the wrist.  She states that this has been painful for months without any history of injury.  She uses CBD oil which helps.  No numbness or tingling, injury, burning sensation.  No radiation of pain.  She also did note that she discontinued her simvastatin to see if this will help with her muscle pain and it has not provided any relief.              ROS: All systems reviewed are negative as they relate to the chief complaint within the history of present illness.  Patient denies fevers or chills.  Assessment & Plan: Visit Diagnoses:  1. Greater trochanteric bursitis, right     Plan: Barbara Patton is a 82 y.o. female who returns to the office for follow-up visit.  Plan from last visit was noted  above in HPI.  They now return with continued pain in the trochanteric region.  Discussed injection with Dr. August Saucer at her last visit.  He recommended waiting until a month later and so she is here today to try trochanteric injection.  Bilateral trochanteric injections were administered under ultrasound guidance and patient tolerated procedure well.  There was no resistance during administration of the injection.  Plan is to get her set up with physical therapy for IT band stretching exercises in order to hopefully help prevent this pain from coming back in the future as it has multiple times.  Would not want to do any trochanteric injections for at least 4 months.  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  This is likely responsible for the majority of her pain that she is localizing to the base of the thumb.  After discussion of options, she does not want to do anything aside from just continue to use her CBD oil and see if this will help resolve this pain.  Follow-up with the office as needed.  Follow-Up Instructions: No follow-ups on file.   Orders:  Orders Placed This Encounter  Procedures   US Guided Needle Placement - No Linked  Charges   No orders of the defined types were placed in this encounter.     Procedures: Large Joint Inj: bilateral greater trochanter on 03/12/2023 12:24 PM Indications: pain and diagnostic evaluation Details: 22 G 3.5 in needle, ultrasound-guided lateral approach  Arthrogram: No  Medications (Right): 5 mL lidocaine 1 %; 4 mL bupivacaine 0.25 %; 40 mg methylPREDNISolone acetate 40 MG/ML Medications (Left): 5 mL lidocaine 1 %; 4 mL bupivacaine 0.25 %; 40 mg methylPREDNISolone acetate 40 MG/ML Outcome: tolerated well, no immediate complications Procedure, treatment alternatives, risks and benefits explained, specific risks discussed. Consent was given by the patient.  Immediately prior to procedure a time out was called to verify the correct patient, procedure, equipment, support staff and site/side marked as required. Patient was prepped and draped in the usual sterile fashion.       Clinical Data: No additional findings.  Objective: Vital Signs: There were no vitals taken for this visit.  Physical Exam:  Constitutional: Patient appears well-developed HEENT:  Head: Normocephalic Eyes:EOM are normal Neck: Normal range of motion Cardiovascular: Normal rate Pulmonary/chest: Effort normal Neurologic: Patient is alert Skin: Skin is warm Psychiatric: Patient has normal mood and affect  Ortho Exam: Ortho exam demonstrates bilateral hips without pain with hip range of motion bilaterally.  She does have ambulation without Trendelenburg gait.  She has tenderness bilaterally over the greater trochanter rated moderately with more tenderness over the right rather than the left.  No calf tenderness.  Negative Homans' sign.  Able to perform straight leg raise bilaterally.  Right wrist with palpable radial pulse rated 2+.  Intact EPL, FPL, finger abduction.  She does have pain with thumb circumduction and tenderness over the first [facility] as well as the radiocarpal joint and the anatomic snuffbox and 3-4 portal.  There is no tenderness over the ulnar fovea.  There is no cellulitis or skin changes or any significant swelling noted.  Specialty Comments:  EXAM: MRI LUMBAR SPINE WITHOUT CONTRAST   TECHNIQUE: Multiplanar, multisequence MR imaging of the lumbar spine was performed. No intravenous contrast was administered.   COMPARISON:  Radiographs 10/01/2022 and 07/31/2022.  MRI 12/04/2019.   FINDINGS: Segmentation: Conventional anatomy assumed, with the last open disc space designated L5-S1.Concordant with prior imaging.   Alignment: Grade 1 anterolisthesis at L3-4 and L4-5, similar to previous MRI.   Vertebrae: There are mild superior endplate compression  fractures at the L3 and L4 levels, resulting in approximately 10% loss of vertebral body height. There is associated bone marrow edema, but no osseous retropulsion or definite involvement of the posterior elements. These fractures are not clearly seen on recent or prior radiographs. No other acute osseous findings. The visualized sacroiliac joints appear unremarkable.   Conus medullaris: Extends to the L1-2 level and appears normal.   Paraspinal and other soft tissues: No acute paraspinal findings. The urinary bladder is noted to be distended and moderately trabeculated. 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   Disc levels:   Sagittal images demonstrate no significant disc space findings within the visualized lower thoracic spine. There is mild disc bulging at T11-12 and T12-L1.   L1-2: Preserved disc height. Mild facet hypertrophy. No spinal stenosis or nerve root encroachment.   L2-3: Preserved disc height with mild disc bulging and mild-to-moderate facet hypertrophy. No spinal stenosis or nerve root encroachment.   L3-4: Moderate to severe facet hypertrophy accounts for the grade 1 anterolisthesis and contributes to annular disc bulging and uncovering. There is resulting moderate multifactorial spinal stenosis  with mild lateral recess and minimal foraminal narrowing bilaterally. The spinal stenosis has progressed from the previous MRI   L4-5: Moderate to severe facet hypertrophy accounts for the grade 1 anterolisthesis and contributes to annular disc bulging and uncovering. There is only resulting borderline spinal stenosis at this level with minimal left lateral recess and left foraminal narrowing. No nerve root encroachment. No significant changes are seen at this level.   L5-S1: Chronic disc bulging with endplate osteophytes asymmetric to the left. Mild facet hypertrophy. No spinal stenosis or nerve root encroachment.   IMPRESSION: 1. Acute/subacute  mild superior endplate compression fractures at L3 and L4 with associated bone marrow edema, but no osseous retropulsion. 2. 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 3. Borderline spinal stenosis at L4-5 secondary to facet hypertrophy and a grade 1 anterolisthesis. 4. Distended urinary bladder with trabeculation, suggesting chronic bladder outlet obstruction.     Electronically Signed   By: Carey Bullocks M.D.   On: 10/26/2022 10:57  Imaging: No results found.   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   Allergic rhinitis 07/26/2015   Anxiety 07/26/2015   Benign essential hypertension 07/26/2015   Dysfunction of left eustachian tube 07/26/2015   Esophageal reflux 07/26/2015   Fibromyalgia 07/26/2015   Pure hypercholesterolemia 07/26/2015   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 05/03/2014   Pes anserinus bursitis of left knee 12/04/2013   Trochanteric bursitis of left hip 08/07/2013   Leg swelling 12/06/2011   Glaucoma 11/02/2011   HLD (hyperlipidemia) 11/02/2011   HTN (hypertension) 11/02/2011   Hypothyroid 11/02/2011   OA (osteoarthritis) 11/02/2011   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) 03/16/2011   Presence of artificial knee joint 03/16/2011   Past Medical History:  Diagnosis Date   Glaucoma    High cholesterol    Hypertension    Lupus (HCC)    Thyroid disease      Family History  Problem Relation Age of Onset   Cancer Father    Cancer Brother    Cancer Brother    Stroke Brother     Past Surgical History:  Procedure Laterality Date   ABDOMINAL HYSTERECTOMY     CATARACT EXTRACTION, BILATERAL     TOTAL KNEE ARTHROPLASTY Bilateral    TUBAL LIGATION     Social History   Occupational History   Not on file  Tobacco Use   Smoking status: Former    Current packs/day: 0.00    Types: Cigarettes    Quit date: 1997    Years since quitting: 27.7   Smokeless tobacco: Never  Vaping Use   Vaping status: Never Used  Substance and Sexual Activity   Alcohol use: Yes    Alcohol/week: 3.0 standard drinks of alcohol    Types: 3 Glasses of wine per week    Comment: occ wine   Drug use: No   Sexual activity: Not on file

## 2023-03-14 ENCOUNTER — Other Ambulatory Visit: Payer: Self-pay

## 2023-03-14 DIAGNOSIS — Z6830 Body mass index (BMI) 30.0-30.9, adult: Secondary | ICD-10-CM | POA: Diagnosis not present

## 2023-03-14 DIAGNOSIS — R21 Rash and other nonspecific skin eruption: Secondary | ICD-10-CM | POA: Diagnosis not present

## 2023-03-14 DIAGNOSIS — M7061 Trochanteric bursitis, right hip: Secondary | ICD-10-CM

## 2023-03-14 DIAGNOSIS — M7062 Trochanteric bursitis, left hip: Secondary | ICD-10-CM

## 2023-03-14 DIAGNOSIS — R5383 Other fatigue: Secondary | ICD-10-CM | POA: Diagnosis not present

## 2023-03-14 DIAGNOSIS — E559 Vitamin D deficiency, unspecified: Secondary | ICD-10-CM | POA: Diagnosis not present

## 2023-03-14 DIAGNOSIS — M1991 Primary osteoarthritis, unspecified site: Secondary | ICD-10-CM | POA: Diagnosis not present

## 2023-03-14 DIAGNOSIS — H2011 Chronic iridocyclitis, right eye: Secondary | ICD-10-CM | POA: Diagnosis not present

## 2023-03-14 DIAGNOSIS — R220 Localized swelling, mass and lump, head: Secondary | ICD-10-CM | POA: Diagnosis not present

## 2023-03-14 DIAGNOSIS — M329 Systemic lupus erythematosus, unspecified: Secondary | ICD-10-CM | POA: Diagnosis not present

## 2023-03-14 DIAGNOSIS — M797 Fibromyalgia: Secondary | ICD-10-CM | POA: Diagnosis not present

## 2023-03-14 NOTE — Telephone Encounter (Signed)
Referral placed.

## 2023-03-20 ENCOUNTER — Other Ambulatory Visit: Payer: Self-pay | Admitting: Neurology

## 2023-03-20 DIAGNOSIS — R251 Tremor, unspecified: Secondary | ICD-10-CM

## 2023-03-23 DIAGNOSIS — Z23 Encounter for immunization: Secondary | ICD-10-CM | POA: Diagnosis not present

## 2023-03-26 ENCOUNTER — Encounter: Payer: Self-pay | Admitting: Physical Therapy

## 2023-03-26 ENCOUNTER — Other Ambulatory Visit: Payer: Self-pay

## 2023-03-26 ENCOUNTER — Ambulatory Visit (INDEPENDENT_AMBULATORY_CARE_PROVIDER_SITE_OTHER): Payer: Medicare PPO | Admitting: Physical Therapy

## 2023-03-26 DIAGNOSIS — M6281 Muscle weakness (generalized): Secondary | ICD-10-CM

## 2023-03-26 DIAGNOSIS — M79604 Pain in right leg: Secondary | ICD-10-CM

## 2023-03-26 DIAGNOSIS — R2681 Unsteadiness on feet: Secondary | ICD-10-CM

## 2023-03-26 DIAGNOSIS — R262 Difficulty in walking, not elsewhere classified: Secondary | ICD-10-CM | POA: Diagnosis not present

## 2023-03-26 NOTE — Therapy (Signed)
OUTPATIENT PHYSICAL THERAPY LOWER EXTREMITY EVALUATION   Patient Name: Barbara Patton MRN: [id number] DOB:[birth year], 82 y.o., female Today's Date: 03/26/2023  END OF SESSION:  PT End of Session - 03/26/23 1027     Visit Number 1    Number of Visits 8    Date for PT Re-Evaluation 04/23/23    Authorization Type [employer]    PT Start Time 1030    PT Stop Time 1115    PT Time Calculation (min) 45 min    Activity Tolerance Patient tolerated treatment well    Behavior During Therapy WFL for tasks assessed/performed             Past Medical History:  Diagnosis Date   Glaucoma    High cholesterol    Hypertension    Lupus    Thyroid disease    Past Surgical History:  Procedure Laterality Date   ABDOMINAL HYSTERECTOMY     CATARACT EXTRACTION, BILATERAL     TOTAL KNEE ARTHROPLASTY Bilateral    TUBAL LIGATION     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   Menopausal symptom 10/20/2021   Overweight 10/20/2021   History of colonic polyps 10/20/2021   Urinary incontinence 10/20/2021   Globus pharyngeus 05/23/2021   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   Referred otalgia of left ear 08/23/2015   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   Allergic rhinitis 07/26/2015   Anxiety 07/26/2015   Benign essential hypertension 07/26/2015   Dysfunction of left eustachian tube 07/26/2015   Esophageal reflux 07/26/2015   Fibromyalgia 07/26/2015   Pure hypercholesterolemia 07/26/2015   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 05/03/2014   Pes anserinus bursitis of left knee 12/04/2013   Trochanteric bursitis of left hip 08/07/2013   Leg swelling 12/06/2011   Glaucoma 11/02/2011   HLD (hyperlipidemia) 11/02/2011   HTN (hypertension)  11/02/2011   Hypothyroid 11/02/2011   OA (osteoarthritis) 11/02/2011   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 Pt states R hip is worse than the L. Pt states she was having pain in her back and they initially thought the pain in her hip is from back. States sometimes she can just get up and her hip will hurt, but other times on a good day she can go around the grocery store. Pt goes to the [facility] 3 days/wk. Does use the cane sometimes for longer distances. Has not been able to go back bowling due to her R arm and R leg/back.   PERTINENT HISTORY: No surgeries or injuries; L & R TKA, fibromyalgia  PAIN:  Are you having pain? Yes: NPRS scale: 0 at rest, 10 at worst /10 Pain location: R hip Pain description: Burning Aggravating factors: walking Relieving factors:  Rest  PRECAUTIONS: None  RED FLAGS: None   WEIGHT BEARING RESTRICTIONS: No  FALLS:  Has patient fallen in last 6 months? Yes. 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  DIAGNOSTIC FINDINGS: Lumbar MRI 10/19/22 IMPRESSION: 1. Acute/subacute mild superior endplate compression fractures at L3 and L4 with associated bone marrow edema, but no osseous retropulsion. 2. 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 3. Borderline spinal stenosis at L4-5 secondary to facet hypertrophy and a grade 1 anterolisthesis. 4. Distended urinary bladder with trabeculation, suggesting chronic bladder outlet obstruction.  PATIENT SURVEYS:  FOTO 45; predicted 50  COGNITION: Overall cognitive status: Within functional limits for tasks assessed     SENSATION: WFL  MUSCLE LENGTH: Hamstrings: Right 80 deg; Left 80 deg Thomas test: Right 5 deg; Left -15 deg  POSTURE: R lateral shift, truncal lean to R, increased weight shift to R LE, anterior pelvic tilt  PALPATION: TTP bilat glute med/max, piriformis  LOWER EXTREMITY ROM: see thomas test and hamstring test   Active ROM Right eval Left eval  Hip flexion    Hip extension    Hip abduction    Hip adduction    Hip internal rotation    Hip external rotation    Knee flexion    Knee extension    Ankle dorsiflexion    Ankle plantarflexion    Ankle inversion    Ankle eversion     (Blank rows = not tested)  LOWER EXTREMITY MMT:  MMT Right eval Left eval  Hip flexion 4 3+  Hip extension 3- 2+  Hip abduction 3- 3-  Hip adduction    Hip internal rotation    Hip external rotation    Knee flexion 5 4  Knee extension 5 5  Ankle dorsiflexion    Ankle plantarflexion    Ankle inversion    Ankle eversion     (Blank rows = not tested)  LOWER EXTREMITY SPECIAL TESTS:  Hip special tests: Barbara Patton (FABER) test: positive , Trendelenburg test: positive , and Thomas test: positive  all (+) on L  FUNCTIONAL TESTS:  5 times sit to stand: 21.25 sec SLS: 2 sec on R, 1 sec on L   GAIT: Distance walked:  x50' Assistive device utilized: None Level of assistance: Complete Independence Comments: Decreased heel strike bilaterally (when cued to do so, pt states it increases R low back pain), antalgic, L LE slightly externally rotated, wide BOS   TODAY'S TREATMENT:  DATE: 03/26/23 See HEP below    PATIENT EDUCATION:  Education details: Exam findings, POC, initial HEP Person educated: Patient Education method: Explanation, Demonstration, and Handouts Education comprehension: verbalized understanding, returned demonstration, and needs further education  HOME EXERCISE PROGRAM: Access Code: [email or web address] URL: [email or web address] Date: 03/26/2023 Prepared by: Vernon Prey April Kirstie Peri  Exercises - Isometric Gluteus Medius at Wall  - 1 x daily - 7 x weekly - 1 sets - 10 reps - 3 sec hold - Supine Butterfly Groin Stretch  - 1 x daily - 7 x weekly - 2 sets - 30 sec hold - Seated Hip Flexor Stretch  - 1 x daily - 7 x weekly - 2 sets - 30 sec hold - Standing Hip Extension with Counter Support  (glute iso with tap foot back) - 1 x daily - 7 x weekly - 2 sets - 10 reps - 3 sec hold  ASSESSMENT:  CLINICAL IMPRESSION: Patient is a 82 y.o. F who was seen today for physical therapy evaluation and treatment for hip pain. PMH significant for chronic back pain and fibromyalgia. Assessment significant for gross bilat hip weakness, postural abnormalities, and decreased balance/stability affecting functional mobility, t/fs, and amb. Findings demo that although R LE is stronger than L, R hip pain might primarily be due to overuse/increased weight on R vs L leg. L LE with decreased flexibility in her hip flexors and adductors compared to right. Pt will benefit from PT to address these issues.   OBJECTIVE IMPAIRMENTS: Abnormal gait, decreased activity tolerance, decreased balance,  decreased endurance, decreased mobility, difficulty walking, decreased ROM, decreased strength, hypomobility, increased fascial restrictions, increased muscle spasms, impaired flexibility, improper body mechanics, postural dysfunction, and pain.   ACTIVITY LIMITATIONS: bending, standing, squatting, sleeping, stairs, transfers, bed mobility, bathing, hygiene/grooming, and locomotion level  PARTICIPATION LIMITATIONS: meal prep, cleaning, laundry, shopping, and community activity  PERSONAL FACTORS: Fitness, Past/current experiences, and Time since onset of injury/illness/exacerbation are also affecting patient's functional outcome.   REHAB POTENTIAL: Good  CLINICAL DECISION MAKING: Evolving/moderate complexity  EVALUATION COMPLEXITY: Moderate   GOALS: Goals reviewed with patient? Yes  SHORT TERM GOALS: Target date: 04/09/2023  Pt will be ind with initial HEP Baseline: Goal status: INITIAL  2.  Pt will have improved hip extension in prone to at least 3/5 to demo decreasing hip flexor tightness and increasing glute strength for single leg stability Baseline:  Goal status: INITIAL  3.  Pt will be able to perform SLS for at least 5 sec to demo increasing stability and balance for bowling Baseline:  Goal status: INITIAL   LONG TERM GOALS: Target date: 04/23/2023   Pt will be ind with management of her HEP Baseline:  Goal status: INITIAL  2.  Pt will be able to amb x 1000' for improved community mobility with </=3/10 hip pain per pt's goals Baseline:  Goal status: INITIAL  3.  Pt will be able to perform 5x STS in </=15 sec to demo increasing functional LE strength Baseline:  Goal status: INITIAL  4.  Pt will have increased FOTO to >/=50 Baseline:  Goal status: INITIAL    PLAN:  PT FREQUENCY: 2x/week  PT DURATION: 4 weeks per Dr. Diamantina Providence referral  PLANNED INTERVENTIONS: [phone]- PT Re-evaluation, 97110-Therapeutic exercises, 97530- Therapeutic activity, [id number]-  Neuromuscular re-education, 97535- Self Care, [id number]- Manual therapy, [id number]- Gait training, [id number]- Aquatic Therapy, 97014- Electrical stimulation (unattended), [phone]- Ionotophoresis 4mg /ml Dexamethasone, Balance training, Taping, Dry Needling, Joint mobilization, Cryotherapy, and Moist heat  PLAN FOR NEXT SESSION:  Trial of TPDN for glutes. Continue stretching and strengthening hips. Work on leg stabilization/balance and improving weight shifting.   Maurie Olesen April Ma L Rashema Seawright, PT 03/26/2023, 12:50 PM

## 2023-03-28 ENCOUNTER — Encounter: Payer: Self-pay | Admitting: Rehabilitative and Restorative Service Providers"

## 2023-03-28 ENCOUNTER — Other Ambulatory Visit: Payer: Self-pay

## 2023-03-28 ENCOUNTER — Telehealth: Payer: Self-pay | Admitting: Neurology

## 2023-03-28 ENCOUNTER — Ambulatory Visit: Payer: Medicare PPO | Admitting: Rehabilitative and Restorative Service Providers"

## 2023-03-28 DIAGNOSIS — R262 Difficulty in walking, not elsewhere classified: Secondary | ICD-10-CM

## 2023-03-28 DIAGNOSIS — M79604 Pain in right leg: Secondary | ICD-10-CM | POA: Diagnosis not present

## 2023-03-28 DIAGNOSIS — R251 Tremor, unspecified: Secondary | ICD-10-CM

## 2023-03-28 DIAGNOSIS — M6281 Muscle weakness (generalized): Secondary | ICD-10-CM | POA: Diagnosis not present

## 2023-03-28 DIAGNOSIS — R2681 Unsteadiness on feet: Secondary | ICD-10-CM

## 2023-03-28 MED ORDER — PRIMIDONE 50 MG PO TABS
50.0000 mg | ORAL_TABLET | Freq: Two times a day (BID) | ORAL | 0 refills | Status: DC
Start: 2023-03-28 — End: 2023-05-08

## 2023-03-28 NOTE — Therapy (Signed)
OUTPATIENT PHYSICAL THERAPY LOWER EXTREMITY TREATMENT   Patient Name: Barbara Patton MRN: [id number] DOB:[birth year], 82 y.o., female Today's Date: 03/28/2023  END OF SESSION:  PT End of Session - 03/28/23 1257     Visit Number 2    Number of Visits 8    Date for PT Re-Evaluation 04/23/23    Authorization Type [employer]    PT Start Time 1256    PT Stop Time 1345    PT Time Calculation (min) 49 min    Activity Tolerance Patient tolerated treatment well;No increased pain    Behavior During Therapy WFL for tasks assessed/performed              Past Medical History:  Diagnosis Date   Glaucoma    High cholesterol    Hypertension    Lupus    Thyroid disease    Past Surgical History:  Procedure Laterality Date   ABDOMINAL HYSTERECTOMY     CATARACT EXTRACTION, BILATERAL     TOTAL KNEE ARTHROPLASTY Bilateral    TUBAL LIGATION     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   Menopausal symptom 10/20/2021   Overweight 10/20/2021   History of colonic polyps 10/20/2021   Urinary incontinence 10/20/2021   Globus pharyngeus 05/23/2021   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   Referred otalgia of left ear 08/23/2015   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   Allergic rhinitis 07/26/2015   Anxiety 07/26/2015   Benign essential hypertension 07/26/2015   Dysfunction of left eustachian tube 07/26/2015   Esophageal reflux 07/26/2015   Fibromyalgia 07/26/2015   Pure hypercholesterolemia 07/26/2015   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 05/03/2014   Pes anserinus bursitis of left knee 12/04/2013   Trochanteric bursitis of left hip 08/07/2013   Leg swelling 12/06/2011   Glaucoma 11/02/2011   HLD (hyperlipidemia) 11/02/2011   HTN  (hypertension) 11/02/2011   Hypothyroid 11/02/2011   OA (osteoarthritis) 11/02/2011   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  She has lateral gluteal (trochanteric bursa) and groin (hip) pain.  Pt states she's been given shots for her hips but hasn't been working. Pt states R hip is worse than the L. Pt states she was having pain in her back and they initially thought the pain in her hip is from back. States sometimes she can just get up and her hip will hurt, but other times on a good day she can go around the grocery store. Pt goes to the [facility] 3 days/wk. Does use the cane sometimes for longer distances. Has not been able to go back bowling due to her R arm and R leg/back.   PERTINENT HISTORY: No surgeries or injuries; L & R TKA, fibromyalgia  PAIN:  Are you having pain? Yes: NPRS  scale: 0 at rest, 10 at worst /10 Pain location: R hip Pain description: Burning Aggravating factors: walking Relieving factors: Rest  PRECAUTIONS: None  RED FLAGS: None   WEIGHT BEARING RESTRICTIONS: No  FALLS:  Has patient fallen in last 6 months? Yes. 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  DIAGNOSTIC FINDINGS: Lumbar MRI 10/19/22 IMPRESSION: 1. Acute/subacute mild superior endplate compression fractures at L3 and L4 with associated bone marrow edema, but no osseous retropulsion. 2. 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 3. Borderline spinal stenosis at L4-5 secondary to facet hypertrophy and a grade 1 anterolisthesis. 4. Distended urinary bladder with trabeculation, suggesting chronic bladder outlet obstruction.  PATIENT SURVEYS:  FOTO 45; predicted 50  COGNITION: Overall cognitive status: Within functional limits for tasks assessed     SENSATION: WFL  MUSCLE LENGTH: Hamstrings: Right 80 deg; Left 80 deg Thomas test: Right 5 deg; Left -15 deg  POSTURE: R lateral shift, truncal lean to R, increased weight shift to R LE, anterior pelvic tilt  PALPATION: TTP bilat glute med/max, piriformis  LOWER EXTREMITY ROM: see thomas test and hamstring test   Active ROM Left/Right 03/28/2023   Hip flexion 80/90   Hip extension    Hip abduction    Hip adduction    Hip internal rotation -1/-3   Hip external rotation 25/44   Knee flexion    Knee extension    Ankle dorsiflexion    Ankle plantarflexion    Ankle inversion    Ankle eversion    Hamstrings flexibility 45/50    (Blank rows = not tested)  LOWER EXTREMITY MMT:  MMT Right eval Left eval  Hip flexion 4 3+  Hip extension 3- 2+  Hip abduction 3- 3-  Hip adduction    Hip internal rotation    Hip external rotation    Knee flexion 5 4  Knee extension 5 5  Ankle dorsiflexion    Ankle plantarflexion    Ankle inversion    Ankle eversion     (Blank rows = not tested)  LOWER EXTREMITY SPECIAL TESTS:  03/28/2023: Positive Ober's bilateral  Evaluation: Hip  special tests: Barbara Patton (FABER) test: positive , Trendelenburg test: positive , and Thomas test: positive  all (+) on L  FUNCTIONAL TESTS:  5 times sit to stand: 21.25 sec SLS: 2 sec on R, 1 sec on L   GAIT: Distance walked: x50' Assistive device utilized: None Level of assistance: Complete Independence Comments: Decreased heel strike bilaterally (when cued to do so, pt states it increases R low back pain), antalgic, L LE slightly externally rotated, wide BOS   TODAY'S TREATMENT:  DATE:  03/28/2023 Supine IT Band stretch 4 x 20 seconds (with strap) bilateral Supine butterfly groin stretch 4 x 20 seconds Supine figure 4 stretch (left only) with push 4 x 20 seconds Isometric gluteus medius at wall 2 sets of 5 for 3 seconds Seated hip flexors stretch 4 x 20 seconds bilateral Standing hip extension 10 x 3 seconds   03/26/23 See HEP below    PATIENT EDUCATION:  Education details: Exam findings, POC, initial HEP Person educated: Patient Education method: Explanation, Demonstration, and Handouts Education comprehension: verbalized understanding, returned demonstration, and needs further education  HOME EXERCISE PROGRAM: Access Code: [email or web address] URL: [email or web address] Date: 03/26/2023 Prepared by: Vernon Prey April Kirstie Peri  Exercises - Isometric Gluteus Medius at Wall  - 1 x daily - 7 x weekly - 1 sets - 10 reps - 3 sec hold - Supine Butterfly Groin Stretch  - 1 x daily - 7 x weekly - 2 sets - 30 sec hold - Seated Hip Flexor Stretch  - 1 x daily - 7 x weekly - 2 sets - 30 sec hold - Standing Hip Extension with Counter Support  (glute iso with tap foot back) - 1 x daily - 7 x weekly - 2 sets - 10 reps - 3 sec hold  ASSESSMENT:  CLINICAL IMPRESSION: Latona has very tight IT Bands bilaterally in addition to AROM and strength impairments  noted at evaluation.  We reviewed her day 1 HEP along with 2 flexibility progressions to address IT Band and left hip ER tightness.  Her prognosis to meet long-term goals is good with the recommended plan of care.  OBJECTIVE IMPAIRMENTS: Abnormal gait, decreased activity tolerance, decreased balance, decreased endurance, decreased mobility, difficulty walking, decreased ROM, decreased strength, hypomobility, increased fascial restrictions, increased muscle spasms, impaired flexibility, improper body mechanics, postural dysfunction, and pain.   ACTIVITY LIMITATIONS: bending, standing, squatting, sleeping, stairs, transfers, bed mobility, bathing, hygiene/grooming, and locomotion level  PARTICIPATION LIMITATIONS: meal prep, cleaning, laundry, shopping, and community activity  PERSONAL FACTORS: Fitness, Past/current experiences, and Time since onset of injury/illness/exacerbation are also affecting patient's functional outcome.   REHAB POTENTIAL: Good  CLINICAL DECISION MAKING: Evolving/moderate complexity  EVALUATION COMPLEXITY: Moderate   GOALS: Goals reviewed with patient? Yes  SHORT TERM GOALS: Target date: 04/09/2023  Pt will be ind with initial HEP Baseline: Goal status: On Going 03/28/2023  2.  Pt will have improved hip extension in prone to at least 3/5 to demo decreasing hip flexor tightness and increasing glute strength for single leg stability Baseline:  Goal status: INITIAL  3.  Pt will be able to perform SLS for at least 5 sec to demo increasing stability and balance for bowling Baseline:  Goal status: INITIAL   LONG TERM GOALS: Target date: 04/23/2023   Pt will be ind with management of her HEP Baseline:  Goal status: INITIAL  2.  Pt will be able to amb x 1000' for improved community mobility with </=3/10 hip pain per pt's goals Baseline:  Goal status: INITIAL  3.  Pt will be able to perform 5x STS in </=15 sec to demo increasing functional LE  strength Baseline:  Goal status: INITIAL  4.  Pt will have increased FOTO to >/=50 Baseline:  Goal status: INITIAL    PLAN:  PT FREQUENCY: 2x/week  PT DURATION: 4 weeks per Dr. Diamantina Providence referral  PLANNED INTERVENTIONS: 62952- PT Re-evaluation, 97110-Therapeutic exercises, 97530- Therapeutic activity, [id number]- Neuromuscular re-education, 97535- Self Care, [id number]- Manual therapy, [id number]- Gait training, [phone]- Aquatic Therapy,  27253- Electrical stimulation (unattended), [id number]- Ionotophoresis 4mg /ml Dexamethasone, Balance training, Taping, Dry Needling, Joint mobilization, Cryotherapy, and Moist heat  PLAN FOR NEXT SESSION: Trial of TPDN for glutes. Continue stretching and strengthening hips. Work on leg stabilization/balance and improving weight shifting.  Review current HEP.   Cherlyn Cushing, PT, MPT 03/28/2023, 3:30 PM

## 2023-03-28 NOTE — Telephone Encounter (Signed)
1. Which medications need refilled? (List name and dosage, if known) primidone - was told we denied it  2. Which pharmacy/location is medication to be sent to? (include street and city if local pharmacy) [facility]

## 2023-04-02 ENCOUNTER — Encounter: Payer: Self-pay | Admitting: Physical Therapy

## 2023-04-02 ENCOUNTER — Ambulatory Visit (INDEPENDENT_AMBULATORY_CARE_PROVIDER_SITE_OTHER): Payer: Medicare PPO | Admitting: Physical Therapy

## 2023-04-02 DIAGNOSIS — R262 Difficulty in walking, not elsewhere classified: Secondary | ICD-10-CM | POA: Diagnosis not present

## 2023-04-02 DIAGNOSIS — M79604 Pain in right leg: Secondary | ICD-10-CM

## 2023-04-02 DIAGNOSIS — R2681 Unsteadiness on feet: Secondary | ICD-10-CM

## 2023-04-02 DIAGNOSIS — M6281 Muscle weakness (generalized): Secondary | ICD-10-CM

## 2023-04-02 NOTE — Therapy (Signed)
OUTPATIENT PHYSICAL THERAPY LOWER EXTREMITY TREATMENT   Patient Name: Barbara Patton MRN: [id number] DOB:1941-02-11, 82 y.o., female Today's Date: 04/02/2023  END OF SESSION:  PT End of Session - 04/02/23 1148     Visit Number 3    Number of Visits 8    Date for PT Re-Evaluation 04/23/23    Authorization Type [employer]    PT Start Time 1147    PT Stop Time 1227    PT Time Calculation (min) 40 min    Activity Tolerance Patient tolerated treatment well;No increased pain    Behavior During Therapy WFL for tasks assessed/performed               Past Medical History:  Diagnosis Date   Glaucoma    High cholesterol    Hypertension    Lupus    Thyroid disease    Past Surgical History:  Procedure Laterality Date   ABDOMINAL HYSTERECTOMY     CATARACT EXTRACTION, BILATERAL     TOTAL KNEE ARTHROPLASTY Bilateral    TUBAL LIGATION     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   Menopausal symptom 10/20/2021   Overweight 10/20/2021   History of colonic polyps 10/20/2021   Urinary incontinence 10/20/2021   Globus pharyngeus 05/23/2021   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   Referred otalgia of left ear 08/23/2015   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   Allergic rhinitis 07/26/2015   Anxiety 07/26/2015   Benign essential hypertension 07/26/2015   Dysfunction of left eustachian tube 07/26/2015   Esophageal reflux 07/26/2015   Fibromyalgia 07/26/2015   Pure hypercholesterolemia 07/26/2015   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 05/03/2014   Pes anserinus bursitis of left knee 12/04/2013   Trochanteric bursitis of left hip 08/07/2013   Leg swelling 12/06/2011   Glaucoma 11/02/2011   HLD (hyperlipidemia) 11/02/2011    HTN (hypertension) 11/02/2011   Hypothyroid 11/02/2011   OA (osteoarthritis) 11/02/2011   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 Its getting a little better and its not as bad as it used to be. I'm not rubbing it as much as I was.   PERTINENT HISTORY: No surgeries or injuries; L & R TKA, fibromyalgia  PAIN:  Are you having pain? Yes: NPRS scale: "about halfway" /10 Pain location: R hip, L lateral thigh, back  Pain description: "just a normal hurt its not burning like it was"  Aggravating factors: not sure, it just comes up sometimes when I'm not doing anything Relieving factors: Rest , rubbing area  PRECAUTIONS: None  RED FLAGS: None   WEIGHT BEARING RESTRICTIONS: No  FALLS:  Has patient fallen in last 6 months? Yes. 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  DIAGNOSTIC FINDINGS: Lumbar MRI 10/19/22 IMPRESSION: 1. Acute/subacute mild superior endplate compression fractures  at L3 and L4 with associated bone marrow edema, but no osseous retropulsion. 2. 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 3. Borderline spinal stenosis at L4-5 secondary to facet hypertrophy and a grade 1 anterolisthesis. 4. Distended urinary bladder with trabeculation, suggesting chronic bladder outlet obstruction.  PATIENT SURVEYS:  FOTO 45; predicted 50  COGNITION: Overall cognitive status: Within functional limits for tasks assessed     SENSATION: WFL  MUSCLE LENGTH: Hamstrings: Right 80 deg; Left 80 deg Thomas test: Right 5 deg; Left -15 deg  POSTURE: R lateral shift, truncal lean to R, increased weight shift to R LE, anterior pelvic tilt  PALPATION: TTP bilat glute med/max, piriformis  LOWER EXTREMITY ROM: see thomas test and hamstring test   Active ROM Left/Right 03/28/2023   Hip flexion 80/90   Hip extension    Hip abduction    Hip adduction    Hip internal rotation -1/-3   Hip external rotation 25/44   Knee flexion    Knee extension    Ankle dorsiflexion    Ankle plantarflexion    Ankle inversion    Ankle eversion    Hamstrings flexibility 45/50    (Blank rows = not tested)  LOWER EXTREMITY MMT:  MMT Right eval Left eval  Hip flexion 4 3+  Hip extension 3- 2+  Hip abduction 3- 3-  Hip adduction    Hip internal rotation    Hip external rotation    Knee flexion 5 4  Knee extension 5 5  Ankle dorsiflexion    Ankle plantarflexion    Ankle inversion    Ankle eversion     (Blank rows = not tested)  LOWER EXTREMITY SPECIAL TESTS:  03/28/2023: Positive Ober's bilateral  Evaluation: Hip special tests: Luisa Hart (FABER) test: positive , Trendelenburg test: positive , and Thomas test: positive  all (+) on L  FUNCTIONAL TESTS:  5 times sit to stand: 21.25 sec SLS: 2 sec on R, 1 sec on L   GAIT: Distance walked: x50' Assistive device utilized: None Level of assistance: Complete  Independence Comments: Decreased heel strike bilaterally (when cued to do so, pt states it increases R low back pain), antalgic, L LE slightly externally rotated, wide BOS   TODAY'S TREATMENT:                                                                                                                              DATE:   04/02/23  TherEx  Nustep L4x6 minutes BLEs only  Supine butterfly stretch 10x5 second holds  Supine figure 4 stretch 3x30 seconds B  Lumbar rotation stretch 6x5 seconds B  Supine HS stretches with strap 2x30 seconds B  Bridges x10 Standing hip extensions x12 B Standing hip ABD x12 B Standing hip hikes x10 B Standing 3D hip excursions x10 all directions    03/28/2023 Supine IT Band stretch 4 x 20 seconds (with strap) bilateral Supine butterfly groin stretch 4 x 20 seconds Supine figure 4 stretch (left only) with push 4 x 20 seconds Isometric gluteus medius at wall 2 sets of 5 for 3 seconds Seated hip flexors stretch 4 x 20 seconds bilateral Standing hip extension 10 x 3 seconds   03/26/23 See HEP below    PATIENT EDUCATION:  Education details: Exam findings, POC, initial HEP Person educated: Patient Education method: Explanation, Demonstration, and Handouts Education comprehension: verbalized understanding, returned demonstration, and needs further education  HOME EXERCISE PROGRAM: Access Code: [email or web address] URL: [email or web address] Date: 03/26/2023 Prepared by: Vernon Prey April Kirstie Peri  Exercises - Isometric Gluteus Medius at Wall  - 1 x daily - 7 x weekly - 1 sets - 10 reps - 3 sec hold - Supine Butterfly Groin Stretch  - 1 x daily - 7 x weekly - 2 sets - 30 sec hold - Seated Hip Flexor Stretch  - 1 x daily - 7 x weekly - 2 sets - 30 sec hold - Standing Hip Extension with Counter Support  (glute iso with tap foot back) - 1 x daily - 7 x weekly - 2 sets - 10 reps - 3 sec hold  ASSESSMENT:  CLINICAL IMPRESSION:  Pt arrives  today doing OK, still having ongoing back and hip pain. Warmed up on the Nustep and then continued focus on functional strength and flexibility training as tolerated with progressions as able this morning. Tolerated all interventions well today.   OBJECTIVE IMPAIRMENTS: Abnormal gait, decreased activity tolerance, decreased balance, decreased endurance, decreased mobility, difficulty walking, decreased ROM, decreased strength, hypomobility, increased fascial restrictions, increased muscle spasms, impaired flexibility, improper body mechanics, postural dysfunction, and pain.   ACTIVITY LIMITATIONS: bending, standing, squatting, sleeping, stairs, transfers, bed mobility, bathing, hygiene/grooming, and locomotion level  PARTICIPATION LIMITATIONS: meal prep, cleaning, laundry, shopping, and community activity  PERSONAL FACTORS: Fitness, Past/current experiences, and Time since onset of injury/illness/exacerbation are also affecting patient's functional outcome.   REHAB POTENTIAL: Good  CLINICAL DECISION MAKING: Evolving/moderate complexity  EVALUATION COMPLEXITY: Moderate   GOALS: Goals reviewed with patient? Yes  SHORT TERM GOALS: Target date: 04/09/2023  Pt will be ind with initial HEP Baseline: Goal status: On Going 03/28/2023  2.  Pt will have improved hip extension in prone to at least 3/5 to demo decreasing hip flexor tightness and increasing glute strength for single leg stability Baseline:  Goal status: INITIAL  3.  Pt will be able to perform SLS for at least 5 sec to demo increasing stability and balance for bowling Baseline:  Goal status: INITIAL   LONG TERM GOALS: Target date: 04/23/2023   Pt will be ind with management of her HEP Baseline:  Goal status: INITIAL  2.  Pt will be able to amb x 1000' for improved community mobility with </=3/10 hip pain per pt's goals Baseline:  Goal status: INITIAL  3.  Pt will be able to perform 5x STS in </=15 sec to demo  increasing functional LE strength Baseline:  Goal status: INITIAL  4.  Pt will  have increased FOTO to >/=50 Baseline:  Goal status: INITIAL    PLAN:  PT FREQUENCY: 2x/week  PT DURATION: 4 weeks per Dr. Diamantina Providence referral  PLANNED INTERVENTIONS: [phone]- PT Re-evaluation, 97110-Therapeutic exercises, 97530- Therapeutic activity, [id number]- Neuromuscular re-education, 97535- Self Care, [id number]- Manual therapy, [id number]- Gait training, [id number]- Aquatic Therapy, 97014- Electrical stimulation (unattended), [phone]- Ionotophoresis 4mg /ml Dexamethasone, Balance training, Taping, Dry Needling, Joint mobilization, Cryotherapy, and Moist heat  PLAN FOR NEXT SESSION: Trial of TPDN for glutes. Continue stretching and strengthening hips. Work on leg stabilization/balance and improving weight shifting.  Review current HEP PRN. Talk about best machines to use at the gym/avoiding machines like back/ab machine and hip ABD machine     Nedra Hai, PT, DPT 04/02/23 12:27 PM

## 2023-04-03 DIAGNOSIS — Z1231 Encounter for screening mammogram for malignant neoplasm of breast: Secondary | ICD-10-CM | POA: Diagnosis not present

## 2023-04-05 ENCOUNTER — Encounter: Payer: Self-pay | Admitting: Physical Therapy

## 2023-04-05 ENCOUNTER — Ambulatory Visit (INDEPENDENT_AMBULATORY_CARE_PROVIDER_SITE_OTHER): Payer: Medicare PPO | Admitting: Physical Therapy

## 2023-04-05 DIAGNOSIS — R2681 Unsteadiness on feet: Secondary | ICD-10-CM

## 2023-04-05 DIAGNOSIS — R293 Abnormal posture: Secondary | ICD-10-CM | POA: Diagnosis not present

## 2023-04-05 DIAGNOSIS — M6281 Muscle weakness (generalized): Secondary | ICD-10-CM

## 2023-04-05 DIAGNOSIS — M79604 Pain in right leg: Secondary | ICD-10-CM

## 2023-04-05 DIAGNOSIS — R262 Difficulty in walking, not elsewhere classified: Secondary | ICD-10-CM | POA: Diagnosis not present

## 2023-04-05 DIAGNOSIS — M5459 Other low back pain: Secondary | ICD-10-CM

## 2023-04-05 NOTE — Therapy (Signed)
OUTPATIENT PHYSICAL THERAPY LOWER EXTREMITY TREATMENT   Patient Name: Barbara Patton MRN: [id number] DOB:[birth year], 82 y.o., female Today's Date: 04/05/2023  END OF SESSION:  PT End of Session - 04/05/23 1123     Visit Number 4    Number of Visits 8    Date for PT Re-Evaluation 04/23/23    Authorization Type [employer]    PT Start Time 1120    PT Stop Time 1205    PT Time Calculation (min) 45 min    Activity Tolerance Patient tolerated treatment well;No increased pain    Behavior During Therapy WFL for tasks assessed/performed               Past Medical History:  Diagnosis Date   Glaucoma    High cholesterol    Hypertension    Lupus    Thyroid disease    Past Surgical History:  Procedure Laterality Date   ABDOMINAL HYSTERECTOMY     CATARACT EXTRACTION, BILATERAL     TOTAL KNEE ARTHROPLASTY Bilateral    TUBAL LIGATION     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   Menopausal symptom 10/20/2021   Overweight 10/20/2021   History of colonic polyps 10/20/2021   Urinary incontinence 10/20/2021   Globus pharyngeus 05/23/2021   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   Referred otalgia of left ear 08/23/2015   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   Allergic rhinitis 07/26/2015   Anxiety 07/26/2015   Benign essential hypertension 07/26/2015   Dysfunction of left eustachian tube 07/26/2015   Esophageal reflux 07/26/2015   Fibromyalgia 07/26/2015   Pure hypercholesterolemia 07/26/2015   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 05/03/2014   Pes anserinus bursitis of left knee 12/04/2013   Trochanteric bursitis of left hip 08/07/2013   Leg swelling 12/06/2011   Glaucoma 11/02/2011   HLD (hyperlipidemia) 11/02/2011    HTN (hypertension) 11/02/2011   Hypothyroid 11/02/2011   OA (osteoarthritis) 11/02/2011   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 Reports she did the nustep and chair yoga. Has not been doing her hip stretches.   PERTINENT HISTORY: No surgeries or injuries; L & R TKA, fibromyalgia  PAIN:  Are you having pain? Yes: NPRS scale: "about halfway" /10 Pain location: R hip, L lateral thigh, back  Pain description: "just a normal hurt its not burning like it was"  Aggravating factors: not sure, it just comes up sometimes when I'm not doing anything Relieving factors: Rest , rubbing area  PRECAUTIONS: None  RED FLAGS: None   WEIGHT BEARING RESTRICTIONS: No  FALLS:  Has patient fallen in last 6 months? Yes. 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  DIAGNOSTIC FINDINGS: Lumbar MRI 10/19/22 IMPRESSION: 1. Acute/subacute mild superior endplate compression  fractures at L3 and L4 with associated bone marrow edema, but no osseous retropulsion. 2. 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 3. Borderline spinal stenosis at L4-5 secondary to facet hypertrophy and a grade 1 anterolisthesis. 4. Distended urinary bladder with trabeculation, suggesting chronic bladder outlet obstruction.  PATIENT SURVEYS:  FOTO 45; predicted 50  COGNITION: Overall cognitive status: Within functional limits for tasks assessed     SENSATION: WFL  MUSCLE LENGTH: Thomas test: Right 5 deg; Left -15 deg  POSTURE: R lateral shift, truncal lean to R, increased weight shift to R LE, anterior pelvic tilt  PALPATION: TTP bilat glute med/max, piriformis  LOWER EXTREMITY ROM: see thomas test and hamstring test   Active ROM Left/Right 03/28/2023   Hip flexion 80/90   Hip extension    Hip abduction    Hip adduction    Hip internal rotation -1/-3   Hip external rotation 25/44   Knee flexion    Knee extension    Ankle dorsiflexion    Ankle plantarflexion    Ankle inversion    Ankle eversion    Hamstrings flexibility 45/50    (Blank rows = not tested)  LOWER EXTREMITY MMT:  MMT Right eval Left eval  Hip flexion 4 3+  Hip extension 3- 2+  Hip abduction 3- 3-  Hip adduction    Hip internal rotation    Hip external rotation    Knee flexion 5 4  Knee extension 5 5  Ankle dorsiflexion    Ankle plantarflexion    Ankle inversion    Ankle eversion     (Blank rows = not tested)  LOWER EXTREMITY SPECIAL TESTS:  03/28/2023: Positive Ober's bilateral  Evaluation: Hip special tests: Luisa Hart (FABER) test: positive , Trendelenburg test: positive , and Thomas test: positive  all (+) on L  FUNCTIONAL TESTS:  5 times sit to stand: 21.25 sec SLS: 2 sec on R, 1 sec on L   GAIT: Distance walked: x50' Assistive device utilized: None Level of assistance: Complete Independence Comments: Decreased heel  strike bilaterally (when cued to do so, pt states it increases R low back pain), antalgic, L LE slightly externally rotated, wide BOS   TODAY'S TREATMENT:                                                                                                                              DATE:  04/05/23 Supine  Butterfly stretch 2x 30 sec LTR 5x 10 sec  ITB stretch with strap 2x 30 sec Piriformis stretch x30 sec Bridge with green TB around knees 2x10 Thomas stretch x30 sec Quad set + glute set 10x3 sec Sidelying  Clamshell green TB 2x10 Standing  Isometric gluteus medius at wall 2 x5x 3"  Donkey kick 2x10  Staggered stance glute set with overhead reach 2x10 Manual therapy: STM & TPR L quad and hip flexor   04/02/23 TherEx  Nustep L4x6 minutes BLEs only  Supine butterfly stretch 10x5 second holds  Supine figure 4 stretch 3x30 seconds B  Lumbar rotation stretch 6x5 seconds B  Supine HS stretches with strap 2x30 seconds B  Bridges x10 Standing hip extensions x12 B Standing hip ABD x12 B Standing hip hikes x10 B Standing 3D hip excursions x10 all directions    03/28/2023 Supine IT Band stretch 4 x 20 seconds (with strap) bilateral Supine butterfly groin stretch 4 x 20 seconds Supine figure 4 stretch (left only) with push 4 x 20 seconds Isometric gluteus medius at wall 2 sets of 5 for 3 seconds Seated hip flexors stretch 4 x 20 seconds bilateral Standing hip extension 10 x 3 seconds   03/26/23 See HEP below    PATIENT EDUCATION:  Education details: Exam findings, POC, initial HEP Person educated: Patient Education method: Explanation, Demonstration, and Handouts Education comprehension: verbalized understanding, returned demonstration, and needs further education  HOME EXERCISE PROGRAM: Access Code: [email or web address] URL: [email or web address] Date: 03/26/2023 Prepared by: Vernon Prey April Kirstie Peri  Exercises - Isometric Gluteus Medius at Wall  - 1 x daily - 7 x  weekly - 1 sets - 10 reps - 3 sec hold - Supine Butterfly Groin Stretch  - 1 x daily - 7 x weekly - 2 sets - 30 sec hold - Seated Hip Flexor Stretch  - 1 x daily - 7 x weekly - 2 sets - 30 sec hold - Standing Hip Extension with Counter Support  (glute iso with tap foot back) - 1 x daily - 7 x weekly - 2 sets - 10 reps - 3 sec hold  ASSESSMENT:  CLINICAL IMPRESSION: Continued to focus on hip mobility and strengthening. Ms. Caytlyn remains very limited with hip extension/glute activation due to tightness and shortening of hip flexors/quads. Provided gentle manual work and stretching to address this. Deferred TPDN  OBJECTIVE IMPAIRMENTS: Abnormal gait, decreased activity tolerance, decreased balance, decreased endurance, decreased mobility, difficulty walking, decreased ROM, decreased strength, hypomobility, increased fascial restrictions, increased muscle spasms, impaired flexibility, improper body mechanics, postural dysfunction, and pain.     GOALS: Goals reviewed with patient? Yes  SHORT TERM GOALS: Target date: 04/09/2023  Pt will be ind with initial HEP Baseline: Goal status: On Going 03/28/2023  2.  Pt will have improved hip extension in prone to at least 3/5 to demo decreasing hip flexor tightness and increasing glute strength for single leg stability Baseline:  Goal status: INITIAL  3.  Pt will be able to perform SLS for at least 5 sec to demo increasing stability and balance for bowling Baseline:  Goal status: INITIAL   LONG TERM GOALS: Target date: 04/23/2023   Pt will be ind with management of her HEP Baseline:  Goal status: INITIAL  2.  Pt will be able to amb x 1000' for improved community mobility with </=3/10 hip pain per pt's goals Baseline:  Goal status: INITIAL  3.  Pt will be able to perform 5x STS in </=15 sec to demo increasing functional LE strength Baseline:  Goal status: INITIAL  4.  Pt will have increased FOTO to >/=50 Baseline:  Goal status:  INITIAL    PLAN:  PT FREQUENCY: 2x/week  PT DURATION: 4 weeks per Dr. Diamantina Providence referral  PLANNED INTERVENTIONS: [phone]- PT Re-evaluation, 97110-Therapeutic exercises, 97530- Therapeutic activity, [id number]- Neuromuscular re-education, 97535- Self Care, [id number]- Manual therapy, [id number]- Gait training, [id number]- Aquatic Therapy, 97014- Electrical stimulation (unattended), 97033- Ionotophoresis 4mg /ml Dexamethasone, Balance training, Taping, Dry Needling, Joint mobilization, Cryotherapy, and Moist heat  PLAN FOR NEXT SESSION: Trial of TPDN for glutes. Continue stretching and strengthening hips. Work on leg stabilization/balance and improving weight shifting.  Review current HEP PRN. Talk about best machines to use at the gym/avoiding machines like back/ab machine and hip ABD machine     Jenean Escandon April [place], PT, DPT 04/05/23 11:44 AM

## 2023-04-11 ENCOUNTER — Ambulatory Visit (INDEPENDENT_AMBULATORY_CARE_PROVIDER_SITE_OTHER): Payer: Medicare PPO | Admitting: Physical Therapy

## 2023-04-11 ENCOUNTER — Encounter: Payer: Self-pay | Admitting: Physical Therapy

## 2023-04-11 DIAGNOSIS — M79604 Pain in right leg: Secondary | ICD-10-CM

## 2023-04-11 DIAGNOSIS — R262 Difficulty in walking, not elsewhere classified: Secondary | ICD-10-CM | POA: Diagnosis not present

## 2023-04-11 DIAGNOSIS — M6281 Muscle weakness (generalized): Secondary | ICD-10-CM | POA: Diagnosis not present

## 2023-04-11 DIAGNOSIS — R2681 Unsteadiness on feet: Secondary | ICD-10-CM

## 2023-04-11 NOTE — Therapy (Signed)
OUTPATIENT PHYSICAL THERAPY LOWER EXTREMITY TREATMENT   Patient Name: Barbara Patton MRN: [id number] DOB:1940-12-10, 82 y.o., female Today's Date: 04/11/2023  END OF SESSION:  PT End of Session - 04/11/23 1352     Visit Number 5    Number of Visits 8    Date for PT Re-Evaluation 04/23/23    Authorization Type [employer]    PT Start Time 1350    PT Stop Time 1428    PT Time Calculation (min) 38 min    Activity Tolerance Patient tolerated treatment well;No increased pain    Behavior During Therapy WFL for tasks assessed/performed                Past Medical History:  Diagnosis Date   Glaucoma    High cholesterol    Hypertension    Lupus    Thyroid disease    Past Surgical History:  Procedure Laterality Date   ABDOMINAL HYSTERECTOMY     CATARACT EXTRACTION, BILATERAL     TOTAL KNEE ARTHROPLASTY Bilateral    TUBAL LIGATION     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   Menopausal symptom 10/20/2021   Overweight 10/20/2021   History of colonic polyps 10/20/2021   Urinary incontinence 10/20/2021   Globus pharyngeus 05/23/2021   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   Referred otalgia of left ear 08/23/2015   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   Allergic rhinitis 07/26/2015   Anxiety 07/26/2015   Benign essential hypertension 07/26/2015   Dysfunction of left eustachian tube 07/26/2015   Esophageal reflux 07/26/2015   Fibromyalgia 07/26/2015   Pure hypercholesterolemia 07/26/2015   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 05/03/2014   Pes anserinus bursitis of left knee 12/04/2013   Trochanteric bursitis of left hip 08/07/2013   Leg swelling 12/06/2011   Glaucoma 11/02/2011   HLD (hyperlipidemia) 11/02/2011    HTN (hypertension) 11/02/2011   Hypothyroid 11/02/2011   OA (osteoarthritis) 11/02/2011   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  Has been going to the gym pretty consistently; taking classes   PERTINENT HISTORY: No surgeries or injuries; L & R TKA, fibromyalgia  PAIN:  Are you having pain? Yes: NPRS scale: 7/10 Pain location: R hip, L lateral thigh, back  Pain description: "just a normal hurt its not burning like it was"  Aggravating factors: not sure, it just comes up sometimes when I'm not doing anything Relieving factors: Rest , rubbing area  PRECAUTIONS: None  RED FLAGS: None   WEIGHT BEARING RESTRICTIONS: No  FALLS:  Has patient fallen in last 6 months? Yes. 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  DIAGNOSTIC FINDINGS: Lumbar MRI 10/19/22 IMPRESSION: 1. Acute/subacute mild superior endplate compression fractures at L3 and L4 with associated bone marrow edema, but no  osseous retropulsion. 2. 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 3. Borderline spinal stenosis at L4-5 secondary to facet hypertrophy and a grade 1 anterolisthesis. 4. Distended urinary bladder with trabeculation, suggesting chronic bladder outlet obstruction.  PATIENT SURVEYS:  FOTO 45; predicted 50  COGNITION: Overall cognitive status: Within functional limits for tasks assessed     SENSATION: WFL  MUSCLE LENGTH: Thomas test: Right 5 deg; Left -15 deg  POSTURE: R lateral shift, truncal lean to R, increased weight shift to R LE, anterior pelvic tilt  PALPATION: TTP bilat glute med/max, piriformis  LOWER EXTREMITY ROM: see thomas test and hamstring test   Active ROM Left/Right 03/28/2023   Hip flexion 80/90   Hip extension    Hip abduction    Hip adduction    Hip internal rotation -1/-3   Hip external rotation 25/44   Knee flexion    Knee extension    Ankle dorsiflexion    Ankle plantarflexion    Ankle inversion    Ankle eversion    Hamstrings flexibility 45/50    (Blank rows = not tested)  LOWER EXTREMITY MMT:  MMT Right eval Left eval  Hip flexion 4 3+  Hip extension 3- 2+  Hip abduction 3- 3-  Hip adduction    Hip internal rotation    Hip external rotation    Knee flexion 5 4  Knee extension 5 5  Ankle dorsiflexion    Ankle plantarflexion    Ankle inversion    Ankle eversion     (Blank rows = not tested)  LOWER EXTREMITY SPECIAL TESTS:  03/28/2023: Positive Ober's bilateral  Evaluation: Hip special tests: Luisa Hart (FABER) test: positive , Trendelenburg test: positive , and Thomas test: positive  all (+) on L  FUNCTIONAL TESTS:  5 times sit to stand: 21.25 sec SLS: 2 sec on R, 1 sec on L   GAIT: Distance walked: x50' Assistive device utilized: None Level of assistance: Complete Independence Comments: Decreased heel strike bilaterally (when cued to do so, pt states it increases R low  back pain), antalgic, L LE slightly externally rotated, wide BOS   TODAY'S TREATMENT:                                                                                                                              DATE:  04/11/23 TherEx Single knee to ches 3x10 sec bil Piriformis stretch supine 3x15 sec bil Bridges x 10 reps; 5 sec hold  Standing calf stretch 3x15 sec bil Standing hip abduction x10 reps bil Standing hip extension x10 reps bil Sit to/from stand 2 x 5 reps; no UE support  Manual STM with compression to Rt glutes; skilled palpation and monitoring of soft tissue during DN Trigger Point Dry-Needling  Treatment instructions: Expect mild to moderate muscle soreness. S/S of pneumothorax if dry needled over a lung field, and to seek immediate medical attention should they occur. Patient verbalized understanding of these instructions and education.  Patient Consent Given: Yes Education handout provided: Yes Muscles treated: Rt glute min Electrical stimulation performed: No Parameters: N/A Treatment response/outcome: twitch responses noted; reports decreased pain following  04/05/23 Supine  Butterfly stretch 2x 30 sec LTR 5x 10 sec ITB stretch with strap 2x 30 sec Piriformis stretch x30 sec Bridge with green TB around knees 2x10 Thomas stretch x30 sec Quad set + glute set 10x3 sec Sidelying  Clamshell green TB 2x10 Standing  Isometric gluteus medius at wall 2 x5x 3"  Donkey kick 2x10  Staggered stance glute set with overhead reach 2x10 Manual therapy: STM & TPR L quad and hip flexor   04/02/23 TherEx  Nustep L4x6 minutes BLEs only  Supine butterfly stretch 10x5 second holds  Supine figure 4 stretch 3x30 seconds B  Lumbar rotation stretch 6x5 seconds B  Supine HS stretches with strap 2x30 seconds B  Bridges x10 Standing hip extensions x12 B Standing hip ABD x12 B Standing hip hikes x10 B Standing 3D hip excursions x10 all directions    03/28/2023 Supine  IT Band stretch 4 x 20 seconds (with strap) bilateral Supine butterfly groin stretch 4 x 20 seconds Supine figure 4 stretch (left only) with push 4 x 20 seconds Isometric gluteus medius at wall 2 sets of 5 for 3 seconds Seated hip flexors stretch 4 x 20 seconds bilateral Standing hip extension 10 x 3 seconds   03/26/23 See HEP below    PATIENT EDUCATION:  Education details: Exam findings, POC, initial HEP Person educated: Patient Education method: Explanation, Demonstration, and Handouts Education comprehension: verbalized understanding, returned demonstration, and needs further education  HOME EXERCISE PROGRAM: Access Code: [email or web address] URL: [email or web address] Date: 03/26/2023 Prepared by: Vernon Prey April Kirstie Peri  Exercises - Isometric Gluteus Medius at Wall  - 1 x daily - 7 x weekly - 1 sets - 10 reps - 3 sec hold - Supine Butterfly Groin Stretch  - 1 x daily - 7 x weekly - 2 sets - 30 sec hold - Seated Hip Flexor Stretch  - 1 x daily - 7 x weekly - 2 sets - 30 sec hold - Standing Hip Extension with Counter Support  (glute iso with tap foot back) - 1 x daily - 7 x weekly - 2 sets - 10 reps - 3 sec hold  ASSESSMENT:  CLINICAL IMPRESSION: Trial of DN today to see if this helps with symptoms; pt reporting slightly decreased pain following session.  Will continue to benefit from PT to maximize function.  OBJECTIVE IMPAIRMENTS: Abnormal gait, decreased activity tolerance, decreased balance, decreased endurance, decreased mobility, difficulty walking, decreased ROM, decreased strength, hypomobility, increased fascial restrictions, increased muscle spasms, impaired flexibility, improper body mechanics, postural dysfunction, and pain.     GOALS: Goals reviewed with patient? Yes  SHORT TERM GOALS: Target date: 04/09/2023  Pt will be ind with initial HEP Baseline: Goal status: On Going 03/28/2023  2.  Pt will have improved hip extension in prone to at least 3/5  to demo decreasing hip  flexor tightness and increasing glute strength for single leg stability Baseline:  Goal status: INITIAL  3.  Pt will be able to perform SLS for at least 5 sec to demo increasing stability and balance for bowling Baseline:  Goal status: INITIAL   LONG TERM GOALS: Target date: 04/23/2023   Pt will be ind with management of her HEP Baseline:  Goal status: INITIAL  2.  Pt will be able to amb x 1000' for improved community mobility with </=3/10 hip pain per pt's goals Baseline:  Goal status: INITIAL  3.  Pt will be able to perform 5x STS in </=15 sec to demo increasing functional LE strength Baseline:  Goal status: INITIAL  4.  Pt will have increased FOTO to >/=50 Baseline:  Goal status: INITIAL    PLAN:  PT FREQUENCY: 2x/week  PT DURATION: 4 weeks per Dr. Diamantina Providence referral  PLANNED INTERVENTIONS: [id number]- PT Re-evaluation, 97110-Therapeutic exercises, 97530- Therapeutic activity, [id number]- Neuromuscular re-education, 97535- Self Care, [id number]- Manual therapy, [id number]- Gait training, [id number]- Aquatic Therapy, [id number]- Electrical stimulation (unattended), [phone]- Ionotophoresis 4mg /ml Dexamethasone, Balance training, Taping, Dry Needling, Joint mobilization, Cryotherapy, and Moist heat  PLAN FOR NEXT SESSION: assess response to DN, continue PRN. Continue stretching and strengthening hips. Work on leg stabilization/balance and improving weight shifting.  Review current HEP PRN.      Moshe Cipro, PT, DPT 04/11/23 3:11 PM

## 2023-04-13 ENCOUNTER — Encounter: Payer: Self-pay | Admitting: Rehabilitative and Restorative Service Providers"

## 2023-04-13 ENCOUNTER — Ambulatory Visit (INDEPENDENT_AMBULATORY_CARE_PROVIDER_SITE_OTHER): Payer: Medicare PPO | Admitting: Rehabilitative and Restorative Service Providers"

## 2023-04-13 DIAGNOSIS — R262 Difficulty in walking, not elsewhere classified: Secondary | ICD-10-CM

## 2023-04-13 DIAGNOSIS — M79604 Pain in right leg: Secondary | ICD-10-CM

## 2023-04-13 DIAGNOSIS — R2681 Unsteadiness on feet: Secondary | ICD-10-CM

## 2023-04-13 DIAGNOSIS — M6281 Muscle weakness (generalized): Secondary | ICD-10-CM | POA: Diagnosis not present

## 2023-04-13 NOTE — Therapy (Signed)
OUTPATIENT PHYSICAL THERAPY LOWER EXTREMITY TREATMENT   Patient Name: Barbara Patton MRN: [id number] DOB:August 24, 1940, 82 y.o., female Today's Date: 04/13/2023  END OF SESSION:  PT End of Session - 04/13/23 1520     Visit Number 6    Number of Visits 8    Date for PT Re-Evaluation 04/23/23    Authorization Type [employer]    PT Start Time 1430    PT Stop Time 1512    PT Time Calculation (min) 42 min    Activity Tolerance Patient tolerated treatment well;No increased pain;Patient limited by fatigue    Behavior During Therapy [facility] for tasks assessed/performed              Past Medical History:  Diagnosis Date   Glaucoma    High cholesterol    Hypertension    Lupus    Thyroid disease    Past Surgical History:  Procedure Laterality Date   ABDOMINAL HYSTERECTOMY     CATARACT EXTRACTION, BILATERAL     TOTAL KNEE ARTHROPLASTY Bilateral    TUBAL LIGATION     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   Menopausal symptom 10/20/2021   Overweight 10/20/2021   History of colonic polyps 10/20/2021   Urinary incontinence 10/20/2021   Globus pharyngeus 05/23/2021   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   Referred otalgia of left ear 08/23/2015   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   Allergic rhinitis 07/26/2015   Anxiety 07/26/2015   Benign essential hypertension 07/26/2015   Dysfunction of left eustachian tube 07/26/2015   Esophageal reflux 07/26/2015   Fibromyalgia 07/26/2015   Pure hypercholesterolemia 07/26/2015   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 05/03/2014   Pes anserinus bursitis of left knee 12/04/2013   Trochanteric bursitis of left hip 08/07/2013   Leg swelling 12/06/2011   Glaucoma 11/02/2011   HLD  (hyperlipidemia) 11/02/2011   HTN (hypertension) 11/02/2011   Hypothyroid 11/02/2011   OA (osteoarthritis) 11/02/2011   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  She goes to the gym for "classes" frequently.   PERTINENT HISTORY: No surgeries or injuries; L & R TKA, fibromyalgia  PAIN:  Are you having pain? Yes: NPRS scale: Rt hip to the knee 0-5/10, Lt lateral thigh 0-5/10 and back 0-5/10 on a 10/10 Pain location: R hip and bilateral lateral thighs, back  Pain description: "just a normal hurt its not burning like it was"  Aggravating factors: Sit to stand after prolonged postures Relieving factors: Rest , rubbing with CBD, exercises  PRECAUTIONS: None  RED FLAGS: None   WEIGHT BEARING RESTRICTIONS: No  FALLS:  Has patient fallen in last 6 months? Yes. 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  DIAGNOSTIC FINDINGS: Lumbar MRI 10/19/22 IMPRESSION: 1. Acute/subacute mild superior  endplate compression fractures at L3 and L4 with associated bone marrow edema, but no osseous retropulsion. 2. 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 3. Borderline spinal stenosis at L4-5 secondary to facet hypertrophy and a grade 1 anterolisthesis. 4. Distended urinary bladder with trabeculation, suggesting chronic bladder outlet obstruction.  PATIENT SURVEYS:  FOTO 45; predicted 50  COGNITION: Overall cognitive status: Within functional limits for tasks assessed     SENSATION: WFL  MUSCLE LENGTH: Thomas test: Right 5 deg; Left -15 deg  POSTURE: R lateral shift, truncal lean to R, increased weight shift to R LE, anterior pelvic tilt  PALPATION: TTP bilat glute med/max, piriformis  LOWER EXTREMITY ROM: see thomas test and hamstring test   Active ROM Left/Right 03/28/2023 Left/Right 04/13/2023  Hip flexion 80/90 100/95  Hip extension    Hip abduction    Hip adduction    Hip internal rotation -1/-3 2/-2  Hip external rotation 25/44 31/38  Knee flexion    Knee extension    Ankle dorsiflexion    Ankle plantarflexion    Ankle inversion    Ankle eversion    Hamstrings flexibility 45/50 45/50  Lumbar extension     (Blank rows = not tested)  LOWER EXTREMITY MMT:  MMT Right eval Left eval  Hip flexion 4 3+  Hip extension 3- 2+  Hip abduction 3- 3-  Hip adduction    Hip internal rotation    Hip external rotation    Knee flexion 5 4  Knee extension 5 5  Ankle dorsiflexion    Ankle plantarflexion    Ankle inversion    Ankle eversion     (Blank rows = not tested)  LOWER EXTREMITY SPECIAL TESTS:  03/28/2023: Positive Ober's bilateral  Evaluation: Hip special tests: Luisa Hart (FABER) test: positive , Trendelenburg test: positive , and Thomas test: positive  all (+) on L  FUNCTIONAL TESTS:  5 times sit to stand: 21.25 sec SLS: 2 sec on R, 1 sec on L   GAIT: Distance walked: x50' Assistive device  utilized: None Level of assistance: Complete Independence Comments: Decreased heel strike bilaterally (when cued to do so, pt states it increases R low back pain), antalgic, L LE slightly externally rotated, wide BOS   TODAY'S TREATMENT:                                                                                                                              DATE:  04/13/2023 Lumbar extension AROM 10 x 3  seconds Hip hike at counter top 10 x 3 seconds Bridging 2 sets of 10 x 5 seconds Figure 4 (push) stretch 4 x 20 seconds Shoulder blade pinches 10 x 5 seconds  Functional Activities: Reviewed spine MRI with spine model, took objective measures and discussed, discussed the importance of frequent change of position and avoiding prolonged and slouched postures, encouraged her to focus on 4 specific HEP activities (shoulder blade pinch, lumbar extension AROM, bridging and hip hiking)   04/11/23 TherEx Single knee to chest 3x10 sec bil Piriformis stretch supine 3x15 sec bil Bridges x 10 reps; 5 sec hold Standing calf stretch 3x15 sec bil Standing hip abduction x10 reps bil Standing hip extension x10 reps bil Sit to/from stand 2 x 5 reps; no UE support  Manual STM with compression to Rt glutes; skilled palpation and monitoring of soft tissue during DN Trigger Point Dry-Needling  Treatment instructions: Expect mild to moderate muscle soreness. S/S of pneumothorax if dry needled over a lung field, and to seek immediate medical attention should they occur. Patient verbalized understanding of these instructions and education.  Patient Consent Given: Yes Education handout provided: Yes Muscles treated: Rt glute min Electrical stimulation performed: No Parameters: N/A Treatment response/outcome: twitch responses noted; reports decreased pain following   04/05/23 Supine  Butterfly stretch 2x 30 sec LTR 5x 10 sec ITB stretch with strap 2x 30 sec Piriformis stretch x30 sec Bridge with  green TB around knees 2x10 Thomas stretch x30 sec Quad set + glute set 10x3 sec Sidelying  Clamshell green TB 2x10 Standing  Isometric gluteus medius at wall 2 x5x 3"  Donkey kick 2x10  Staggered stance glute set with overhead reach 2x10 Manual therapy: STM & TPR L quad and hip flexor    PATIENT EDUCATION:  Education details: Exam findings, POC, initial HEP Person educated: Patient Education method: Explanation, Demonstration, and Handouts Education comprehension: verbalized understanding, returned demonstration, and needs further education  HOME EXERCISE PROGRAM: Access Code: [email or web address] URL: [email or web address] Date: 04/13/2023 Prepared by: Pauletta Browns  Exercises - Isometric Gluteus Medius at Wall  - 2-3 x daily - 7 x weekly - 1 sets - 10 reps - 3 sec hold - Supine Butterfly Groin Stretch  - 2-3 x daily - 7 x weekly - 1 sets - 4-5 reps - 20-30 sec hold - Seated Hip Flexor Stretch  - 2-3 x daily - 7 x weekly - 1 sets - 4-5 reps - 20-30 sec hold - Standing Hip Extension with Counter Support  - 2-3 x daily - 7 x weekly - 1 sets - 10 reps - 3 sec hold - Supine Figure 4 Piriformis Stretch  - 2-3 x daily - 7 x weekly - 1 sets - 4-5 reps - 20 seconds hold - Supine ITB Stretch with Strap  - 2-3 x daily - 7 x weekly - 1 sets - 4-5 reps - 20 seconds hold - Standing Lumbar Extension at Wall - Forearms  - 5 x daily - 7 x weekly - 1 sets - 5 reps - 3 seconds hold - Yoga Bridge  - 2 x daily - 7 x weekly - 2 sets - 10 reps - 3 seconds hold - Standing Hip Hiking  - 3-5 x daily - 7 x weekly - 1 sets - 10 reps - 3 seconds hold - Standing Scapular Retraction  - 5 x daily - 7 x weekly - 1 sets - 5 reps - 5 second hold  ASSESSMENT:  CLINICAL IMPRESSION: Charlcie and  I did a brief active range of motion and flexibility reassessment today.  She is moving better than she was a few weeks ago, although her core strength and endurance with standing and weight-bearing function remains  poor.  We reviewed some spine anatomy and her old MRI to show how this is consistent with what we are seeing on imaging.  We progressed some core strengthening activities today and I encouraged Grisel to avoid prolonged sitting as she reports she is spending most of her day in this position.  We discussed the importance of frequent changes of position and avoiding slouched and flexed postures.  Rayfield Citizen will benefit from continued supervised physical therapy to address core and general strengthening to allow her to improve endurance with standing and other ADL activities.  OBJECTIVE IMPAIRMENTS: Abnormal gait, decreased activity tolerance, decreased balance, decreased endurance, decreased mobility, difficulty walking, decreased ROM, decreased strength, hypomobility, increased fascial restrictions, increased muscle spasms, impaired flexibility, improper body mechanics, postural dysfunction, and pain.     GOALS: Goals reviewed with patient? Yes  SHORT TERM GOALS: Target date: 04/09/2023  Pt will be ind with initial HEP Baseline: Goal status: On Going 04/13/2023  2.  Pt will have improved hip extension in prone to at least 3/5 to demo decreasing hip flexor tightness and increasing glute strength for single leg stability Baseline:  Goal status: INITIAL  3.  Pt will be able to perform SLS for at least 5 sec to demo increasing stability and balance for bowling Baseline:  Goal status: INITIAL   LONG TERM GOALS: Target date: 04/23/2023   Pt will be ind with management of her HEP Baseline:  Goal status: INITIAL  2.  Pt will be able to amb x 1000' for improved community mobility with </=3/10 hip pain per pt's goals Baseline:  Goal status: INITIAL  3.  Pt will be able to perform 5x STS in </=15 sec to demo increasing functional LE strength Baseline:  Goal status: INITIAL  4.  Pt will have increased FOTO to >/=50 Baseline:  Goal status: INITIAL    PLAN:  PT FREQUENCY: 2x/week  PT  DURATION: 4 weeks per Dr. Diamantina Providence referral  PLANNED INTERVENTIONS: [phone]- PT Re-evaluation, 97110-Therapeutic exercises, 97530- Therapeutic activity, [id number]- Neuromuscular re-education, 97535- Self Care, 21308- Manual therapy, [id number]- Gait training, [id number]- Aquatic Therapy, 97014- Electrical stimulation (unattended), 97033- Ionotophoresis 4mg /ml Dexamethasone, Balance training, Taping, Dry Needling, Joint mobilization, Cryotherapy, and Moist heat  PLAN FOR NEXT SESSION: Ask if she wants additional DN, continue PRN.  Continue appropriate core and leg strength progressions.  Progress current HEP PRN.      Cherlyn Cushing, PT, MPT 04/13/23 3:28 PM

## 2023-04-17 ENCOUNTER — Encounter: Payer: Medicare PPO | Admitting: Physical Therapy

## 2023-04-17 DIAGNOSIS — R338 Other retention of urine: Secondary | ICD-10-CM | POA: Diagnosis not present

## 2023-04-17 DIAGNOSIS — N139 Obstructive and reflux uropathy, unspecified: Secondary | ICD-10-CM | POA: Diagnosis not present

## 2023-04-20 ENCOUNTER — Ambulatory Visit (INDEPENDENT_AMBULATORY_CARE_PROVIDER_SITE_OTHER): Payer: Medicare PPO | Admitting: Rehabilitative and Restorative Service Providers"

## 2023-04-20 ENCOUNTER — Encounter: Payer: Self-pay | Admitting: Rehabilitative and Restorative Service Providers"

## 2023-04-20 DIAGNOSIS — M6281 Muscle weakness (generalized): Secondary | ICD-10-CM

## 2023-04-20 DIAGNOSIS — R262 Difficulty in walking, not elsewhere classified: Secondary | ICD-10-CM

## 2023-04-20 DIAGNOSIS — M79604 Pain in right leg: Secondary | ICD-10-CM

## 2023-04-20 DIAGNOSIS — R2681 Unsteadiness on feet: Secondary | ICD-10-CM

## 2023-04-20 NOTE — Therapy (Signed)
OUTPATIENT PHYSICAL THERAPY LOWER EXTREMITY TREATMENT   Patient Name: Barbara Patton MRN: [id number] DOB:[birth year], 82 y.o., female Today's Date: 04/20/2023  END OF SESSION:  PT End of Session - 04/20/23 1107     Visit Number 7    Number of Visits 8    Date for PT Re-Evaluation 04/23/23    Authorization Type [employer]    PT Start Time 1106    PT Stop Time 1151    PT Time Calculation (min) 45 min    Activity Tolerance Patient tolerated treatment well;No increased pain;Patient limited by fatigue    Behavior During Therapy [facility] for tasks assessed/performed               Past Medical History:  Diagnosis Date   Glaucoma    High cholesterol    Hypertension    Lupus    Thyroid disease    Past Surgical History:  Procedure Laterality Date   ABDOMINAL HYSTERECTOMY     CATARACT EXTRACTION, BILATERAL     TOTAL KNEE ARTHROPLASTY Bilateral    TUBAL LIGATION     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   Menopausal symptom 10/20/2021   Overweight 10/20/2021   History of colonic polyps 10/20/2021   Urinary incontinence 10/20/2021   Globus pharyngeus 05/23/2021   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   Referred otalgia of left ear 08/23/2015   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   Allergic rhinitis 07/26/2015   Anxiety 07/26/2015   Benign essential hypertension 07/26/2015   Dysfunction of left eustachian tube 07/26/2015   Esophageal reflux 07/26/2015   Fibromyalgia 07/26/2015   Pure hypercholesterolemia 07/26/2015   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 05/03/2014   Pes anserinus bursitis of left knee 12/04/2013   Trochanteric bursitis of left hip 08/07/2013   Leg swelling 12/06/2011   Glaucoma 11/02/2011   HLD  (hyperlipidemia) 11/02/2011   HTN (hypertension) 11/02/2011   Hypothyroid 11/02/2011   OA (osteoarthritis) 11/02/2011   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) 03/16/2011   Presence of artificial knee joint 03/16/2011    PCP: Renford Dills, MD  REFERRING PROVIDER: Cammy Copa, MD  REFERRING DIAG: M70.61 (ICD-10-CM) - Greater trochanteric bursitis, right M70.62 (ICD-10-CM) - Greater trochanteric bursitis, left  THERAPY DIAG:  Pain in right leg  Muscle weakness (generalized)  Difficulty in walking, not elsewhere classified  Unsteadiness on feet  Rationale for Evaluation and Treatment: Rehabilitation  ONSET DATE: ~5 years  SUBJECTIVE:   SUBJECTIVE STATEMENT: [name] reports improved HEP compliance at "3-4 x a week."  Better than last week, but shy of the 2-3 x a day recommended and needed to meet long-term goals.  PERTINENT HISTORY: No surgeries or injuries; L & R TKA, fibromyalgia  PAIN:  Are you having pain? Yes: NPRS scale: Rt hip to the knee 0-5/10, Lt lateral thigh 0-5/10 and back 0-5/10 on a 10/10 Pain location: R hip and bilateral lateral thighs, back  Pain description: "just a normal hurt its not burning like it was"  Aggravating factors: Sit to stand after prolonged postures Relieving factors: Rest , rubbing with CBD, exercises  PRECAUTIONS: None  RED FLAGS: None   WEIGHT BEARING RESTRICTIONS: No  FALLS:  Has patient fallen in last 6 months? Yes. 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  DIAGNOSTIC FINDINGS: Lumbar  MRI 10/19/22 IMPRESSION: 1. Acute/subacute mild superior endplate compression fractures at L3 and L4 with associated bone marrow edema, but no osseous retropulsion. 2. 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 3. Borderline spinal stenosis at L4-5 secondary to facet hypertrophy and a grade 1 anterolisthesis. 4. Distended urinary bladder with trabeculation, suggesting chronic bladder outlet obstruction.  PATIENT SURVEYS:  FOTO 45; predicted 50  COGNITION: Overall cognitive status: Within functional limits for tasks assessed     SENSATION: WFL  MUSCLE LENGTH: Thomas test: Right 5 deg; Left -15 deg  POSTURE: R lateral shift, truncal lean to R, increased weight shift to R LE, anterior pelvic tilt  PALPATION: TTP bilat glute med/max, piriformis  LOWER EXTREMITY ROM: see thomas test and hamstring test   Active ROM Left/Right 03/28/2023 Left/Right 04/13/2023  Hip flexion 80/90 100/95  Hip extension    Hip abduction    Hip adduction    Hip internal rotation -1/-3 2/-2  Hip external rotation 25/44 31/38  Knee flexion    Knee extension    Ankle dorsiflexion    Ankle plantarflexion    Ankle inversion    Ankle eversion    Hamstrings flexibility 45/50 45/50  Lumbar extension     (Blank rows = not tested)  LOWER EXTREMITY MMT:  MMT Right eval Left eval  Hip flexion 4 3+  Hip extension 3- 2+  Hip abduction 3- 3-  Hip adduction    Hip internal rotation    Hip external rotation    Knee flexion 5 4  Knee extension 5 5  Ankle dorsiflexion    Ankle plantarflexion    Ankle inversion    Ankle eversion     (Blank rows = not tested)  LOWER EXTREMITY SPECIAL TESTS:  03/28/2023: Positive Ober's bilateral  Evaluation: Hip special tests: Luisa Hart (FABER) test: positive , Trendelenburg test: positive , and Thomas test: positive  all (+) on L  FUNCTIONAL TESTS:  5 times sit to stand: 21.25 sec SLS: 2 sec on R, 1 sec on  L   GAIT: Distance walked: x50' Assistive device utilized: None Level of assistance: Complete Independence Comments: Decreased heel strike bilaterally (when cued to do so, pt states it increases R low back pain), antalgic, L LE slightly externally rotated, wide BOS   TODAY'S TREATMENT:  DATE:  04/20/2023 Lumbar extension AROM 10 x 3 seconds Hip hike at counter top 3 sets of 10 x 3 seconds Bridging 2 sets of 10 x 5 seconds Figure 4 (push) stretch 4 x 20 seconds Shoulder blade pinches 10 x 5 seconds  Functional Activities: Reviewed spine anatomy with spine model to look at reasons for increased peripheral symptoms with prolonged sitting and flexion.  Reviewed the importance of frequent change of position and avoiding prolonged and slouched postures.  Reinforced the main 4 specific HEP activities (shoulder blade pinch, lumbar extension AROM, bridging and hip hiking).  Looked at lumbar roll, log roll and disc pressures in various positions chart.   04/13/2023 Lumbar extension AROM 10 x 3 seconds Hip hike at counter top 10 x 3 seconds Bridging 2 sets of 10 x 5 seconds Figure 4 (push) stretch 4 x 20 seconds Shoulder blade pinches 10 x 5 seconds  Functional Activities: Reviewed spine MRI with spine model, took objective measures and discussed, discussed the importance of frequent change of position and avoiding prolonged and slouched postures, encouraged her to focus on 4 specific HEP activities (shoulder blade pinch, lumbar extension AROM, bridging and hip hiking)   04/11/23 TherEx Single knee to chest 3x10 sec bil Piriformis stretch supine 3x15 sec bil Bridges x 10 reps; 5 sec hold Standing calf stretch 3x15 sec bil Standing hip abduction x10 reps bil Standing hip extension x10 reps bil Sit to/from stand 2 x 5 reps; no UE support  Manual STM with compression  to Rt glutes; skilled palpation and monitoring of soft tissue during DN Trigger Point Dry-Needling  Treatment instructions: Expect mild to moderate muscle soreness. S/S of pneumothorax if dry needled over a lung field, and to seek immediate medical attention should they occur. Patient verbalized understanding of these instructions and education.  Patient Consent Given: Yes Education handout provided: Yes Muscles treated: Rt glute min Electrical stimulation performed: No Parameters: N/A Treatment response/outcome: twitch responses noted; reports decreased pain following   PATIENT EDUCATION:  Education details: Exam findings, POC, initial HEP Person educated: Patient Education method: Explanation, Demonstration, and Handouts Education comprehension: verbalized understanding, returned demonstration, and needs further education  HOME EXERCISE PROGRAM: Access Code: [email or web address] URL: [email or web address] Date: 04/13/2023 Prepared by: Pauletta Browns  Exercises - Isometric Gluteus Medius at Wall  - 2-3 x daily - 7 x weekly - 1 sets - 10 reps - 3 sec hold - Supine Butterfly Groin Stretch  - 2-3 x daily - 7 x weekly - 1 sets - 4-5 reps - 20-30 sec hold - Seated Hip Flexor Stretch  - 2-3 x daily - 7 x weekly - 1 sets - 4-5 reps - 20-30 sec hold - Standing Hip Extension with Counter Support  - 2-3 x daily - 7 x weekly - 1 sets - 10 reps - 3 sec hold - Supine Figure 4 Piriformis Stretch  - 2-3 x daily - 7 x weekly - 1 sets - 4-5 reps - 20 seconds hold - Supine ITB Stretch with Strap  - 2-3 x daily - 7 x weekly - 1 sets - 4-5 reps - 20 seconds hold - Standing Lumbar Extension at Wall - Forearms  - 5 x daily - 7 x weekly - 1 sets - 5 reps - 3 seconds hold - Yoga Bridge  - 2 x daily - 7 x weekly - 2 sets - 10 reps - 3 seconds hold - Standing Hip Hiking  - 3-5 x daily - 7 x weekly -  1 sets - 10 reps - 3 seconds hold - Standing Scapular Retraction  - 5 x daily - 7 x weekly - 1 sets - 5 reps  - 5 second hold  ASSESSMENT:  CLINICAL IMPRESSION: Lamyah hs a simple, brief home exercise program that is addressing her major area of concerns.  With consistent home exercise program compliance, her pain should decrease, her function and endurance should improve.  Her current home exercise program compliance is once a day on the days that she does not attend her exercise classes.  Although improved from last week, this is still not at a level that she will make rapid progress towards long-term goals.  Rayfield Citizen and I discussed possibly doing a reassessment at her next visit and I encouraged her to get her home exercises in multiple times per day along with careful attention to posture and body mechanics.  I believe her prognosis to meet long-term goals remains good with adequate compliance.   OBJECTIVE IMPAIRMENTS: Abnormal gait, decreased activity tolerance, decreased balance, decreased endurance, decreased mobility, difficulty walking, decreased ROM, decreased strength, hypomobility, increased fascial restrictions, increased muscle spasms, impaired flexibility, improper body mechanics, postural dysfunction, and pain.     GOALS: Goals reviewed with patient? Yes  SHORT TERM GOALS: Target date: 04/09/2023  Pt will be ind with initial HEP Baseline: Goal status: On Going 04/20/2023  2.  Pt will have improved hip extension in prone to at least 3/5 to demo decreasing hip flexor tightness and increasing glute strength for single leg stability Baseline:  Goal status: INITIAL  3.  Pt will be able to perform SLS for at least 5 sec to demo increasing stability and balance for bowling Baseline:  Goal status: INITIAL   LONG TERM GOALS: Target date: 04/23/2023   Pt will be ind with management of her HEP Baseline:  Goal status: On Going 04/20/2023  2.  Pt will be able to amb x 1000' for improved community mobility with </=3/10 hip pain per pt's goals Baseline:  Goal status: Partially met  04/20/2023  3.  Pt will be able to perform 5x STS in </=15 sec to demo increasing functional LE strength Baseline:  Goal status: INITIAL  4.  Pt will have increased FOTO to >/=50 Baseline:  Goal status: INITIAL    PLAN:  PT FREQUENCY: 1 additional visit on current certification  PT DURATION: 1 visit left per Dr. Diamantina Providence referral  PLANNED INTERVENTIONS: [phone]- PT Re-evaluation, 97110-Therapeutic exercises, 97530- Therapeutic activity, [id number]- Neuromuscular re-education, 97535- Self Care, [id number]- Manual therapy, [id number]- Gait training, [id number]- Aquatic Therapy, [id number]- Electrical stimulation (unattended), 97033- Ionotophoresis 4mg /ml Dexamethasone, Balance training, Taping, Dry Needling, Joint mobilization, Cryotherapy, and Moist heat  PLAN FOR NEXT SESSION: Ask if she wants additional DN, continue PRN.  Continue current appropriate core and leg strength progressions.  Progress current HEP PRN if needed.  She will need a FOTO and progress note for any visits beyond her eighth visit.    Cherlyn Cushing, PT, MPT 04/20/23 12:06 PM

## 2023-04-24 ENCOUNTER — Ambulatory Visit (INDEPENDENT_AMBULATORY_CARE_PROVIDER_SITE_OTHER): Payer: Medicare PPO | Admitting: Podiatry

## 2023-04-24 ENCOUNTER — Encounter: Payer: Self-pay | Admitting: Podiatry

## 2023-04-24 ENCOUNTER — Ambulatory Visit (INDEPENDENT_AMBULATORY_CARE_PROVIDER_SITE_OTHER): Payer: Medicare PPO

## 2023-04-24 DIAGNOSIS — S90222A Contusion of left lesser toe(s) with damage to nail, initial encounter: Secondary | ICD-10-CM

## 2023-04-24 DIAGNOSIS — L84 Corns and callosities: Secondary | ICD-10-CM

## 2023-04-24 DIAGNOSIS — M2041 Other hammer toe(s) (acquired), right foot: Secondary | ICD-10-CM

## 2023-04-24 DIAGNOSIS — M2042 Other hammer toe(s) (acquired), left foot: Secondary | ICD-10-CM

## 2023-04-24 DIAGNOSIS — M21612 Bunion of left foot: Secondary | ICD-10-CM

## 2023-04-24 NOTE — Progress Notes (Signed)
  Subjective:  Patient ID: Mardee Postin, female    DOB: 1940-07-10,   MRN: [id number]  No chief complaint on file.   82 y.o. female presents for concern of left foot pain. Relates mostly the left second toe is painful when she walks. She also has concern for bunion deformity and if this has anything to do with her pain. Relates in certain shoes she will get pain from her bunion.   . Denies any other pedal complaints. Denies n/v/f/c.   Past Medical History:  Diagnosis Date   Glaucoma    High cholesterol    Hypertension    Lupus    Thyroid disease     Objective:  Physical Exam: Vascular: DP/PT pulses 2/4 bilateral. CFT <3 seconds. Normal hair growth on digits. No edema.  Skin. No lacerations or abrasions bilateral feet. Hyperkeratotic tissue noted to distal left second digit.  Musculoskeletal: MMT 5/5 bilateral lower extremities in DF, PF, Inversion and Eversion. Deceased ROM in DF of ankle joint. 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  Neurological: Sensation intact to light touch.   Assessment:   1. Bunion, left   2. Hammertoes of both feet   3. Callus of foot      Plan:  Patient was evaluated and treated and all questions answered. -X-rays reviewed. Hammered second digit and HAV deformity noted.  -Educated on hammertoes and treatment options Discussed callus formation -Hyperkeratotic tissue debrided without incident as courtesy.  -Discussed padding including toe caps and crest pads.  -Discussed need for potential surgery if pain does not improved.  -Patient to follow-up as needed. Discussed calling if any changes or increased pain.    Louann Sjogren, DPM

## 2023-04-30 ENCOUNTER — Telehealth: Payer: Self-pay | Admitting: Physical Therapy

## 2023-04-30 ENCOUNTER — Encounter: Payer: Medicare PPO | Admitting: Physical Therapy

## 2023-04-30 NOTE — Telephone Encounter (Signed)
Pt did not show for PT appointment today. They were contacted by phone number in EP[employer] system with no answer and mailbox is full so unable to leave message.  Ivery Quale, PT, DPT 04/30/23 10:34 AM

## 2023-05-01 ENCOUNTER — Encounter: Payer: Medicare PPO | Admitting: Rehabilitative and Restorative Service Providers"

## 2023-05-08 ENCOUNTER — Other Ambulatory Visit: Payer: Self-pay | Admitting: Neurology

## 2023-05-08 DIAGNOSIS — R251 Tremor, unspecified: Secondary | ICD-10-CM

## 2023-05-14 ENCOUNTER — Ambulatory Visit (INDEPENDENT_AMBULATORY_CARE_PROVIDER_SITE_OTHER): Payer: Medicare PPO | Admitting: Physical Therapy

## 2023-05-14 ENCOUNTER — Encounter: Payer: Self-pay | Admitting: Physical Therapy

## 2023-05-14 DIAGNOSIS — R2681 Unsteadiness on feet: Secondary | ICD-10-CM

## 2023-05-14 DIAGNOSIS — M5459 Other low back pain: Secondary | ICD-10-CM | POA: Diagnosis not present

## 2023-05-14 DIAGNOSIS — R262 Difficulty in walking, not elsewhere classified: Secondary | ICD-10-CM

## 2023-05-14 DIAGNOSIS — M79604 Pain in right leg: Secondary | ICD-10-CM | POA: Diagnosis not present

## 2023-05-14 DIAGNOSIS — M6281 Muscle weakness (generalized): Secondary | ICD-10-CM

## 2023-05-14 NOTE — Therapy (Signed)
OUTPATIENT PHYSICAL THERAPY LOWER EXTREMITY TREATMENT/   Patient Name: Barbara Patton MRN: [id number] DOB:27-Mar-1941, 82 y.o., female Today's Date: 05/14/2023   PHYSICAL THERAPY DISCHARGE SUMMARY  Visits from Start of Care: 8  Current functional level related to goals / functional outcomes: See below    Remaining deficits: See below    Education / Equipment: See below    Patient agrees to discharge. Patient goals were not met. Patient is being discharged due to lack of progress.   END OF SESSION:  PT End of Session - 05/14/23 0938     Visit Number 8    Number of Visits 8    Authorization Type [employer]    PT Start Time [phone]    PT Stop Time 1015    PT Time Calculation (min) 42 min    Activity Tolerance Patient tolerated treatment well;No increased pain;Patient limited by fatigue    Behavior During Therapy [place] for tasks assessed/performed                Past Medical History:  Diagnosis Date   Glaucoma    High cholesterol    Hypertension    Lupus    Thyroid disease    Past Surgical History:  Procedure Laterality Date   ABDOMINAL HYSTERECTOMY     CATARACT EXTRACTION, BILATERAL     TOTAL KNEE ARTHROPLASTY Bilateral    TUBAL LIGATION     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   Menopausal symptom 10/20/2021   Overweight 10/20/2021   History of colonic polyps 10/20/2021   Urinary incontinence 10/20/2021   Globus pharyngeus 05/23/2021   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   Referred otalgia of left ear 08/23/2015   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   Allergic rhinitis 07/26/2015   Anxiety 07/26/2015   Benign essential hypertension 07/26/2015   Dysfunction of left eustachian tube 07/26/2015   Esophageal reflux 07/26/2015   Fibromyalgia 07/26/2015   Pure  hypercholesterolemia 07/26/2015   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 05/03/2014   Pes anserinus bursitis of left knee 12/04/2013   Trochanteric bursitis of left hip 08/07/2013   Leg swelling 12/06/2011   Glaucoma 11/02/2011   HLD (hyperlipidemia) 11/02/2011   HTN (hypertension) 11/02/2011   Hypothyroid 11/02/2011   OA (osteoarthritis) 11/02/2011   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) 03/16/2011   Presence of artificial knee joint 03/16/2011    PCP: Renford Dills, MD  REFERRING PROVIDER: Cammy Copa, MD  REFERRING DIAG: M70.61 (ICD-10-CM) - Greater trochanteric bursitis, right M70.62 (ICD-10-CM) - Greater trochanteric bursitis, left  THERAPY DIAG:  Pain in right leg - Plan: PT plan of care cert/re-cert  Muscle weakness (generalized) - Plan: PT plan of care cert/re-cert  Difficulty in walking, not elsewhere classified - Plan: PT plan of care cert/re-cert  Unsteadiness on feet - Plan: PT plan of care cert/re-cert  Other low back pain - Plan: PT plan of care cert/re-cert  Rationale for Evaluation and Treatment: Rehabilitation  ONSET DATE: ~5 years  SUBJECTIVE:   SUBJECTIVE STATEMENT:  I can't say I'm better, still having issues with pain feeling like needs going down to the side of my leg. It will make you want to cry, I can just be sitting here and all of a sudden  it comes. Nothing new, I didn't really do HEP like I should have, had some personal things going on like car issues and trying to get stuff, lots going on.   PERTINENT HISTORY: No surgeries or injuries; L & R TKA, fibromyalgia  PAIN:  Are you having pain? Yes: NPRS scale: 5-6/10 Pain location: R hip down side of leg, back, knees  Pain description: needles come and go in side of leg, "I don't feel no pain right now" even ranking pain 5-6 just a couple minutes ago Aggravating factors: not really stated  Relieving  factors: Rest , rubbing with CBD, exercises  PRECAUTIONS: None  RED FLAGS: None   WEIGHT BEARING RESTRICTIONS: No  FALLS:  Has patient fallen in last 6 months? Yes. 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  DIAGNOSTIC FINDINGS: Lumbar MRI 10/19/22 IMPRESSION: 1. Acute/subacute mild superior endplate compression fractures at L3 and L4 with associated bone marrow edema, but no osseous retropulsion. 2. 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 3. Borderline spinal stenosis at L4-5 secondary to facet hypertrophy and a grade 1 anterolisthesis. 4. Distended urinary bladder with trabeculation, suggesting chronic bladder outlet obstruction.  PATIENT SURVEYS:  FOTO 45; predicted 50; 05/14/23 51  COGNITION: Overall cognitive status: Within functional limits for tasks assessed     SENSATION: WFL  MUSCLE LENGTH: Thomas test: Right 5 deg; Left -15 deg  POSTURE: R lateral shift, truncal lean to R, increased weight shift to R LE, anterior pelvic tilt  PALPATION: TTP bilat glute med/max, piriformis  LOWER EXTREMITY ROM: see thomas test and hamstring test   Active ROM Left/Right 03/28/2023 Left/Right 04/13/2023  Hip flexion 80/90 100/95  Hip extension    Hip abduction    Hip adduction    Hip internal rotation -1/-3 2/-2  Hip external rotation 25/44 31/38  Knee flexion    Knee extension    Ankle dorsiflexion    Ankle plantarflexion    Ankle inversion    Ankle eversion    Hamstrings flexibility 45/50  45/50  Lumbar extension     (Blank rows = not tested)  LOWER EXTREMITY MMT:  MMT Right eval Left eval Right 05/14/23 Left 05/14/23  Hip flexion 4 3+ 4- 3+  Hip extension 3- 2+ 2 2  Hip abduction 3- 3- 3 3-  Hip adduction      Hip internal rotation      Hip external rotation      Knee flexion 5 4 5 4   Knee extension 5 5 5  4+  Ankle dorsiflexion      Ankle plantarflexion      Ankle inversion      Ankle eversion       (Blank rows = not tested)  LOWER EXTREMITY SPECIAL TESTS:  03/28/2023: Positive Ober's bilateral  Evaluation: Hip special tests: Luisa Hart (FABER) test: positive , Trendelenburg test: positive , and Thomas test: positive  all (+) on L  FUNCTIONAL TESTS:  5 times sit to stand: 21.25 sec; 05/14/23 18 seconds no UEs   SLS: 2 sec on R, 1  sec on L; 05/14/23 R 2 seconds, L 1 second    GAIT: Distance walked: x50' Assistive device utilized: None Level of assistance: Complete Independence Comments: Decreased heel strike bilaterally (when cued to do so, pt states it increases R low back pain), antalgic, L LE slightly externally rotated, wide BOS   TODAY'S TREATMENT:                                                                                                                              DATE:   05/14/23  FOTO, objective measures as above, education towards goals and DC today/will need new MD referral to return/sessions limited by insurance due to limited progress with PT, encourage warm water exercise in pool to help with general stiffness/fibro pain, also recommended return to MD for next steps     TherEx  Figure 4 stretch 3x30 seconds B  Lumbar rotation stretch 5x10 seconds B  SKTC 3x10 seconds B     04/20/2023 Lumbar extension AROM 10 x 3 seconds Hip hike at counter top 3 sets of 10 x 3 seconds Bridging 2 sets of 10 x 5 seconds Figure 4 (push) stretch 4 x 20 seconds Shoulder blade pinches 10 x 5 seconds  Functional Activities: Reviewed spine  anatomy with spine model to look at reasons for increased peripheral symptoms with prolonged sitting and flexion.  Reviewed the importance of frequent change of position and avoiding prolonged and slouched postures.  Reinforced the main 4 specific HEP activities (shoulder blade pinch, lumbar extension AROM, bridging and hip hiking).  Looked at lumbar roll, log roll and disc pressures in various positions chart.   04/13/2023 Lumbar extension AROM 10 x 3 seconds Hip hike at counter top 10 x 3 seconds Bridging 2 sets of 10 x 5 seconds Figure 4 (push) stretch 4 x 20 seconds Shoulder blade pinches 10 x 5 seconds  Functional Activities: Reviewed spine MRI with spine model, took objective measures and discussed, discussed the importance of frequent change of position and avoiding prolonged and slouched postures, encouraged her to focus on 4 specific HEP activities (shoulder blade pinch, lumbar extension AROM, bridging and hip hiking)   04/11/23 TherEx Single knee to chest 3x10 sec bil Piriformis stretch supine 3x15 sec bil Bridges x 10 reps; 5 sec hold Standing calf stretch 3x15 sec bil Standing hip abduction x10 reps bil Standing hip extension x10 reps bil Sit to/from stand 2 x 5 reps; no UE support  Manual STM with compression to Rt glutes; skilled palpation and monitoring of soft tissue during DN Trigger Point Dry-Needling  Treatment instructions: Expect mild to moderate muscle soreness. S/S of pneumothorax if dry needled over a lung field, and to seek immediate medical attention should they occur. Patient verbalized understanding of these instructions and education.  Patient Consent Given: Yes Education handout provided: Yes Muscles treated: Rt glute min Electrical stimulation performed: No Parameters: N/A Treatment response/outcome: twitch responses noted; reports decreased pain following  PATIENT EDUCATION:  Education details: Exam findings, POC, initial HEP Person educated:  Patient Education method: Explanation, Demonstration, and Handouts Education comprehension: verbalized understanding, returned demonstration, and needs further education  HOME EXERCISE PROGRAM: Access Code: [email or web address] URL: [email or web address] Date: 04/13/2023 Prepared by: Pauletta Browns  Exercises - Isometric Gluteus Medius at Wall  - 2-3 x daily - 7 x weekly - 1 sets - 10 reps - 3 sec hold - Supine Butterfly Groin Stretch  - 2-3 x daily - 7 x weekly - 1 sets - 4-5 reps - 20-30 sec hold - Seated Hip Flexor Stretch  - 2-3 x daily - 7 x weekly - 1 sets - 4-5 reps - 20-30 sec hold - Standing Hip Extension with Counter Support  - 2-3 x daily - 7 x weekly - 1 sets - 10 reps - 3 sec hold - Supine Figure 4 Piriformis Stretch  - 2-3 x daily - 7 x weekly - 1 sets - 4-5 reps - 20 seconds hold - Supine ITB Stretch with Strap  - 2-3 x daily - 7 x weekly - 1 sets - 4-5 reps - 20 seconds hold - Standing Lumbar Extension at Wall - Forearms  - 5 x daily - 7 x weekly - 1 sets - 5 reps - 3 seconds hold - Yoga Bridge  - 2 x daily - 7 x weekly - 2 sets - 10 reps - 3 seconds hold - Standing Hip Hiking  - 3-5 x daily - 7 x weekly - 1 sets - 10 reps - 3 seconds hold - Standing Scapular Retraction  - 5 x daily - 7 x weekly - 1 sets - 5 reps - 5 second hold  ASSESSMENT:  CLINICAL IMPRESSION:  Focused session on FOTO and updating objective measures- she still has not been compliant with recommended HEP, and as noted above has lost ground on many measurements/lost progress towards goals. At this point we will go ahead and DC with recommendation for return to MD given that she has not made significant progress with skilled PT services. Thank you for the referral!    OBJECTIVE IMPAIRMENTS: Abnormal gait, decreased activity tolerance, decreased balance, decreased endurance, decreased mobility, difficulty walking, decreased ROM, decreased strength, hypomobility, increased fascial restrictions, increased  muscle spasms, impaired flexibility, improper body mechanics, postural dysfunction, and pain.     GOALS: Goals reviewed with patient? Yes  SHORT TERM GOALS: Target date: 04/09/2023  Pt will be ind with initial HEP Baseline: Goal status: NOT MET 05/14/23  2.  Pt will have improved hip extension in prone to at least 3/5 to demo decreasing hip flexor tightness and increasing glute strength for single leg stability Baseline:  Goal status: NOT MET 05/14/23  3.  Pt will be able to perform SLS for at least 5 sec to demo increasing stability and balance for bowling Baseline:  Goal status: NOT MET 05/14/23   LONG TERM GOALS: Target date: 04/23/2023   Pt will be ind with management of her HEP Baseline:  Goal status:  NOT MET 05/14/23  2.  Pt will be able to amb x 1000' for improved community mobility with </=3/10 hip pain per pt's goals Baseline:  Goal status: MET 05/14/23  3.  Pt will be able to perform 5x STS in </=15 sec to demo increasing functional LE strength Baseline:  Goal status: NOT MET 05/14/23  4.  Pt will have increased FOTO to >/=50 Baseline:  Goal status: MET 05/14/23    PLAN:  PT FREQUENCY:  none, DC 05/14/23  PT DURATION: none, DC 05/14/23  PLANNED INTERVENTIONS: 97164- PT Re-evaluation, 97110-Therapeutic exercises, 97530- Therapeutic activity, 97112- Neuromuscular re-education, 97535- Self Care, 16109- Manual therapy, [id number]- Gait training, [id number]- Aquatic Therapy, 97014- Electrical stimulation (unattended), [phone]- Ionotophoresis 4mg /ml Dexamethasone, Balance training, Taping, Dry Needling, Joint mobilization, Cryotherapy, and Moist heat  PLAN FOR NEXT SESSION:  DC today    Nedra Hai, PT, DPT 05/14/23 10:17 AM

## 2023-05-23 DIAGNOSIS — R350 Frequency of micturition: Secondary | ICD-10-CM | POA: Diagnosis not present

## 2023-05-23 DIAGNOSIS — N139 Obstructive and reflux uropathy, unspecified: Secondary | ICD-10-CM | POA: Diagnosis not present

## 2023-05-24 ENCOUNTER — Other Ambulatory Visit: Payer: Self-pay

## 2023-05-24 ENCOUNTER — Ambulatory Visit: Payer: Medicare PPO | Admitting: Surgical

## 2023-05-24 DIAGNOSIS — M19011 Primary osteoarthritis, right shoulder: Secondary | ICD-10-CM | POA: Diagnosis not present

## 2023-05-24 DIAGNOSIS — M25512 Pain in left shoulder: Secondary | ICD-10-CM | POA: Diagnosis not present

## 2023-05-24 DIAGNOSIS — M19012 Primary osteoarthritis, left shoulder: Secondary | ICD-10-CM

## 2023-05-24 DIAGNOSIS — M25511 Pain in right shoulder: Secondary | ICD-10-CM

## 2023-05-27 ENCOUNTER — Encounter: Payer: Self-pay | Admitting: Orthopedic Surgery

## 2023-05-27 MED ORDER — LIDOCAINE HCL 1 % IJ SOLN
5.0000 mL | INTRAMUSCULAR | Status: AC | PRN
  Administered 2023-05-24: 5 mL

## 2023-05-27 MED ORDER — METHYLPREDNISOLONE ACETATE 40 MG/ML IJ SUSP
40.0000 mg | INTRAMUSCULAR | Status: AC | PRN
  Administered 2023-05-24: 40 mg via INTRA_ARTICULAR

## 2023-05-27 MED ORDER — BUPIVACAINE HCL 0.25 % IJ SOLN
9.0000 mL | INTRAMUSCULAR | Status: AC | PRN
  Administered 2023-05-24: 9 mL via INTRA_ARTICULAR

## 2023-05-27 NOTE — Progress Notes (Signed)
Office Visit Note   Patient: Barbara Patton           Date of Birth: May 04, 1941           MRN: [id number] Visit Date: 05/24/2023 Requested by: Renford Dills, MD [address] [place],  [place] [address] PCP: Renford Dills, MD  Subjective: Chief Complaint  Patient presents with   Other    Complains of pain in collar bone-bilaterally x 1 wk    HPI: Barbara Patton is a 82 y.o. female who presents to the office reporting bilateral shoulder pain.  She describes pain in the anterior aspect of both shoulders with some radiation along the clavicle and into the trapezius muscles.  Left shoulder bothers her more than her right shoulder.  She is right-hand dominant.  She describes painful range of motion especially with overhead motion.  She denies any history of injury.  Pain is worse with raising the arm and turning her head at times.  No numbness or tingling.  No radicular pain down the arm.  She does have history of prior left rotator cuff repair years ago by Dr. August Saucer but no other shoulder surgeries.  No scapular pain.  Occasional neck pain but nothing consistent..                ROS: All systems reviewed are negative as they relate to the chief complaint within the history of present illness.  Patient denies fevers or chills.  Assessment & Plan: Visit Diagnoses:  1. Bilateral shoulder region arthritis   2. Bilateral shoulder pain, unspecified chronicity     Plan: Patient is a 82 year old female who presents for evaluation of bilateral shoulder pain.  Has had shoulder pain come on without history of injury over the last couple weeks.  She has had similar complaints before with radiographs of her sternoclavicular joints from March 2024 demonstrating mild to moderate degenerative changes of the  joints along with moderate to severe degenerative changes of the glenohumeral joints with large inferior humeral head osteophyte formation and rotator cuff arthropathy noted bilaterally.   We discussed options available to patient.  She would like to try bilateral glenohumeral injections today.  These were administered under ultrasound guidance and she will follow-up with the office as needed if pain does not improve.  If she has continued pain then could consider trying [facility] joint injection as a next step.    Follow-Up Instructions: No follow-ups on file.   Orders:  Orders Placed This Encounter  Procedures   US Guided Needle Placement - No Linked Charges   No orders of the defined types were placed in this encounter.     Procedures: Large Joint Inj: bilateral glenohumeral on 05/24/2023 11:56 AM Details: 22 G 3.5 in needle, ultrasound-guided posterior approach Medications (Right): 5 mL lidocaine 1 %; 9 mL bupivacaine 0.25 %; 40 mg methylPREDNISolone acetate 40 MG/ML Medications (Left): 5 mL lidocaine 1 %; 9 mL bupivacaine 0.25 %; 40 mg methylPREDNISolone acetate 40 MG/ML Outcome: tolerated well, no immediate complications Procedure, treatment alternatives, risks and benefits explained, specific risks discussed. Consent was given by the patient. Patient was prepped and draped in the usual sterile fashion.       Clinical Data: No additional findings.  Objective: Vital Signs: There were no vitals taken for this visit.  Physical Exam:  Constitutional: Patient appears well-developed HEENT:  Head: Normocephalic Eyes:EOM are normal Neck: Normal range of motion Cardiovascular: Normal rate Pulmonary/chest: Effort normal Neurologic: Patient is  alert Skin: Skin is warm Psychiatric: Patient has normal mood and affect  Ortho Exam: Ortho exam demonstrates left shoulder with 15 degrees X rotation, 75 degrees abduction, 130 degrees forward elevation passively and actively.  This compared with the right shoulder with 25 degrees X rotation, 80 degrees abduction, 145 degrees forward elevation passively and actively.  There is coarseness and crepitus noted with passive motion of  both shoulders.  Axillary nerve intact with deltoid firing bilaterally.  Intact EPL, FPL, finger abduction, pronation/supination, bicep, tricep, deltoid.  Rotator cuff strength intact in the left shoulder with intact supra, subscap, infra rated 5/5.  Right shoulder has supra and subscap strength rated 5/5 with infraspinatus strength rated 4/5.  She has tenderness over the sternoclavicular joints bilaterally rated mild to moderate.  AC joint tenderness moderately bilaterally.  She has moderate to severe tenderness over the bicipital groove of both shoulders.  No deformity noted to either shoulder.  Specialty Comments:  EXAM: MRI LUMBAR SPINE WITHOUT CONTRAST   TECHNIQUE: Multiplanar, multisequence MR imaging of the lumbar spine was performed. No intravenous contrast was administered.   COMPARISON:  Radiographs 10/01/2022 and 07/31/2022.  MRI 12/04/2019.   FINDINGS: Segmentation: Conventional anatomy assumed, with the last open disc space designated L5-S1.Concordant with prior imaging.   Alignment: Grade 1 anterolisthesis at L3-4 and L4-5, similar to previous MRI.   Vertebrae: There are mild superior endplate compression fractures at the L3 and L4 levels, resulting in approximately 10% loss of vertebral body height. There is associated bone marrow edema, but no osseous retropulsion or definite involvement of the posterior elements. These fractures are not clearly seen on recent or prior radiographs. No other acute osseous findings. The visualized sacroiliac joints appear unremarkable.   Conus medullaris: Extends to the L1-2 level and appears normal.   Paraspinal and other soft tissues: No acute paraspinal findings. The urinary bladder is noted to be distended and moderately trabeculated. 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   Disc levels:   Sagittal images demonstrate no significant disc space findings within the visualized lower thoracic spine. There is mild  disc bulging at T11-12 and T12-L1.   L1-2: Preserved disc height. Mild facet hypertrophy. No spinal stenosis or nerve root encroachment.   L2-3: Preserved disc height with mild disc bulging and mild-to-moderate facet hypertrophy. No spinal stenosis or nerve root encroachment.   L3-4: Moderate to severe facet hypertrophy accounts for the grade 1 anterolisthesis and contributes to annular disc bulging and uncovering. There is resulting moderate multifactorial spinal stenosis with mild lateral recess and minimal foraminal narrowing bilaterally. The spinal stenosis has progressed from the previous MRI   L4-5: Moderate to severe facet hypertrophy accounts for the grade 1 anterolisthesis and contributes to annular disc bulging and uncovering. There is only resulting borderline spinal stenosis at this level with minimal left lateral recess and left foraminal narrowing. No nerve root encroachment. No significant changes are seen at this level.   L5-S1: Chronic disc bulging with endplate osteophytes asymmetric to the left. Mild facet hypertrophy. No spinal stenosis or nerve root encroachment.   IMPRESSION: 1. Acute/subacute mild superior endplate compression fractures at L3 and L4 with associated bone marrow edema, but no osseous retropulsion. 2. 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 3. Borderline spinal stenosis at L4-5 secondary to facet hypertrophy and a grade 1 anterolisthesis. 4. Distended urinary bladder with trabeculation, suggesting chronic bladder outlet obstruction.     Electronically Signed   By: Carey Bullocks  M.D.   On: 10/26/2022 10:57  Imaging: No results found.   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   Menopausal symptom 10/20/2021    Overweight 10/20/2021   History of colonic polyps 10/20/2021   Urinary incontinence 10/20/2021   Globus pharyngeus 05/23/2021   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   Referred otalgia of left ear 08/23/2015   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   Allergic rhinitis 07/26/2015   Anxiety 07/26/2015   Benign essential hypertension 07/26/2015   Dysfunction of left eustachian tube 07/26/2015   Esophageal reflux 07/26/2015   Fibromyalgia 07/26/2015   Pure hypercholesterolemia 07/26/2015   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 05/03/2014   Pes anserinus bursitis of left knee 12/04/2013   Trochanteric bursitis of left hip 08/07/2013   Leg swelling 12/06/2011   Glaucoma 11/02/2011   HLD (hyperlipidemia) 11/02/2011   HTN (hypertension) 11/02/2011   Hypothyroid 11/02/2011   OA (osteoarthritis) 11/02/2011   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) 03/16/2011   Presence of artificial knee joint 03/16/2011   Past Medical History:  Diagnosis Date   Glaucoma    High cholesterol    Hypertension    Lupus    Thyroid disease     Family History  Problem Relation Age of Onset   Cancer Father    Cancer Brother    Cancer Brother    Stroke Brother     Past Surgical History:  Procedure Laterality Date   ABDOMINAL HYSTERECTOMY     CATARACT EXTRACTION, BILATERAL     TOTAL KNEE ARTHROPLASTY Bilateral    TUBAL LIGATION     Social History   Occupational History   Not on file  Tobacco Use   Smoking status: Former    Current packs/day: 0.00    Types: Cigarettes    Quit date: 1997    Years since quitting: 27.9   Smokeless tobacco: Never  Vaping Use   Vaping status: Never Used  Substance and Sexual Activity   Alcohol use: Yes    Alcohol/week: 3.0 standard drinks of alcohol    Types: 3 Glasses of wine per week    Comment: occ wine   Drug use: No   Sexual activity: Not on file

## 2023-06-09 ENCOUNTER — Other Ambulatory Visit: Payer: Self-pay

## 2023-06-09 ENCOUNTER — Emergency Department (HOSPITAL_COMMUNITY)
Admission: EM | Admit: 2023-06-09 | Discharge: 2023-06-10 | Discharge status: Against Medical Advice | Payer: Medicare HMO | Attending: Emergency Medicine | Admitting: Emergency Medicine

## 2023-06-09 ENCOUNTER — Emergency Department (HOSPITAL_COMMUNITY): Payer: Medicare HMO

## 2023-06-09 DIAGNOSIS — M1612 Unilateral primary osteoarthritis, left hip: Secondary | ICD-10-CM | POA: Diagnosis not present

## 2023-06-09 DIAGNOSIS — Z5321 Procedure and treatment not carried out due to patient leaving prior to being seen by health care provider: Secondary | ICD-10-CM | POA: Insufficient documentation

## 2023-06-09 DIAGNOSIS — M79605 Pain in left leg: Secondary | ICD-10-CM | POA: Diagnosis not present

## 2023-06-09 NOTE — ED Notes (Signed)
 Pt seen leaving the ED

## 2023-06-09 NOTE — ED Triage Notes (Addendum)
 L leg pain x1 day. Pt states that the pain worsens when she walks. No fall/injury reported.

## 2023-06-09 NOTE — ED Notes (Signed)
 Pt leaving, said she will come back later today. Advised pt if symptoms get worse to call 911 or come back sooner.

## 2023-06-10 ENCOUNTER — Encounter (HOSPITAL_COMMUNITY): Payer: Self-pay | Admitting: Emergency Medicine

## 2023-06-10 ENCOUNTER — Ambulatory Visit (HOSPITAL_COMMUNITY)
Admission: EM | Admit: 2023-06-10 | Discharge: 2023-06-10 | Disposition: A | Discharge status: Home / Self Care | Payer: Medicare HMO | Attending: Emergency Medicine | Admitting: Emergency Medicine

## 2023-06-10 DIAGNOSIS — M79605 Pain in left leg: Secondary | ICD-10-CM | POA: Diagnosis not present

## 2023-06-10 DIAGNOSIS — R21 Rash and other nonspecific skin eruption: Secondary | ICD-10-CM | POA: Diagnosis not present

## 2023-06-10 MED ORDER — DEXAMETHASONE SODIUM PHOSPHATE 10 MG/ML IJ SOLN
INTRAMUSCULAR | Status: AC
Start: 2023-06-10 — End: ?
  Filled 2023-06-10: qty 1

## 2023-06-10 MED ORDER — DEXAMETHASONE SODIUM PHOSPHATE 10 MG/ML IJ SOLN
10.0000 mg | Freq: Once | INTRAMUSCULAR | Status: AC
  Administered 2023-06-10: 10 mg via INTRAMUSCULAR

## 2023-06-10 MED ORDER — TRIAMCINOLONE ACETONIDE 0.1 % EX CREA: 1.0000 | TOPICAL_CREAM | Freq: Two times a day (BID) | CUTANEOUS | 0 refills | Status: AC

## 2023-06-10 NOTE — ED Triage Notes (Addendum)
 Pt c/o left leg pain for 2 days. She went to ED yesterday but left before being seen due to wait.  Denies fall/injury.  She took tylenol and tramadol this am around 8am

## 2023-06-10 NOTE — Discharge Instructions (Addendum)
 We have given you a steroid injection in clinic to help with your pain and inflammation.  Apply the triamcinolone  ointment twice daily to the itchy rash, do not use this for longer than 7 days as this may cause skin thinning.  Follow-up with your orthopedic for further evaluation on Monday.  Return to clinic for any new or urgent symptoms.

## 2023-06-10 NOTE — ED Provider Notes (Addendum)
 [facility]    CSN: [id number] Arrival date & time: 06/10/23  1547      History   Chief Complaint Chief Complaint  Patient presents with   Leg Pain    HPI Barbara Patton is a 83 y.o. female.   Patient presents to clinic for left hip and thigh pain that has been present for the past 2 days.  Reports ongoing pain to this area but it was sudden and markedly increased 2 days ago.  She did go to the emergency department yesterday but left before her treatment was completed due to prolonged wait time.  She did have a femur x-ray at the time that showed 'no acute fracture or dislocation. Moderate degenerative arthritis left hip.'  She denies any injury, trauma or falls.  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   She takes tramadol daily, took it this morning and it has not really helped her pain.  Denies any swelling of the legs.  Also has an itchy rash to her right lower leg.  She has been applying a cream to it that comes from a white tube, is unsure what kind of medicine this is.  Has been present for a while.   The history is provided by the patient and medical records.  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   Referred otalgia of left ear 08/23/2015   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   Allergic rhinitis 07/26/2015    Anxiety 07/26/2015   Benign essential hypertension 07/26/2015   Dysfunction of left eustachian tube 07/26/2015   Esophageal reflux 07/26/2015   Fibromyalgia 07/26/2015   Pure hypercholesterolemia 07/26/2015   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 05/03/2014   Pes anserinus bursitis of left knee 12/04/2013   Trochanteric bursitis of left hip 08/07/2013   Leg swelling 12/06/2011   Glaucoma 11/02/2011   HLD (hyperlipidemia) 11/02/2011   HTN (hypertension) 11/02/2011   Hypothyroid 11/02/2011   OA (osteoarthritis) 11/02/2011   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) 03/16/2011   Presence of artificial knee joint 03/16/2011    Past Surgical History:  Procedure Laterality Date   ABDOMINAL HYSTERECTOMY     CATARACT EXTRACTION, BILATERAL     TOTAL KNEE ARTHROPLASTY Bilateral    TUBAL LIGATION      OB History   No obstetric history on file.      Home Medications    Prior to Admission medications   Medication Sig Start Date End Date Taking? Authorizing Provider  Ergocalciferol  62.5 MCG (2500 UT) CAPS 1 po daily x 3 weeks and then weekly x 16 weeks 01/10/23   Addie Cordella Hamilton, MD  triamcinolone  cream (KENALOG ) 0.1 % Apply 1 Application topically 2 (two) times daily. 06/10/23  Yes Dreama, Rainna Nearhood  N,  FNP  acetaminophen  (TYLENOL ) 325 MG tablet Take 650 mg by mouth 2 (two) times daily as needed (pain).    [provider]  Cholecalciferol (VITAMIN D -3 PO) Take 1 tablet by mouth daily.    [provider]  diclofenac  Sodium (VOLTAREN ) 1 % GEL Add'l Sig Add'l Sig topical Add'l Sig 08/21/19   [provider]  DULoxetine (CYMBALTA) 30 MG capsule  11/03/20   [provider]  furosemide (LASIX) 40 MG tablet Take 40 mg by mouth daily. 12/22/19   [provider]  hydroxychloroquine (PLAQUENIL) 200 MG tablet Take 200 mg by mouth daily.  06/19/16   [provider]   hydrOXYzine (ATARAX/VISTARIL) 10 MG tablet Take 10 mg by mouth every 6 (six) hours as needed for itching or anxiety. 06/10/17   [provider]  levothyroxine (SYNTHROID) 100 MCG tablet Take 100 mcg by mouth daily. 11/11/19   [provider]  Multiple Vitamins-Minerals (MULTIVITAMIN PO) Take 1 tablet by mouth daily.     [provider]  omeprazole (PRILOSEC) 40 MG capsule Take 40 mg by mouth daily.  03/09/16   [provider]  primidone  (MYSOLINE ) 50 MG tablet TAKE 1 TABLET TWICE DAILY 05/08/23   Tat, Asberry RAMAN, DO  simvastatin (ZOCOR) 20 MG tablet Take 20 mg by mouth every evening.  11/06/13   [provider]  estradiol (ESTRACE) 0.5 MG tablet  02/14/18 11/09/19  [provider]    Family History Family History  Problem Relation Age of Onset   Cancer Father    Cancer Brother    Cancer Brother    Stroke Brother     Social History Social History   Tobacco Use   Smoking status: Former    Current packs/day: 0.00    Types: Cigarettes    Quit date: 1997    Years since quitting: 28.0   Smokeless tobacco: Never  Vaping Use   Vaping status: Never Used  Substance Use Topics   Alcohol use: Yes    Alcohol/week: 3.0 standard drinks of alcohol    Types: 3 Glasses of wine per week    Comment: occ wine   Drug use: No     Allergies   Codeine, Penicillin g, and Penicillins   Review of Systems Review of Systems  Per HPI   Physical Exam Triage Vital Signs ED Triage Vitals  Encounter Vitals Group     BP 06/10/23 1605 (!) 182/66     Systolic BP Percentile --      Diastolic BP Percentile --      Pulse Rate 06/10/23 1605 (!) 46     Resp 06/10/23 1605 17     Temp 06/10/23 1605 98 F (36.7 C)     Temp Source 06/10/23 1605 Oral     SpO2 06/10/23 1605 97 %     Weight --      Height --      Head Circumference --      Peak Flow --      Pain Score 06/10/23 1603 10     Pain Loc --      Pain Education --      Exclude from Growth Chart  --    No data found.  Updated Vital Signs BP (!) 182/66   Pulse (!) 46   Temp 98 F (36.7 C) (Oral)   Resp 17   SpO2 97%   Visual Acuity Right Eye Distance:   Left Eye Distance:   Bilateral Distance:  Right Eye Near:   Left Eye Near:    Bilateral Near:     Physical Exam Vitals and nursing note reviewed.  Constitutional:      Appearance: Normal appearance.  HENT:     Head: Normocephalic and atraumatic.     Right Ear: External ear normal.     Left Ear: External ear normal.     Nose: Nose normal.     Mouth/Throat:     Mouth: Mucous membranes are moist.  Eyes:     General: No scleral icterus. Cardiovascular:     Rate and Rhythm: Normal rate.  Pulmonary:     Effort: Pulmonary effort is normal. No respiratory distress.  Musculoskeletal:        General: No swelling, tenderness, deformity or signs of injury. Normal range of motion.  Skin:    General: Skin is warm and dry.     Findings: Rash present. Rash is scaling and urticarial.       Neurological:     General: No focal deficit present.     Mental Status: She is alert and oriented to person, place, and time.  Psychiatric:        Mood and Affect: Mood normal.        Behavior: Behavior normal.        UC Treatments / Results  Labs (all labs ordered are listed, but only abnormal results are displayed) Labs Reviewed - No data to display  EKG   Radiology DG FEMUR PORT 1V LEFT Result Date: 06/09/2023 CLINICAL DATA:  Left leg pain, denies injury or fall EXAM: LEFT FEMUR PORTABLE 1 VIEW COMPARISON:  10/01/2022 FINDINGS: No acute fracture or dislocation. Moderate degenerative arthritis left hip. Partially visualized left TKA. IMPRESSION: No acute fracture or dislocation. Moderate degenerative arthritis left hip. Electronically Signed   By: Norman Gatlin M.D.   On: 06/09/2023 19:53    Procedures Procedures (including critical care time)  Medications Ordered in UC Medications  dexamethasone  (DECADRON )  injection 10 mg (10 mg Intramuscular Given 06/10/23 1630)    Initial Impression / Assessment and Plan / UC Course  I have reviewed the triage vital signs and the nursing notes.  Pertinent labs & imaging results that were available during my care of the patient were reviewed by me and considered in my medical decision making (see chart for details).  Vitals and triage reviewed, patient is hemodynamically stable.  Left hip and thigh pain, atraumatic.  Extensive history of osteoarthritis, bursitis of bilateral trochanters and fibromyalgia.  Sees orthopedics regularly for joint injections.  Last seen May 24, 2023.  Imaging completed in the emergency department does not show any acute fractures or abnormalities besides moderate arthritis of the left hip and a artificial left knee.  No swelling in lower extremities.  No rashes to the skin.  Atraumatic.  No benefit in repeating imaging.  Suspect chronic conditions causing a flareup of left leg pain.  Will treat with IM steroid in clinic and encouraged orthopedic follow-up on Monday.  Scaly if, flaky, dry rash that appears pruritic and urticarial to right lower leg.  Will send in triamcinolone  ointment for this.  Plan of care, follow-up care and return precautions given, no questions at this time.  Final Clinical Impressions(s) / UC Diagnoses   Final diagnoses:  Left leg pain  Rash and nonspecific skin eruption     Discharge Instructions      We have given you a steroid injection in clinic to help with your pain and  inflammation.  Apply the triamcinolone  ointment twice daily to the itchy rash, do not use this for longer than 7 days as this may cause skin thinning.  Follow-up with your orthopedic for further evaluation on Monday.  Return to clinic for any new or urgent symptoms.     ED Prescriptions     Medication Sig Dispense Auth. Provider   triamcinolone  cream (KENALOG ) 0.1 % Apply 1 Application topically 2 (two) times daily. 30 g  Dreama, Jing Howatt  N, FNP      I have reviewed the PDMP during this encounter.   Dreama, Ahmia Colford  N, FNP 06/10/23 1629    Dreama Olita SAILOR, FNP 06/10/23 1629    Dreama Elliot SAILOR, FNP 06/10/23 1629    Dreama, Oriyah Lamphear  N, FNP 06/10/23 1630    Dreama Mateo SAILOR, FNP 06/10/23 [phone]

## 2023-06-13 DIAGNOSIS — M329 Systemic lupus erythematosus, unspecified: Secondary | ICD-10-CM | POA: Diagnosis not present

## 2023-06-13 DIAGNOSIS — E7800 Pure hypercholesterolemia, unspecified: Secondary | ICD-10-CM | POA: Diagnosis not present

## 2023-06-13 DIAGNOSIS — M2550 Pain in unspecified joint: Secondary | ICD-10-CM | POA: Diagnosis not present

## 2023-06-13 DIAGNOSIS — I5189 Other ill-defined heart diseases: Secondary | ICD-10-CM | POA: Diagnosis not present

## 2023-06-13 DIAGNOSIS — M797 Fibromyalgia: Secondary | ICD-10-CM | POA: Diagnosis not present

## 2023-06-13 DIAGNOSIS — I10 Essential (primary) hypertension: Secondary | ICD-10-CM | POA: Diagnosis not present

## 2023-06-13 DIAGNOSIS — E039 Hypothyroidism, unspecified: Secondary | ICD-10-CM | POA: Diagnosis not present

## 2023-06-13 DIAGNOSIS — G8929 Other chronic pain: Secondary | ICD-10-CM | POA: Diagnosis not present

## 2023-06-14 ENCOUNTER — Encounter: Payer: Self-pay | Admitting: Physician Assistant

## 2023-06-14 ENCOUNTER — Ambulatory Visit (INDEPENDENT_AMBULATORY_CARE_PROVIDER_SITE_OTHER): Payer: Medicare HMO | Admitting: Physician Assistant

## 2023-06-14 DIAGNOSIS — M1612 Unilateral primary osteoarthritis, left hip: Secondary | ICD-10-CM | POA: Diagnosis not present

## 2023-06-14 NOTE — Progress Notes (Signed)
 Office Visit Note   Patient: Barbara Patton           Date of Birth: [birth year]           MRN: [id number] Visit Date: 06/14/2023              Requested by: Rexanne Ingle, MD [address] [place],  [place] [address] PCP: Rexanne Ingle, MD  Chief Complaint  Patient presents with   Left Leg - Pain      HPI: Patient is a pleasant 83 year old woman who is a patient of Dr. Sedrick she comes in today complaining of chronic left hip pain.  She has had no injury.  The pain was significant enough that she did go to the emergency room where x-rays were taken did not show a fracture.  Pain starts from her groin and goes down to her knee.  Denies any new onset weakness denies any fever or chills  Assessment & Plan: Visit Diagnoses: Arthritis left hip  Plan: She is a patient of Dr. Addie she has had multiple injections into her troches bursa her right hip and I let her left hip.  She does not think any of them of giving her any pain relief.  She does have an appointment with Herlene on Monday could discuss hip replacement surgery if she is a candidate.  She currently is on tramadol from her primary care physician uses a cane will follow-up with Herlene on Monday  Follow-Up Instructions: Has a follow-up already  Ortho Exam  Patient is alert, oriented, no adenopathy, well-dressed, normal affect, normal respiratory effort. Ambulates with an antalgic gait.  She is neurovascularly intact negative straight leg raise she has pain focally in her groin is increased with logrolling.  Compartments are soft and nontender  Imaging: No results found. No images are attached to the encounter.  Labs: Lab Results  Component Value Date   REPTSTATUS 01/01/2020 FINAL 12/31/2019   CULT  12/31/2019    NO GROWTH Performed at [facility], [address] [place], [place] [address]    IDOLINA CAIRO KOSERI 12/21/2010     Lab Results  Component Value Date   ALBUMIN 4.2 12/03/2021    ALBUMIN 3.5 01/27/2014    No results found for: MG Lab Results  Component Value Date   VD25OH 29 (L) 01/04/2023    No results found for: PREALBUMIN    Latest Ref Rng & Units 12/03/2021    5:08 PM 01/27/2014   10:14 AM 12/15/2010    9:40 AM  CBC EXTENDED  WBC 4.0 - 10.5 K/uL 3.4  4.0  4.5   RBC 3.87 - 5.11 MIL/uL 4.09  3.87  3.90   Hemoglobin 12.0 - 15.0 g/dL 86.1  87.7  87.7   HCT 36.0 - 46.0 % 42.0  37.7  36.4   Platelets 150 - 400 K/uL 203  197  212   NEUT# 1.7 - 7.7 K/uL 2.8     Lymph# 0.7 - 4.0 K/uL 0.5        There is no height or weight on file to calculate BMI.  Orders:  No orders of the defined types were placed in this encounter.  No orders of the defined types were placed in this encounter.    Procedures: No procedures performed  Clinical Data: No additional findings.  ROS:  All other systems negative, except as noted in the HPI. Review of Systems  Objective: Vital Signs: There were no vitals  taken for this visit.  Specialty Comments:  EXAM: MRI LUMBAR SPINE WITHOUT CONTRAST   TECHNIQUE: Multiplanar, multisequence MR imaging of the lumbar spine was performed. No intravenous contrast was administered.   COMPARISON:  Radiographs 10/01/2022 and 07/31/2022.  MRI 12/04/2019.   FINDINGS: Segmentation: Conventional anatomy assumed, with the last open disc space designated L5-S1.Concordant with prior imaging.   Alignment: Grade 1 anterolisthesis at L3-4 and L4-5, similar to previous MRI.   Vertebrae: There are mild superior endplate compression fractures at the L3 and L4 levels, resulting in approximately 10% loss of vertebral body height. There is associated bone marrow edema, but no osseous retropulsion or definite involvement of the posterior elements. These fractures are not clearly seen on recent or prior radiographs. No other acute osseous findings. The visualized sacroiliac joints appear unremarkable.   Conus medullaris: Extends to the  L1-2 level and appears normal.   Paraspinal and other soft tissues: No acute paraspinal findings. The urinary bladder is noted to be distended and moderately trabeculated. 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   Disc levels:   Sagittal images demonstrate no significant disc space findings within the visualized lower thoracic spine. There is mild disc bulging at T11-12 and T12-L1.   L1-2: Preserved disc height. Mild facet hypertrophy. No spinal stenosis or nerve root encroachment.   L2-3: Preserved disc height with mild disc bulging and mild-to-moderate facet hypertrophy. No spinal stenosis or nerve root encroachment.   L3-4: Moderate to severe facet hypertrophy accounts for the grade 1 anterolisthesis and contributes to annular disc bulging and uncovering. There is resulting moderate multifactorial spinal stenosis with mild lateral recess and minimal foraminal narrowing bilaterally. The spinal stenosis has progressed from the previous MRI   L4-5: Moderate to severe facet hypertrophy accounts for the grade 1 anterolisthesis and contributes to annular disc bulging and uncovering. There is only resulting borderline spinal stenosis at this level with minimal left lateral recess and left foraminal narrowing. No nerve root encroachment. No significant changes are seen at this level.   L5-S1: Chronic disc bulging with endplate osteophytes asymmetric to the left. Mild facet hypertrophy. No spinal stenosis or nerve root encroachment.   IMPRESSION: 1. Acute/subacute mild superior endplate compression fractures at L3 and L4 with associated bone marrow edema, but no osseous retropulsion. 2. 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 3. Borderline spinal stenosis at L4-5 secondary to facet hypertrophy and a grade 1 anterolisthesis. 4. Distended urinary bladder with trabeculation, suggesting chronic bladder outlet  obstruction.     Electronically Signed   By: Elsie Perone M.D.   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   Allergic rhinitis 07/26/2015   Anxiety 07/26/2015   Benign essential hypertension 07/26/2015   Dysfunction of left eustachian tube 07/26/2015   Esophageal reflux 07/26/2015   Fibromyalgia 07/26/2015   Pure hypercholesterolemia 07/26/2015   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 05/03/2014   Pes anserinus bursitis of left  knee 12/04/2013   Trochanteric bursitis of left hip 08/07/2013   Leg swelling 12/06/2011   Glaucoma 11/02/2011   HLD (hyperlipidemia) 11/02/2011   HTN (hypertension) 11/02/2011   Hypothyroid 11/02/2011   OA (osteoarthritis) 11/02/2011   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) 03/16/2011   Presence of artificial knee joint 03/16/2011   Past Medical History:  Diagnosis Date   Glaucoma    High cholesterol    Hypertension    Lupus    Thyroid  disease     Family History  Problem Relation Age of Onset   Cancer Father    Cancer Brother    Cancer Brother    Stroke Brother     Past Surgical History:  Procedure Laterality Date   ABDOMINAL HYSTERECTOMY     CATARACT EXTRACTION, BILATERAL     TOTAL KNEE ARTHROPLASTY Bilateral    TUBAL LIGATION     Social History   Occupational History   Not on file  Tobacco Use   Smoking status: Former    Current packs/day: 0.00    Types:  Cigarettes    Quit date: 1997    Years since quitting: 28.0   Smokeless tobacco: Never  Vaping Use   Vaping status: Never Used  Substance and Sexual Activity   Alcohol use: Yes    Alcohol/week: 3.0 standard drinks of alcohol    Types: 3 Glasses of wine per week    Comment: occ wine   Drug use: No   Sexual activity: Not on file

## 2023-06-18 ENCOUNTER — Other Ambulatory Visit (INDEPENDENT_AMBULATORY_CARE_PROVIDER_SITE_OTHER): Payer: Medicare HMO

## 2023-06-18 ENCOUNTER — Ambulatory Visit: Payer: Medicare HMO | Admitting: Surgical

## 2023-06-18 VITALS — Ht 66.0 in | Wt 190.0 lb

## 2023-06-18 DIAGNOSIS — M5416 Radiculopathy, lumbar region: Secondary | ICD-10-CM

## 2023-06-18 DIAGNOSIS — M25552 Pain in left hip: Secondary | ICD-10-CM

## 2023-06-20 ENCOUNTER — Encounter: Payer: Self-pay | Admitting: Surgical

## 2023-06-20 ENCOUNTER — Telehealth: Payer: Self-pay | Admitting: Surgical

## 2023-06-20 ENCOUNTER — Other Ambulatory Visit: Payer: Self-pay

## 2023-06-20 DIAGNOSIS — M25552 Pain in left hip: Secondary | ICD-10-CM

## 2023-06-20 NOTE — Progress Notes (Signed)
Office Visit Note   Patient: Barbara Patton           Date of Birth: [birth year]           MRN: [id number] Visit Date: 06/18/2023 Requested by: Renford Dills, MD [address] [place],  [place] [address] PCP: Renford Dills, MD  Subjective: Chief Complaint  Patient presents with   Left Leg - Pain    HPI: Barbara Patton is a 83 y.o. female who presents to the office reporting left hip pain.  Patient has history of multiple etiologies of hip pain with previous partial response to L-spine ESI, trochanteric injection, left hip intra-articular injection.  She reports 2 weeks of increased left hip pain that first came on as she was lifting her leg into her bed.  No recent fall or severe injury.  Describes pain in the groin region that radiates down the medial aspect of the left leg to her knee with no radiation past the knee.  Also has lateral sided hip pain that radiates from the buttock and lateral hip down to the lateral aspect of the knee.  Pain is waking her up at night now.  She had an intramuscular steroid injection at an urgent care recently that provided no significant relief.  Does note some numbness and tingling at times in her left leg primarily in the medial thigh.  She is ambulating with a cane.  No history of prior surgery to her left hip.  Last intra-articular injection was over 3 months ago.  She did have bilateral trochanteric injections on 03/12/2023.              ROS: All systems reviewed are negative as they relate to the chief complaint within the history of present illness.  Patient denies fevers or chills.  Assessment & Plan: Visit Diagnoses:  1. Pain in left hip   2. Lumbar radiculopathy     Plan: Patient is an 83 year old female who presents for evaluation of left hip pain.  2 weeks of hip pain since lifting her leg into the bed.  Has pain primarily with weightbearing and with hip flexion actively.  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  She has had good response to intra-articular as well as greater trochanteric hip injections in the past but she says that this pain is so bad that she is tired of dealing with her hip pain and she would like to proceed with hip replacement.  I did offer left hip cortisone injection today but she would like to forego this because her pain is bad enough that she does not want to take the chance that she has cortisone injection that only lasted for couple days or weeks and then she has to wait 3 months for surgery.  Plan to set patient up to see Dr. Roda Shutters for surgical consultation for left hip replacement.  There is also a likely contribution from her lumbar spine pathology with the numbness and radiation of some of the pain from her buttock so we will set up lumbar spine ESI as well.  Patient discussed with Dr. August Saucer today.  Follow-Up Instructions: No follow-ups on file.   Orders:  Orders Placed This Encounter  Procedures   XR HIP UNILAT W OR W/O PELVIS 2-3 VIEWS LEFT   Ambulatory referral to Physical Medicine Rehab   No orders of the defined types were placed in  this encounter.     Procedures: No procedures performed   Clinical Data: No additional findings.  Objective: Vital Signs: Ht 5\' 6"  (1.676 m)   Wt 190 lb (86.2 kg)   BMI 30.67 kg/m   Physical Exam:  Constitutional: Patient appears well-developed HEENT:  Head: Normocephalic Eyes:EOM are normal Neck: Normal range of motion Cardiovascular: Normal rate Pulmonary/chest: Effort normal Neurologic: Patient is alert Skin: Skin is warm Psychiatric: Patient has normal mood and affect  Ortho Exam: Ortho exam demonstrates left hip with increased pain localizing to the groin and anterior thigh with flexion and internal rotation of the left hip while patient is supine.  She has positive Stinchfield sign reproducing groin pain.  Intact ankle dorsiflexion,  plantarflexion, quadricep, hamstring, hip flexion strength.  No significant tenderness over the greater trochanters bilaterally.  Negative straight leg raise bilaterally.  Specialty Comments:  EXAM: MRI LUMBAR SPINE WITHOUT CONTRAST   TECHNIQUE: Multiplanar, multisequence MR imaging of the lumbar spine was performed. No intravenous contrast was administered.   COMPARISON:  Radiographs 10/01/2022 and 07/31/2022.  MRI 12/04/2019.   FINDINGS: Segmentation: Conventional anatomy assumed, with the last open disc space designated L5-S1.Concordant with prior imaging.   Alignment: Grade 1 anterolisthesis at L3-4 and L4-5, similar to previous MRI.   Vertebrae: There are mild superior endplate compression fractures at the L3 and L4 levels, resulting in approximately 10% loss of vertebral body height. There is associated bone marrow edema, but no osseous retropulsion or definite involvement of the posterior elements. These fractures are not clearly seen on recent or prior radiographs. No other acute osseous findings. The visualized sacroiliac joints appear unremarkable.   Conus medullaris: Extends to the L1-2 level and appears normal.   Paraspinal and other soft tissues: No acute paraspinal findings. The urinary bladder is noted to be distended and moderately trabeculated. 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   Disc levels:   Sagittal images demonstrate no significant disc space findings within the visualized lower thoracic spine. There is mild disc bulging at T11-12 and T12-L1.   L1-2: Preserved disc height. Mild facet hypertrophy. No spinal stenosis or nerve root encroachment.   L2-3: Preserved disc height with mild disc bulging and mild-to-moderate facet hypertrophy. No spinal stenosis or nerve root encroachment.   L3-4: Moderate to severe facet hypertrophy accounts for the grade 1 anterolisthesis and contributes to annular disc bulging and uncovering. There is  resulting moderate multifactorial spinal stenosis with mild lateral recess and minimal foraminal narrowing bilaterally. The spinal stenosis has progressed from the previous MRI   L4-5: Moderate to severe facet hypertrophy accounts for the grade 1 anterolisthesis and contributes to annular disc bulging and uncovering. There is only resulting borderline spinal stenosis at this level with minimal left lateral recess and left foraminal narrowing. No nerve root encroachment. No significant changes are seen at this level.   L5-S1: Chronic disc bulging with endplate osteophytes asymmetric to the left. Mild facet hypertrophy. No spinal stenosis or nerve root encroachment.   IMPRESSION: 1. Acute/subacute mild superior endplate compression fractures at L3 and L4 with associated bone marrow edema, but no osseous retropulsion. 2. 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 3. Borderline spinal stenosis at L4-5 secondary to facet hypertrophy and a grade 1 anterolisthesis. 4. Distended urinary bladder with trabeculation, suggesting chronic bladder outlet obstruction.     Electronically Signed   By: Carey Bullocks M.D.   On: 10/26/2022 10:57  Imaging: No results found.   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   Allergic rhinitis 07/26/2015   Anxiety 07/26/2015   Benign essential hypertension 07/26/2015   Dysfunction of left eustachian tube 07/26/2015   Esophageal reflux  07/26/2015   Fibromyalgia 07/26/2015   Pure hypercholesterolemia 07/26/2015   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 05/03/2014   Pes anserinus bursitis of left knee 12/04/2013   Trochanteric bursitis of left hip 08/07/2013   Leg swelling 12/06/2011   Glaucoma 11/02/2011   HLD (hyperlipidemia) 11/02/2011   HTN (hypertension) 11/02/2011   Hypothyroid 11/02/2011   OA (osteoarthritis) 11/02/2011   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) 03/16/2011   Presence of artificial knee joint 03/16/2011   Past Medical History:  Diagnosis Date   Glaucoma    High cholesterol    Hypertension    Lupus    Thyroid disease     Family History  Problem Relation Age of Onset   Cancer Father    Cancer Brother    Cancer Brother    Stroke Brother     Past Surgical History:  Procedure Laterality Date   ABDOMINAL HYSTERECTOMY     CATARACT EXTRACTION, BILATERAL     TOTAL KNEE ARTHROPLASTY Bilateral    TUBAL LIGATION     Social History   Occupational History   Not on file  Tobacco Use   Smoking status: Former    Current packs/day: 0.00    Types: Cigarettes    Quit date: 1997    Years since quitting: 28.0   Smokeless tobacco: Never  Vaping Use   Vaping status: Never Used  Substance and Sexual Activity   Alcohol use: Yes    Alcohol/week: 3.0 standard drinks of alcohol    Types: 3 Glasses of wine per week    Comment: occ wine   Drug use: No   Sexual activity: Not on file

## 2023-06-20 NOTE — Telephone Encounter (Signed)
 thx

## 2023-06-20 NOTE — Telephone Encounter (Signed)
 Patient called. Says she will try pain management and not have surgery. FYI

## 2023-06-20 NOTE — Telephone Encounter (Signed)
 Placed referral for pain mgmt.

## 2023-06-21 ENCOUNTER — Ambulatory Visit: Payer: Medicare HMO | Admitting: Orthopaedic Surgery

## 2023-06-26 ENCOUNTER — Telehealth: Payer: Self-pay | Admitting: Orthopedic Surgery

## 2023-06-26 NOTE — Telephone Encounter (Signed)
Pt is requesting a phone call back regarding rehab for LT hip

## 2023-06-27 DIAGNOSIS — R09A2 Foreign body sensation, throat: Secondary | ICD-10-CM | POA: Diagnosis not present

## 2023-06-27 DIAGNOSIS — H903 Sensorineural hearing loss, bilateral: Secondary | ICD-10-CM | POA: Diagnosis not present

## 2023-06-27 DIAGNOSIS — R040 Epistaxis: Secondary | ICD-10-CM | POA: Diagnosis not present

## 2023-06-27 DIAGNOSIS — H6122 Impacted cerumen, left ear: Secondary | ICD-10-CM | POA: Diagnosis not present

## 2023-06-28 NOTE — Telephone Encounter (Signed)
Questions answered.

## 2023-06-29 DIAGNOSIS — H401132 Primary open-angle glaucoma, bilateral, moderate stage: Secondary | ICD-10-CM | POA: Diagnosis not present

## 2023-06-29 DIAGNOSIS — H52203 Unspecified astigmatism, bilateral: Secondary | ICD-10-CM | POA: Diagnosis not present

## 2023-07-02 DIAGNOSIS — M47816 Spondylosis without myelopathy or radiculopathy, lumbar region: Secondary | ICD-10-CM | POA: Diagnosis not present

## 2023-07-02 DIAGNOSIS — G8929 Other chronic pain: Secondary | ICD-10-CM | POA: Diagnosis not present

## 2023-07-02 DIAGNOSIS — M25552 Pain in left hip: Secondary | ICD-10-CM | POA: Diagnosis not present

## 2023-07-02 DIAGNOSIS — M25551 Pain in right hip: Secondary | ICD-10-CM | POA: Diagnosis not present

## 2023-07-05 ENCOUNTER — Telehealth: Payer: Self-pay | Admitting: Orthopedic Surgery

## 2023-07-05 NOTE — Progress Notes (Signed)
 Assessment/Plan:    1.  Hand pain and paresthesias  -EMG negative  -MRI cervical spine essentially unrevealing  -she is following with pain management with [facility]  2.  Tremor  -She has tried primidone  and propranolol .  She initially told me that neither helped, but when off of the medication and felt tremor was worse. She stated today that it wasn't helping but she decided to stay on it for now.  -continue primidone  50 mg bid  -on lyrica that was just started for hand pain  -she declines 2nd opinions  3. Bradycardia  -pt quite bradycardic in the office today, with pulse rate only in the 40s.  She is on no medication that should cause this.  I told her to follow-up with primary care.  She is asymptomatic.  4.  F/u 9 months   Subjective:   DELOIS SILVESTER was seen today.  When I saw her last on July 17, the patient told me that neither primidone  nor propranolol  helped and I told her that unfortunately I did not really have much more to offer.  Second opinion was offered and she declined.  The biggest issue is that I have not really seen much tremor, but she had continued to complain of tremor.  About 10 days after our last visit, the patient called here stating that she knew that she had told us  that the medication was not working, but when she stopped it, her tremor got much worse.  She wanted to go back on the medication.  Her primidone  was restarted at 50 mg twice per day.  She reports today that its not helping.    Separately, she has been following with pain management at [facility].  She was seen January 27.  This was primarily for fibromyalgia and left hip pain.  She was started on diclofenac  as needed, started Lyrica 50 mg twice a day and told to go ahead and stop the tramadol.  Current medications: Primidone , 50 mg twice per day   Prior meds:  primidone , 50 mg bid; propranolol , 20 mg bid   ALLERGIES:   Allergies  Allergen Reactions   Codeine Other (See Comments)     Kept awake   Penicillin G Other (See Comments)   Penicillins Hives    Did it involve swelling of the face/tongue/throat, SOB, or low BP? N Did it involve sudden or severe rash/hives, skin peeling, or any reaction on the inside of your mouth or nose? Y Did you need to seek medical attention at a hospital or doctor's office? N When did it last happen?   Over 10 Years Ago    If all above answers are "NO", may proceed with cephalosporin use.     CURRENT MEDICATIONS:  Outpatient Encounter Medications as of 07/10/2023  Medication Sig   acetaminophen  (TYLENOL ) 325 MG tablet Take 650 mg by mouth 2 (two) times daily as needed (pain).   Cholecalciferol (VITAMIN D -3 PO) Take 1 tablet by mouth daily.   diclofenac  Sodium (VOLTAREN ) 1 % GEL Add'l Sig Add'l Sig topical Add'l Sig   DULoxetine (CYMBALTA) 30 MG capsule    Ergocalciferol  62.5 MCG (2500 UT) CAPS 1 po daily x 3 weeks and then weekly x 16 weeks   furosemide (LASIX) 40 MG tablet Take 40 mg by mouth daily.   hydroxychloroquine (PLAQUENIL) 200 MG tablet Take 200 mg by mouth daily.    hydrOXYzine (ATARAX/VISTARIL) 10 MG tablet Take 10 mg by mouth every 6 (six) hours as  needed for itching or anxiety.   levothyroxine (SYNTHROID) 100 MCG tablet Take 100 mcg by mouth daily.   Multiple Vitamins-Minerals (MULTIVITAMIN PO) Take 1 tablet by mouth daily.    omeprazole (PRILOSEC) 40 MG capsule Take 40 mg by mouth daily.    primidone  (MYSOLINE ) 50 MG tablet TAKE 1 TABLET TWICE DAILY   simvastatin (ZOCOR) 20 MG tablet Take 20 mg by mouth every evening.    triamcinolone  cream (KENALOG ) 0.1 % Apply 1 Application topically 2 (two) times daily.   [DISCONTINUED] estradiol (ESTRACE) 0.5 MG tablet    No facility-administered encounter medications on file as of 07/10/2023.     Objective:    PHYSICAL EXAMINATION:    VITALS:   Vitals:   07/10/23 1304  BP: (!) 156/58  Pulse: (!) 48  SpO2: 94%  Weight: 197 lb (89.4 kg)  Height: 5' 6 (1.676 m)     GEN:   The patient appears stated age and is in NAD. HEENT:  Normocephalic, atraumatic.  The mucous membranes are moist. The superficial temporal arteries are without ropiness or tenderness. CV:  [place].  In bigeminy Lungs:  CTAB   Neurological examination:  Orientation: The patient is alert and oriented x3. Cranial nerves: There is good facial symmetry. The speech is fluent and clear. Soft palate rises symmetrically and there is no tongue deviation. Hearing is intact to conversational tone. Sensation: Sensation is intact to light touch throughout Motor: Strength is at least antigravity x4.  Movement examination: Tone: There is normal tone in the bilateral upper extremities.  The tone in the lower extremities is normal.  Abnormal movements: There is no rest tremor, even with distraction procedures.  No postural tremor.  She has no intention tremor.  Archimedes spirals are drawn fairly well.  This is similar to last visit Coordination:  There is no decremation with RAM's, with any form of RAMS, including alternating supination and pronation of the forearm, hand opening and closing, finger taps, heel taps and toe taps Gait/station:  ambulates with cane and is somewhat antalgic    Cc:  Rexanne Ingle, MD

## 2023-07-05 NOTE — Telephone Encounter (Signed)
Pt called stating Dr August Saucer was to send in referral for physical therapy. Please call pt at [phone]

## 2023-07-06 ENCOUNTER — Other Ambulatory Visit: Payer: Self-pay

## 2023-07-06 DIAGNOSIS — M25552 Pain in left hip: Secondary | ICD-10-CM

## 2023-07-06 DIAGNOSIS — M5416 Radiculopathy, lumbar region: Secondary | ICD-10-CM

## 2023-07-06 NOTE — Telephone Encounter (Signed)
Yeah that's good by me

## 2023-07-06 NOTE — Telephone Encounter (Signed)
Nothing mentioned in last note about PT. Ok for PT referral to upstairs?

## 2023-07-10 ENCOUNTER — Encounter: Payer: Self-pay | Admitting: Neurology

## 2023-07-10 ENCOUNTER — Ambulatory Visit (INDEPENDENT_AMBULATORY_CARE_PROVIDER_SITE_OTHER): Payer: Medicare HMO | Admitting: Neurology

## 2023-07-10 VITALS — BP 156/58 | HR 48 | Ht 66.0 in | Wt 197.0 lb

## 2023-07-10 DIAGNOSIS — R251 Tremor, unspecified: Secondary | ICD-10-CM

## 2023-07-10 DIAGNOSIS — R001 Bradycardia, unspecified: Secondary | ICD-10-CM | POA: Diagnosis not present

## 2023-07-10 NOTE — Patient Instructions (Signed)
Make an appointment with Renford Dills, MD to discuss your heart rate

## 2023-07-11 DIAGNOSIS — R946 Abnormal results of thyroid function studies: Secondary | ICD-10-CM | POA: Diagnosis not present

## 2023-07-13 ENCOUNTER — Ambulatory Visit (INDEPENDENT_AMBULATORY_CARE_PROVIDER_SITE_OTHER): Payer: Medicare HMO | Admitting: Physical Therapy

## 2023-07-13 ENCOUNTER — Encounter: Payer: Self-pay | Admitting: Physical Therapy

## 2023-07-13 DIAGNOSIS — M79604 Pain in right leg: Secondary | ICD-10-CM | POA: Diagnosis not present

## 2023-07-13 DIAGNOSIS — R2681 Unsteadiness on feet: Secondary | ICD-10-CM

## 2023-07-13 DIAGNOSIS — M6281 Muscle weakness (generalized): Secondary | ICD-10-CM | POA: Diagnosis not present

## 2023-07-13 DIAGNOSIS — R262 Difficulty in walking, not elsewhere classified: Secondary | ICD-10-CM

## 2023-07-13 DIAGNOSIS — M5459 Other low back pain: Secondary | ICD-10-CM | POA: Diagnosis not present

## 2023-07-13 DIAGNOSIS — R293 Abnormal posture: Secondary | ICD-10-CM | POA: Diagnosis not present

## 2023-07-13 NOTE — Therapy (Signed)
 OUTPATIENT PHYSICAL THERAPY THORACOLUMBAR EVALUATION   Patient Name: Barbara Patton MRN: [id number] DOB:September 23, 1940, 83 y.o., female Today's Date: 07/13/2023   PT End of Session - 07/13/23 1456     Visit Number 1    Number of Visits 7    Date for PT Re-Evaluation 08/24/23    Authorization Type [employer]    Authorization Time Period 07/13/23 to 08/24/23    Progress Note Due on Visit 10    PT Start Time 1434    PT Stop Time 1501   pt with high pain levels unable to tolerate   PT Time Calculation (min) 27 min    Activity Tolerance Patient tolerated treatment well;Patient limited by pain    Behavior During Therapy [facility] for tasks assessed/performed               Past Medical History:  Diagnosis Date   Glaucoma    High cholesterol    Hypertension    Lupus    Thyroid  disease    Past Surgical History:  Procedure Laterality Date   ABDOMINAL HYSTERECTOMY     CATARACT EXTRACTION, BILATERAL     TOTAL KNEE ARTHROPLASTY Bilateral    TUBAL LIGATION     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   Menopausal symptom 10/20/2021   Overweight 10/20/2021   History of colonic polyps 10/20/2021   Urinary incontinence 10/20/2021   Globus pharyngeus 05/23/2021   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   Referred otalgia of left ear 08/23/2015   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   Allergic rhinitis 07/26/2015   Anxiety 07/26/2015   Benign essential hypertension 07/26/2015   Dysfunction of left eustachian tube 07/26/2015   Esophageal reflux 07/26/2015   Fibromyalgia 07/26/2015   Pure hypercholesterolemia 07/26/2015   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 05/03/2014   Pes anserinus bursitis of left knee 12/04/2013   Trochanteric  bursitis of left hip 08/07/2013   Leg swelling 12/06/2011   Glaucoma 11/02/2011   HLD (hyperlipidemia) 11/02/2011   HTN (hypertension) 11/02/2011   Hypothyroid 11/02/2011   OA (osteoarthritis) 11/02/2011   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 I'd been doing OK since then, I'd been having a lot of pain in the L side  and in the right side it was feeling like there was a knife or something cutting into me. That pain was coming when I was sitting down and getting ready to get up. Today when  I was in the car it started and wouldn't stop, my daughter had to help me get inside.  [facility], I hadn't kept up with any exercises from therapy, I have them at home.   PERTINENT HISTORY:  See above   PAIN:  Are you having pain? Yes: NPRS scale: 10/10 but calm and joking with PT Pain location: R leg  Pain description: side of R LE  Aggravating factors: moving R LE in general, transfers   Relieving factors: none known   PRECAUTIONS: None  RED FLAGS: None   WEIGHT BEARING RESTRICTIONS: No  FALLS:  Has patient fallen in last 6 months? No  LIVING ENVIRONMENT: Lives with: lives with their daughter Lives  in: House/apartment Stairs: no steps  Has following equipment at home: Single point cane and walker but I don't know if it has 2 wheels or 4   OCCUPATION: retired   PLOF: Independent, Independent with basic ADLs, Independent with gait, and Independent with transfers  PATIENT GOALS: to not have any pain   NEXT MD VISIT: not scheduled/PRN   OBJECTIVE:  Note: Objective measures were completed at Evaluation unless otherwise noted.  DIAGNOSTIC FINDINGS:   FINDINGS: No evidence for an acute fracture in the bony pelvis. SI joints and symphysis pubis unremarkable. Joint space in the right hip is relatively well preserved. There is loss of joint space in the left hip with degenerative spurring. No evidence for left femoral neck fracture.   IMPRESSION: 1. No acute bony findings. 2. Degenerative changes in the left hip.    CLINICAL DATA:  Left leg pain, denies injury or fall   EXAM: LEFT FEMUR PORTABLE 1 VIEW   COMPARISON:  10/01/2022   FINDINGS: No acute fracture or dislocation. Moderate degenerative arthritis left hip. Partially visualized left TKA.   IMPRESSION: No acute fracture or dislocation. Moderate degenerative arthritis left hip.  AP, frog lateral view of left hip reviewed.  Moderate degenerative changes  noted of the left hip joint.  No fracture or dislocation.  EXAM: MRI LUMBAR SPINE WITHOUT CONTRAST   TECHNIQUE: Multiplanar, multisequence MR imaging of the lumbar spine was performed. No intravenous contrast was administered.   COMPARISON:  Radiographs 10/01/2022 and 07/31/2022.  MRI 12/04/2019.   FINDINGS: Segmentation: Conventional anatomy assumed, with the last open disc space designated L5-S1.Concordant with prior imaging.   Alignment: Grade 1 anterolisthesis at L3-4 and L4-5, similar to previous MRI.   Vertebrae: There are mild superior endplate compression fractures at the L3 and L4 levels, resulting in approximately 10% loss of vertebral body  height. There is associated bone marrow edema, but no osseous retropulsion or definite involvement of the posterior elements. These fractures are not clearly seen on recent or prior radiographs. No other acute osseous findings. The visualized sacroiliac joints appear unremarkable.   Conus medullaris: Extends to the L1-2 level and appears normal.   Paraspinal and other soft tissues: No acute paraspinal findings. The urinary bladder is noted to be distended and moderately trabeculated. 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   Disc levels:   Sagittal images demonstrate no  significant disc space findings within the visualized lower thoracic spine. There is mild disc bulging at T11-12 and T12-L1.   L1-2: Preserved disc height. Mild facet hypertrophy. No spinal stenosis or nerve root encroachment.   L2-3: Preserved disc height with mild disc bulging and mild-to-moderate facet hypertrophy. No spinal stenosis or nerve root encroachment.   L3-4: Moderate to severe facet hypertrophy accounts for the grade 1 anterolisthesis and contributes to annular disc bulging and uncovering. There is resulting moderate multifactorial spinal stenosis with mild lateral recess and minimal foraminal narrowing bilaterally. The spinal stenosis has progressed from the previous MRI   L4-5: Moderate to severe facet hypertrophy accounts for the grade 1 anterolisthesis and contributes to annular disc bulging and uncovering. There is only resulting borderline spinal stenosis at this level with minimal left lateral recess and left foraminal narrowing. No nerve root encroachment. No significant changes are seen at this level.   L5-S1: Chronic disc bulging with endplate osteophytes asymmetric to the left. Mild facet hypertrophy. No spinal stenosis or nerve root encroachment.   IMPRESSION: 1. Acute/subacute mild superior endplate compression fractures at L3 and L4 with associated bone marrow edema,  but no osseous retropulsion. 2. 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 3. Borderline spinal stenosis at L4-5 secondary to facet hypertrophy and a grade 1 anterolisthesis. 4. Distended urinary bladder with trabeculation, suggesting chronic bladder outlet obstruction.  Three-view radiographs of her right knee were reviewed today she is status  post total right knee arthroplasty components remain in stable position  when compared to previous x-rays.  No evidence of any periprosthetic  Fracture  THORACIC SPINE 2 VIEWS   COMPARISON:  None Available.   FINDINGS: No acute fracture or traumatic malalignment. Degenerative disc narrowing with endplate spurring in the mid and lower thoracic spine. Maintained posterior mediastinal fat planes. Mild thoracic levocurvature.   IMPRESSION: Degenerative changes without acute or focal finding.      PATIENT SURVEYS:  FOTO 45, predicted 64 in 9 visits   COGNITION: Overall cognitive status: Within functional limits for tasks assessed     SENSATION: Not tested  MUSCLE LENGTH:   HS mild limitation B (tested in sitting) Piriformis severe limitation B   POSTURE: rounded shoulders, forward head, decreased lumbar lordosis, and increased thoracic kyphosis    LUMBAR ROM:   AROM eval  Flexion   Extension   Right lateral flexion   Left lateral flexion   Right rotation   Left rotation    (Blank rows = not tested)   LOWER EXTREMITY MMT:    MMT Right eval Left eval  Hip flexion 3- 3-  Hip extension    Hip abduction    Hip adduction    Hip internal rotation    Hip external rotation    Knee flexion 4 4  Knee extension 4+ 4+  Ankle dorsiflexion    Ankle plantarflexion    Ankle inversion    Ankle eversion     (Blank rows = not tested)   TREATMENT DATE:   07/13/23  Eval, education that she has had skilled PT services multiple times in the past year with virtually no  improvement in her pain/symptoms/function, best option right now would be a trial of full water PT (instead of split land-water) to see if this helps her to improve, otherwise would need to return to MD for ongoing medical management  Hot back to R thigh during education  PATIENT EDUCATION:  Education details: see above  Person educated: Patient Education method: Explanation, Demonstration, and Handouts Education comprehension: verbalized understanding, returned demonstration, and needs further education  HOME EXERCISE PROGRAM:  TBD  ASSESSMENT:  CLINICAL IMPRESSION: Patient is a 83 y.o. F who was seen today for physical therapy evaluation and treatment for Diagnosis M25.552 (ICD-10-CM) - Pain in left hip M54.16 (ICD-10-CM) - Lumbar radiculopathy. She is extremely well known to this PT clinic and received multiple bouts of skilled physical therapy in the past without significant improvement objectively or functionally. She has not been compliant with HEP from prior PT POCs, but does try to stay active on the [employer] [facility] PT did a thorough review of her past course of skilled PT care, I think that at this point we have tried all reasonable interventions with land based therapies without significant improvement- at this point we are happy to try water PT but will need to refer back to MD if her symptoms and objective measures fail to improve.    OBJECTIVE IMPAIRMENTS: Abnormal gait, difficulty walking, decreased ROM, decreased strength, increased fascial restrictions, increased muscle spasms, impaired flexibility, impaired sensation, improper body mechanics, postural dysfunction, obesity, and pain.   ACTIVITY LIMITATIONS: carrying, lifting, sitting, standing, transfers, bed mobility, and locomotion level  PARTICIPATION LIMITATIONS: meal prep,  cleaning, laundry, driving, shopping, community activity, and occupation  PERSONAL FACTORS: Age, Behavior pattern, Education, Past/current experiences, Social background, and Time since onset of injury/illness/exacerbation are also affecting patient's functional outcome.   REHAB POTENTIAL: Poor pain/sx did not respond to multiple past courses of  skilled land based PT services   CLINICAL DECISION MAKING: Evolving/moderate complexity  EVALUATION COMPLEXITY: Moderate   GOALS: Goals reviewed with patient? No  SHORT TERM GOALS: Target date: STGs = LTGs     LONG TERM GOALS: Target date: 08/24/2023    MMT to have improved by one grade in all weak groups  Baseline:  Goal status: INITIAL  2.  Pain to be no more than 6/10 at worst  Baseline:  Goal status: INITIAL  3.  Will demonstrate good biomechanics for bed mobility and floor to waist lifting  Baseline:  Goal status: INITIAL  4.  General hip flexibility to be no more than Moderately limited  Baseline:  Goal status: INITIAL    PLAN:  PT FREQUENCY: 1-2x/week  PT DURATION: 6 weeks  PLANNED INTERVENTIONS: 97164- PT Re-evaluation, 97110-Therapeutic exercises, 97530- Therapeutic activity, [id number]- Neuromuscular re-education, 97535- Self Care, [id number]- Manual therapy, [id number]- Aquatic Therapy, and Dry Needling.  PLAN FOR NEXT SESSION: water therapy full time for 4 weeks then transition to land. DC back to MD if she shows limited objective progress   Josette Rough, PT, DPT 07/13/23 3:38 PM   Referring diagnosis? M25.552, M54. 16 Treatment diagnosis? (if different than referring diagnosis) M79.604, M62.81, R26.2, R26.81, M54.59, R29.3 What was this (referring dx) caused by? []  Surgery []  Fall [x]  Ongoing issue []  Arthritis []  Other: ____________  Laterality: []  Rt []  Lt [x]  Both  Check all possible CPT codes:  *CHOOSE 10 OR LESS*    See Planned Interventions listed in the Plan section of the Evaluation.

## 2023-07-20 ENCOUNTER — Ambulatory Visit: Payer: Medicare HMO | Admitting: Physical Therapy

## 2023-07-26 ENCOUNTER — Telehealth: Payer: Self-pay | Admitting: Neurology

## 2023-07-26 NOTE — Telephone Encounter (Signed)
 Pt. Is having memory loss issues and would like to set up a memory test and or be evaluated, please contact pt back

## 2023-07-31 DIAGNOSIS — H903 Sensorineural hearing loss, bilateral: Secondary | ICD-10-CM | POA: Diagnosis not present

## 2023-08-01 DIAGNOSIS — R208 Other disturbances of skin sensation: Secondary | ICD-10-CM | POA: Diagnosis not present

## 2023-08-01 DIAGNOSIS — R600 Localized edema: Secondary | ICD-10-CM | POA: Diagnosis not present

## 2023-08-01 DIAGNOSIS — I10 Essential (primary) hypertension: Secondary | ICD-10-CM | POA: Diagnosis not present

## 2023-08-01 DIAGNOSIS — G8929 Other chronic pain: Secondary | ICD-10-CM | POA: Diagnosis not present

## 2023-08-03 ENCOUNTER — Ambulatory Visit: Payer: Medicare HMO | Admitting: Physical Therapy

## 2023-08-03 ENCOUNTER — Encounter: Payer: Self-pay | Admitting: Physical Therapy

## 2023-08-03 DIAGNOSIS — M5459 Other low back pain: Secondary | ICD-10-CM | POA: Diagnosis not present

## 2023-08-03 DIAGNOSIS — M6281 Muscle weakness (generalized): Secondary | ICD-10-CM | POA: Diagnosis not present

## 2023-08-03 DIAGNOSIS — M79604 Pain in right leg: Secondary | ICD-10-CM | POA: Diagnosis not present

## 2023-08-03 DIAGNOSIS — R262 Difficulty in walking, not elsewhere classified: Secondary | ICD-10-CM

## 2023-08-03 DIAGNOSIS — R2681 Unsteadiness on feet: Secondary | ICD-10-CM

## 2023-08-03 NOTE — Therapy (Signed)
 OUTPATIENT PHYSICAL THERAPY TREATMENT   Patient Name: Barbara Patton MRN: [id number] DOB:1941-01-03, 83 y.o., female Today's Date: 08/03/2023   PT End of Session - 08/03/23 1053     Visit Number 2    Number of Visits 7    Date for PT Re-Evaluation 08/24/23    Authorization Type [employer]    Authorization Time Period 07/13/23 to 08/24/23    Progress Note Due on Visit 10    PT Start Time 1100    PT Stop Time 1145    PT Time Calculation (min) 45 min    Activity Tolerance Patient tolerated treatment well    Behavior During Therapy [facility] for tasks assessed/performed               Past Medical History:  Diagnosis Date   Glaucoma    High cholesterol    Hypertension    Lupus    Thyroid disease    Past Surgical History:  Procedure Laterality Date   ABDOMINAL HYSTERECTOMY     CATARACT EXTRACTION, BILATERAL     TOTAL KNEE ARTHROPLASTY Bilateral    TUBAL LIGATION     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   Menopausal symptom 10/20/2021   Overweight 10/20/2021   History of colonic polyps 10/20/2021   Urinary incontinence 10/20/2021   Globus pharyngeus 05/23/2021   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   Referred otalgia of left ear 08/23/2015   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   Allergic rhinitis 07/26/2015   Anxiety 07/26/2015   Benign essential hypertension 07/26/2015   Dysfunction of left eustachian tube 07/26/2015   Esophageal reflux 07/26/2015   Fibromyalgia 07/26/2015   Pure hypercholesterolemia 07/26/2015   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 05/03/2014   Pes anserinus bursitis of left knee 12/04/2013   Trochanteric bursitis of left hip 08/07/2013   Leg swelling 12/06/2011   Glaucoma 11/02/2011   HLD  (hyperlipidemia) 11/02/2011   HTN (hypertension) 11/02/2011   Hypothyroid 11/02/2011   OA (osteoarthritis) 11/02/2011   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) 03/16/2011   Presence of artificial knee joint 03/16/2011    PCP: Renford Dills MD   REFERRING PROVIDER: Julieanne Cotton, PA-C  REFERRING DIAG: Diagnosis M25.552 (ICD-10-CM) - Pain in left hip M54.16 (ICD-10-CM) - Lumbar radiculopathy  Rationale for Evaluation and Treatment: Rehabilitation  THERAPY DIAG:  Pain in right leg  Muscle weakness (generalized)  Difficulty in walking, not elsewhere classified  Unsteadiness on feet  Other low back pain  ONSET DATE: chronic   SUBJECTIVE:  SUBJECTIVE STATEMENT: She says her left leg is heavy and swollen, hard to get in/out of car, having a lot of back pain today   PERTINENT HISTORY:  See above   PAIN:  Are you having pain? Yes: NPRS scale: 6/10  Pain location: R leg  Pain description: side of R LE  Aggravating factors: moving R LE in general, transfers   Relieving factors: none known   PRECAUTIONS: None  RED FLAGS: None   WEIGHT BEARING RESTRICTIONS: No  FALLS:  Has patient fallen in last 6 months? No  LIVING ENVIRONMENT: Lives with: lives with their daughter Lives in: House/apartment Stairs: no steps  Has following equipment at home: Single point cane and "walker but I don't know if it has 2 wheels or 4"   OCCUPATION: retired   PLOF: Independent, Independent with basic ADLs, Independent with gait, and Independent with transfers  PATIENT GOALS: to not have any pain   NEXT MD VISIT: not scheduled/PRN   OBJECTIVE:  Note: Objective measures were completed at Evaluation unless otherwise noted.  DIAGNOSTIC FINDINGS:   FINDINGS: No evidence for an acute fracture  in the bony pelvis. SI joints and symphysis pubis unremarkable. Joint space in the right hip is relatively well preserved. There is loss of joint space in the left hip with degenerative spurring. No evidence for left femoral neck fracture.   IMPRESSION: 1. No acute bony findings. 2. Degenerative changes in the left hip.    CLINICAL DATA:  Left leg pain, denies injury or fall   EXAM: LEFT FEMUR PORTABLE 1 VIEW   COMPARISON:  10/01/2022   FINDINGS: No acute fracture or dislocation. Moderate degenerative arthritis left hip. Partially visualized left TKA.   IMPRESSION: No acute fracture or dislocation. Moderate degenerative arthritis left hip.  AP, frog lateral view of left hip reviewed.  Moderate degenerative changes  noted of the left hip joint.  No fracture or dislocation.  EXAM: MRI LUMBAR SPINE WITHOUT CONTRAST   TECHNIQUE: Multiplanar, multisequence MR imaging of the lumbar spine was performed. No intravenous contrast was administered.   COMPARISON:  Radiographs 10/01/2022 and 07/31/2022.  MRI 12/04/2019.   FINDINGS: Segmentation: Conventional anatomy assumed, with the last open disc space designated L5-S1.Concordant with prior imaging.   Alignment: Grade 1 anterolisthesis at L3-4 and L4-5, similar to previous MRI.   Vertebrae: There are mild superior endplate compression fractures at the L3 and L4 levels, resulting in approximately 10% loss of vertebral body height. There is associated bone marrow edema, but no osseous retropulsion or definite involvement of the posterior elements. These fractures are not clearly seen on recent or prior radiographs. No other acute osseous findings. The visualized sacroiliac joints appear unremarkable.   Conus medullaris: Extends to the L1-2 level and appears normal.   Paraspinal and other soft tissues: No acute paraspinal findings. The urinary bladder is noted to be distended and moderately trabeculated. Chronic atrophy of  the erector spinae musculature.   Disc levels:   Sagittal images demonstrate no significant disc space findings within the visualized lower thoracic spine. There is mild disc bulging at T11-12 and T12-L1.   L1-2: Preserved disc height. Mild facet hypertrophy. No spinal stenosis or nerve root encroachment.   L2-3: Preserved disc height with mild disc bulging and mild-to-moderate facet hypertrophy. No spinal stenosis or nerve root encroachment.   L3-4: Moderate to severe facet hypertrophy accounts for the grade 1 anterolisthesis and contributes to annular disc bulging and uncovering. There is resulting moderate multifactorial spinal stenosis with mild  lateral recess and minimal foraminal narrowing bilaterally. The spinal stenosis has progressed from the previous MRI   L4-5: Moderate to severe facet hypertrophy accounts for the grade 1 anterolisthesis and contributes to annular disc bulging and uncovering. There is only resulting borderline spinal stenosis at this level with minimal left lateral recess and left foraminal narrowing. No nerve root encroachment. No significant changes are seen at this level.   L5-S1: Chronic disc bulging with endplate osteophytes asymmetric to the left. Mild facet hypertrophy. No spinal stenosis or nerve root encroachment.   IMPRESSION: 1. Acute/subacute mild superior endplate compression fractures at L3 and L4 with associated bone marrow edema, but no osseous retropulsion. 2. 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 3. Borderline spinal stenosis at L4-5 secondary to facet hypertrophy and a grade 1 anterolisthesis. 4. Distended urinary bladder with trabeculation, suggesting chronic bladder outlet obstruction.  Three-view radiographs of her right knee were reviewed today she is status  post total right knee arthroplasty components remain in stable position  when compared to previous x-rays.   No evidence of any periprosthetic  Fracture  THORACIC SPINE 2 VIEWS   COMPARISON:  None Available.   FINDINGS: No acute fracture or traumatic malalignment. Degenerative disc narrowing with endplate spurring in the mid and lower thoracic spine. Maintained posterior mediastinal fat planes. Mild thoracic levocurvature.   IMPRESSION: Degenerative changes without acute or focal finding.      PATIENT SURVEYS:  FOTO 45, predicted 64 in 9 visits   COGNITION: Overall cognitive status: Within functional limits for tasks assessed     SENSATION: Not tested  MUSCLE LENGTH:   HS mild limitation B (tested in sitting) Piriformis severe limitation B   POSTURE: rounded shoulders, forward head, decreased lumbar lordosis, and increased thoracic kyphosis    LUMBAR ROM:   AROM eval  Flexion   Extension   Right lateral flexion   Left lateral flexion   Right rotation   Left rotation    (Blank rows = not tested)   LOWER EXTREMITY MMT:    MMT Right eval Left eval  Hip flexion 3- 3-  Hip extension    Hip abduction    Hip adduction    Hip internal rotation    Hip external rotation    Knee flexion 4 4  Knee extension 4+ 4+  Ankle dorsiflexion    Ankle plantarflexion    Ankle inversion    Ankle eversion     (Blank rows = not tested)   TREATMENT DATE:  08/03/23 Pt seen for aquatic therapy today.  Treatment took place in water 3.5-4.75 ft in depth at the [facility] pool. Temp of water was 91.  Pt entered/exited the pool via stairs with  rail.   Pt requires the buoyancy and hydrostatic pressure of water for support, and to offload joints by unweighting joint load by at least 50 % in navel deep water and by at least 75-80% in chest to neck deep water.  Viscosity of the water is needed for resistance of strengthening. Water current perturbations provides challenge to standing balance requiring increased core activation. Aquatic exercises Lateral walking, forward  walking, retro walking 3 round trips without UE support Leg swings add/abd X 20 bilat with UE support cues for slow and controlled movement Leg swings flexion/extension X 20 bilat with UE support cues for slow and controlled movements Hip circles X 15 CW,CCW bilat Standing arm rotation circles/stir the pot with dumbells X15 bilat Standing shoulder extensions  with water dumbell X 15 bilat Sit to stand  X20 from pool bench Lumbar L stretch holding on at pool wall 5 sec X 10 Floating on pool noodle with cues for upright posture, 5 min for spinal decompression and LE ROM performing cycling  07/13/23  Eval, education that she has had skilled PT services multiple times in the past year with virtually no improvement in her pain/symptoms/function, best option right now would be a trial of full water PT (instead of split land-water) to see if this helps her to improve, otherwise would need to return to MD for ongoing medical management  Hot back to R thigh during education                                                                                                                                  PATIENT EDUCATION:  Education details: see above  Person educated: Patient Education method: Explanation, Demonstration, and Handouts Education comprehension: verbalized understanding, returned demonstration, and needs further education  HOME EXERCISE PROGRAM:  TBD  ASSESSMENT:  CLINICAL IMPRESSION: She had first aquatic PT session this episode of care and did do well with this. We will see how she responds to a few more sessions of this.    Per eval Patient is a 83 y.o. F who was seen today for physical therapy evaluation and treatment for Diagnosis M25.552 (ICD-10-CM) - Pain in left hip M54.16 (ICD-10-CM) - Lumbar radiculopathy. She is extremely well known to this PT clinic and received multiple bouts of skilled physical therapy in the past without significant improvement objectively or  functionally. She has not been compliant with HEP from prior PT POCs, but does try to stay active on the [employer] and [facility] PT did a thorough review of her past course of skilled PT care, I think that at this point we have tried all reasonable interventions with land based therapies without significant improvement- at this point we are happy to try water PT but will need to refer back to MD if her symptoms and objective measures fail to improve.    OBJECTIVE IMPAIRMENTS: Abnormal gait, difficulty walking, decreased ROM, decreased strength, increased fascial restrictions, increased muscle spasms, impaired flexibility, impaired sensation, improper body mechanics, postural dysfunction, obesity, and pain.   ACTIVITY LIMITATIONS: carrying, lifting, sitting, standing, transfers, bed mobility, and locomotion level  PARTICIPATION LIMITATIONS: meal prep, cleaning, laundry, driving, shopping, community activity, and occupation  PERSONAL FACTORS: Age, Behavior pattern, Education, Past/current experiences, Social background, and Time since onset of injury/illness/exacerbation are also affecting patient's functional outcome.   REHAB POTENTIAL: Poor pain/sx did not respond to multiple past courses of  skilled land based PT services   CLINICAL DECISION MAKING: Evolving/moderate complexity  EVALUATION COMPLEXITY: Moderate   GOALS: Goals reviewed with patient? No  SHORT TERM GOALS: Target date: STGs = LTGs     LONG TERM GOALS: Target date: 08/24/2023    MMT to  have improved by one grade in all weak groups  Baseline:  Goal status: INITIAL  2.  Pain to be no more than 6/10 at worst  Baseline:  Goal status: INITIAL  3.  Will demonstrate good biomechanics for bed mobility and floor to waist lifting  Baseline:  Goal status: INITIAL  4.  General hip flexibility to be no more than Moderately limited  Baseline:  Goal status: INITIAL    PLAN:  PT FREQUENCY: 1-2x/week  PT DURATION: 6  weeks  PLANNED INTERVENTIONS: 97164- PT Re-evaluation, 97110-Therapeutic exercises, 97530- Therapeutic activity, [id number]- Neuromuscular re-education, 97535- Self Care, [id number]- Manual therapy, [id number]- Aquatic Therapy, and Dry Needling.  PLAN FOR NEXT SESSION: water therapy full time for 4 weeks then transition to land if able  Ivery Quale, PT, DPT 08/03/23 10:54 AM    Referring diagnosis? M25.552, M54. 16 Treatment diagnosis? (if different than referring diagnosis) M79.604, M62.81, R26.2, R26.81, M54.59, R29.3 What was this (referring dx) caused by? []  Surgery []  Fall [x]  Ongoing issue []  Arthritis []  Other: ____________  Laterality: []  Rt []  Lt [x]  Both  Check all possible CPT codes:  *CHOOSE 10 OR LESS*    See Planned Interventions listed in the Plan section of the Evaluation.

## 2023-08-10 ENCOUNTER — Encounter: Payer: Self-pay | Admitting: Physical Therapy

## 2023-08-10 ENCOUNTER — Ambulatory Visit: Payer: Medicare HMO | Admitting: Physical Therapy

## 2023-08-10 DIAGNOSIS — M6281 Muscle weakness (generalized): Secondary | ICD-10-CM | POA: Diagnosis not present

## 2023-08-10 DIAGNOSIS — M79604 Pain in right leg: Secondary | ICD-10-CM | POA: Diagnosis not present

## 2023-08-10 DIAGNOSIS — R262 Difficulty in walking, not elsewhere classified: Secondary | ICD-10-CM | POA: Diagnosis not present

## 2023-08-10 DIAGNOSIS — M5459 Other low back pain: Secondary | ICD-10-CM

## 2023-08-10 DIAGNOSIS — R2681 Unsteadiness on feet: Secondary | ICD-10-CM

## 2023-08-10 NOTE — Therapy (Signed)
 OUTPATIENT PHYSICAL THERAPY TREATMENT   Patient Name: Barbara Patton MRN: [id number] DOB:05-19-1941, 83 y.o., female Today's Date: 08/10/2023   PT End of Session - 08/10/23 1124     Visit Number 3    Number of Visits 7    Date for PT Re-Evaluation 08/24/23    Authorization Type [employer]    Authorization Time Period 07/13/23 to 08/24/23    Progress Note Due on Visit 10    PT Start Time 1115    PT Stop Time 1153    PT Time Calculation (min) 38 min    Activity Tolerance Patient tolerated treatment well    Behavior During Therapy [facility] for tasks assessed/performed               Past Medical History:  Diagnosis Date   Glaucoma    High cholesterol    Hypertension    Lupus    Thyroid disease    Past Surgical History:  Procedure Laterality Date   ABDOMINAL HYSTERECTOMY     CATARACT EXTRACTION, BILATERAL     TOTAL KNEE ARTHROPLASTY Bilateral    TUBAL LIGATION     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   Menopausal symptom 10/20/2021   Overweight 10/20/2021   History of colonic polyps 10/20/2021   Urinary incontinence 10/20/2021   Globus pharyngeus 05/23/2021   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   Referred otalgia of left ear 08/23/2015   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   Allergic rhinitis 07/26/2015   Anxiety 07/26/2015   Benign essential hypertension 07/26/2015   Dysfunction of left eustachian tube 07/26/2015   Esophageal reflux 07/26/2015   Fibromyalgia 07/26/2015   Pure hypercholesterolemia 07/26/2015   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 05/03/2014   Pes anserinus bursitis of left knee 12/04/2013   Trochanteric bursitis of left hip 08/07/2013   Leg swelling 12/06/2011   Glaucoma 11/02/2011   HLD  (hyperlipidemia) 11/02/2011   HTN (hypertension) 11/02/2011   Hypothyroid 11/02/2011   OA (osteoarthritis) 11/02/2011   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) 03/16/2011   Presence of artificial knee joint 03/16/2011    PCP: Renford Dills MD   REFERRING PROVIDER: Julieanne Cotton, PA-C  REFERRING DIAG: Diagnosis M25.552 (ICD-10-CM) - Pain in left hip M54.16 (ICD-10-CM) - Lumbar radiculopathy  Rationale for Evaluation and Treatment: Rehabilitation  THERAPY DIAG:  Pain in right leg  Muscle weakness (generalized)  Difficulty in walking, not elsewhere classified  Unsteadiness on feet  Other low back pain  ONSET DATE: chronic   SUBJECTIVE:  SUBJECTIVE STATEMENT: She says pain in back and down Rt leg. She is a little winded from rushing to get to PT appointment  PERTINENT HISTORY:  See above   PAIN:  Are you having pain? Yes: NPRS scale: 6/10  Pain location: R leg  Pain description: side of R LE  Aggravating factors: moving R LE in general, transfers   Relieving factors: none known   PRECAUTIONS: None  RED FLAGS: None   WEIGHT BEARING RESTRICTIONS: No  FALLS:  Has patient fallen in last 6 months? No  LIVING ENVIRONMENT: Lives with: lives with their daughter Lives in: House/apartment Stairs: no steps  Has following equipment at home: Single point cane and "walker but I don't know if it has 2 wheels or 4"   OCCUPATION: retired   PLOF: Independent, Independent with basic ADLs, Independent with gait, and Independent with transfers  PATIENT GOALS: to not have any pain   NEXT MD VISIT: not scheduled/PRN   OBJECTIVE:  Note: Objective measures were completed at Evaluation unless otherwise noted.  DIAGNOSTIC FINDINGS:   FINDINGS: No evidence for an acute fracture in  the bony pelvis. SI joints and symphysis pubis unremarkable. Joint space in the right hip is relatively well preserved. There is loss of joint space in the left hip with degenerative spurring. No evidence for left femoral neck fracture.   IMPRESSION: 1. No acute bony findings. 2. Degenerative changes in the left hip.    CLINICAL DATA:  Left leg pain, denies injury or fall   EXAM: LEFT FEMUR PORTABLE 1 VIEW   COMPARISON:  10/01/2022   FINDINGS: No acute fracture or dislocation. Moderate degenerative arthritis left hip. Partially visualized left TKA.   IMPRESSION: No acute fracture or dislocation. Moderate degenerative arthritis left hip.  AP, frog lateral view of left hip reviewed.  Moderate degenerative changes  noted of the left hip joint.  No fracture or dislocation.  EXAM: MRI LUMBAR SPINE WITHOUT CONTRAST   TECHNIQUE: Multiplanar, multisequence MR imaging of the lumbar spine was performed. No intravenous contrast was administered.   COMPARISON:  Radiographs 10/01/2022 and 07/31/2022.  MRI 12/04/2019.   FINDINGS: Segmentation: Conventional anatomy assumed, with the last open disc space designated L5-S1.Concordant with prior imaging.   Alignment: Grade 1 anterolisthesis at L3-4 and L4-5, similar to previous MRI.   Vertebrae: There are mild superior endplate compression fractures at the L3 and L4 levels, resulting in approximately 10% loss of vertebral body height. There is associated bone marrow edema, but no osseous retropulsion or definite involvement of the posterior elements. These fractures are not clearly seen on recent or prior radiographs. No other acute osseous findings. The visualized sacroiliac joints appear unremarkable.   Conus medullaris: Extends to the L1-2 level and appears normal.   Paraspinal and other soft tissues: No acute paraspinal findings. The urinary bladder is noted to be distended and moderately trabeculated. Chronic atrophy of the  erector spinae musculature.   Disc levels:   Sagittal images demonstrate no significant disc space findings within the visualized lower thoracic spine. There is mild disc bulging at T11-12 and T12-L1.   L1-2: Preserved disc height. Mild facet hypertrophy. No spinal stenosis or nerve root encroachment.   L2-3: Preserved disc height with mild disc bulging and mild-to-moderate facet hypertrophy. No spinal stenosis or nerve root encroachment.   L3-4: Moderate to severe facet hypertrophy accounts for the grade 1 anterolisthesis and contributes to annular disc bulging and uncovering. There is resulting moderate multifactorial spinal stenosis with mild lateral recess  and minimal foraminal narrowing bilaterally. The spinal stenosis has progressed from the previous MRI   L4-5: Moderate to severe facet hypertrophy accounts for the grade 1 anterolisthesis and contributes to annular disc bulging and uncovering. There is only resulting borderline spinal stenosis at this level with minimal left lateral recess and left foraminal narrowing. No nerve root encroachment. No significant changes are seen at this level.   L5-S1: Chronic disc bulging with endplate osteophytes asymmetric to the left. Mild facet hypertrophy. No spinal stenosis or nerve root encroachment.   IMPRESSION: 1. Acute/subacute mild superior endplate compression fractures at L3 and L4 with associated bone marrow edema, but no osseous retropulsion. 2. 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 3. Borderline spinal stenosis at L4-5 secondary to facet hypertrophy and a grade 1 anterolisthesis. 4. Distended urinary bladder with trabeculation, suggesting chronic bladder outlet obstruction.  Three-view radiographs of her right knee were reviewed today she is status  post total right knee arthroplasty components remain in stable position  when compared to previous x-rays.  No  evidence of any periprosthetic  Fracture  THORACIC SPINE 2 VIEWS   COMPARISON:  None Available.   FINDINGS: No acute fracture or traumatic malalignment. Degenerative disc narrowing with endplate spurring in the mid and lower thoracic spine. Maintained posterior mediastinal fat planes. Mild thoracic levocurvature.   IMPRESSION: Degenerative changes without acute or focal finding.      PATIENT SURVEYS:  FOTO 45, predicted 64 in 9 visits   COGNITION: Overall cognitive status: Within functional limits for tasks assessed     SENSATION: Not tested  MUSCLE LENGTH:   HS mild limitation B (tested in sitting) Piriformis severe limitation B   POSTURE: rounded shoulders, forward head, decreased lumbar lordosis, and increased thoracic kyphosis    LUMBAR ROM:   AROM eval  Flexion   Extension   Right lateral flexion   Left lateral flexion   Right rotation   Left rotation    (Blank rows = not tested)   LOWER EXTREMITY MMT:    MMT Right eval Left eval  Hip flexion 3- 3-  Hip extension    Hip abduction    Hip adduction    Hip internal rotation    Hip external rotation    Knee flexion 4 4  Knee extension 4+ 4+  Ankle dorsiflexion    Ankle plantarflexion    Ankle inversion    Ankle eversion     (Blank rows = not tested)   TREATMENT DATE:  08/10/23 Pt seen for aquatic therapy today.  Treatment took place in water 3.5-4.75 ft in depth at the [facility] pool. Temp of water was 91.  Pt entered/exited the pool via stairs with  rail.   Pt requires the buoyancy and hydrostatic pressure of water for support, and to offload joints by unweighting joint load by at least 50 % in navel deep water and by at least 75-80% in chest to neck deep water.  Viscosity of the water is needed for resistance of strengthening. Water current perturbations provides challenge to standing balance requiring increased core activation. Aquatic exercises Lateral walking, forward walking,  retro walking 3 round trips without UE support Leg swings add/abd X 15 bilat with UE support cues for slow and controlled movement Leg swings flexion/extension X 15 bilat with UE support cues for slow and controlled movements Marches X 15 bilat Hip circles X 15 CW,CCW bilat Standing arm rotation circles/stir the pot with dumbells X15 bilat Standing  shoulder extensions with water dumbell X 15 bilat Sit to stand  X20 from pool bench Lumbar L stretch holding on at pool wall 5 sec X 10 Floating on pool noodle with cues for upright posture, 5 min for spinal decompression and LE ROM performing cycling  08/03/23 Pt seen for aquatic therapy today.  Treatment took place in water 3.5-4.75 ft in depth at the [facility] pool. Temp of water was 91.  Pt entered/exited the pool via stairs with  rail.   Pt requires the buoyancy and hydrostatic pressure of water for support, and to offload joints by unweighting joint load by at least 50 % in navel deep water and by at least 75-80% in chest to neck deep water.  Viscosity of the water is needed for resistance of strengthening. Water current perturbations provides challenge to standing balance requiring increased core activation. Aquatic exercises Lateral walking, forward walking, retro walking 3 round trips without UE support Leg swings add/abd X 20 bilat with UE support cues for slow and controlled movement Leg swings flexion/extension X 20 bilat with UE support cues for slow and controlled movements Hip circles X 15 CW,CCW bilat Standing arm rotation circles/stir the pot with dumbells X15 bilat Standing shoulder extensions with water dumbell X 15 bilat Sit to stand  X20 from pool bench Lumbar L stretch holding on at pool wall 5 sec X 10 Floating on pool noodle with cues for upright posture, 5 min for spinal decompression and LE ROM performing cycling                                                                                                                          PATIENT EDUCATION:  Education details: see above  Person educated: Patient Education method: Programmer, multimedia, Demonstration, and Handouts Education comprehension: verbalized understanding, returned demonstration, and needs further education  HOME EXERCISE PROGRAM:  TBD  ASSESSMENT:  CLINICAL IMPRESSION: She had good tolerance to aquatic PT session today except did have some pain with hip abduction at time. We will see her back next week and will work to progress her activity level in the pool as tolerated.   OBJECTIVE IMPAIRMENTS: Abnormal gait, difficulty walking, decreased ROM, decreased strength, increased fascial restrictions, increased muscle spasms, impaired flexibility, impaired sensation, improper body mechanics, postural dysfunction, obesity, and pain.   ACTIVITY LIMITATIONS: carrying, lifting, sitting, standing, transfers, bed mobility, and locomotion level  PARTICIPATION LIMITATIONS: meal prep, cleaning, laundry, driving, shopping, community activity, and occupation  PERSONAL FACTORS: Age, Behavior pattern, Education, Past/current experiences, Social background, and Time since onset of injury/illness/exacerbation are also affecting patient's functional outcome.   REHAB POTENTIAL: Poor pain/sx did not respond to multiple past courses of  skilled land based PT services   CLINICAL DECISION MAKING: Evolving/moderate complexity  EVALUATION COMPLEXITY: Moderate   GOALS: Goals reviewed with patient? No  SHORT TERM GOALS: Target date: STGs = LTGs     LONG TERM GOALS: Target date: 08/24/2023    MMT to have improved  by one grade in all weak groups  Baseline:  Goal status: INITIAL  2.  Pain to be no more than 6/10 at worst  Baseline:  Goal status: INITIAL  3.  Will demonstrate good biomechanics for bed mobility and floor to waist lifting  Baseline:  Goal status: INITIAL  4.  General hip flexibility to be no more than Moderately limited  Baseline:   Goal status: INITIAL    PLAN:  PT FREQUENCY: 1-2x/week  PT DURATION: 6 weeks  PLANNED INTERVENTIONS: 97164- PT Re-evaluation, 97110-Therapeutic exercises, 97530- Therapeutic activity, [id number]- Neuromuscular re-education, 97535- Self Care, [id number]- Manual therapy, [id number]- Aquatic Therapy, and Dry Needling.  PLAN FOR NEXT SESSION: water therapy full time for 4 weeks then transition to land if able  Ivery Quale, PT, DPT 08/10/23 11:26 AM    Referring diagnosis? M25.552, M54. 16 Treatment diagnosis? (if different than referring diagnosis) M79.604, M62.81, R26.2, R26.81, M54.59, R29.3 What was this (referring dx) caused by? []  Surgery []  Fall [x]  Ongoing issue []  Arthritis []  Other: ____________  Laterality: []  Rt []  Lt [x]  Both  Check all possible CPT codes:  *CHOOSE 10 OR LESS*    See Planned Interventions listed in the Plan section of the Evaluation.

## 2023-08-15 ENCOUNTER — Ambulatory Visit: Admitting: Surgical

## 2023-08-17 ENCOUNTER — Ambulatory Visit: Payer: Medicare HMO | Admitting: Physical Therapy

## 2023-08-17 ENCOUNTER — Encounter: Payer: Self-pay | Admitting: Physical Therapy

## 2023-08-17 DIAGNOSIS — M79604 Pain in right leg: Secondary | ICD-10-CM | POA: Diagnosis not present

## 2023-08-17 DIAGNOSIS — R2681 Unsteadiness on feet: Secondary | ICD-10-CM

## 2023-08-17 DIAGNOSIS — R293 Abnormal posture: Secondary | ICD-10-CM | POA: Diagnosis not present

## 2023-08-17 DIAGNOSIS — M6281 Muscle weakness (generalized): Secondary | ICD-10-CM | POA: Diagnosis not present

## 2023-08-17 DIAGNOSIS — M5459 Other low back pain: Secondary | ICD-10-CM | POA: Diagnosis not present

## 2023-08-17 DIAGNOSIS — R262 Difficulty in walking, not elsewhere classified: Secondary | ICD-10-CM | POA: Diagnosis not present

## 2023-08-17 NOTE — Therapy (Signed)
 OUTPATIENT PHYSICAL THERAPY TREATMENT   Patient Name: Barbara Patton MRN: [id number] DOB:03-Nov-1940, 83 y.o., female Today's Date: 08/17/2023   PT End of Session - 08/17/23 0927     Visit Number 4    Number of Visits 7    Date for PT Re-Evaluation 08/24/23    Authorization Type [employer]    Authorization Time Period 07/13/23 to 08/24/23    Progress Note Due on Visit 10    PT Start Time 0930    PT Stop Time 1015    PT Time Calculation (min) 45 min    Activity Tolerance Patient tolerated treatment well    Behavior During Therapy [facility] for tasks assessed/performed               Past Medical History:  Diagnosis Date   Glaucoma    High cholesterol    Hypertension    Lupus    Thyroid disease    Past Surgical History:  Procedure Laterality Date   ABDOMINAL HYSTERECTOMY     CATARACT EXTRACTION, BILATERAL     TOTAL KNEE ARTHROPLASTY Bilateral    TUBAL LIGATION     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   Menopausal symptom 10/20/2021   Overweight 10/20/2021   History of colonic polyps 10/20/2021   Urinary incontinence 10/20/2021   Globus pharyngeus 05/23/2021   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   Referred otalgia of left ear 08/23/2015   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   Allergic rhinitis 07/26/2015   Anxiety 07/26/2015   Benign essential hypertension 07/26/2015   Dysfunction of left eustachian tube 07/26/2015   Esophageal reflux 07/26/2015   Fibromyalgia 07/26/2015   Pure hypercholesterolemia 07/26/2015   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 05/03/2014   Pes anserinus bursitis of left knee 12/04/2013   Trochanteric bursitis of left hip 08/07/2013   Leg swelling 12/06/2011   Glaucoma 11/02/2011   HLD  (hyperlipidemia) 11/02/2011   HTN (hypertension) 11/02/2011   Hypothyroid 11/02/2011   OA (osteoarthritis) 11/02/2011   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) 03/16/2011   Presence of artificial knee joint 03/16/2011    PCP: Renford Dills MD   REFERRING PROVIDER: Julieanne Cotton, PA-C  REFERRING DIAG: Diagnosis M25.552 (ICD-10-CM) - Pain in left hip M54.16 (ICD-10-CM) - Lumbar radiculopathy  Rationale for Evaluation and Treatment: Rehabilitation  THERAPY DIAG:  Pain in right leg  Muscle weakness (generalized)  Difficulty in walking, not elsewhere classified  Unsteadiness on feet  Other low back pain  Abnormal posture  ONSET DATE: chronic   SUBJECTIVE:  SUBJECTIVE STATEMENT: She says some pain in her right hip area that comes and goes, but sometimes it hurts even I am not doing anything.  PERTINENT HISTORY:  See above   PAIN:  Are you having pain? Yes: NPRS scale: 4/10  Pain location: R leg  Pain description: side of R LE  Aggravating factors: moving R LE in general, transfers   Relieving factors: none known   PRECAUTIONS: None  RED FLAGS: None   WEIGHT BEARING RESTRICTIONS: No  FALLS:  Has patient fallen in last 6 months? No  LIVING ENVIRONMENT: Lives with: lives with their daughter Lives in: House/apartment Stairs: no steps  Has following equipment at home: Single point cane and "walker but I don't know if it has 2 wheels or 4"   OCCUPATION: retired   PLOF: Independent, Independent with basic ADLs, Independent with gait, and Independent with transfers  PATIENT GOALS: to not have any pain   NEXT MD VISIT: not scheduled/PRN   OBJECTIVE:  Note: Objective measures were completed at Evaluation unless otherwise noted.  DIAGNOSTIC FINDINGS:   FINDINGS: No  evidence for an acute fracture in the bony pelvis. SI joints and symphysis pubis unremarkable. Joint space in the right hip is relatively well preserved. There is loss of joint space in the left hip with degenerative spurring. No evidence for left femoral neck fracture.   IMPRESSION: 1. No acute bony findings. 2. Degenerative changes in the left hip.    CLINICAL DATA:  Left leg pain, denies injury or fall   EXAM: LEFT FEMUR PORTABLE 1 VIEW   COMPARISON:  10/01/2022   FINDINGS: No acute fracture or dislocation. Moderate degenerative arthritis left hip. Partially visualized left TKA.   IMPRESSION: No acute fracture or dislocation. Moderate degenerative arthritis left hip.  AP, frog lateral view of left hip reviewed.  Moderate degenerative changes  noted of the left hip joint.  No fracture or dislocation.  EXAM: MRI LUMBAR SPINE WITHOUT CONTRAST   TECHNIQUE: Multiplanar, multisequence MR imaging of the lumbar spine was performed. No intravenous contrast was administered.   COMPARISON:  Radiographs 10/01/2022 and 07/31/2022.  MRI 12/04/2019.   FINDINGS: Segmentation: Conventional anatomy assumed, with the last open disc space designated L5-S1.Concordant with prior imaging.   Alignment: Grade 1 anterolisthesis at L3-4 and L4-5, similar to previous MRI.   Vertebrae: There are mild superior endplate compression fractures at the L3 and L4 levels, resulting in approximately 10% loss of vertebral body height. There is associated bone marrow edema, but no osseous retropulsion or definite involvement of the posterior elements. These fractures are not clearly seen on recent or prior radiographs. No other acute osseous findings. The visualized sacroiliac joints appear unremarkable.   Conus medullaris: Extends to the L1-2 level and appears normal.   Paraspinal and other soft tissues: No acute paraspinal findings. The urinary bladder is noted to be distended and  moderately trabeculated. 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   Disc levels:   Sagittal images demonstrate no significant disc space findings within the visualized lower thoracic spine. There is mild disc bulging at T11-12 and T12-L1.   L1-2: Preserved disc height. Mild facet hypertrophy. No spinal stenosis or nerve root encroachment.   L2-3: Preserved disc height with mild disc bulging and mild-to-moderate facet hypertrophy. No spinal stenosis or nerve root encroachment.   L3-4: Moderate to severe facet hypertrophy accounts for the grade 1 anterolisthesis and contributes to annular disc bulging and uncovering. There is resulting moderate multifactorial spinal stenosis with mild  lateral recess and minimal foraminal narrowing bilaterally. The spinal stenosis has progressed from the previous MRI   L4-5: Moderate to severe facet hypertrophy accounts for the grade 1 anterolisthesis and contributes to annular disc bulging and uncovering. There is only resulting borderline spinal stenosis at this level with minimal left lateral recess and left foraminal narrowing. No nerve root encroachment. No significant changes are seen at this level.   L5-S1: Chronic disc bulging with endplate osteophytes asymmetric to the left. Mild facet hypertrophy. No spinal stenosis or nerve root encroachment.   IMPRESSION: 1. Acute/subacute mild superior endplate compression fractures at L3 and L4 with associated bone marrow edema, but no osseous retropulsion. 2. 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 3. Borderline spinal stenosis at L4-5 secondary to facet hypertrophy and a grade 1 anterolisthesis. 4. Distended urinary bladder with trabeculation, suggesting chronic bladder outlet obstruction.  Three-view radiographs of her right knee were reviewed today she is status  post total right knee arthroplasty components remain in stable  position  when compared to previous x-rays.  No evidence of any periprosthetic  Fracture  THORACIC SPINE 2 VIEWS   COMPARISON:  None Available.   FINDINGS: No acute fracture or traumatic malalignment. Degenerative disc narrowing with endplate spurring in the mid and lower thoracic spine. Maintained posterior mediastinal fat planes. Mild thoracic levocurvature.   IMPRESSION: Degenerative changes without acute or focal finding.      PATIENT SURVEYS:  FOTO 45, predicted 64 in 9 visits   COGNITION: Overall cognitive status: Within functional limits for tasks assessed     SENSATION: Not tested  MUSCLE LENGTH:   HS mild limitation B (tested in sitting) Piriformis severe limitation B   POSTURE: rounded shoulders, forward head, decreased lumbar lordosis, and increased thoracic kyphosis    LUMBAR ROM:   AROM eval  Flexion   Extension   Right lateral flexion   Left lateral flexion   Right rotation   Left rotation    (Blank rows = not tested)   LOWER EXTREMITY MMT:    MMT Right eval Left eval  Hip flexion 3- 3-  Hip extension    Hip abduction    Hip adduction    Hip internal rotation    Hip external rotation    Knee flexion 4 4  Knee extension 4+ 4+  Ankle dorsiflexion    Ankle plantarflexion    Ankle inversion    Ankle eversion     (Blank rows = not tested)   TREATMENT DATE:  08/17/23 Pt seen for aquatic therapy today.  Treatment took place in water 3.5-4.75 ft in depth at the [facility] pool. Temp of water was 91.  Pt entered/exited the pool via stairs with  rail.   Pt requires the buoyancy and hydrostatic pressure of water for support, and to offload joints by unweighting joint load by at least 50 % in navel deep water and by at least 75-80% in chest to neck deep water.  Viscosity of the water is needed for resistance of strengthening. Water current perturbations provides challenge to standing balance requiring increased core  activation. Aquatic exercises Lateral walking, forward walking, retro walking 3 round trips without UE support Leg swings add/abd X 15 bilat with UE support cues for slow and controlled movement Leg swings flexion/extension X 15 bilat with UE support cues for slow and controlled movements Marches X 15 bilat Hip circles X 15 CW,CCW bilat Heel and toe raises X 15 bilat Standing arm  rotation circles/stir the pot with dumbells X15 bilat Standing shoulder extensions with water dumbell X 15 bilat Sit to stand  X20 from pool bench Tandem walk with dumbells 2 round trips at pool Sit to stand at pool bench 2X10 Lumbar L stretch holding on at pool wall 5 sec X 10 Floating on pool noodle with cues for upright posture, 5 min for spinal decompression and LE ROM performing cycling                                                                                                                         PATIENT EDUCATION:  Education details: see above  Person educated: Patient Education method: Programmer, multimedia, Demonstration, and Handouts Education comprehension: verbalized understanding, returned demonstration, and needs further education  HOME EXERCISE PROGRAM:  TBD  ASSESSMENT:  CLINICAL IMPRESSION: Some improvements in aquatic activity tolerance today but does continue to have widespread pain outside of water.    OBJECTIVE IMPAIRMENTS: Abnormal gait, difficulty walking, decreased ROM, decreased strength, increased fascial restrictions, increased muscle spasms, impaired flexibility, impaired sensation, improper body mechanics, postural dysfunction, obesity, and pain.   ACTIVITY LIMITATIONS: carrying, lifting, sitting, standing, transfers, bed mobility, and locomotion level  PARTICIPATION LIMITATIONS: meal prep, cleaning, laundry, driving, shopping, community activity, and occupation  PERSONAL FACTORS: Age, Behavior pattern, Education, Past/current experiences, Social background, and Time since  onset of injury/illness/exacerbation are also affecting patient's functional outcome.   REHAB POTENTIAL: Poor pain/sx did not respond to multiple past courses of  skilled land based PT services   CLINICAL DECISION MAKING: Evolving/moderate complexity  EVALUATION COMPLEXITY: Moderate   GOALS: Goals reviewed with patient? No  SHORT TERM GOALS: Target date: STGs = LTGs     LONG TERM GOALS: Target date: 08/24/2023    MMT to have improved by one grade in all weak groups  Baseline:  Goal status: INITIAL  2.  Pain to be no more than 6/10 at worst  Baseline:  Goal status: INITIAL  3.  Will demonstrate good biomechanics for bed mobility and floor to waist lifting  Baseline:  Goal status: INITIAL  4.  General hip flexibility to be no more than Moderately limited  Baseline:  Goal status: INITIAL    PLAN:  PT FREQUENCY: 1-2x/week  PT DURATION: 6 weeks  PLANNED INTERVENTIONS: 97164- PT Re-evaluation, 97110-Therapeutic exercises, 97530- Therapeutic activity, [id number]- Neuromuscular re-education, 97535- Self Care, [id number]- Manual therapy, [id number]- Aquatic Therapy, and Dry Needling.  PLAN FOR NEXT SESSION: water therapy 2 more visits scheduled Ivery Quale, PT, DPT 08/17/23 9:37 AM    Referring diagnosis? M25.552, M54. 16 Treatment diagnosis? (if different than referring diagnosis) M79.604, M62.81, R26.2, R26.81, M54.59, R29.3 What was this (referring dx) caused by? []  Surgery []  Fall [x]  Ongoing issue []  Arthritis []  Other: ____________  Laterality: []  Rt []  Lt [x]  Both  Check all possible CPT codes:  *CHOOSE 10 OR LESS*    See Planned Interventions listed in the Plan section of the Evaluation.

## 2023-08-24 ENCOUNTER — Encounter: Payer: Self-pay | Admitting: Physical Therapy

## 2023-08-24 ENCOUNTER — Ambulatory Visit: Payer: Medicare HMO | Admitting: Physical Therapy

## 2023-08-24 DIAGNOSIS — M79604 Pain in right leg: Secondary | ICD-10-CM | POA: Diagnosis not present

## 2023-08-24 DIAGNOSIS — R262 Difficulty in walking, not elsewhere classified: Secondary | ICD-10-CM | POA: Diagnosis not present

## 2023-08-24 DIAGNOSIS — R2681 Unsteadiness on feet: Secondary | ICD-10-CM

## 2023-08-24 DIAGNOSIS — M5459 Other low back pain: Secondary | ICD-10-CM

## 2023-08-24 DIAGNOSIS — M6281 Muscle weakness (generalized): Secondary | ICD-10-CM | POA: Diagnosis not present

## 2023-08-24 NOTE — Therapy (Signed)
 OUTPATIENT PHYSICAL THERAPY TREATMENT   Patient Name: Barbara Patton MRN: [id number] DOB:1941/04/22, 83 y.o., female Today's Date: 08/24/2023   PT End of Session - 08/24/23 1108     Visit Number 5    Number of Visits 7    Date for PT Re-Evaluation 08/24/23    Authorization Type [employer]    Authorization Time Period 07/13/23 to 08/24/23    Progress Note Due on Visit 10    PT Start Time 1107    PT Stop Time 1145    PT Time Calculation (min) 38 min    Activity Tolerance Patient tolerated treatment well    Behavior During Therapy WFL for tasks assessed/performed               Past Medical History:  Diagnosis Date   Glaucoma    High cholesterol    Hypertension    Lupus    Thyroid disease    Past Surgical History:  Procedure Laterality Date   ABDOMINAL HYSTERECTOMY     CATARACT EXTRACTION, BILATERAL     TOTAL KNEE ARTHROPLASTY Bilateral    TUBAL LIGATION     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   Menopausal symptom 10/20/2021   Overweight 10/20/2021   History of colonic polyps 10/20/2021   Urinary incontinence 10/20/2021   Globus pharyngeus 05/23/2021   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   Referred otalgia of left ear 08/23/2015   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   Allergic rhinitis 07/26/2015   Anxiety 07/26/2015   Benign essential hypertension 07/26/2015   Dysfunction of left eustachian tube 07/26/2015   Esophageal reflux 07/26/2015   Fibromyalgia 07/26/2015   Pure hypercholesterolemia 07/26/2015   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 05/03/2014   Pes anserinus bursitis of left knee 12/04/2013   Trochanteric bursitis of left hip 08/07/2013   Leg swelling 12/06/2011   Glaucoma 11/02/2011   HLD  (hyperlipidemia) 11/02/2011   HTN (hypertension) 11/02/2011   Hypothyroid 11/02/2011   OA (osteoarthritis) 11/02/2011   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 PERTINENT HISTORY:  See above   PAIN:  Are you having pain? Yes: NPRS scale: 8/10  Pain location: low back Pain description: side of R LE  Aggravating factors: moving R LE in general, transfers   Relieving factors: none known   PRECAUTIONS: None  RED FLAGS: None   WEIGHT BEARING RESTRICTIONS: No  FALLS:  Has patient fallen in last 6 months? No  LIVING ENVIRONMENT: Lives with: lives with their daughter Lives in: House/apartment Stairs: no steps  Has following equipment at home: Single point cane and "walker but I don't know if it has 2 wheels or 4"   OCCUPATION: retired   PLOF: Independent, Independent with basic ADLs, Independent with gait, and Independent with transfers  PATIENT GOALS: to not have any pain   NEXT MD VISIT: not scheduled/PRN   OBJECTIVE:  Note: Objective measures were completed at Evaluation unless otherwise noted.  DIAGNOSTIC FINDINGS:   FINDINGS: No evidence for an acute fracture in the bony pelvis.  SI joints and symphysis pubis unremarkable. Joint space in the right hip is relatively well preserved. There is loss of joint space in the left hip with degenerative spurring. No evidence for left femoral neck fracture.   IMPRESSION: 1. No acute bony findings. 2. Degenerative changes in the left hip.    CLINICAL DATA:  Left leg pain, denies injury or fall   EXAM: LEFT FEMUR PORTABLE 1 VIEW   COMPARISON:  10/01/2022   FINDINGS: No acute fracture or dislocation. Moderate degenerative arthritis left hip. Partially visualized left TKA.   IMPRESSION: No acute fracture or dislocation. Moderate degenerative arthritis left hip.  AP, frog lateral view of left hip reviewed.  Moderate degenerative changes  noted of the left hip joint.  No fracture or dislocation.  EXAM: MRI LUMBAR SPINE WITHOUT CONTRAST   TECHNIQUE: Multiplanar, multisequence MR imaging of the lumbar spine was performed. No intravenous contrast was administered.   COMPARISON:  Radiographs 10/01/2022 and 07/31/2022.  MRI 12/04/2019.   FINDINGS: Segmentation: Conventional anatomy assumed, with the last open disc space designated L5-S1.Concordant with prior imaging.   Alignment: Grade 1 anterolisthesis at L3-4 and L4-5, similar to previous MRI.   Vertebrae: There are mild superior endplate compression fractures at the L3 and L4 levels, resulting in approximately 10% loss of vertebral body height. There is associated bone marrow edema, but no osseous retropulsion or definite involvement of the posterior elements. These fractures are not clearly seen on recent or prior radiographs. No other acute osseous findings. The visualized sacroiliac joints appear unremarkable.   Conus medullaris: Extends to the L1-2 level and appears normal.   Paraspinal and other soft tissues: No acute paraspinal findings. The urinary bladder is noted to be distended and moderately trabeculated. Chronic atrophy of the erector spinae  musculature.   Disc levels:   Sagittal images demonstrate no significant disc space findings within the visualized lower thoracic spine. There is mild disc bulging at T11-12 and T12-L1.   L1-2: Preserved disc height. Mild facet hypertrophy. No spinal stenosis or nerve root encroachment.   L2-3: Preserved disc height with mild disc bulging and mild-to-moderate facet hypertrophy. No spinal stenosis or nerve root encroachment.   L3-4: Moderate to severe facet hypertrophy accounts for the grade 1 anterolisthesis and contributes to annular disc bulging and uncovering. There is resulting moderate multifactorial spinal stenosis with mild lateral recess and minimal foraminal narrowing bilaterally. The spinal stenosis  has progressed from the previous MRI   L4-5: Moderate to severe facet hypertrophy accounts for the grade 1 anterolisthesis and contributes to annular disc bulging and uncovering. There is only resulting borderline spinal stenosis at this level with minimal left lateral recess and left foraminal narrowing. No nerve root encroachment. No significant changes are seen at this level.   L5-S1: Chronic disc bulging with endplate osteophytes asymmetric to the left. Mild facet hypertrophy. No spinal stenosis or nerve root encroachment.   IMPRESSION: 1. Acute/subacute mild superior endplate compression fractures at L3 and L4 with associated bone marrow edema, but no osseous retropulsion. 2. 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 3. Borderline spinal stenosis at L4-5 secondary to facet hypertrophy and a grade 1 anterolisthesis. 4. Distended urinary bladder with trabeculation, suggesting chronic bladder outlet obstruction.  Three-view radiographs of her right knee were reviewed today she is status  post total right knee arthroplasty components remain in stable position  when compared to previous x-rays.  No evidence of any  periprosthetic  Fracture  THORACIC SPINE 2 VIEWS   COMPARISON:  None Available.   FINDINGS: No acute fracture or traumatic malalignment. Degenerative disc narrowing with endplate spurring in the mid and lower thoracic spine. Maintained posterior mediastinal fat planes. Mild thoracic levocurvature.   IMPRESSION: Degenerative changes without acute or focal finding.      PATIENT SURVEYS:  FOTO 45, predicted 64 in 9 visits   COGNITION: Overall cognitive status: Within functional limits for tasks assessed     SENSATION: Not tested  MUSCLE LENGTH:   HS mild limitation B (tested in sitting) Piriformis severe limitation B   POSTURE: rounded shoulders, forward head, decreased lumbar lordosis, and increased thoracic kyphosis    LUMBAR ROM:   AROM eval  Flexion   Extension   Right lateral flexion   Left lateral flexion   Right rotation   Left rotation    (Blank rows = not tested)   LOWER EXTREMITY MMT:    MMT Right eval Left eval  Hip flexion 3- 3-  Hip extension    Hip abduction    Hip adduction    Hip internal rotation    Hip external rotation    Knee flexion 4 4  Knee extension 4+ 4+  Ankle dorsiflexion    Ankle plantarflexion    Ankle inversion    Ankle eversion     (Blank rows = not tested)   TREATMENT DATE:  08/24/23 Pt seen for aquatic therapy today.  Treatment took place in water 3.5-4.75 ft in depth at the [facility] pool. Temp of water was 91.  Pt entered/exited the pool via stairs with  rail.   Pt requires the buoyancy and hydrostatic pressure of water for support, and to offload joints by unweighting joint load by at least 50 % in navel deep water and by at least 75-80% in chest to neck deep water.  Viscosity of the water is needed for resistance of strengthening. Water current perturbations provides challenge to standing balance requiring increased core activation. Aquatic exercises Lateral walking, forward walking, retro walking  3 round trips without UE support Leg swings add/abd X 15 bilat with UE support cues for slow and controlled movement Leg swings flexion/extension X 15 bilat with UE support cues for slow and controlled movements Marches X 15 bilat Hip circles X 15 CW,CCW bilat Heel and toe raises X 15 bilat Standing arm rotation circles/stir the pot with dumbells X15 bilat Standing shoulder  extensions with water dumbell X 15 bilat Sit to stand  X20 from pool bench Tandem walk with dumbells 2 round trips at pool Sit to stand at pool bench 2X10 Lumbar L stretch holding on at pool wall 5 sec X 10 Floating on pool noodle with cues for upright posture, 5 min for spinal decompression and LE ROM performing cycling   08/17/23 Pt seen for aquatic therapy today.  Treatment took place in water 3.5-4.75 ft in depth at the [facility] pool. Temp of water was 91.  Pt entered/exited the pool via stairs with  rail.   Pt requires the buoyancy and hydrostatic pressure of water for support, and to offload joints by unweighting joint load by at least 50 % in navel deep water and by at least 75-80% in chest to neck deep water.  Viscosity of the water is needed for resistance of strengthening. Water current perturbations provides challenge to standing balance requiring increased core activation. Aquatic exercises Lateral walking, forward walking, retro walking 3 round trips without UE support Leg swings add/abd X 15 bilat with UE support cues for slow and controlled movement Leg swings flexion/extension X 15 bilat with UE support cues for slow and controlled movements Marches X 15 bilat Hip circles X 15 CW,CCW bilat Heel and toe raises X 15 bilat Standing arm rotation circles/stir the pot with dumbells X15 bilat Standing shoulder extensions with water dumbell X 15 bilat Sit to stand  X20 from pool bench Tandem walk with dumbells 2 round trips at pool Sit to stand at pool bench 2X10 Lumbar L stretch holding on at  pool wall 5 sec X 10 Floating on pool noodle with cues for upright posture, 5 min for spinal decompression and LE ROM performing cycling                                                                                                                         PATIENT EDUCATION:  Education details: see above  Person educated: Patient Education method: Programmer, multimedia, Demonstration, and Handouts Education comprehension: verbalized understanding, returned demonstration, and needs further education  HOME EXERCISE PROGRAM:  TBD  ASSESSMENT:  CLINICAL IMPRESSION: Continues to report chronic pain, had 8/10 back pain today. She will follow up with MD 08/27/23. She has one more visit left and PT recommending discharge to independent program after that.   OBJECTIVE IMPAIRMENTS: Abnormal gait, difficulty walking, decreased ROM, decreased strength, increased fascial restrictions, increased muscle spasms, impaired flexibility, impaired sensation, improper body mechanics, postural dysfunction, obesity, and pain.   ACTIVITY LIMITATIONS: carrying, lifting, sitting, standing, transfers, bed mobility, and locomotion level  PARTICIPATION LIMITATIONS: meal prep, cleaning, laundry, driving, shopping, community activity, and occupation  PERSONAL FACTORS: Age, Behavior pattern, Education, Past/current experiences, Social background, and Time since onset of injury/illness/exacerbation are also affecting patient's functional outcome.   REHAB POTENTIAL: Poor pain/sx did not respond to multiple past courses of  skilled land based PT services   CLINICAL DECISION MAKING: Evolving/moderate complexity  EVALUATION  COMPLEXITY: Moderate   GOALS: Goals reviewed with patient? No  SHORT TERM GOALS: Target date: STGs = LTGs     LONG TERM GOALS: Target date: 08/24/2023    MMT to have improved by one grade in all weak groups  Baseline:  Goal status: INITIAL  2.  Pain to be no more than 6/10 at worst  Baseline:   Goal status: INITIAL  3.  Will demonstrate good biomechanics for bed mobility and floor to waist lifting  Baseline:  Goal status: INITIAL  4.  General hip flexibility to be no more than Moderately limited  Baseline:  Goal status: INITIAL    PLAN:  PT FREQUENCY: 1-2x/week  PT DURATION: 6 weeks  PLANNED INTERVENTIONS: [id number]- PT Re-evaluation, 97110-Therapeutic exercises, 97530- Therapeutic activity, [id number]- Neuromuscular re-education, 97535- Self Care, [id number]- Manual therapy, [id number]- Aquatic Therapy, and Dry Needling.  PLAN FOR NEXT SESSION: water therapy 1 more visits scheduled and recommend discharge to independent program Ivery Quale, PT, DPT 08/24/23 11:09 AM    Referring diagnosis? M25.552, M54. 16 Treatment diagnosis? (if different than referring diagnosis) M79.604, M62.81, R26.2, R26.81, M54.59, R29.3 What was this (referring dx) caused by? []  Surgery []  Fall [x]  Ongoing issue []  Arthritis []  Other: ____________  Laterality: []  Rt []  Lt [x]  Both  Check all possible CPT codes:  *CHOOSE 10 OR LESS*    See Planned Interventions listed in the Plan section of the Evaluation.

## 2023-08-25 ENCOUNTER — Other Ambulatory Visit: Payer: Self-pay | Admitting: Neurology

## 2023-08-25 DIAGNOSIS — R251 Tremor, unspecified: Secondary | ICD-10-CM

## 2023-08-27 ENCOUNTER — Other Ambulatory Visit (INDEPENDENT_AMBULATORY_CARE_PROVIDER_SITE_OTHER): Payer: Self-pay

## 2023-08-27 ENCOUNTER — Ambulatory Visit (INDEPENDENT_AMBULATORY_CARE_PROVIDER_SITE_OTHER): Admitting: Surgical

## 2023-08-27 ENCOUNTER — Other Ambulatory Visit: Payer: Self-pay

## 2023-08-27 DIAGNOSIS — M1612 Unilateral primary osteoarthritis, left hip: Secondary | ICD-10-CM

## 2023-08-27 DIAGNOSIS — M25531 Pain in right wrist: Secondary | ICD-10-CM | POA: Diagnosis not present

## 2023-08-27 DIAGNOSIS — M25561 Pain in right knee: Secondary | ICD-10-CM | POA: Diagnosis not present

## 2023-08-27 DIAGNOSIS — M19031 Primary osteoarthritis, right wrist: Secondary | ICD-10-CM | POA: Diagnosis not present

## 2023-08-27 DIAGNOSIS — M25562 Pain in left knee: Secondary | ICD-10-CM

## 2023-08-27 DIAGNOSIS — M25552 Pain in left hip: Secondary | ICD-10-CM | POA: Diagnosis not present

## 2023-08-27 DIAGNOSIS — M79642 Pain in left hand: Secondary | ICD-10-CM

## 2023-08-27 DIAGNOSIS — G8929 Other chronic pain: Secondary | ICD-10-CM

## 2023-08-27 DIAGNOSIS — M79641 Pain in right hand: Secondary | ICD-10-CM

## 2023-08-29 ENCOUNTER — Encounter: Payer: Self-pay | Admitting: Surgical

## 2023-08-29 MED ORDER — BUPIVACAINE HCL 0.25 % IJ SOLN
7.0000 mL | INTRAMUSCULAR | Status: AC | PRN
  Administered 2023-08-27: 7 mL via INTRA_ARTICULAR

## 2023-08-29 MED ORDER — BUPIVACAINE HCL 0.25 % IJ SOLN
0.6600 mL | INTRAMUSCULAR | Status: AC | PRN
  Administered 2023-08-27: .66 mL via INTRA_ARTICULAR

## 2023-08-29 MED ORDER — METHYLPREDNISOLONE ACETATE 40 MG/ML IJ SUSP
40.0000 mg | INTRAMUSCULAR | Status: AC | PRN
  Administered 2023-08-27: 40 mg via INTRA_ARTICULAR

## 2023-08-29 MED ORDER — METHYLPREDNISOLONE ACETATE 40 MG/ML IJ SUSP
13.3300 mg | INTRAMUSCULAR | Status: AC | PRN
  Administered 2023-08-27: 13.33 mg via INTRA_ARTICULAR

## 2023-08-29 MED ORDER — LIDOCAINE HCL 1 % IJ SOLN
5.0000 mL | INTRAMUSCULAR | Status: AC | PRN
Start: 2023-08-27 — End: 2023-08-27
  Administered 2023-08-27: 5 mL

## 2023-08-29 MED ORDER — LIDOCAINE HCL 1 % IJ SOLN
3.0000 mL | INTRAMUSCULAR | Status: AC | PRN
  Administered 2023-08-27: 3 mL

## 2023-08-29 NOTE — Progress Notes (Signed)
 Office Visit Note   Patient: Barbara Patton           Date of Birth: 11-Jul-1940           MRN: [id number] Visit Date: 08/27/2023 Requested by: Renford Dills, MD [address] [place],  [place] [address] PCP: Renford Dills, MD  Subjective: Chief Complaint  Patient presents with   Left Knee - Pain   Right Knee - Pain   Left Hand - Pain   Right Hand - Pain    HPI: Barbara Patton is a 83 y.o. female who presents to the office reporting left leg pain.  She describes left groin pain with pain that radiates down the anterior medial thigh to her knee.  She feels the knee is "heavy" at times with swelling as well.  She does not really have any pain that radiates past the knee.  Occasional lateral hip pain as she has had in the past but currently no radicular pain.  She also complains of bilateral hand pain that she localizes to the base of her thumbs as well as her wrists more so on the radial side and the dorsal side.  Her right wrist bothers her more than her left wrist.  She is right-hand dominant.  She ambulates with a cane.  Takes Tylenol for her pain..                ROS: All systems reviewed are negative as they relate to the chief complaint within the history of present illness.  Patient denies fevers or chills.  Assessment & Plan: Visit Diagnoses:  1. Bilateral chronic knee pain   2. Bilateral hand pain   3. Pain in left hip   4. Pain in right wrist     Plan: Patient is an 83 year old female who presents for evaluation of left leg pain as well as bilateral hand/wrist pain.  She has radiographs of her bilateral knees demonstrating knee arthroplasty prostheses that are in good position with no change compared with prior radiographs.  Most of her pain is in the left leg and seems to be more coming from her left hip with the referred pain from the groin down to the hip joint with no radiation past the knee.  I would recommend trying an injection in the left hip joint to  see if this will give her some relief.  We have talked about hip replacement in the past but she decided to forego this procedure at least for now.  Left hip injection was administered under ultrasound guidance and patient tolerated procedure well without complication.  Regarding the hand/wrist pain, she has 2 possible etiologies based on radiographs reviewed today and her exam.  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 arthritis she also has.  After discussion of options, she would like to try injection.  Right wrist radiocarpal injection successfully administered and patient tolerated procedure well.  She will let us know how this does for her and if little to no improvement, we could try [facility] injection in the future.  Follow-up with the office as needed.  Follow-Up Instructions: No follow-ups on file.   Orders:  Orders Placed This Encounter  Procedures   XR Knee 1-2 Views Right   XR Knee 1-2 Views Left   XR Hand Complete Left   XR Hand Complete Right   US Guided Needle Placement - No Linked Charges   US Guided Needle Placement -  No Linked Charges   No orders of the defined types were placed in this encounter.     Procedures: Large Joint Inj: L hip joint on 08/27/2023 8:25 PM Indications: pain and diagnostic evaluation Details: 22 G 3.5 in needle, ultrasound-guided anterior approach Medications: 5 mL lidocaine 1 %; 7 mL bupivacaine 0.25 %; 40 mg methylPREDNISolone acetate 40 MG/ML Outcome: tolerated well, no immediate complications Procedure, treatment alternatives, risks and benefits explained, specific risks discussed. Consent was given by the patient. Immediately prior to procedure a time out was called to verify the correct patient, procedure, equipment, support staff and site/side marked as required. Patient was prepped and draped in the usual sterile fashion.    Medium Joint Inj: R radiocarpal on 08/27/2023 8:26 PM Indications: pain and  diagnostic evaluation Details: 25 G 1.5 in needle, ultrasound-guided dorsal approach Medications: 3 mL lidocaine 1 %; 13.33 mg methylPREDNISolone acetate 40 MG/ML; 0.66 mL bupivacaine 0.25 % Outcome: tolerated well, no immediate complications Procedure, treatment alternatives, risks and benefits explained, specific risks discussed. Consent was given by the patient. Immediately prior to procedure a time out was called to verify the correct patient, procedure, equipment, support staff and site/side marked as required. Patient was prepped and draped in the usual sterile fashion.       Clinical Data: No additional findings.  Objective: Vital Signs: There were no vitals taken for this visit.  Physical Exam:  Constitutional: Patient appears well-developed HEENT:  Head: Normocephalic Eyes:EOM are normal Neck: Normal range of motion Cardiovascular: Normal rate Pulmonary/chest: Effort normal Neurologic: Patient is alert Skin: Skin is warm Psychiatric: Patient has normal mood and affect  Ortho Exam: Ortho exam demonstrates bilateral wrist with palpable radial pulse.  She has pain with thumb circumduction bilaterally, more so in the right hand.  She also has tenderness over the first [facility] joint bilaterally.  This tenderness is not quite as bad as the tenderness over the radiocarpal joint noted over the 3-4 portal.  There is no visible deformity in either wrist aside from some very minimal swelling over the dorsum of the right wrist.  She has pain reproduced with left hip range of motion that localizes to the left knee as well as the left thigh.  She has intact hip flexion with reproduction of pain both in her groin and her knee.  She has some mild tenderness over the greater trochanter in the left but not the right hip.  Palpable DP pulse of the left lower extremity.  She has intact EPL, FPL, finger abduction of both upper extremities with good grip strength.  Does have some pain reproduced with  thumb adduction though again this is not as bad as pain reproduced with wrist range of motion.  No significant reduction in wrist range of motion.  Specialty Comments:  EXAM: MRI LUMBAR SPINE WITHOUT CONTRAST   TECHNIQUE: Multiplanar, multisequence MR imaging of the lumbar spine was performed. No intravenous contrast was administered.   COMPARISON:  Radiographs 10/01/2022 and 07/31/2022.  MRI 12/04/2019.   FINDINGS: Segmentation: Conventional anatomy assumed, with the last open disc space designated L5-S1.Concordant with prior imaging.   Alignment: Grade 1 anterolisthesis at L3-4 and L4-5, similar to previous MRI.   Vertebrae: There are mild superior endplate compression fractures at the L3 and L4 levels, resulting in approximately 10% loss of vertebral body height. There is associated bone marrow edema, but no osseous retropulsion or definite involvement of the posterior elements. These fractures are not clearly seen on recent or prior radiographs.  No other acute osseous findings. The visualized sacroiliac joints appear unremarkable.   Conus medullaris: Extends to the L1-2 level and appears normal.   Paraspinal and other soft tissues: No acute paraspinal findings. The urinary bladder is noted to be distended and moderately trabeculated. 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   Disc levels:   Sagittal images demonstrate no significant disc space findings within the visualized lower thoracic spine. There is mild disc bulging at T11-12 and T12-L1.   L1-2: Preserved disc height. Mild facet hypertrophy. No spinal stenosis or nerve root encroachment.   L2-3: Preserved disc height with mild disc bulging and mild-to-moderate facet hypertrophy. No spinal stenosis or nerve root encroachment.   L3-4: Moderate to severe facet hypertrophy accounts for the grade 1 anterolisthesis and contributes to annular disc bulging and uncovering. There is resulting moderate  multifactorial spinal stenosis with mild lateral recess and minimal foraminal narrowing bilaterally. The spinal stenosis has progressed from the previous MRI   L4-5: Moderate to severe facet hypertrophy accounts for the grade 1 anterolisthesis and contributes to annular disc bulging and uncovering. There is only resulting borderline spinal stenosis at this level with minimal left lateral recess and left foraminal narrowing. No nerve root encroachment. No significant changes are seen at this level.   L5-S1: Chronic disc bulging with endplate osteophytes asymmetric to the left. Mild facet hypertrophy. No spinal stenosis or nerve root encroachment.   IMPRESSION: 1. Acute/subacute mild superior endplate compression fractures at L3 and L4 with associated bone marrow edema, but no osseous retropulsion. 2. 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 3. Borderline spinal stenosis at L4-5 secondary to facet hypertrophy and a grade 1 anterolisthesis. 4. Distended urinary bladder with trabeculation, suggesting chronic bladder outlet obstruction.     Electronically Signed   By: Carey Bullocks M.D.   On: 10/26/2022 10:57  Imaging: No results found.   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   Allergic rhinitis 07/26/2015   Anxiety 07/26/2015   Benign essential hypertension 07/26/2015   Dysfunction of left eustachian tube 07/26/2015   Esophageal reflux 07/26/2015    Fibromyalgia 07/26/2015   Pure hypercholesterolemia 07/26/2015   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 05/03/2014   Pes anserinus bursitis of left knee 12/04/2013   Trochanteric bursitis of left hip 08/07/2013   Leg swelling 12/06/2011   Glaucoma 11/02/2011   HLD (hyperlipidemia) 11/02/2011   HTN (hypertension) 11/02/2011   Hypothyroid 11/02/2011   OA (osteoarthritis) 11/02/2011   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) 03/16/2011   Presence of artificial knee joint 03/16/2011   Past Medical History:  Diagnosis Date   Glaucoma    High cholesterol    Hypertension    Lupus    Thyroid disease     Family History  Problem Relation Age of Onset   Cancer Father    Cancer Brother    Cancer Brother    Stroke Brother     Past  Surgical History:  Procedure Laterality Date   ABDOMINAL HYSTERECTOMY     CATARACT EXTRACTION, BILATERAL     TOTAL KNEE ARTHROPLASTY Bilateral    TUBAL LIGATION     Social History   Occupational History   Not on file  Tobacco Use   Smoking status: Former    Current packs/day: 0.00    Types: Cigarettes    Quit date: 1997    Years since quitting: 28.2   Smokeless tobacco: Never  Vaping Use   Vaping status: Never Used  Substance and Sexual Activity   Alcohol use: Yes    Alcohol/week: 3.0 standard drinks of alcohol    Types: 3 Glasses of wine per week    Comment: occ wine   Drug use: No   Sexual activity: Not on file

## 2023-08-31 ENCOUNTER — Encounter: Payer: Self-pay | Admitting: Physical Therapy

## 2023-08-31 ENCOUNTER — Telehealth: Payer: Self-pay | Admitting: Physical Medicine and Rehabilitation

## 2023-08-31 ENCOUNTER — Telehealth: Payer: Self-pay | Admitting: Orthopedic Surgery

## 2023-08-31 ENCOUNTER — Ambulatory Visit: Payer: Medicare HMO | Admitting: Physical Therapy

## 2023-08-31 DIAGNOSIS — M79604 Pain in right leg: Secondary | ICD-10-CM

## 2023-08-31 DIAGNOSIS — M6281 Muscle weakness (generalized): Secondary | ICD-10-CM

## 2023-08-31 DIAGNOSIS — M5459 Other low back pain: Secondary | ICD-10-CM

## 2023-08-31 DIAGNOSIS — R262 Difficulty in walking, not elsewhere classified: Secondary | ICD-10-CM

## 2023-08-31 DIAGNOSIS — R2681 Unsteadiness on feet: Secondary | ICD-10-CM

## 2023-08-31 NOTE — Telephone Encounter (Signed)
 Patient called she would like a gel injection in her R hip. Her cb# [phone]

## 2023-08-31 NOTE — Telephone Encounter (Signed)
 Patient called. Would like an injection in her hip. Cb# [phone]

## 2023-08-31 NOTE — Therapy (Signed)
 OUTPATIENT PHYSICAL THERAPY TREATMENT/Discharge PHYSICAL THERAPY DISCHARGE SUMMARY  Visits from Start of Care: 6  Current functional level related to goals / functional outcomes: See below   Remaining deficits: See below   Education / Equipment: HEP  Plan:  Patient goals were not  met. Patient is being discharged due to lack of progress with her pain levels from PT. PT recommending she continue with HEP and independent gym program to maintain her strength and ROM.      Patient Name: Barbara Patton MRN: [id number] DOB:[birth year], 83 y.o., female Today's Date: 08/31/2023   PT End of Session - 08/31/23 1111     Visit Number 6    Number of Visits 7    Date for PT Re-Evaluation 08/24/23    Authorization Type [employer]    Authorization Time Period 07/13/23 to 08/24/23    Progress Note Due on Visit 10    PT Start Time 1106    PT Stop Time 1145    PT Time Calculation (min) 39 min    Activity Tolerance Patient tolerated treatment well    Behavior During Therapy [facility] for tasks assessed/performed               Past Medical History:  Diagnosis Date   Glaucoma    High cholesterol    Hypertension    Lupus    Thyroid disease    Past Surgical History:  Procedure Laterality Date   ABDOMINAL HYSTERECTOMY     CATARACT EXTRACTION, BILATERAL     TOTAL KNEE ARTHROPLASTY Bilateral    TUBAL LIGATION     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   Menopausal symptom 10/20/2021   Overweight 10/20/2021   History of colonic polyps 10/20/2021   Urinary incontinence 10/20/2021   Globus pharyngeus 05/23/2021   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   Referred otalgia of left ear 08/23/2015   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   Allergic rhinitis 07/26/2015   Anxiety 07/26/2015   Benign essential  hypertension 07/26/2015   Dysfunction of left eustachian tube 07/26/2015   Esophageal reflux 07/26/2015   Fibromyalgia 07/26/2015   Pure hypercholesterolemia 07/26/2015   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 05/03/2014   Pes anserinus bursitis of left knee 12/04/2013   Trochanteric bursitis of left hip 08/07/2013   Leg swelling 12/06/2011   Glaucoma 11/02/2011   HLD (hyperlipidemia) 11/02/2011   HTN (hypertension) 11/02/2011   Hypothyroid 11/02/2011   OA (osteoarthritis) 11/02/2011   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) 03/16/2011   Presence of artificial knee joint 03/16/2011    PCP: Renford Dills MD   REFERRING PROVIDER: Julieanne Cotton, PA-C  REFERRING DIAG: Diagnosis M25.552 (ICD-10-CM) - Pain in left hip M54.16 (ICD-10-CM) - Lumbar radiculopathy  Rationale for Evaluation and Treatment: Rehabilitation  THERAPY DIAG:  Pain in right leg  Muscle weakness (generalized)  Difficulty in walking, not elsewhere classified  Unsteadiness on feet  Other low back pain  ONSET DATE: chronic   SUBJECTIVE:  SUBJECTIVE STATEMENT: She did see MD and got injection in her left hip which helped some, she would also like one for the right hip since it helped her left hip, so PT recommended she reach back out to MD to see if this is possible. She has [employer] so can work out there at conclusion of PT today  PERTINENT HISTORY:  See above   PAIN:  Are you having pain? Yes: NPRS scale: 5/10 in right leg, 3/10 in left leg after injection  Pain location: low back Pain description: side of R LE  Aggravating factors: moving R LE in general, transfers   Relieving factors: none known   PRECAUTIONS: None  RED  FLAGS: None   WEIGHT BEARING RESTRICTIONS: No  FALLS:  Has patient fallen in last 6 months? No  LIVING ENVIRONMENT: Lives with: lives with their daughter Lives in: House/apartment Stairs: no steps  Has following equipment at home: Single point cane and "walker but I don't know if it has 2 wheels or 4"   OCCUPATION: retired   PLOF: Independent, Independent with basic ADLs, Independent with gait, and Independent with transfers  PATIENT GOALS: to not have any pain   NEXT MD VISIT: not scheduled/PRN   OBJECTIVE:  Note: Objective measures were completed at Evaluation unless otherwise noted.  DIAGNOSTIC FINDINGS:   FINDINGS: No evidence for an acute fracture in the bony pelvis. SI joints and symphysis pubis unremarkable. Joint space in the right hip is relatively well preserved. There is loss of joint space in the left hip with degenerative spurring. No evidence for left femoral neck fracture.   IMPRESSION: 1. No acute bony findings. 2. Degenerative changes in the left hip.    CLINICAL DATA:  Left leg pain, denies injury or fall   EXAM: LEFT FEMUR PORTABLE 1 VIEW   COMPARISON:  10/01/2022   FINDINGS: No acute fracture or dislocation. Moderate degenerative arthritis left hip. Partially visualized left TKA.   IMPRESSION: No acute fracture or dislocation. Moderate degenerative arthritis left hip.  AP, frog lateral view of left hip reviewed.  Moderate degenerative changes  noted of the left hip joint.  No fracture or dislocation.  EXAM: MRI LUMBAR SPINE WITHOUT CONTRAST   TECHNIQUE: Multiplanar, multisequence MR imaging of the lumbar spine was performed. No intravenous contrast was administered.   COMPARISON:  Radiographs 10/01/2022 and 07/31/2022.  MRI 12/04/2019.   FINDINGS: Segmentation: Conventional anatomy assumed, with the last open disc space designated L5-S1.Concordant with prior imaging.   Alignment: Grade 1 anterolisthesis at L3-4 and L4-5,  similar to previous MRI.   Vertebrae: There are mild superior endplate compression fractures at the L3 and L4 levels, resulting in approximately 10% loss of vertebral body height. There is associated bone marrow edema, but no osseous retropulsion or definite involvement of the posterior elements. These fractures are not clearly seen on recent or prior radiographs. No other acute osseous findings. The visualized sacroiliac joints appear unremarkable.   Conus medullaris: Extends to the L1-2 level and appears normal.   Paraspinal and other soft tissues: No acute paraspinal findings. The urinary bladder is noted to be distended and moderately trabeculated. 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   Disc levels:   Sagittal images demonstrate no significant disc space findings within the visualized lower thoracic spine. There is mild disc bulging at T11-12 and T12-L1.   L1-2: Preserved disc height. Mild facet hypertrophy. No spinal stenosis or nerve root encroachment.   L2-3: Preserved disc height with mild  disc bulging and mild-to-moderate facet hypertrophy. No spinal stenosis or nerve root encroachment.   L3-4: Moderate to severe facet hypertrophy accounts for the grade 1 anterolisthesis and contributes to annular disc bulging and uncovering. There is resulting moderate multifactorial spinal stenosis with mild lateral recess and minimal foraminal narrowing bilaterally. The spinal stenosis has progressed from the previous MRI   L4-5: Moderate to severe facet hypertrophy accounts for the grade 1 anterolisthesis and contributes to annular disc bulging and uncovering. There is only resulting borderline spinal stenosis at this level with minimal left lateral recess and left foraminal narrowing. No nerve root encroachment. No significant changes are seen at this level.   L5-S1: Chronic disc bulging with endplate osteophytes asymmetric to the left. Mild facet hypertrophy.  No spinal stenosis or nerve root encroachment.   IMPRESSION: 1. Acute/subacute mild superior endplate compression fractures at L3 and L4 with associated bone marrow edema, but no osseous retropulsion. 2. 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 3. Borderline spinal stenosis at L4-5 secondary to facet hypertrophy and a grade 1 anterolisthesis. 4. Distended urinary bladder with trabeculation, suggesting chronic bladder outlet obstruction.  Three-view radiographs of her right knee were reviewed today she is status  post total right knee arthroplasty components remain in stable position  when compared to previous x-rays.  No evidence of any periprosthetic  Fracture  THORACIC SPINE 2 VIEWS   COMPARISON:  None Available.   FINDINGS: No acute fracture or traumatic malalignment. Degenerative disc narrowing with endplate spurring in the mid and lower thoracic spine. Maintained posterior mediastinal fat planes. Mild thoracic levocurvature.   IMPRESSION: Degenerative changes without acute or focal finding.      PATIENT SURVEYS:  Eval: FOTO 45, predicted 69 in 9 visits   08/31/23 FOTO 43% functional  COGNITION: Overall cognitive status: Within functional limits for tasks assessed     SENSATION: Not tested  MUSCLE LENGTH:   HS mild limitation B (tested in sitting) Piriformis severe limitation B   POSTURE: rounded shoulders, forward head, decreased lumbar lordosis, and increased thoracic kyphosis    LUMBAR ROM:   AROM eval  Flexion   Extension   Right lateral flexion   Left lateral flexion   Right rotation   Left rotation    (Blank rows = not tested)   LOWER EXTREMITY MMT:    MMT Right eval Left eval  Hip flexion 3- 3-  Hip extension    Hip abduction    Hip adduction    Hip internal rotation    Hip external rotation    Knee flexion 4 4  Knee extension 4+ 4+  Ankle dorsiflexion    Ankle  plantarflexion    Ankle inversion    Ankle eversion     (Blank rows = not tested)   TREATMENT DATE:  08/31/23 Pt seen for aquatic therapy today.  Treatment took place in water 3.5-4.75 ft in depth at the [facility] pool. Temp of water was 91.  Pt entered/exited the pool via stairs with  rail.   Pt requires the buoyancy and hydrostatic pressure of water for support, and to offload joints by unweighting joint load by at least 50 % in navel deep water and by at least 75-80% in chest to neck deep water.  Viscosity of the water is needed for resistance of strengthening. Water current perturbations provides challenge to standing balance requiring increased core activation. Aquatic exercises Lateral walking, forward walking, retro walking 3 round trips without UE  support Leg swings add/abd X 15 bilat with UE support cues for slow and controlled movement Leg swings flexion/extension X 15 bilat with UE support cues for slow and controlled movements Marches X 15 bilat Hip circles X 15 CW,CCW bilat Heel and toe raises X 15 bilat Standing arm rotation circles/stir the pot with dumbells X15 bilat Standing shoulder extensions with water dumbell X 15 bilat Sit to stand  X20 from pool bench Tandem walk with dumbells 2 round trips at pool Sit to stand at pool bench 2X10 Lumbar L stretch holding on at pool wall 5 sec X 10 PATIENT EDUCATION:  Education details: see above  Person educated: Patient Education method: Programmer, multimedia, Demonstration, and Handouts Education comprehension: verbalized understanding, returned demonstration, and needs further education  HOME EXERCISE PROGRAM:  TBD  ASSESSMENT:  CLINICAL IMPRESSION: She has not had much success lately with land based PT managing her chronic pain symptoms so we tried series of aquatic PT this time. She attended 6 visits but no significant relief in pain. Her FOTO functional score decreased.  Patient goals were not met. Patient is being  discharged due to lack of progress with her pain levels from PT. PT recommending she continue with HEP and independent gym program to maintain her strength and ROM. She did get some relief from recent hip injection so PT encouraged her to reach out to MD about maybe getting one for her Rt hip as well.  OBJECTIVE IMPAIRMENTS: Abnormal gait, difficulty walking, decreased ROM, decreased strength, increased fascial restrictions, increased muscle spasms, impaired flexibility, impaired sensation, improper body mechanics, postural dysfunction, obesity, and pain.   ACTIVITY LIMITATIONS: carrying, lifting, sitting, standing, transfers, bed mobility, and locomotion level  PARTICIPATION LIMITATIONS: meal prep, cleaning, laundry, driving, shopping, community activity, and occupation  PERSONAL FACTORS: Age, Behavior pattern, Education, Past/current experiences, Social background, and Time since onset of injury/illness/exacerbation are also affecting patient's functional outcome.   REHAB POTENTIAL: Poor pain/sx did not respond to multiple past courses of  skilled land based PT services   CLINICAL DECISION MAKING: Evolving/moderate complexity  EVALUATION COMPLEXITY: Moderate   GOALS: Goals reviewed with patient? No  SHORT TERM GOALS: Target date: STGs = LTGs     LONG TERM GOALS: Target date: 08/24/2023    MMT to have improved by one grade in all weak groups  Baseline:  Goal status: not met 08/31/23  2.  Pain to be no more than 6/10 at worst  Baseline:  Goal status: not met 08/31/23  3.  Will demonstrate good biomechanics for bed mobility and floor to waist lifting  Baseline:  Goal status:not met 08/31/23  4.  General hip flexibility to be no more than Moderately limited  Baseline:  Goal status: not met 08/31/23    PLAN:  PT FREQUENCY: 1-2x/week  PT DURATION: 6 weeks  PLANNED INTERVENTIONS: 97164- PT Re-evaluation, 97110-Therapeutic exercises, 97530- Therapeutic activity, 97112-  Neuromuscular re-education, 97535- Self Care, 95621- Manual therapy, [id number]- Aquatic Therapy, and Dry Needling.  PLAN FOR NEXT SESSION: recommend discharge to independent program and follow back up with MD  Ivery Quale, PT, DPT 08/31/23 11:12 AM    Referring diagnosis? M25.552, M54. 16 Treatment diagnosis? (if different than referring diagnosis) M79.604, M62.81, R26.2, R26.81, M54.59, R29.3 What was this (referring dx) caused by? []  Surgery []  Fall [x]  Ongoing issue []  Arthritis []  Other: ____________  Laterality: []  Rt []  Lt [x]  Both  Check all possible CPT codes:  *CHOOSE 10 OR LESS*    See Planned Interventions listed in  the Plan section of the Evaluation.

## 2023-09-03 ENCOUNTER — Ambulatory Visit: Payer: Medicare HMO

## 2023-09-03 ENCOUNTER — Encounter: Payer: Self-pay | Admitting: Physician Assistant

## 2023-09-03 ENCOUNTER — Ambulatory Visit (INDEPENDENT_AMBULATORY_CARE_PROVIDER_SITE_OTHER): Payer: Medicare HMO | Admitting: Physician Assistant

## 2023-09-03 ENCOUNTER — Encounter: Payer: Self-pay | Admitting: Psychology

## 2023-09-03 ENCOUNTER — Other Ambulatory Visit

## 2023-09-03 VITALS — BP 132/57 | HR 90 | Resp 20 | Ht 66.0 in | Wt 196.0 lb

## 2023-09-03 DIAGNOSIS — G3184 Mild cognitive impairment, so stated: Secondary | ICD-10-CM | POA: Insufficient documentation

## 2023-09-03 DIAGNOSIS — R413 Other amnesia: Secondary | ICD-10-CM | POA: Diagnosis not present

## 2023-09-03 MED ORDER — DONEPEZIL HCL 10 MG PO TABS: ORAL_TABLET | ORAL | 11 refills | Status: DC

## 2023-09-03 NOTE — Progress Notes (Signed)
 Assessment/Plan:     Barbara Patton is a very pleasant 83 y.o. year old RH female with a history of hypertension, hyperlipidemia, hand pain with paresthesias, tremors, seen today for evaluation of memory loss. MoCA today is 19/30 .  Workup is in progress.However, memory impairment is noted. Patient is able to participate on ADLs . No longer drives. Mood is good.     Memory Impairment  MRI brain without contrast to assess for underlying structural abnormality and assess vascular load  Given that no bradycardia is noted today, will start donepezil 10 mg daily, side  effects discussed Neurocognitive testing to further evaluate cognitive concerns and determine other underlying cause of memory changes, including potential contribution from sleep, anxiety, attention, or depression among others  Check B12, TSH Recommend good control of cardiovascular risk factors.   Continue to control mood as per PCP Folllow up in 4 months      1.  Hand pain and paresthesias             -EMG negative             -MRI cervical spine unremarkable             Follow with pain management with [facility]   2.  Tremor         Controlled with  primidone 50 mg bid             -on Lyrica for hand pain               3. Bradycardia, resolved Pulse at the office today 90 Follow up with PCP    Subjective:    The patient is here alone    How long did patient have memory difficulties?  For about 1 year.  Patient reports some difficulty remembering new information, recent conversations, names. Both STM and LTM are affected repeats oneself?  Denies Disoriented when walking into a room?  Patient denies expect for sometimes not remembering what she was in the kitchen for.  Leaving objects in unusual places?  denies   Wandering behavior? denies   Any personality changes, or depression, anxiety? Denies. Hallucinations or paranoia? Denies." I am missing a ring and she claims she did not touch it"   Seizures?  denies    Any sleep changes?  Sleeps well "but not as long as I want to, the pain wakes me up". Reports vivid dreams but mostly about family, REM behavior or sleepwalking   Sleep apnea? Denies.   Any hygiene concerns?  Denies.   Independent of bathing and dressing? Endorsed  Does the patient need help with medications? Patient is in charge   Who is in charge of the finances? Patient is in charge    Any changes in appetite?   Denies. Appetite is good.     Patient have trouble swallowing?  Denies.   Does the patient cook? Yes, denies any kitchen accidents. Any headaches?  Denies.   Chronic pain? Denies.   Ambulates with difficulty? Denies, but needs a cane to ambulate for stability.   Recent falls or head injuries?  She had 2 mechanical falls, one she was bending over and fell. Another time she was getting out of the commode and I didn't hold myself well. No LOC or head injury Vision changes?  Denies any new issues.  Has a history of "Lupus in the L eye, I take medicine for the last 2 years".  Any strokelike symptoms? Denies.   Any  tremors? "Not recently at rest, only when I try to pick up something" Any anosmia? Denies.   Any incontinence of urine?Endorsed, has residual. "If I go, I have to go back, need diapers" Any bowel dysfunction? Denies.      Daughter lives with her History of heavy alcohol intake? One 8 oz wine 5 days a week.   History of heavy tobacco use? Denies.   Family history of dementia? Denies  Does patient drive? 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   Allergies  Allergen Reactions   Codeine Other (See Comments)    Kept awake   Penicillin G Other (See Comments)   Penicillins Hives    Did it involve swelling of the face/tongue/throat, SOB, or low BP? N Did it involve sudden or severe rash/hives, skin peeling, or any reaction on the inside of your mouth or nose? Y Did you need to seek medical  attention at a hospital or doctor's office? N When did it last happen?   Over 10 Years Ago    If all above answers are "NO", may proceed with cephalosporin use.     Current Outpatient Medications  Medication Instructions   acetaminophen (TYLENOL) 650 mg, 2 times daily PRN   Cholecalciferol (VITAMIN D-3 PO) 1 tablet, Daily   diclofenac Sodium (VOLTAREN) 1 % GEL    donepezil (ARICEPT) 10 MG tablet Take half tablet (5 mg) daily for 2 weeks, then increase to the full tablet at 10 mg daily   DULoxetine (CYMBALTA) 30 MG capsule No dose, route, or frequency recorded.   Ergocalciferol 62.5 MCG (2500 UT) CAPS 1 po daily x 3 weeks and then weekly x 16 weeks   furosemide (LASIX) 40 mg, Daily   hydroxychloroquine (PLAQUENIL) 200 mg, Daily   hydrOXYzine (ATARAX) 10 mg, Every 6 hours PRN   levothyroxine (SYNTHROID) 100 mcg, Daily   Multiple Vitamins-Minerals (MULTIVITAMIN PO) 1 tablet, Daily   omeprazole (PRILOSEC) 40 mg, Daily   primidone (MYSOLINE) 50 mg, Oral, 2 times daily   simvastatin (ZOCOR) 20 mg, Every evening   triamcinolone cream (KENALOG) 0.1 % 1 Application, Topical, 2 times daily     VITALS:   Vitals:   09/03/23 1003  BP: (!) 132/57  Pulse: 90  Resp: 20  SpO2: 98%  Weight: 196 lb (88.9 kg)  Height: 5\' 6"  (1.676 m)      PHYSICAL EXAM   HEENT:  Normocephalic, atraumatic.  The superficial temporal arteries are without ropiness or tenderness. Cardiovascular: Regular rate and rhythm. Lungs: Clear to auscultation bilaterally. Neck: There are no carotid bruits noted bilaterally.  NEUROLOGICAL:    09/03/2023    1:00 PM  Montreal Cognitive Assessment   Visuospatial/ Executive (0/5) 2  Naming (0/3) 2  Attention: Read list of digits (0/2) 2  Attention: Read list of letters (0/1) 1  Attention: Serial 7 subtraction starting at 100 (0/3) 2  Language: Repeat phrase (0/2) 1  Language : Fluency (0/1) 0  Abstraction (0/2) 0  Delayed Recall (0/5) 2  Orientation (0/6) 6  Total  18  Adjusted Score (based on education) 19        No data to display           Orientation:  Alert and oriented to person, place and time . No aphasia or dysarthria. Fund of knowledge is appropriate. Recent and remote memory impaired.  Attention and concentration are reduced .  Able  to name objects and repeat phrases1/2 . Delayed recall  2/5 Cranial nerves: There is good facial symmetry. Extraocular muscles are intact and visual fields are full to confrontational testing. Speech is fluent and clear. No tongue deviation. Hearing is intact to conversational tone.  Tone: Tone is good throughout. No cogwheeling.  Abnormal movements: no tremors today, no asterixis, no fasciculations Sensation: Sensation is intact to light touch. She  can feel her digits.  Vibration is intact at the bilateral big toe.  Coordination: The patient has no difficulty with RAM's or FNF bilaterally. Normal finger to nose  Motor: Strength is 5/5 in the bilateral upper and lower extremities. There is no pronator drift. There are no fasciculations noted. DTR's: Deep tendon reflexes are 2/4 bilaterally. Gait and Station: The patient is able to ambulate without difficulty. Gait is cautious and narrow. Stride length is normal       Thank you for allowing [place] the opportunity to participate in the care of this nice patient. Please do not hesitate to contact us for any questions or concerns.   Total time spent on today's visit was 46 minutes dedicated to this patient today, preparing to see patient, examining the patient, ordering tests and/or medications and counseling the patient, documenting clinical information in the EHR or other health record, independently interpreting results and communicating results to the patient/family, discussing treatment and goals, answering patient's questions and coordinating care.  Cc:  Renford Dills, MD  Marlowe Kays 09/03/2023 1:42 PM

## 2023-09-03 NOTE — Patient Instructions (Signed)
 It was a pleasure to see you today at our office.   Recommendations:  Neurocognitive evaluation at our office  MRI of the brain, the radiology office will call you to arrange you appointment  Check labs today  Follow up in 4 months Start Donepezil 10 mg :Take half tablet (5 mg) daily for 2 weeks, then increase to the full tablet at 10 mg daily. Side effects discussed   Recommend visiting the website : " Dementia [facility]" to better understand some behaviors related to memory loss.    For psychiatric meds, mood meds: Please have your primary care physician manage these medications.  If you have any severe symptoms of a stroke, or other severe issues such as confusion,severe chills or fever, etc call 911 or go to the ER as you may need to be evaluated further   For guidance regarding [facility] and if placement were needed at the facility, contact Social Worker tel: [phone]  For assessment of decision of mental capacity and competency:  Call Dr. Erick Blinks, geriatric psychiatrist at [phone]  Counseling regarding caregiver distress, including caregiver depression, anxiety and issues regarding community resources, adult day care programs, adult living facilities, or memory care questions:  please contact your  Primary Doctor's Social Worker   Whom to call: Memory  decline, memory medications: Call our office [phone]    [email or web address]   RECOMMENDATIONS FOR ALL PATIENTS WITH MEMORY PROBLEMS: 1. Continue to exercise (Recommend 30 minutes of walking everyday, or 3 hours every week) 2. Increase social interactions - continue going to [place] and enjoy social gatherings with friends and family 3. Eat healthy, avoid fried foods and eat more fruits and vegetables 4. Maintain adequate blood pressure, blood sugar, and blood cholesterol level. Reducing the risk of stroke and cardiovascular disease  also helps promoting better memory. 5. Avoid stressful situations. Live a simple life and avoid aggravations. Organize your time and prepare for the next day in anticipation. 6. Sleep well, avoid any interruptions of sleep and avoid any distractions in the bedroom that may interfere with adequate sleep quality 7. Avoid sugar, avoid sweets as there is a strong link between excessive sugar intake, diabetes, and cognitive impairment We discussed the Mediterranean diet, which has been shown to help patients reduce the risk of progressive memory disorders and reduces cardiovascular risk. This includes eating fish, eat fruits and green leafy vegetables, nuts like almonds and hazelnuts, walnuts, and also use olive oil. Avoid fast foods and fried foods as much as possible. Avoid sweets and sugar as sugar use has been linked to worsening of memory function.  There is always a concern of gradual progression of memory problems. If this is the case, then we may need to adjust level of care according to patient needs. Support, both to the patient and caregiver, should then be put into place.      You have been referred for a neuropsychological evaluation (i.e., evaluation of memory and thinking abilities). Please bring someone with you to this appointment if possible, as it is helpful for the doctor to hear from both you and another adult who knows you well. Please bring eyeglasses and hearing aids if you wear them.    The evaluation will take approximately 3 hours and has two parts:   The first part is a clinical interview with the neuropsychologist (Dr. Milbert Coulter or Dr. Robbie Lis). During the interview, the neuropsychologist will speak with you and the individual you brought to the appointment.  The second part of the evaluation is testing with the doctor's technician Annabelle Harman or Selena Batten). During the testing, the technician will ask you to remember different types of material, solve problems, and answer some  questionnaires. Your family member will not be present for this portion of the evaluation.   Please note: We must reserve several hours of the neuropsychologist's time and the psychometrician's time for your evaluation appointment. As such, there is a No-Show fee of $100. If you are unable to attend any of your appointments, please contact our office as soon as possible to reschedule.      DRIVING: Regarding driving, in patients with progressive memory problems, driving will be impaired. We advise to have someone else do the driving if trouble finding directions or if minor accidents are reported. Independent driving assessment is available to determine safety of driving.   If you are interested in the driving assessment, you can contact the following:  [facility] in [place] [phone]  [facility] [phone]  [facility] [phone]  [facility] [phone] or [phone]   FALL PRECAUTIONS: Be cautious when walking. Scan the area for obstacles that may increase the risk of trips and falls. When getting up in the mornings, sit up at the edge of the bed for a few minutes before getting out of bed. Consider elevating the bed at the head end to avoid drop of blood pressure when getting up. Walk always in a well-lit room (use night lights in the walls). Avoid area rugs or power cords from appliances in the middle of the walkways. Use a walker or a cane if necessary and consider physical therapy for balance exercise. Get your eyesight checked regularly.  FINANCIAL OVERSIGHT: Supervision, especially oversight when making financial decisions or transactions is also recommended.  HOME SAFETY: Consider the safety of the kitchen when operating appliances like stoves, microwave oven, and blender. Consider having supervision and share cooking responsibilities until no longer able to participate in those. Accidents with firearms and other hazards  in the house should be identified and addressed as well.   ABILITY TO BE LEFT ALONE: If patient is unable to contact 911 operator, consider using LifeLine, or when the need is there, arrange for someone to stay with patients. Smoking is a fire hazard, consider supervision or cessation. Risk of wandering should be assessed by caregiver and if detected at any point, supervision and safe proof recommendations should be instituted.  MEDICATION SUPERVISION: Inability to self-administer medication needs to be constantly addressed. Implement a mechanism to ensure safe administration of the medications.      Mediterranean Diet A Mediterranean diet refers to food and lifestyle choices that are based on the traditions of countries located on the [employer]. This way of eating has been shown to help prevent certain conditions and improve outcomes for people who have chronic diseases, like kidney disease and heart disease. What are tips for following this plan? Lifestyle  Cook and eat meals together with your family, when possible. Drink enough fluid to keep your urine clear or pale yellow. Be physically active every day. This includes: Aerobic exercise like running or swimming. Leisure activities like gardening, walking, or housework. Get 7-8 hours of sleep each night. If recommended by your health care provider, drink red wine in moderation. This means 1 glass a day for nonpregnant women and 2 glasses a day for men. A glass of wine equals 5 oz (150 mL). Reading food labels  Check the serving size  of packaged foods. For foods such as rice and pasta, the serving size refers to the amount of cooked product, not dry. Check the total fat in packaged foods. Avoid foods that have saturated fat or trans fats. Check the ingredients list for added sugars, such as corn syrup. Shopping  At the grocery store, buy most of your food from the areas near the walls of the store. This includes: Fresh fruits  and vegetables (produce). Grains, beans, nuts, and seeds. Some of these may be available in unpackaged forms or large amounts (in bulk). Fresh seafood. Poultry and eggs. Low-fat dairy products. Buy whole ingredients instead of prepackaged foods. Buy fresh fruits and vegetables in-season from local farmers markets. Buy frozen fruits and vegetables in resealable bags. If you do not have access to quality fresh seafood, buy precooked frozen shrimp or canned fish, such as tuna, salmon, or sardines. Buy small amounts of raw or cooked vegetables, salads, or olives from the deli or salad bar at your store. Stock your pantry so you always have certain foods on hand, such as olive oil, canned tuna, canned tomatoes, rice, pasta, and beans. Cooking  Cook foods with extra-virgin olive oil instead of using butter or other vegetable oils. Have meat as a side dish, and have vegetables or grains as your main dish. This means having meat in small portions or adding small amounts of meat to foods like pasta or stew. Use beans or vegetables instead of meat in common dishes like chili or lasagna. Experiment with different cooking methods. Try roasting or broiling vegetables instead of steaming or sauteing them. Add frozen vegetables to soups, stews, pasta, or rice. Add nuts or seeds for added healthy fat at each meal. You can add these to yogurt, salads, or vegetable dishes. Marinate fish or vegetables using olive oil, lemon juice, garlic, and fresh herbs. Meal planning  Plan to eat 1 vegetarian meal one day each week. Try to work up to 2 vegetarian meals, if possible. Eat seafood 2 or more times a week. Have healthy snacks readily available, such as: Vegetable sticks with hummus. Greek yogurt. Fruit and nut trail mix. Eat balanced meals throughout the week. This includes: Fruit: 2-3 servings a day Vegetables: 4-5 servings a day Low-fat dairy: 2 servings a day Fish, poultry, or lean meat: 1 serving a  day Beans and legumes: 2 or more servings a week Nuts and seeds: 1-2 servings a day Whole grains: 6-8 servings a day Extra-virgin olive oil: 3-4 servings a day Limit red meat and sweets to only a few servings a month What are my food choices? Mediterranean diet Recommended Grains: Whole-grain pasta. Brown rice. Bulgar wheat. Polenta. Couscous. Whole-wheat bread. Orpah Cobb. Vegetables: Artichokes. Beets. Broccoli. Cabbage. Carrots. Eggplant. Green beans. Chard. Kale. Spinach. Onions. Leeks. Peas. Squash. Tomatoes. Peppers. Radishes. Fruits: Apples. Apricots. Avocado. Berries. Bananas. Cherries. Dates. Figs. Grapes. Lemons. Melon. Oranges. Peaches. Plums. Pomegranate. Meats and other protein foods: Beans. Almonds. Sunflower seeds. Pine nuts. Peanuts. Cod. Salmon. Scallops. Shrimp. Tuna. Tilapia. Clams. Oysters. Eggs. Dairy: Low-fat milk. Cheese. Greek yogurt. Beverages: Water. Red wine. Herbal tea. Fats and oils: Extra virgin olive oil. Avocado oil. Grape seed oil. Sweets and desserts: [place] yogurt with honey. Baked apples. Poached pears. Trail mix. Seasoning and other foods: Basil. Cilantro. Coriander. Cumin. Mint. Parsley. Sage. Rosemary. Tarragon. Garlic. Oregano. Thyme. Pepper. Balsalmic vinegar. Tahini. Hummus. Tomato sauce. Olives. Mushrooms. Limit these Grains: Prepackaged pasta or rice dishes. Prepackaged cereal with added sugar. Vegetables: Deep fried potatoes (french fries).  Fruits: Fruit canned in syrup. Meats and other protein foods: Beef. Pork. Lamb. Poultry with skin. Hot dogs. Tomasa Blase. Dairy: Ice cream. Sour cream. Whole milk. Beverages: Juice. Sugar-sweetened soft drinks. Beer. Liquor and spirits. Fats and oils: Butter. Canola oil. Vegetable oil. Beef fat (tallow). Lard. Sweets and desserts: Cookies. Cakes. Pies. Candy. Seasoning and other foods: Mayonnaise. Premade sauces and marinades. The items listed may not be a complete list. Talk with your dietitian about what  dietary choices are right for you. Summary The Mediterranean diet includes both food and lifestyle choices. Eat a variety of fresh fruits and vegetables, beans, nuts, seeds, and whole grains. Limit the amount of red meat and sweets that you eat. Talk with your health care provider about whether it is safe for you to drink red wine in moderation. This means 1 glass a day for nonpregnant women and 2 glasses a day for men. A glass of wine equals 5 oz (150 mL). This information is not intended to replace advice given to you by your health care provider. Make sure you discuss any questions you have with your health care provider. Document Released: 01/13/2016 Document Revised: 02/15/2016 Document Reviewed: 01/13/2016 Elsevier Interactive Patient Education  2017 [employer].

## 2023-09-04 ENCOUNTER — Other Ambulatory Visit: Payer: Self-pay | Admitting: Physician Assistant

## 2023-09-04 MED ORDER — MEMANTINE HCL 10 MG PO TABS: ORAL_TABLET | ORAL | 11 refills | Status: DC

## 2023-09-04 NOTE — Progress Notes (Signed)
 B12 and thyroid levels are normal, thanks

## 2023-09-06 LAB — VITAMIN B1: Vitamin B1 (Thiamine): 20 nmol/L (ref 8–30)

## 2023-09-06 LAB — VITAMIN B12: Vitamin B-12: 1003 pg/mL (ref 200–1100)

## 2023-09-06 LAB — TSH: TSH: 3.89 m[IU]/L (ref 0.40–4.50)

## 2023-09-07 ENCOUNTER — Encounter: Payer: Self-pay | Admitting: Surgical

## 2023-09-07 ENCOUNTER — Ambulatory Visit: Admitting: Surgical

## 2023-09-07 ENCOUNTER — Other Ambulatory Visit: Payer: Self-pay

## 2023-09-07 DIAGNOSIS — M7061 Trochanteric bursitis, right hip: Secondary | ICD-10-CM | POA: Diagnosis not present

## 2023-09-07 DIAGNOSIS — M25551 Pain in right hip: Secondary | ICD-10-CM

## 2023-09-07 MED ORDER — BUPIVACAINE HCL 0.25 % IJ SOLN
4.0000 mL | INTRAMUSCULAR | Status: AC | PRN
  Administered 2023-09-07: 4 mL via INTRA_ARTICULAR

## 2023-09-07 MED ORDER — METHYLPREDNISOLONE ACETATE 40 MG/ML IJ SUSP
40.0000 mg | INTRAMUSCULAR | Status: AC | PRN
Start: 2023-09-07 — End: 2023-09-07
  Administered 2023-09-07: 40 mg via INTRA_ARTICULAR

## 2023-09-07 NOTE — Progress Notes (Signed)
 Office Visit Note   Patient: Barbara Patton           Date of Birth: Mar 13, 1941           MRN: [id number] Visit Date: 09/07/2023 Requested by: Renford Dills, MD [address] [place],  [place] [address] PCP: Renford Dills, MD  Subjective: Chief Complaint  Patient presents with   Right Hip - Pain    HPI: Barbara Patton is a 83 y.o. female who presents to the office reporting right hip pain.  Patient localizes pain to the lateral aspect of the hip.  She had recent left hip intra-articular injection under ultrasound on 3/24 that gave her great relief of her left hip pain.  Her right hip pain she primarily localizes to the lateral aspect with some radiation into the thigh.  No significant groin pain.  Pain is worse with weightbearing.  No numbness.  Pain is not constant.  No recent fall or injury.  Has had prior trochanteric injections in the past with good relief.  Has x-rays of her pelvis that demonstrated moderate left hip arthritis with no significant right hip arthritis..                ROS: All systems reviewed are negative as they relate to the chief complaint within the history of present illness.  Patient denies fevers or chills.  Assessment & Plan: Visit Diagnoses:  1. Greater trochanteric bursitis, right   2. Pain in right hip     Plan: Impression is 83 year old female with right hip trochanteric bursitis.  She has had similar issues in the past and would like to repeat trochanteric injection.  I think this would be likely more helpful for her than intra-articular injection like she had on her left side due to the difference in degenerative changes that she has between the left and right hips.  Tolerated injection well under ultrasound guidance.  Care taken to avoid injection into the gluteal tendon structures.  We will see her back as needed but recommended that patient reach out in about 2 weeks to let us know how this injection did.  If no lasting improvement,  could consider intra-articular injection.  Follow-Up Instructions: No follow-ups on file.   Orders:  Orders Placed This Encounter  Procedures   US Guided Needle Placement - No Linked Charges   No orders of the defined types were placed in this encounter.     Procedures: Large Joint Inj: R hip joint on 09/07/2023 2:16 PM Indications: pain and diagnostic evaluation Details: 22 G 3.5 in needle, ultrasound-guided anterior approach Medications: 4 mL bupivacaine 0.25 %; 40 mg methylPREDNISolone acetate 40 MG/ML Outcome: tolerated well, no immediate complications Procedure, treatment alternatives, risks and benefits explained, specific risks discussed. Consent was given by the patient. Immediately prior to procedure a time out was called to verify the correct patient, procedure, equipment, support staff and site/side marked as required. Patient was prepped and draped in the usual sterile fashion.       Clinical Data: No additional findings.  Objective: Vital Signs: There were no vitals taken for this visit.  Physical Exam:  Constitutional: Patient appears well-developed HEENT:  Head: Normocephalic Eyes:EOM are normal Neck: Normal range of motion Cardiovascular: Normal rate Pulmonary/chest: Effort normal Neurologic: Patient is alert Skin: Skin is warm Psychiatric: Patient has normal mood and affect  Ortho Exam: Ortho exam demonstrates negative FADIR sign.  Negative central sign.  She does have tenderness over the greater  trochanter of the right hip rated moderate primarily in the posterior aspect of the trochanter.  She has no Trendelenburg gait.  No cellulitis or skin changes noted in the hip region.  Specialty Comments:  EXAM: MRI LUMBAR SPINE WITHOUT CONTRAST   TECHNIQUE: Multiplanar, multisequence MR imaging of the lumbar spine was performed. No intravenous contrast was administered.   COMPARISON:  Radiographs 10/01/2022 and 07/31/2022.  MRI 12/04/2019.    FINDINGS: Segmentation: Conventional anatomy assumed, with the last open disc space designated L5-S1.Concordant with prior imaging.   Alignment: Grade 1 anterolisthesis at L3-4 and L4-5, similar to previous MRI.   Vertebrae: There are mild superior endplate compression fractures at the L3 and L4 levels, resulting in approximately 10% loss of vertebral body height. There is associated bone marrow edema, but no osseous retropulsion or definite involvement of the posterior elements. These fractures are not clearly seen on recent or prior radiographs. No other acute osseous findings. The visualized sacroiliac joints appear unremarkable.   Conus medullaris: Extends to the L1-2 level and appears normal.   Paraspinal and other soft tissues: No acute paraspinal findings. The urinary bladder is noted to be distended and moderately trabeculated. 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   Disc levels:   Sagittal images demonstrate no significant disc space findings within the visualized lower thoracic spine. There is mild disc bulging at T11-12 and T12-L1.   L1-2: Preserved disc height. Mild facet hypertrophy. No spinal stenosis or nerve root encroachment.   L2-3: Preserved disc height with mild disc bulging and mild-to-moderate facet hypertrophy. No spinal stenosis or nerve root encroachment.   L3-4: Moderate to severe facet hypertrophy accounts for the grade 1 anterolisthesis and contributes to annular disc bulging and uncovering. There is resulting moderate multifactorial spinal stenosis with mild lateral recess and minimal foraminal narrowing bilaterally. The spinal stenosis has progressed from the previous MRI   L4-5: Moderate to severe facet hypertrophy accounts for the grade 1 anterolisthesis and contributes to annular disc bulging and uncovering. There is only resulting borderline spinal stenosis at this level with minimal left lateral recess and left  foraminal narrowing. No nerve root encroachment. No significant changes are seen at this level.   L5-S1: Chronic disc bulging with endplate osteophytes asymmetric to the left. Mild facet hypertrophy. No spinal stenosis or nerve root encroachment.   IMPRESSION: 1. Acute/subacute mild superior endplate compression fractures at L3 and L4 with associated bone marrow edema, but no osseous retropulsion. 2. 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 3. Borderline spinal stenosis at L4-5 secondary to facet hypertrophy and a grade 1 anterolisthesis. 4. Distended urinary bladder with trabeculation, suggesting chronic bladder outlet obstruction.     Electronically Signed   By: Carey Bullocks M.D.   On: 10/26/2022 10:57  Imaging: No results found.   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   Allergic rhinitis 07/26/2015   Anxiety 07/26/2015   Benign essential hypertension 07/26/2015   Dysfunction of left eustachian tube 07/26/2015   Esophageal reflux  07/26/2015   Fibromyalgia 07/26/2015   Pure hypercholesterolemia 07/26/2015   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 05/03/2014   Pes anserinus bursitis of left knee 12/04/2013   Trochanteric bursitis of left hip 08/07/2013   Leg swelling 12/06/2011   Glaucoma 11/02/2011   HLD (hyperlipidemia) 11/02/2011   HTN  (hypertension) 11/02/2011   Hypothyroid 11/02/2011   OA (osteoarthritis) 11/02/2011   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) 03/16/2011   Presence of artificial knee joint 03/16/2011   Past Medical History:  Diagnosis Date   Glaucoma    High cholesterol    Hypertension    Lupus    Thyroid disease     Family History  Problem Relation Age of Onset   Cancer Father    Cancer Brother    Cancer Brother    Stroke Brother     Past Surgical History:  Procedure Laterality Date   ABDOMINAL HYSTERECTOMY     CATARACT EXTRACTION, BILATERAL     TOTAL KNEE ARTHROPLASTY Bilateral    TUBAL LIGATION     Social History   Occupational History   Not on file  Tobacco Use   Smoking status: Former    Current packs/day: 0.00    Types: Cigarettes    Quit date: 1997    Years since quitting: 28.2   Smokeless tobacco: Never  Vaping Use   Vaping status: Never Used  Substance and Sexual Activity   Alcohol use: Yes    Alcohol/week: 3.0 standard drinks of alcohol    Types: 3 Glasses of wine per week    Comment: occ wine   Drug use: No   Sexual activity: Not on file

## 2023-09-11 ENCOUNTER — Encounter: Payer: Self-pay | Admitting: Physician Assistant

## 2023-09-12 ENCOUNTER — Other Ambulatory Visit

## 2023-09-12 DIAGNOSIS — R5383 Other fatigue: Secondary | ICD-10-CM | POA: Diagnosis not present

## 2023-09-12 DIAGNOSIS — M1991 Primary osteoarthritis, unspecified site: Secondary | ICD-10-CM | POA: Diagnosis not present

## 2023-09-12 DIAGNOSIS — M329 Systemic lupus erythematosus, unspecified: Secondary | ICD-10-CM | POA: Diagnosis not present

## 2023-09-12 DIAGNOSIS — H2011 Chronic iridocyclitis, right eye: Secondary | ICD-10-CM | POA: Diagnosis not present

## 2023-09-12 DIAGNOSIS — R21 Rash and other nonspecific skin eruption: Secondary | ICD-10-CM | POA: Diagnosis not present

## 2023-09-12 DIAGNOSIS — Z6830 Body mass index (BMI) 30.0-30.9, adult: Secondary | ICD-10-CM | POA: Diagnosis not present

## 2023-09-12 DIAGNOSIS — M797 Fibromyalgia: Secondary | ICD-10-CM | POA: Diagnosis not present

## 2023-09-12 DIAGNOSIS — E559 Vitamin D deficiency, unspecified: Secondary | ICD-10-CM | POA: Diagnosis not present

## 2023-09-12 DIAGNOSIS — E669 Obesity, unspecified: Secondary | ICD-10-CM | POA: Diagnosis not present

## 2023-09-13 DIAGNOSIS — M25552 Pain in left hip: Secondary | ICD-10-CM | POA: Diagnosis not present

## 2023-09-13 DIAGNOSIS — M25551 Pain in right hip: Secondary | ICD-10-CM | POA: Diagnosis not present

## 2023-09-13 DIAGNOSIS — Z79899 Other long term (current) drug therapy: Secondary | ICD-10-CM | POA: Diagnosis not present

## 2023-09-13 DIAGNOSIS — M47816 Spondylosis without myelopathy or radiculopathy, lumbar region: Secondary | ICD-10-CM | POA: Diagnosis not present

## 2023-09-22 ENCOUNTER — Ambulatory Visit
Admission: RE | Admit: 2023-09-22 | Discharge: 2023-09-22 | Disposition: A | Discharge status: Home / Self Care | Source: Ambulatory Visit | Attending: Physician Assistant | Admitting: Physician Assistant

## 2023-09-22 DIAGNOSIS — G319 Degenerative disease of nervous system, unspecified: Secondary | ICD-10-CM | POA: Diagnosis not present

## 2023-09-22 DIAGNOSIS — R413 Other amnesia: Secondary | ICD-10-CM | POA: Diagnosis not present

## 2023-09-22 DIAGNOSIS — I6782 Cerebral ischemia: Secondary | ICD-10-CM | POA: Diagnosis not present

## 2023-09-24 NOTE — Progress Notes (Signed)
The MRI of the brain  showed mild hardening of the small blood vessels in the brain in patients with high cholesterol, blood pressure or sugar control issues, sometimes age as well. It did not show any tumors, stroke or bleed.Thanks

## 2023-09-25 ENCOUNTER — Telehealth: Payer: Self-pay | Admitting: Physician Assistant

## 2023-09-25 NOTE — Telephone Encounter (Signed)
 Returning a call about her results

## 2023-10-03 DIAGNOSIS — M25551 Pain in right hip: Secondary | ICD-10-CM | POA: Diagnosis not present

## 2023-10-10 DIAGNOSIS — H34812 Central retinal vein occlusion, left eye, with macular edema: Secondary | ICD-10-CM | POA: Diagnosis not present

## 2023-10-10 DIAGNOSIS — Z79899 Other long term (current) drug therapy: Secondary | ICD-10-CM | POA: Diagnosis not present

## 2023-10-10 DIAGNOSIS — H35033 Hypertensive retinopathy, bilateral: Secondary | ICD-10-CM | POA: Diagnosis not present

## 2023-10-10 DIAGNOSIS — H43813 Vitreous degeneration, bilateral: Secondary | ICD-10-CM | POA: Diagnosis not present

## 2023-10-11 ENCOUNTER — Other Ambulatory Visit (HOSPITAL_COMMUNITY): Payer: Self-pay

## 2023-11-05 ENCOUNTER — Other Ambulatory Visit: Payer: Self-pay | Admitting: Neurology

## 2023-11-05 DIAGNOSIS — Z79899 Other long term (current) drug therapy: Secondary | ICD-10-CM | POA: Diagnosis not present

## 2023-11-05 DIAGNOSIS — M25552 Pain in left hip: Secondary | ICD-10-CM | POA: Diagnosis not present

## 2023-11-05 DIAGNOSIS — R251 Tremor, unspecified: Secondary | ICD-10-CM

## 2023-11-05 DIAGNOSIS — M25551 Pain in right hip: Secondary | ICD-10-CM | POA: Diagnosis not present

## 2023-11-07 DIAGNOSIS — E8770 Fluid overload, unspecified: Secondary | ICD-10-CM | POA: Diagnosis not present

## 2023-11-07 DIAGNOSIS — G8929 Other chronic pain: Secondary | ICD-10-CM | POA: Diagnosis not present

## 2023-11-07 DIAGNOSIS — I10 Essential (primary) hypertension: Secondary | ICD-10-CM | POA: Diagnosis not present

## 2023-11-07 DIAGNOSIS — M4800 Spinal stenosis, site unspecified: Secondary | ICD-10-CM | POA: Diagnosis not present

## 2023-11-07 DIAGNOSIS — M797 Fibromyalgia: Secondary | ICD-10-CM | POA: Diagnosis not present

## 2023-11-07 DIAGNOSIS — G3184 Mild cognitive impairment, so stated: Secondary | ICD-10-CM | POA: Diagnosis not present

## 2023-11-07 DIAGNOSIS — E7800 Pure hypercholesterolemia, unspecified: Secondary | ICD-10-CM | POA: Diagnosis not present

## 2023-11-07 DIAGNOSIS — R600 Localized edema: Secondary | ICD-10-CM | POA: Diagnosis not present

## 2023-11-08 ENCOUNTER — Other Ambulatory Visit (HOSPITAL_COMMUNITY): Payer: Self-pay | Admitting: Internal Medicine

## 2023-11-08 ENCOUNTER — Other Ambulatory Visit: Payer: Self-pay

## 2023-11-08 ENCOUNTER — Telehealth: Payer: Self-pay | Admitting: Physician Assistant

## 2023-11-08 DIAGNOSIS — E8770 Fluid overload, unspecified: Secondary | ICD-10-CM

## 2023-11-08 MED ORDER — MEMANTINE HCL 10 MG PO TABS: ORAL_TABLET | ORAL | 1 refills | Status: DC

## 2023-11-08 NOTE — Telephone Encounter (Signed)
Sent to [facility]

## 2023-11-08 NOTE — Telephone Encounter (Signed)
 Left a message with the after hour service on 11-08-23  caller states that she is needing a prescription refill  the dr changed the prescription and is needing the refill    Caller state one is at the [facility] and on is for home delivery

## 2023-11-19 ENCOUNTER — Ambulatory Visit: Payer: Self-pay | Admitting: Psychology

## 2023-11-19 ENCOUNTER — Ambulatory Visit (INDEPENDENT_AMBULATORY_CARE_PROVIDER_SITE_OTHER): Admitting: Psychology

## 2023-11-19 DIAGNOSIS — G3184 Mild cognitive impairment, so stated: Secondary | ICD-10-CM | POA: Diagnosis not present

## 2023-11-19 DIAGNOSIS — R4189 Other symptoms and signs involving cognitive functions and awareness: Secondary | ICD-10-CM

## 2023-11-19 NOTE — Progress Notes (Signed)
   Psychometrician Note   Cognitive testing was administered to Morna Arabian by Arnulfo Larch, B.S. (psychometrist) under the supervision of Dr. Janice Meeter, Psy.D., licensed psychologist on 11/19/2023. Ms. Quiggle did not appear overtly distressed by the testing session per behavioral observation or responses across self-report questionnaires. Rest breaks were offered.    The battery of tests administered was selected by Dr. Janice Meeter, Psy.D. with consideration to Ms. Uram's current level of functioning, the nature of her symptoms, emotional and behavioral responses during interview, level of literacy, observed level of motivation/effort, and the nature of the referral question. This battery was communicated to the psychometrist. Communication between Dr. Janice Meeter, Psy.D. and the psychometrist was ongoing throughout the evaluation and Dr. Janice Meeter, Psy.D. was immediately accessible at all times. Dr. Janice Meeter, Psy.D. provided supervision to the psychometrist on the date of this service to the extent necessary to assure the quality of all services provided.    ALECEA TREGO will return within approximately 1-2 weeks for an interactive feedback session with Dr. Donavon Fudge at which time her test performances, clinical impressions, and treatment recommendations will be reviewed in detail. Ms. Tuccillo understands she can contact our office should she require our assistance before this time.  A total of 135 minutes of billable time were spent face-to-face with Ms. Elena by the psychometrist. This includes both test administration and scoring time. Billing for these services is reflected in the clinical report generated by Dr. Janice Meeter, Psy.D.  This note reflects time spent with the psychometrician and does not include test scores or any clinical interpretations made by Dr. Donavon Fudge. The full report will follow in a separate note.
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
## 2023-11-21 DIAGNOSIS — R600 Localized edema: Secondary | ICD-10-CM | POA: Diagnosis not present

## 2023-11-21 DIAGNOSIS — G3184 Mild cognitive impairment, so stated: Secondary | ICD-10-CM | POA: Diagnosis not present

## 2023-11-21 DIAGNOSIS — E8770 Fluid overload, unspecified: Secondary | ICD-10-CM | POA: Diagnosis not present

## 2023-11-23 ENCOUNTER — Other Ambulatory Visit (HOSPITAL_COMMUNITY): Payer: Self-pay | Admitting: Internal Medicine

## 2023-11-23 ENCOUNTER — Telehealth (HOSPITAL_COMMUNITY): Payer: Self-pay

## 2023-11-23 ENCOUNTER — Ambulatory Visit (HOSPITAL_COMMUNITY)
Admission: RE | Admit: 2023-11-23 | Discharge: 2023-11-23 | Disposition: A | Discharge status: Home / Self Care | Source: Ambulatory Visit | Attending: Vascular Surgery | Admitting: Vascular Surgery

## 2023-11-23 DIAGNOSIS — M79605 Pain in left leg: Secondary | ICD-10-CM | POA: Diagnosis not present

## 2023-11-23 DIAGNOSIS — M79604 Pain in right leg: Secondary | ICD-10-CM

## 2023-11-23 NOTE — Telephone Encounter (Signed)
 Attempted to contact the patient to schedule VAS [place]. No answer. Left message. First Attempt Provided direct contact number for scheduling: [phone].

## 2023-11-26 ENCOUNTER — Encounter: Admitting: Psychology

## 2023-11-26 ENCOUNTER — Encounter: Payer: Self-pay | Admitting: Psychology

## 2023-11-26 DIAGNOSIS — M25551 Pain in right hip: Secondary | ICD-10-CM | POA: Diagnosis not present

## 2023-11-27 DIAGNOSIS — R600 Localized edema: Secondary | ICD-10-CM | POA: Diagnosis not present

## 2023-11-27 DIAGNOSIS — E8770 Fluid overload, unspecified: Secondary | ICD-10-CM | POA: Diagnosis not present

## 2023-12-10 ENCOUNTER — Ambulatory Visit (HOSPITAL_BASED_OUTPATIENT_CLINIC_OR_DEPARTMENT_OTHER)

## 2023-12-10 DIAGNOSIS — I5189 Other ill-defined heart diseases: Secondary | ICD-10-CM | POA: Diagnosis not present

## 2023-12-10 DIAGNOSIS — I270 Primary pulmonary hypertension: Secondary | ICD-10-CM | POA: Diagnosis not present

## 2023-12-10 DIAGNOSIS — R601 Generalized edema: Secondary | ICD-10-CM | POA: Diagnosis not present

## 2023-12-10 DIAGNOSIS — R600 Localized edema: Secondary | ICD-10-CM | POA: Diagnosis not present

## 2023-12-10 DIAGNOSIS — E8770 Fluid overload, unspecified: Secondary | ICD-10-CM

## 2023-12-10 LAB — ECHOCARDIOGRAM COMPLETE
Area-P 1/2: 3.77 cm2
S' Lateral: 2.32 cm

## 2023-12-12 ENCOUNTER — Encounter: Payer: Self-pay | Admitting: Physician Assistant

## 2023-12-12 ENCOUNTER — Ambulatory Visit (INDEPENDENT_AMBULATORY_CARE_PROVIDER_SITE_OTHER): Admitting: Physician Assistant

## 2023-12-12 VITALS — BP 126/74 | HR 87 | Ht 66.0 in | Wt 196.4 lb

## 2023-12-12 DIAGNOSIS — G3184 Mild cognitive impairment, so stated: Secondary | ICD-10-CM

## 2023-12-12 DIAGNOSIS — R251 Tremor, unspecified: Secondary | ICD-10-CM

## 2023-12-12 NOTE — Progress Notes (Signed)
 Assessment/Plan:    Mild cognitive impairment, likely multifactorial etiology  Barbara Patton is a very pleasant 83 y.o. RH female with a history of hypertension, hyperlipidemia, tremors, fibromyalgia, vitamin D  deficiency, SLP, chronic uveitis of the right eye, osteoarthritis and a diagnosis of mild cognitive impairment, likely multifactorial in the setting of chronic vascular disease with cerebrovascular pathology, reduced hearing.  After neuropsych evaluation, her profile does not strongly suggest Alzheimer's disease at this time.  She is presenting today in follow-up for evaluation of memory loss. Patient is on donepezil  10 mg daily, tolerating well.  Her tremors are well-controlled with primidone  50 mg twice daily.  She is able to participate of her ADLs and to drive without difficulty.  Her mood is good..     Recommendations:   Follow up in 6 months. Continue donepezil  10 mg daily, side effects discussed Repeat neuropsych evaluation in 12 to 18 months Monitor hearing with audiologist in an effort to improve comprehension Recommend good control of cardiovascular risk factors Continue to control mood as per PCP  Hand pain and paresthesias EMG is negative MRI of the cervical spine unremarkable Follow-up with pain management with [facility]  Tremor Controlled with primidone  50 mg twice daily On Lyrica for hand pain Follow-up with Dr. Evonnie scheduled on November 2025   Subjective:   This patient is here alone. Previous records as well as any outside records available were reviewed prior to todays visit.   Patient was last seen on 09/03/2023 [facility] 19/30.    Any changes in memory since last visit? About the same.  As before, she reports that both short-term and long-term memory are affected.  She has difficulty remembering new information, recent conversations and names including those of people that she knew. repeats oneself?  Endorsed Disoriented when walking into a room?  Patient  denies   Misplacing objects?  Patient denies   Wandering behavior?   Denies. Any personality changes since last visit? Denies.   Any worsening depression?: denies.   Hallucinations or paranoia? Denies  Seizures?   Denies.    Any sleep changes? Sleeps well unless the pain wakes me . I sleep about 5 hrs  She does have vivid dreams, but mostly about family denies REM behavior or sleepwalking   Sleep apnea?   denies    Any hygiene concerns?   Denies.   Independent of bathing and dressing?  Endorsed  Does the patient needs help with medications? Patient is in charge   Who is in charge of the finances?  Patient is in charge     Any changes in appetite?  denies     Patient have trouble swallowing?  Denies.   Does the patient cook?  Yes, denies forgetting any common recipes. Any kitchen accidents such as leaving the stove on?   Denies.   Any headaches?    Denies.   Vision changes? Denies.  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  Endorsed, the patient has a history of fibromyalgia, primary osteoarthritis, followed by orthopedics. Ambulates with difficulty?   She uses a cane for stability   Recent falls or head injuries?    Denies.      Unilateral weakness, numbness or tingling?  She has a history of chronic hand pain with paresthesias.  Sometimes my hand hurts when I have to grab something  Any tremors?  Chronic left hand, none recently-she says.  She takes primidone  50 mg  twice daily with good response Any anosmia?    Denies.   Any incontinence of urine?  Endorsed, residual. Any bowel dysfunction?  Denies.      Patient lives  .  Does the patient drive?  Yes, she denies any issues.  She denies getting lost.    Initial visit 09/03/2023   How long did patient have memory difficulties?  For about 1 year.  Patient reports some difficulty remembering new information, recent conversations, names. Both STM and LTM are affected repeats  oneself?  Denies Disoriented when walking into a room?  Patient denies expect for sometimes not remembering what she was in the kitchen for.  Leaving objects in unusual places?  denies   Wandering behavior? denies   Any personality changes, or depression, anxiety? Denies. Hallucinations or paranoia? Denies. I am missing a ring and she claims she did not touch it   Seizures? denies    Any sleep changes?  Sleeps well but not as long as I want to, the pain wakes me up. Reports vivid dreams but mostly about family, REM behavior or sleepwalking   Sleep apnea? Denies.   Any hygiene concerns?  Denies.   Independent of bathing and dressing? Endorsed  Does the patient need help with medications? Patient is in charge   Who is in charge of the finances? Patient is in charge    Any changes in appetite?   Denies. Appetite is good.     Patient have trouble swallowing?  Denies.   Does the patient cook? Yes, denies any kitchen accidents. Any headaches?  Denies.   Chronic pain? Denies.   Ambulates with difficulty? Denies, but needs a cane to ambulate for stability.   Recent falls or head injuries?  She had 2 mechanical falls, one she was bending over and fell. Another time she was getting out of the commode and I didn't hold myself well. No LOC or head injury Vision changes?  Denies any new issues.  Has a history of Lupus in the L eye, I take medicine for the last 2 years.  Any strokelike symptoms? Denies.   Any tremors? Not recently at rest, only when I try to pick up something Any anosmia? Denies.   Any incontinence of urine?Endorsed, has residual. If I go, I have to go back, need diapers Any bowel dysfunction? Denies.      Daughter lives with her History of heavy alcohol intake? One 8 oz wine 5 days a week.   History of heavy tobacco use? Denies.   Family history of dementia? Denies  Does patient drive? 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   Neurocognitive testing June 2025  Results of the current evaluation indicated variability across measures of processing speed, executive functioning, visuospatial functioning, and learning/memory. In the setting of broadly preserved functional independence, findings are consistent with a diagnosis of mild cognitive impairment. 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 At this time, her profile does not strongly suggest Alzheimer's disease, though this cannot be definitively ruled out. Serial assessment will be important for clarifying the underlying etiology, monitoring progression, and adjusting the treatment plan as needed.   Past Medical History:  Diagnosis Date   Glaucoma    High cholesterol    Hypertension  Lupus    Thyroid  disease      Past Surgical History:  Procedure Laterality Date   ABDOMINAL HYSTERECTOMY     CATARACT EXTRACTION, BILATERAL     TOTAL KNEE ARTHROPLASTY Bilateral    TUBAL LIGATION       PREVIOUS MEDICATIONS:   CURRENT MEDICATIONS:  Outpatient Encounter Medications as of 12/12/2023  Medication Sig   acetaminophen  (TYLENOL ) 325 MG tablet Take 650 mg by mouth 2 (two) times daily as needed (pain).   Cholecalciferol (VITAMIN D -3 PO) Take 1 tablet by mouth daily.   diclofenac  Sodium (VOLTAREN ) 1 % GEL    DULoxetine (CYMBALTA) 30 MG capsule    furosemide (LASIX) 40 MG tablet Take 40 mg by mouth daily.   hydroxychloroquine (PLAQUENIL) 200 MG tablet Take 200 mg by mouth daily.    hydrOXYzine (ATARAX/VISTARIL) 10 MG tablet Take 10 mg by mouth every 6 (six) hours as needed for itching or anxiety.   levothyroxine (SYNTHROID) 100 MCG tablet Take 100 mcg by mouth daily.   memantine  (NAMENDA ) 10 MG tablet Take 1 tablet (10 mg at night) for 2  weeks, then increase to 1 tablet (10 mg) twice a day   Multiple Vitamins-Minerals (MULTIVITAMIN PO) Take 1 tablet by mouth daily.    omeprazole (PRILOSEC) 40 MG capsule Take 40 mg by mouth daily.   primidone  (MYSOLINE ) 50 MG tablet TAKE 1 TABLET TWICE DAILY   simvastatin (ZOCOR) 20 MG tablet Take 20 mg by mouth every evening.    Ergocalciferol  62.5 MCG (2500 UT) CAPS 1 po daily x 3 weeks and then weekly x 16 weeks   triamcinolone  cream (KENALOG ) 0.1 % Apply 1 Application topically 2 (two) times daily.   [DISCONTINUED] estradiol (ESTRACE) 0.5 MG tablet    No facility-administered encounter medications on file as of 12/12/2023.     Objective:     PHYSICAL EXAMINATION:    VITALS:   Vitals:   12/12/23 1057  BP: 126/74  Pulse: 87  SpO2: 95%  Weight: 196 lb 6.4 oz (89.1 kg)  Height: 5' 6 (1.676 m)    GEN:  The patient appears stated age and is in NAD. HEENT:  Normocephalic, atraumatic.   Neurological examination:  General: NAD, well-groomed, appears stated age. Orientation: The patient is alert. Oriented to person, place and not to date.  Cranial nerves: There is good facial symmetry.The speech is fluent and clear. No aphasia or dysarthria. Fund of knowledge is appropriate. Recent memory impaired and remote memory is normal.  Attention and concentration are reduced.  Able to name objects and repeat phrases.  Hearing is somewhat decreased to conversational tone  Sensation: Sensation is intact to light touch throughout. Motor: Strength is at least antigravity x4. DTR's 2/4 in UE/LE      09/03/2023    1:00 PM  Montreal Cognitive Assessment   Visuospatial/ Executive (0/5) 2  Naming (0/3) 2  Attention: Read list of digits (0/2) 2  Attention: Read list of letters (0/1) 1  Attention: Serial 7 subtraction starting at 100 (0/3) 2  Language: Repeat phrase (0/2) 1  Language : Fluency (0/1) 0  Abstraction (0/2) 0  Delayed Recall (0/5) 2  Orientation (0/6) 6  Total 18  Adjusted  Score (based on education) 19        No data to display             Movement examination: Tone: There is normal tone in the UE/LE, no cogwheeling Abnormal movements: No tremors today.  No myoclonus.  No asterixis.   Coordination:  There is no decremation with RAM's. Normal finger to nose  Gait and Station: The patient has no difficulty arising out of a deep-seated chair without the use of the hands. Less R arm swing.  The patient's stride length is shorter than prior.  Gait is cautious and broad-based.   Thank you for allowing us  the opportunity to participate in the care of this nice patient. Please do not hesitate to contact us  for any questions or concerns.   Total time spent on today's visit was 24 minutes dedicated to this patient today, preparing to see patient, examining the patient, ordering tests and/or medications and counseling the patient, documenting clinical information in the EHR or other health record, independently interpreting results and communicating results to the patient/family, discussing treatment and goals, answering patient's questions and coordinating care.  Cc:  Rexanne Ingle, MD  Camie Sevin 12/12/2023 12:28 PM

## 2023-12-12 NOTE — Patient Instructions (Addendum)
 It was a pleasure to see you today at our office.   Recommendations   Continue Donepezil  10 mg  Side effects discussed     Follow up in 6 months Follow up with Dr. Evonnie for tremors as scheduled.   [email or web address]   RECOMMENDATIONS FOR ALL PATIENTS WITH MEMORY PROBLEMS: 1. Continue to exercise (Recommend 30 minutes of walking everyday, or 3 hours every week) 2. Increase social interactions - continue going to [place] and enjoy social gatherings with friends and family 3. Eat healthy, avoid fried foods and eat more fruits and vegetables 4. Maintain adequate blood pressure, blood sugar, and blood cholesterol level. Reducing the risk of stroke and cardiovascular disease also helps promoting better memory. 5. Avoid stressful situations. Live a simple life and avoid aggravations. Organize your time and prepare for the next day in anticipation. 6. Sleep well, avoid any interruptions of sleep and avoid any distractions in the bedroom that may interfere with adequate sleep quality 7. Avoid sugar, avoid sweets as there is a strong link between excessive sugar intake, diabetes, and cognitive impairment We discussed the Mediterranean diet, which has been shown to help patients reduce the risk of progressive memory disorders and reduces cardiovascular risk. This includes eating fish, eat fruits and green leafy vegetables, nuts like almonds and hazelnuts, walnuts, and also use olive oil. Avoid fast foods and fried foods as much as possible. Avoid sweets and sugar as sugar use has been linked to worsening of memory function.  There is always a concern of gradual progression of memory problems. If this is the case, then we may need to adjust level of care according to patient needs. Support, both to the patient and caregiver, should then be put into place.       DRIVING: Regarding driving, in patients with progressive memory problems, driving will be  impaired. We advise to have someone else do the driving if trouble finding directions or if minor accidents are reported. Independent driving assessment is available to determine safety of driving.   If you are interested in the driving assessment, you can contact the following:  [facility] [employer] [facility] in [place] [phone]  [facility] [phone]  [facility] [phone]  [facility] [phone] or [phone]   FALL PRECAUTIONS: Be cautious when walking. Scan the area for obstacles that may increase the risk of trips and falls. When getting up in the mornings, sit up at the edge of the bed for a few minutes before getting out of bed. Consider elevating the bed at the head end to avoid drop of blood pressure when getting up. Walk always in a well-lit room (use night lights in the walls). Avoid area rugs or power cords from appliances in the middle of the walkways. Use a walker or a cane if necessary and consider physical therapy for balance exercise. Get your eyesight checked regularly.  FINANCIAL OVERSIGHT: Supervision, especially oversight when making financial decisions or transactions is also recommended.  HOME SAFETY: Consider the safety of the kitchen when operating appliances like stoves, microwave oven, and blender. Consider having supervision and share cooking responsibilities until no longer able to participate in those. Accidents with firearms and other hazards in the house should be identified and addressed as well.   ABILITY TO BE LEFT ALONE: If patient is unable to contact 911 operator, consider using LifeLine, or when the need is there, arrange for someone to stay with patients. Smoking is a fire hazard, consider supervision or cessation. Risk of wandering  should be assessed by caregiver and if detected at any point, supervision and safe proof recommendations should be instituted.  MEDICATION SUPERVISION: Inability to  self-administer medication needs to be constantly addressed. Implement a mechanism to ensure safe administration of the medications.      Mediterranean Diet A Mediterranean diet refers to food and lifestyle choices that are based on the traditions of countries located on the [employer] [place]. This way of eating has been shown to help prevent certain conditions and improve outcomes for people who have chronic diseases, like kidney disease and heart disease. What are tips for following this plan? Lifestyle  Cook and eat meals together with your family, when possible. Drink enough fluid to keep your urine clear or pale yellow. Be physically active every day. This includes: Aerobic exercise like running or swimming. Leisure activities like gardening, walking, or housework. Get 7-8 hours of sleep each night. If recommended by your health care provider, drink red wine in moderation. This means 1 glass a day for nonpregnant women and 2 glasses a day for men. A glass of wine equals 5 oz (150 mL). Reading food labels  Check the serving size of packaged foods. For foods such as rice and pasta, the serving size refers to the amount of cooked product, not dry. Check the total fat in packaged foods. Avoid foods that have saturated fat or trans fats. Check the ingredients list for added sugars, such as corn syrup. Shopping  At the grocery store, buy most of your food from the areas near the walls of the store. This includes: Fresh fruits and vegetables (produce). Grains, beans, nuts, and seeds. Some of these may be available in unpackaged forms or large amounts (in bulk). Fresh seafood. Poultry and eggs. Low-fat dairy products. Buy whole ingredients instead of prepackaged foods. Buy fresh fruits and vegetables in-season from local farmers markets. Buy frozen fruits and vegetables in resealable bags. If you do not have access to quality fresh seafood, buy precooked frozen shrimp or canned fish, such  as tuna, salmon, or sardines. Buy small amounts of raw or cooked vegetables, salads, or olives from the deli or salad bar at your store. Stock your pantry so you always have certain foods on hand, such as olive oil, canned tuna, canned tomatoes, rice, pasta, and beans. Cooking  Cook foods with extra-virgin olive oil instead of using butter or other vegetable oils. Have meat as a side dish, and have vegetables or grains as your main dish. This means having meat in small portions or adding small amounts of meat to foods like pasta or stew. Use beans or vegetables instead of meat in common dishes like chili or lasagna. Experiment with different cooking methods. Try roasting or broiling vegetables instead of steaming or sauteing them. Add frozen vegetables to soups, stews, pasta, or rice. Add nuts or seeds for added healthy fat at each meal. You can add these to yogurt, salads, or vegetable dishes. Marinate fish or vegetables using olive oil, lemon juice, garlic, and fresh herbs. Meal planning  Plan to eat 1 vegetarian meal one day each week. Try to work up to 2 vegetarian meals, if possible. Eat seafood 2 or more times a week. Have healthy snacks readily available, such as: Vegetable sticks with hummus. Greek yogurt. Fruit and nut trail mix. Eat balanced meals throughout the week. This includes: Fruit: 2-3 servings a day Vegetables: 4-5 servings a day Low-fat dairy: 2 servings a day Fish, poultry, or lean meat: 1 serving a day  Beans and legumes: 2 or more servings a week Nuts and seeds: 1-2 servings a day Whole grains: 6-8 servings a day Extra-virgin olive oil: 3-4 servings a day Limit red meat and sweets to only a few servings a month What are my food choices? Mediterranean diet Recommended Grains: Whole-grain pasta. Brown rice. Bulgar wheat. Polenta. Couscous. Whole-wheat bread. [place] Made[place]. Vegetables: Artichokes. Beets. Broccoli. Cabbage. Carrots. Eggplant. Green beans. Chard.  Kale. Spinach. Onions. Leeks. Peas. Squash. Tomatoes. Peppers. Radishes. Fruits: Apples. Apricots. Avocado. Berries. Bananas. Cherries. Dates. Figs. Grapes. Lemons. Melon. Oranges. Peaches. Plums. Pomegranate. Meats and other protein foods: Beans. Almonds. Sunflower seeds. Pine nuts. Peanuts. Cod. Salmon. Scallops. Shrimp. Tuna. Tilapia. Clams. Oysters. Eggs. Dairy: Low-fat milk. Cheese. Greek yogurt. Beverages: Water. Red wine. Herbal tea. Fats and oils: Extra virgin olive oil. Avocado oil. Grape seed oil. Sweets and desserts: [place] yogurt with honey. Baked apples. Poached pears. Trail mix. Seasoning and other foods: Basil. Cilantro. Coriander. Cumin. Mint. Parsley. Sage. Rosemary. Tarragon. Garlic. Oregano. Thyme. Pepper. Balsalmic vinegar. Tahini. Hummus. Tomato sauce. Olives. Mushrooms. Limit these Grains: Prepackaged pasta or rice dishes. Prepackaged cereal with added sugar. Vegetables: Deep fried potatoes (french fries). Fruits: Fruit canned in syrup. Meats and other protein foods: Beef. Pork. Lamb. Poultry with skin. Hot dogs. [place]. Dairy: Ice cream. Sour cream. Whole milk. Beverages: Juice. Sugar-sweetened soft drinks. Beer. Liquor and spirits. Fats and oils: Butter. Canola oil. Vegetable oil. Beef fat (tallow). Lard. Sweets and desserts: Cookies. Cakes. Pies. Candy. Seasoning and other foods: Mayonnaise. Premade sauces and marinades. The items listed may not be a complete list. Talk with your dietitian about what dietary choices are right for you. Summary The Mediterranean diet includes both food and lifestyle choices. Eat a variety of fresh fruits and vegetables, beans, nuts, seeds, and whole grains. Limit the amount of red meat and sweets that you eat. Talk with your health care provider about whether it is safe for you to drink red wine in moderation. This means 1 glass a day for nonpregnant women and 2 glasses a day for men. A glass of wine equals 5 oz (150 mL). This information  is not intended to replace advice given to you by your health care provider. Make sure you discuss any questions you have with your health care provider. Document Released: 01/13/2016 Document Revised: 02/15/2016 Document Reviewed: 01/13/2016 Elsevier Interactive Patient Education  2017 [employer].

## 2023-12-19 DIAGNOSIS — G3184 Mild cognitive impairment, so stated: Secondary | ICD-10-CM | POA: Diagnosis not present

## 2023-12-19 DIAGNOSIS — Z961 Presence of intraocular lens: Secondary | ICD-10-CM | POA: Diagnosis not present

## 2023-12-19 DIAGNOSIS — Z1331 Encounter for screening for depression: Secondary | ICD-10-CM | POA: Diagnosis not present

## 2023-12-19 DIAGNOSIS — I10 Essential (primary) hypertension: Secondary | ICD-10-CM | POA: Diagnosis not present

## 2023-12-19 DIAGNOSIS — M329 Systemic lupus erythematosus, unspecified: Secondary | ICD-10-CM | POA: Diagnosis not present

## 2023-12-19 DIAGNOSIS — H35371 Puckering of macula, right eye: Secondary | ICD-10-CM | POA: Diagnosis not present

## 2023-12-19 DIAGNOSIS — M797 Fibromyalgia: Secondary | ICD-10-CM | POA: Diagnosis not present

## 2023-12-19 DIAGNOSIS — H401133 Primary open-angle glaucoma, bilateral, severe stage: Secondary | ICD-10-CM | POA: Diagnosis not present

## 2023-12-19 DIAGNOSIS — E039 Hypothyroidism, unspecified: Secondary | ICD-10-CM | POA: Diagnosis not present

## 2023-12-19 DIAGNOSIS — I5189 Other ill-defined heart diseases: Secondary | ICD-10-CM | POA: Diagnosis not present

## 2023-12-19 DIAGNOSIS — Z0000 Encounter for general adult medical examination without abnormal findings: Secondary | ICD-10-CM | POA: Diagnosis not present

## 2023-12-19 DIAGNOSIS — R600 Localized edema: Secondary | ICD-10-CM | POA: Diagnosis not present

## 2023-12-19 DIAGNOSIS — G8929 Other chronic pain: Secondary | ICD-10-CM | POA: Diagnosis not present

## 2023-12-19 DIAGNOSIS — I270 Primary pulmonary hypertension: Secondary | ICD-10-CM | POA: Diagnosis not present

## 2023-12-26 ENCOUNTER — Telehealth: Payer: Self-pay | Admitting: Physician Assistant

## 2023-12-26 ENCOUNTER — Other Ambulatory Visit: Payer: Self-pay | Admitting: Physician Assistant

## 2023-12-26 MED ORDER — MEMANTINE HCL 10 MG PO TABS: ORAL_TABLET | ORAL | 3 refills | Status: AC

## 2023-12-26 NOTE — Telephone Encounter (Signed)
 Pt wants a refill on her memantine  10mg  Sent to [facility] so it will be free. Any questions call her. She is running low.

## 2023-12-31 ENCOUNTER — Ambulatory Visit (INDEPENDENT_AMBULATORY_CARE_PROVIDER_SITE_OTHER): Admitting: Psychology

## 2023-12-31 DIAGNOSIS — G3184 Mild cognitive impairment, so stated: Secondary | ICD-10-CM | POA: Diagnosis not present

## 2023-12-31 NOTE — Progress Notes (Signed)
   NEUROPSYCHOLOGY FEEDBACK SESSION [name]  [place] Department of Neurology  Date of Feedback Session: 12/31/2023  REASON FOR REFERRAL   Barbara Patton is an 83 year old, right-handed, Black female with 10 years of formal education. She was referred for neuropsychological evaluation by Camie Sevin, PA-C, to assess current neurocognitive functioning, document potential cognitive deficits, and assist with treatment planning. This is her first neuropsychological evaluation.  FEEDBACK   Patient completed a comprehensive neuropsychological evaluation on 11/19/2023. Please refer to that encounter for the full report and recommendations. Briefly, results indicated variability across measures of processing speed, executive functioning, visuospatial functioning, and learning/memory. In the setting of broadly preserved functional independence, findings are consistent with a diagnosis of mild cognitive impairment. 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 At this time, her profile does not strongly suggest Alzheimer's disease, though this cannot be definitively ruled out.  Today, the patient was unaccompanied. She was provided verbal feedback regarding the findings and impression during this visit, and her questions were answered. A copy of the report was provided at the conclusion of the visit.  DISPOSITION   Patient will follow up with the referring provider, Ms. Wertman. She should return for repeat neuropsychological testing in 12-18 months to monitor her course and assist with diagnosis and treatment planning.  SERVICE   This feedback session was conducted by Renda Beckwith, Psy.D. [place] of 03867 (35 minutes) was billed for Dr. Beckwith' time spent in preparing, conducting, and  documenting the current feedback session.  This report was generated using voice recognition software. While this document has been carefully reviewed, transcription errors may be present. I apologize in advance for any inconvenience. Please contact me if further clarification is needed.

## 2024-01-02 ENCOUNTER — Ambulatory Visit: Admitting: Physician Assistant

## 2024-01-09 ENCOUNTER — Ambulatory Visit (INDEPENDENT_AMBULATORY_CARE_PROVIDER_SITE_OTHER): Admitting: Podiatry

## 2024-01-09 DIAGNOSIS — Z91199 Patient's noncompliance with other medical treatment and regimen due to unspecified reason: Secondary | ICD-10-CM

## 2024-01-09 NOTE — Progress Notes (Signed)
 Cancel 24 hours

## 2024-01-14 ENCOUNTER — Ambulatory Visit: Admitting: Surgical

## 2024-01-21 ENCOUNTER — Other Ambulatory Visit: Payer: Self-pay

## 2024-01-21 ENCOUNTER — Ambulatory Visit (INDEPENDENT_AMBULATORY_CARE_PROVIDER_SITE_OTHER): Admitting: Surgical

## 2024-01-21 DIAGNOSIS — M19031 Primary osteoarthritis, right wrist: Secondary | ICD-10-CM | POA: Diagnosis not present

## 2024-01-21 DIAGNOSIS — M19032 Primary osteoarthritis, left wrist: Secondary | ICD-10-CM | POA: Diagnosis not present

## 2024-01-21 DIAGNOSIS — M7061 Trochanteric bursitis, right hip: Secondary | ICD-10-CM | POA: Diagnosis not present

## 2024-01-21 DIAGNOSIS — M79641 Pain in right hand: Secondary | ICD-10-CM

## 2024-01-21 DIAGNOSIS — M79642 Pain in left hand: Secondary | ICD-10-CM

## 2024-01-21 MED ORDER — BUPIVACAINE HCL 0.25 % IJ SOLN
0.5000 mL | INTRAMUSCULAR | Status: AC | PRN
  Administered 2024-01-21: .5 mL via INTRA_ARTICULAR

## 2024-01-21 MED ORDER — TRIAMCINOLONE ACETONIDE 40 MG/ML IJ SUSP
20.0000 mg | INTRAMUSCULAR | Status: AC | PRN
  Administered 2024-01-21: 20 mg via INTRA_ARTICULAR

## 2024-01-21 MED ORDER — LIDOCAINE HCL 1 % IJ SOLN
5.0000 mL | INTRAMUSCULAR | Status: AC | PRN
  Administered 2024-01-21: 5 mL

## 2024-01-21 MED ORDER — BUPIVACAINE HCL 0.25 % IJ SOLN
4.0000 mL | INTRAMUSCULAR | Status: AC | PRN
  Administered 2024-01-21: 4 mL via INTRA_ARTICULAR

## 2024-01-21 MED ORDER — METHYLPREDNISOLONE ACETATE 40 MG/ML IJ SUSP
40.0000 mg | INTRAMUSCULAR | Status: AC | PRN
  Administered 2024-01-21: 40 mg via INTRA_ARTICULAR

## 2024-01-21 NOTE — Progress Notes (Signed)
 Follow-up Office Visit Note   Patient: Barbara Patton           Date of Birth: [birth year]           MRN: [id number] Visit Date: 01/21/2024 Requested by: Rexanne Ingle, MD [address] [place],  [place] [address] PCP: Rexanne Ingle, MD  Subjective: Chief Complaint  Patient presents with   Right Hip - Pain   Left Wrist - Pain   Right Wrist - Pain    HPI: Barbara Patton is a 83 y.o. female who returns to the office for follow-up visit.    Plan at last visit was: Impression is 83 year old female with right hip trochanteric bursitis. She has had similar issues in the past and would like to repeat trochanteric injection. I think this would be likely more helpful for her than intra-articular injection like she had on her left side due to the difference in degenerative changes that she has between the left and right hips. Tolerated injection well under ultrasound guidance. Care taken to avoid injection into the gluteal tendon structures. We will see her back as needed but recommended that patient reach out in about 2 weeks to let us  know how this injection did. If no lasting improvement, could consider intra-articular injection.   Since then, patient notes she cannot recall if the prior injection gave her good relief but her lateral right hip has returned to bothering her now.  She has occasional left groin pain as well.  Both of her knees cause pain as well.  Both knees have been replaced.  No fevers or chills.  She also has wrist pain that she localizes diffusely throughout both wrists as well as primarily on the dorsal aspect of the wrist.  She has associated numbness and tingling of the left hand in the median nerve distribution but this is not concerning for her as the severe wrist pain she has.  She enjoys bowling but due to the multiple joint pains she has, this is the first season that she will not return to bowling since 1971.              ROS: All systems reviewed are  negative as they relate to the chief complaint within the history of present illness.  Patient denies fevers or chills.  Assessment & Plan: Visit Diagnoses:  1. Greater trochanteric bursitis, right   2. Bilateral hand pain     Plan: Barbara Patton is a 83 y.o. female who returns to the office for follow-up visit.  Plan from last visit was noted above in HPI.  They now return with right hip pain localizing to the greater trochanter.  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  After discussion of options, patient would like to try right greater trochanteric injection.  This was administered under ultrasound guidance as well as bilateral radiocarpal injections.  Patient tolerated procedures well without complication.  If no improvement after radiocarpal injections, could consider CMC injection as she has arthritis in this area as well.  Follow-up as needed.  Follow-Up Instructions: No follow-ups on file.   Orders:  Orders Placed This Encounter  Procedures   US  Guided Needle Placement - No Linked Charges   No orders of the defined types were placed in this encounter.     Procedures: Large Joint Inj: R greater trochanter on 01/21/2024 2:03 PM Indications: pain and diagnostic evaluation Details: 22 G  3.5 in needle, ultrasound-guided lateral approach  Arthrogram: No  Medications: 5 mL lidocaine  1 %; 4 mL bupivacaine  0.25 %; 40 mg methylPREDNISolone  acetate 40 MG/ML Outcome: tolerated well, no immediate complications Procedure, treatment alternatives, risks and benefits explained, specific risks discussed. Consent was given by the patient. Immediately prior to procedure a time out was called to verify the correct patient, procedure, equipment, support staff and site/side marked as required. Patient was prepped and draped in the usual sterile fashion.    Medium Joint Inj: bilateral radiocarpal on 01/21/2024 2:03  PM Indications: pain and diagnostic evaluation Details: 22 G 1.5 in needle, ultrasound-guided dorsal approach Medications (Right): 0.5 mL bupivacaine  0.25 %; 20 mg triamcinolone  acetonide 40 MG/ML Medications (Left): 0.5 mL bupivacaine  0.25 %; 20 mg triamcinolone  acetonide 40 MG/ML Outcome: tolerated well, no immediate complications Procedure, treatment alternatives, risks and benefits explained, specific risks discussed. Consent was given by the patient. Immediately prior to procedure a time out was called to verify the correct patient, procedure, equipment, support staff and site/side marked as required. Patient was prepped and draped in the usual sterile fashion.       Clinical Data: No additional findings.  Objective: Vital Signs: There were no vitals taken for this visit.  Physical Exam:  Constitutional: Patient appears well-developed HEENT:  Head: Normocephalic Eyes:EOM are normal Neck: Normal range of motion Cardiovascular: Normal rate Pulmonary/chest: Effort normal Neurologic: Patient is alert Skin: Skin is warm Psychiatric: Patient has normal mood and affect  Ortho Exam: Ortho exam demonstrates bilateral wrists without cellulitis or skin changes.  Mild swelling over the right wrist relative to the left.  2+ radial pulse of bilateral upper extremities.  Intact EPL, FPL, finger abduction.  Able to make full composite fist with both hands.  Negative Tinel sign bilaterally.  No triggering of any of the digits noted.  She has excellent grip strength bilaterally.  No significant atrophy of thenar or hyperthenar areas bilaterally.  Right hip with no pain with hip range of motion.  She has tenderness over the right greater trochanter consistent with prior exam.  Both knees have well-healed incision over the anterior aspect of the knee without sinus tract.  No effusion in either knee.  Specialty Comments:  EXAM: MRI LUMBAR SPINE WITHOUT CONTRAST   TECHNIQUE: Multiplanar,  multisequence MR imaging of the lumbar spine was performed. No intravenous contrast was administered.   COMPARISON:  Radiographs 10/01/2022 and 07/31/2022.  MRI 12/04/2019.   FINDINGS: Segmentation: Conventional anatomy assumed, with the last open disc space designated L5-S1.Concordant with prior imaging.   Alignment: Grade 1 anterolisthesis at L3-4 and L4-5, similar to previous MRI.   Vertebrae: There are mild superior endplate compression fractures at the L3 and L4 levels, resulting in approximately 10% loss of vertebral body height. There is associated bone marrow edema, but no osseous retropulsion or definite involvement of the posterior elements. These fractures are not clearly seen on recent or prior radiographs. No other acute osseous findings. The visualized sacroiliac joints appear unremarkable.   Conus medullaris: Extends to the L1-2 level and appears normal.   Paraspinal and other soft tissues: No acute paraspinal findings. The urinary bladder is noted to be distended and moderately trabeculated. 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   Disc levels:   Sagittal images demonstrate no significant disc space findings within the visualized lower thoracic spine. There is mild disc bulging at T11-12 and T12-L1.   L1-2: Preserved disc height. Mild facet hypertrophy. No spinal stenosis or nerve root  encroachment.   L2-3: Preserved disc height with mild disc bulging and mild-to-moderate facet hypertrophy. No spinal stenosis or nerve root encroachment.   L3-4: Moderate to severe facet hypertrophy accounts for the grade 1 anterolisthesis and contributes to annular disc bulging and uncovering. There is resulting moderate multifactorial spinal stenosis with mild lateral recess and minimal foraminal narrowing bilaterally. The spinal stenosis has progressed from the previous MRI   L4-5: Moderate to severe facet hypertrophy accounts for the grade 1 anterolisthesis  and contributes to annular disc bulging and uncovering. There is only resulting borderline spinal stenosis at this level with minimal left lateral recess and left foraminal narrowing. No nerve root encroachment. No significant changes are seen at this level.   L5-S1: Chronic disc bulging with endplate osteophytes asymmetric to the left. Mild facet hypertrophy. No spinal stenosis or nerve root encroachment.   IMPRESSION: 1. Acute/subacute mild superior endplate compression fractures at L3 and L4 with associated bone marrow edema, but no osseous retropulsion. 2. 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 3. Borderline spinal stenosis at L4-5 secondary to facet hypertrophy and a grade 1 anterolisthesis. 4. Distended urinary bladder with trabeculation, suggesting chronic bladder outlet obstruction.     Electronically Signed   By: Elsie Perone M.D.   On: 10/26/2022 10:57  Imaging: No results found.   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   Allergic rhinitis 07/26/2015   Anxiety 07/26/2015   Benign essential hypertension 07/26/2015   Dysfunction of left eustachian tube 07/26/2015   Esophageal reflux 07/26/2015   Fibromyalgia 07/26/2015   Pure hypercholesterolemia 07/26/2015   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 05/03/2014   Pes anserinus bursitis of left  knee 12/04/2013   Trochanteric bursitis of left hip 08/07/2013   Leg swelling 12/06/2011   Glaucoma 11/02/2011   HLD (hyperlipidemia) 11/02/2011   HTN (hypertension) 11/02/2011   Hypothyroid 11/02/2011   OA (osteoarthritis) 11/02/2011   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) 03/16/2011   Presence of artificial knee joint 03/16/2011   Past Medical History:  Diagnosis Date   Glaucoma    High cholesterol    Hypertension    Lupus    Thyroid  disease     Family History  Problem Relation Age of Onset   Cancer Father    Cancer Brother    Cancer Brother    Stroke Brother     Past Surgical History:  Procedure Laterality Date   ABDOMINAL HYSTERECTOMY     CATARACT EXTRACTION, BILATERAL     TOTAL KNEE ARTHROPLASTY Bilateral    TUBAL LIGATION     Social History   Occupational History   Not on file  Tobacco Use   Smoking status: Former    Current packs/day: 0.00    Types: Cigarettes    Quit date: 1997    Years since quitting: 28.6   Smokeless tobacco: Never  Vaping Use   Vaping status: Never Used  Substance and Sexual Activity   Alcohol use: Yes    Alcohol/week: 3.0 standard drinks of alcohol    Types: 3 Glasses of wine per week    Comment: occ wine   Drug use: No   Sexual activity: Not on file

## 2024-01-23 ENCOUNTER — Ambulatory Visit (INDEPENDENT_AMBULATORY_CARE_PROVIDER_SITE_OTHER): Admitting: Podiatry

## 2024-01-23 ENCOUNTER — Encounter: Payer: Self-pay | Admitting: Podiatry

## 2024-01-23 DIAGNOSIS — G629 Polyneuropathy, unspecified: Secondary | ICD-10-CM

## 2024-01-23 DIAGNOSIS — R600 Localized edema: Secondary | ICD-10-CM

## 2024-01-23 NOTE — Progress Notes (Signed)
  Subjective:  Patient ID: Barbara Patton, female    DOB: August 24, 1940,   MRN: [id number]  Chief Complaint  Patient presents with   Numbness    My feet are numb under the bottom.  I can hardly flex my toes up.  On my left foot, I can hardly open my toes to dry them.   Foot Swelling    My ankles keep swelling.    83 y.o. female presents for new concern of numbness on the bottom of her feet and swelling in her ankles.  She wanted to make sure everything was ok. She is not diabetic but does have a history of fibromyalgia and lupus.  . Denies any other pedal complaints. Denies n/v/f/c.   Past Medical History:  Diagnosis Date   Glaucoma    High cholesterol    Hypertension    Lupus    Thyroid  disease     Objective:  Physical Exam: Vascular: DP/PT pulses 2/4 bilateral. CFT <3 seconds. Normal hair growth on digits. 1+ pitting edema bilateral lower extremities  Skin. No lacerations or abrasions bilateral feet.  Musculoskeletal: MMT 5/5 bilateral lower extremities in DF, PF, Inversion and Eversion. Deceased ROM in DF of ankle joint. 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  Neurological: Sensation intact to light touch.   Assessment:   1. Neuropathy   2. Localized edema      Plan:  Patient was evaluated and treated and all questions answered. Discussed neuropathy and etiology as well as treatment with patient.  -Discussed and educated patient on foot care, especially with  regards to the vascular, neurological and musculoskeletal systems.  -Discussed supportive shoes at all times and checking feet regularly.  -Discussed edema and elevated and compression to help with this.  -Discussed not much we can do for the numbness  -Patient to return as needed   Asberry Failing, DPM

## 2024-01-30 ENCOUNTER — Ambulatory Visit: Admitting: Orthopedic Surgery

## 2024-02-14 ENCOUNTER — Ambulatory Visit: Admitting: Physician Assistant

## 2024-02-15 ENCOUNTER — Other Ambulatory Visit: Payer: Self-pay

## 2024-02-15 ENCOUNTER — Ambulatory Visit (INDEPENDENT_AMBULATORY_CARE_PROVIDER_SITE_OTHER): Admitting: Surgical

## 2024-02-15 DIAGNOSIS — M1612 Unilateral primary osteoarthritis, left hip: Secondary | ICD-10-CM

## 2024-02-15 DIAGNOSIS — M180 Bilateral primary osteoarthritis of first carpometacarpal joints: Secondary | ICD-10-CM

## 2024-02-17 ENCOUNTER — Encounter: Payer: Self-pay | Admitting: Surgical

## 2024-02-17 MED ORDER — TRIAMCINOLONE ACETONIDE 40 MG/ML IJ SUSP
13.0000 mg | INTRAMUSCULAR | Status: AC | PRN
  Administered 2024-02-15: 13 mg via INTRA_ARTICULAR

## 2024-02-17 MED ORDER — LIDOCAINE HCL 1 % IJ SOLN
3.0000 mL | INTRAMUSCULAR | Status: AC | PRN
  Administered 2024-02-15: 3 mL

## 2024-02-17 MED ORDER — BUPIVACAINE HCL 0.25 % IJ SOLN
0.6600 mL | INTRAMUSCULAR | Status: AC | PRN
  Administered 2024-02-15: .66 mL via INTRA_ARTICULAR

## 2024-02-17 MED ORDER — TRIAMCINOLONE ACETONIDE 40 MG/ML IJ SUSP
40.0000 mg | INTRAMUSCULAR | Status: AC | PRN
  Administered 2024-02-15: 40 mg via INTRA_ARTICULAR

## 2024-02-17 MED ORDER — BUPIVACAINE HCL 0.25 % IJ SOLN
7.0000 mL | INTRAMUSCULAR | Status: AC | PRN
  Administered 2024-02-15: 7 mL via INTRA_ARTICULAR

## 2024-02-17 NOTE — Progress Notes (Signed)
 Follow-up Office Visit Note   Patient: Barbara Patton           Date of Birth: Jul 20, 1940           MRN: [id number] Visit Date: 02/15/2024 Requested by: Rexanne Ingle, MD [address] [name],  [place] [address] PCP: Rexanne Ingle, MD  Subjective: Chief Complaint  Patient presents with   Left Hip - Pain    HPI: Barbara Patton is a 83 y.o. female who returns to the office for follow-up visit.    Plan at last visit was: [name] is a 83 y.o. female who returns to the office for follow-up visit.  Plan from last visit was noted above in HPI.  They now return with right hip pain localizing to the greater trochanter.  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  After discussion of options, patient would like to try right greater trochanteric injection.  This was administered under ultrasound guidance as well as bilateral radiocarpal injections.  Patient tolerated procedures well without complication.  If no improvement after radiocarpal injections, could consider CMC injection as she has arthritis in this area as well.  Follow-up as needed.   Since then, patient notes she really did not have much relief from radiocarpal injections.  Still continues to have similar wrist pain in both wrists.  She also complains of left hip pain localizing to multiple areas primarily in the groin.  Also has some lateral and posterior hip pain.  She has history of low back pain as well.  Ambulating and putting weight on her leg makes her groin pain worse.  Right trochanteric injection at her last visit was very helpful for her right hip pain.              ROS: All systems reviewed are negative as they relate to the chief complaint within the history of present illness.  Patient denies fevers or chills.  Assessment & Plan: Visit Diagnoses:  1. Arthritis of left hip     Plan: Barbara Patton is a 83 y.o. female who  returns to the office for follow-up visit.  Plan from last visit was noted above in HPI.  They now return with left hip pain localizing to the groin.  Has had good relief with both trochanteric and intra-articular hip injections in the past.  Seems more of her pain is related to the hip arthritis based on the groin pain present.  We discussed options and she would like to try intra-articular injection.  This was administered and patient tolerated procedure well without complication under ultrasound guidance.  Regarding her bilateral wrist pain, pain may be referred from the [facility] joints based on the radial sided nature of her pain and the lack of response to radiocarpal injections.  Plan to try bilateral CMC injections today as well.  Administered under ultrasound guidance.  Did have good symptomatic relief during the anesthetic portion of the injection.  Plan to follow-up with the office as needed.  Follow-Up Instructions: No follow-ups on file.   Orders:  Orders Placed This Encounter  Procedures   US  Guided Needle Placement - No Linked Charges   No orders of the defined types were placed in this encounter.     Procedures: Large Joint Inj: L hip joint on 02/15/2024 9:38 AM Indications: pain and diagnostic evaluation Details: 22 G 3.5 in needle, ultrasound-guided anterior approach Medications: 7 mL  bupivacaine  0.25 %; 40 mg triamcinolone  acetonide 40 MG/ML Outcome: tolerated well, no immediate complications Procedure, treatment alternatives, risks and benefits explained, specific risks discussed. Consent was given by the patient. Immediately prior to procedure a time out was called to verify the correct patient, procedure, equipment, support staff and site/side marked as required. Patient was prepped and draped in the usual sterile fashion.    Small Joint Inj: bilateral thumb CMC on 02/15/2024 9:38 AM Indications: pain Details: 25 G needle, ultrasound-guided radial approach  Spinal Needle:  No  Medications (Right): 3 mL lidocaine  1 %; 13 mg triamcinolone  acetonide 40 MG/ML; 0.66 mL bupivacaine  0.25 % Medications (Left): 3 mL lidocaine  1 %; 13 mg triamcinolone  acetonide 40 MG/ML; 0.66 mL bupivacaine  0.25 % Outcome: tolerated well, no immediate complications Procedure, treatment alternatives, risks and benefits explained, specific risks discussed. Consent was given by the patient. Immediately prior to procedure a time out was called to verify the correct patient, procedure, equipment, support staff and site/side marked as required. Patient was prepped and draped in the usual sterile fashion.       Clinical Data: No additional findings.  Objective: Vital Signs: There were no vitals taken for this visit.  Physical Exam:  Constitutional: Patient appears well-developed HEENT:  Head: Normocephalic Eyes:EOM are normal Neck: Normal range of motion Cardiovascular: Normal rate Pulmonary/chest: Effort normal Neurologic: Patient is alert Skin: Skin is warm Psychiatric: Patient has normal mood and affect  Ortho Exam: Ortho exam demonstrates increased pain with positive FADIR sign and Senshio sign reproducing groin pain.  Intact hip flexion, quadricep, hamstring, dorsiflexion, plantarflexion, EHL strength.  She has tenderness over bilateral CMC joints without cellulitis or skin changes.  Positive CMC grind test reproducing pain of the first [facility] joint bilaterally.  2+ radial pulse of bilateral upper extremities.  Specialty Comments:  EXAM: MRI LUMBAR SPINE WITHOUT CONTRAST   TECHNIQUE: Multiplanar, multisequence MR imaging of the lumbar spine was performed. No intravenous contrast was administered.   COMPARISON:  Radiographs 10/01/2022 and 07/31/2022.  MRI 12/04/2019.   FINDINGS: Segmentation: Conventional anatomy assumed, with the last open disc space designated L5-S1.Concordant with prior imaging.   Alignment: Grade 1 anterolisthesis at L3-4 and L4-5, similar to previous  MRI.   Vertebrae: There are mild superior endplate compression fractures at the L3 and L4 levels, resulting in approximately 10% loss of vertebral body height. There is associated bone marrow edema, but no osseous retropulsion or definite involvement of the posterior elements. These fractures are not clearly seen on recent or prior radiographs. No other acute osseous findings. The visualized sacroiliac joints appear unremarkable.   Conus medullaris: Extends to the L1-2 level and appears normal.   Paraspinal and other soft tissues: No acute paraspinal findings. The urinary bladder is noted to be distended and moderately trabeculated. 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   Disc levels:   Sagittal images demonstrate no significant disc space findings within the visualized lower thoracic spine. There is mild disc bulging at T11-12 and T12-L1.   L1-2: Preserved disc height. Mild facet hypertrophy. No spinal stenosis or nerve root encroachment.   L2-3: Preserved disc height with mild disc bulging and mild-to-moderate facet hypertrophy. No spinal stenosis or nerve root encroachment.   L3-4: Moderate to severe facet hypertrophy accounts for the grade 1 anterolisthesis and contributes to annular disc bulging and uncovering. There is resulting moderate multifactorial spinal stenosis with mild lateral recess and minimal foraminal narrowing bilaterally. The spinal stenosis has progressed from the previous MRI  L4-5: Moderate to severe facet hypertrophy accounts for the grade 1 anterolisthesis and contributes to annular disc bulging and uncovering. There is only resulting borderline spinal stenosis at this level with minimal left lateral recess and left foraminal narrowing. No nerve root encroachment. No significant changes are seen at this level.   L5-S1: Chronic disc bulging with endplate osteophytes asymmetric to the left. Mild facet hypertrophy. No spinal stenosis or  nerve root encroachment.   IMPRESSION: 1. Acute/subacute mild superior endplate compression fractures at L3 and L4 with associated bone marrow edema, but no osseous retropulsion. 2. 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 3. Borderline spinal stenosis at L4-5 secondary to facet hypertrophy and a grade 1 anterolisthesis. 4. Distended urinary bladder with trabeculation, suggesting chronic bladder outlet obstruction.     Electronically Signed   By: Elsie Perone M.D.   On: 10/26/2022 10:57  Imaging: No results found.   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   Allergic rhinitis 07/26/2015   Anxiety 07/26/2015   Benign essential hypertension 07/26/2015   Dysfunction of left eustachian tube 07/26/2015   Esophageal reflux 07/26/2015   Fibromyalgia 07/26/2015   Pure hypercholesterolemia 07/26/2015   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 05/03/2014   Pes anserinus bursitis of left knee 12/04/2013   Trochanteric bursitis of left hip 08/07/2013   Leg swelling 12/06/2011   Glaucoma 11/02/2011   HLD (hyperlipidemia) 11/02/2011   HTN (hypertension) 11/02/2011   Hypothyroid 11/02/2011   OA (osteoarthritis) 11/02/2011   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) 03/16/2011   Presence of artificial knee joint 03/16/2011   Past Medical History:  Diagnosis  Date   Glaucoma    High cholesterol    Hypertension    Lupus    Thyroid  disease     Family History  Problem Relation Age of Onset   Cancer Father    Cancer Brother    Cancer Brother    Stroke Brother     Past Surgical History:  Procedure Laterality Date   ABDOMINAL HYSTERECTOMY     CATARACT EXTRACTION, BILATERAL     TOTAL KNEE ARTHROPLASTY Bilateral    TUBAL LIGATION     Social History   Occupational History   Not on file  Tobacco Use   Smoking status: Former    Current packs/day: 0.00    Types: Cigarettes    Quit date: 1997    Years since quitting: 28.7   Smokeless tobacco: Never  Vaping Use   Vaping status: Never Used  Substance and Sexual Activity   Alcohol use: Yes    Alcohol/week: 3.0 standard drinks of alcohol    Types: 3 Glasses of wine per week    Comment: occ wine   Drug use: No   Sexual activity: Not on file

## 2024-02-19 DIAGNOSIS — H5203 Hypermetropia, bilateral: Secondary | ICD-10-CM | POA: Diagnosis not present

## 2024-02-19 DIAGNOSIS — H401132 Primary open-angle glaucoma, bilateral, moderate stage: Secondary | ICD-10-CM | POA: Diagnosis not present

## 2024-02-19 DIAGNOSIS — H3500 Unspecified background retinopathy: Secondary | ICD-10-CM | POA: Diagnosis not present

## 2024-02-29 ENCOUNTER — Other Ambulatory Visit (INDEPENDENT_AMBULATORY_CARE_PROVIDER_SITE_OTHER): Payer: Self-pay

## 2024-02-29 ENCOUNTER — Ambulatory Visit (INDEPENDENT_AMBULATORY_CARE_PROVIDER_SITE_OTHER): Admitting: Surgical

## 2024-02-29 DIAGNOSIS — R0781 Pleurodynia: Secondary | ICD-10-CM | POA: Diagnosis not present

## 2024-03-02 ENCOUNTER — Encounter: Payer: Self-pay | Admitting: Surgical

## 2024-03-02 NOTE — Progress Notes (Signed)
 Office Visit Note   Patient: Barbara Patton           Date of Birth: [birth year]           MRN: [id number] Visit Date: 02/29/2024 Requested by: Rexanne Ingle, MD [address] [place],  [place] [address] PCP: Rexanne Ingle, MD  Subjective: Chief Complaint  Patient presents with   left side pain    HPI: Barbara Patton is a 83 y.o. female who presents to the office reporting left flank pain.  Patient states that she woke with pain about 3 days ago.  She describes pain from the left iliac crest that extends up along the sides of her ribs.  No increase in her typical back pain.  No groin pain.  Denies any new rash.  Tylenol  is not very helpful for controlling the pain.  She denies any history of injury or anything that she did out of the ordinary on the day prior to onset of symptoms.  No change in her bowel movements, nausea, vomiting..                ROS: All systems reviewed are negative as they relate to the chief complaint within the history of present illness.  Patient denies fevers or chills.  Assessment & Plan: Visit Diagnoses:  1. Rib pain on left side     Plan: Impression is 83 year old female who has 3 days of left flank pain without any other new medical complaints.  No change in her bowel movements or any GI symptoms.  No acute abnormality noted on radiographs.  We discussed options available to patient such as checking in with her PCP to see if there is a medical reason she could be having this pain versus observation versus CT abdomen and pelvis for further evaluation.  For 3 days of symptoms, I think it is too early to try and get a CT scan but if this pain persists, that would be an option.  She decided she would like to check in with her PCP and she will follow-up here as needed.  No evidence of any orthopedic problem causing the symptoms.  Follow-Up Instructions: No follow-ups on file.   Orders:  Orders Placed This Encounter  Procedures   XR Ribs  Unilateral Left   No orders of the defined types were placed in this encounter.     Procedures: No procedures performed   Clinical Data: No additional findings.  Objective: Vital Signs: There were no vitals taken for this visit.  Physical Exam:  Constitutional: Patient appears well-developed HEENT:  Head: Normocephalic Eyes:EOM are normal Neck: Normal range of motion Cardiovascular: Normal rate Pulmonary/chest: Effort normal Neurologic: Patient is alert Skin: Skin is warm Psychiatric: Patient has normal mood and affect  Ortho Exam: Ortho exam demonstrates mild tenderness over the left iliac crest but tenderness is also present to the left lower quadrant and left upper quadrant of the abdomen with no rebound tenderness.  No rigidity to her abdomen.  No focal tenderness throughout the left lower rib cage.  There is no rash or cellulitis or any abnormality noted to the skin in this area.  No pain with hip range of motion more so than her typical hip arthritis pain.  Specialty Comments:  EXAM: MRI LUMBAR SPINE WITHOUT CONTRAST   TECHNIQUE: Multiplanar, multisequence MR imaging of the lumbar spine was performed. No intravenous contrast was administered.   COMPARISON:  Radiographs 10/01/2022 and 07/31/2022.  MRI 12/04/2019.  FINDINGS: Segmentation: Conventional anatomy assumed, with the last open disc space designated L5-S1.Concordant with prior imaging.   Alignment: Grade 1 anterolisthesis at L3-4 and L4-5, similar to previous MRI.   Vertebrae: There are mild superior endplate compression fractures at the L3 and L4 levels, resulting in approximately 10% loss of vertebral body height. There is associated bone marrow edema, but no osseous retropulsion or definite involvement of the posterior elements. These fractures are not clearly seen on recent or prior radiographs. No other acute osseous findings. The visualized sacroiliac joints appear unremarkable.   Conus  medullaris: Extends to the L1-2 level and appears normal.   Paraspinal and other soft tissues: No acute paraspinal findings. The urinary bladder is noted to be distended and moderately trabeculated. 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   Disc levels:   Sagittal images demonstrate no significant disc space findings within the visualized lower thoracic spine. There is mild disc bulging at T11-12 and T12-L1.   L1-2: Preserved disc height. Mild facet hypertrophy. No spinal stenosis or nerve root encroachment.   L2-3: Preserved disc height with mild disc bulging and mild-to-moderate facet hypertrophy. No spinal stenosis or nerve root encroachment.   L3-4: Moderate to severe facet hypertrophy accounts for the grade 1 anterolisthesis and contributes to annular disc bulging and uncovering. There is resulting moderate multifactorial spinal stenosis with mild lateral recess and minimal foraminal narrowing bilaterally. The spinal stenosis has progressed from the previous MRI   L4-5: Moderate to severe facet hypertrophy accounts for the grade 1 anterolisthesis and contributes to annular disc bulging and uncovering. There is only resulting borderline spinal stenosis at this level with minimal left lateral recess and left foraminal narrowing. No nerve root encroachment. No significant changes are seen at this level.   L5-S1: Chronic disc bulging with endplate osteophytes asymmetric to the left. Mild facet hypertrophy. No spinal stenosis or nerve root encroachment.   IMPRESSION: 1. Acute/subacute mild superior endplate compression fractures at L3 and L4 with associated bone marrow edema, but no osseous retropulsion. 2. 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 3. Borderline spinal stenosis at L4-5 secondary to facet hypertrophy and a grade 1 anterolisthesis. 4. Distended urinary bladder with trabeculation, suggesting  chronic bladder outlet obstruction.     Electronically Signed   By: Elsie Perone M.D.   On: 10/26/2022 10:57  Imaging: No results found.   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   Allergic rhinitis 07/26/2015   Anxiety 07/26/2015   Benign essential hypertension 07/26/2015   Dysfunction of left eustachian tube 07/26/2015   Esophageal reflux 07/26/2015   Fibromyalgia 07/26/2015   Pure hypercholesterolemia 07/26/2015   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 05/03/2014   Pes anserinus bursitis of left knee 12/04/2013   Trochanteric bursitis of left hip 08/07/2013   Leg swelling 12/06/2011   Glaucoma 11/02/2011   HLD (hyperlipidemia) 11/02/2011   HTN (hypertension) 11/02/2011   Hypothyroid 11/02/2011  OA (osteoarthritis) 11/02/2011   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 03/16/2011   Presence of artificial knee joint 03/16/2011   Past Medical History:  Diagnosis Date   Glaucoma    High cholesterol    Hypertension    Lupus    Thyroid  disease     Family History  Problem Relation Age of Onset   Cancer Father    Cancer Brother    Cancer Brother    Stroke Brother     Past Surgical History:  Procedure Laterality Date   ABDOMINAL HYSTERECTOMY     CATARACT EXTRACTION, BILATERAL     TOTAL KNEE ARTHROPLASTY Bilateral    TUBAL LIGATION     Social History   Occupational History   Not on file   Tobacco Use   Smoking status: Former    Current packs/day: 0.00    Types: Cigarettes    Quit date: 1997    Years since quitting: 28.7   Smokeless tobacco: Never  Vaping Use   Vaping status: Never Used  Substance and Sexual Activity   Alcohol use: Yes    Alcohol/week: 3.0 standard drinks of alcohol    Types: 3 Glasses of wine per week    Comment: occ wine   Drug use: No   Sexual activity: Not on file

## 2024-03-03 DIAGNOSIS — R109 Unspecified abdominal pain: Secondary | ICD-10-CM | POA: Diagnosis not present

## 2024-03-07 ENCOUNTER — Other Ambulatory Visit: Payer: Self-pay | Admitting: Neurology

## 2024-03-07 DIAGNOSIS — R251 Tremor, unspecified: Secondary | ICD-10-CM

## 2024-04-02 ENCOUNTER — Other Ambulatory Visit: Payer: Self-pay | Admitting: Neurology

## 2024-04-02 DIAGNOSIS — R251 Tremor, unspecified: Secondary | ICD-10-CM

## 2024-04-07 ENCOUNTER — Encounter: Payer: Self-pay | Admitting: Radiology

## 2024-04-08 NOTE — Progress Notes (Deleted)
 Assessment/Plan:    1.  Hand pain and paresthesias  -EMG negative  -MRI cervical spine essentially unrevealing  -she is following with pain management with [facility]  2.  Tremor  -She has tried primidone  and propranolol .  She initially told me that neither helped, but when off of the medication and felt tremor was worse. She stated today that it wasn't helping but she decided to stay on it for now.  -continue primidone  50 mg bid  -on lyrica that was just started for hand pain  -she declines 2nd opinions  3. Bradycardia  -***pt quite bradycardic in the office today, with pulse rate only in the 40s.  She is on no medication that should cause this.  I told her to follow-up with primary care.  She is asymptomatic.  4.  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   Subjective:   Barbara Patton was seen in follow-up today.  She has wanted to stay on primidone , even though previously she was not so sure that it helped.  We had tried to go off of that and she ultimately called and wanted to go back on it.  She did see Camie Sevin since our last visit for memory changes.  She was referred for neurocognitive testing and she had that were done with Dr. Gayland in June, 2025 which demonstrated MCI.  Current medications: Primidone , 50 mg twice per day   Prior meds:  primidone , 50 mg bid; propranolol , 20 mg bid   ALLERGIES:   Allergies  Allergen Reactions   Codeine Other (See Comments)    Kept awake   Penicillin G Other (See Comments)   Penicillins Hives    Did it involve swelling of the face/tongue/throat, SOB, or low BP? N Did it involve sudden or severe rash/hives, skin peeling, or any reaction on the inside of your mouth or nose? Y Did you need to seek medical attention at a hospital or doctor's office? N When did it last happen?   Over 10 Years Ago    If all above answers are "NO", may proceed with cephalosporin  use.     CURRENT MEDICATIONS:  Outpatient Encounter Medications as of 04/11/2024  Medication Sig   acetaminophen  (TYLENOL ) 325 MG tablet Take 650 mg by mouth 2 (two) times daily as needed (pain).   Cholecalciferol (VITAMIN D -3 PO) Take 1 tablet by mouth daily.   diclofenac  Sodium (VOLTAREN ) 1 % GEL    DULoxetine (CYMBALTA) 30 MG capsule    Ergocalciferol  62.5 MCG (2500 UT) CAPS 1 po daily x 3 weeks and then weekly x 16 weeks   furosemide (LASIX) 40 MG tablet Take 40 mg by mouth daily.   hydroxychloroquine (PLAQUENIL) 200 MG tablet Take 200 mg by mouth daily.    hydrOXYzine (ATARAX/VISTARIL) 10 MG tablet Take 10 mg by mouth every 6 (six) hours as needed for itching or anxiety.   levothyroxine (SYNTHROID) 100 MCG tablet Take 100 mcg by mouth daily.   memantine  (NAMENDA ) 10 MG tablet Take 1 tablet  twice a day   Multiple Vitamins-Minerals (MULTIVITAMIN PO) Take 1 tablet by mouth daily.    omeprazole (PRILOSEC) 40 MG capsule Take 40 mg by mouth daily.   primidone  (MYSOLINE ) 50 MG tablet TAKE 1 TABLET TWICE DAILY   simvastatin (ZOCOR) 20 MG tablet Take 20 mg by mouth every evening.    triamcinolone  cream (KENALOG ) 0.1 % Apply 1  Application topically 2 (two) times daily.   [DISCONTINUED] estradiol (ESTRACE) 0.5 MG tablet    No facility-administered encounter medications on file as of 04/11/2024.     Objective:    PHYSICAL EXAMINATION:    VITALS:   There were no vitals filed for this visit.    GEN:  The patient appears stated age and is in NAD. HEENT:  Normocephalic, atraumatic.  The mucous membranes are moist. The superficial temporal arteries are without ropiness or tenderness. CV:  [place].  In bigeminy Lungs:  CTAB   Neurological examination:  Orientation: The patient is alert and oriented x3. Cranial nerves: There is good facial symmetry. The speech is fluent and clear. Soft palate rises symmetrically and there is no tongue deviation. Hearing is intact to conversational  tone. Sensation: Sensation is intact to light touch throughout Motor: Strength is at least antigravity x4.  Movement examination: Tone: There is normal tone in the bilateral upper extremities.  The tone in the lower extremities is normal.  Abnormal movements: There is no rest tremor, even with distraction procedures.  No postural tremor.  She has no intention tremor.  Archimedes spirals are drawn fairly well.  This is similar to last visit Coordination:  There is no decremation with RAM's, with any form of RAMS, including alternating supination and pronation of the forearm, hand opening and closing, finger taps, heel taps and toe taps Gait/station:  ambulates with cane and is somewhat antalgic    Cc:  Rexanne Ingle, MD

## 2024-04-09 ENCOUNTER — Ambulatory Visit: Payer: Self-pay | Admitting: Neurology

## 2024-04-09 DIAGNOSIS — Z1231 Encounter for screening mammogram for malignant neoplasm of breast: Secondary | ICD-10-CM | POA: Diagnosis not present

## 2024-04-11 ENCOUNTER — Other Ambulatory Visit: Payer: Self-pay

## 2024-04-11 ENCOUNTER — Ambulatory Visit: Admitting: Neurology

## 2024-04-11 ENCOUNTER — Encounter: Payer: Self-pay | Admitting: Sports Medicine

## 2024-04-11 ENCOUNTER — Ambulatory Visit: Admitting: Sports Medicine

## 2024-04-11 ENCOUNTER — Ambulatory Visit (INDEPENDENT_AMBULATORY_CARE_PROVIDER_SITE_OTHER): Admitting: Surgical

## 2024-04-11 DIAGNOSIS — M25552 Pain in left hip: Secondary | ICD-10-CM

## 2024-04-11 DIAGNOSIS — G8929 Other chronic pain: Secondary | ICD-10-CM | POA: Diagnosis not present

## 2024-04-11 DIAGNOSIS — M533 Sacrococcygeal disorders, not elsewhere classified: Secondary | ICD-10-CM | POA: Diagnosis not present

## 2024-04-11 DIAGNOSIS — M7918 Myalgia, other site: Secondary | ICD-10-CM | POA: Diagnosis not present

## 2024-04-11 NOTE — Progress Notes (Signed)
   Procedure Note  Patient: Barbara Patton             Date of Birth: 30-Dec-1940           MRN: [id number]             Visit Date: 04/11/2024  Procedures: Visit Diagnoses:  1. Chronic left SI joint pain   2. Pain in left buttock    U/S-guided SI-joint injection, Left   After discussion of risk/benefits/indications, informed verbal consent was obtained. A timeout was then performed. The patient was positioned in a prone position on exam room table with a pillow placed under the pelvis for mild hip flexion. The SI joint area was cleaned and prepped with betadine and alcohol swabs. Sterile ultrasound gel was applied and the ultrasound transducer was placed in an anatomic axial plane over the PSIS, then moved distally over the SI-joint. Using ultrasound guidance, a 22-gauge, 3.5 needle was inserted from a medial to lateral approach utilizing an in-plane approach and directed into the SI-joint. The SI-joint was then injected with a mixture of 4:1.5 lidocaine :depomedrol with visualization of the injectate flow into the SI-joint under ultrasound visualization. The patient tolerated the procedure well without immediate complications.  - patient tolerated procedure well, discussed post-injection protocol - follow-up with Herlene Calix, PAC as indicated; I am happy to see them as needed  Lonell Sprang, DO Primary Care Sports Medicine Physician  [facility]  This note was dictated using Dragon naturally speaking software and may contain errors in syntax, spelling, or content which have not been identified prior to signing this note.

## 2024-04-13 ENCOUNTER — Encounter: Payer: Self-pay | Admitting: Surgical

## 2024-04-13 NOTE — Progress Notes (Signed)
 Office Visit Note   Patient: Barbara Patton           Date of Birth: 1940/10/22           MRN: [id number] Visit Date: 04/11/2024 Requested by: Rexanne Ingle, MD [address] [place],  [place] [address] PCP: Rexanne Ingle, MD  Subjective: Chief Complaint  Patient presents with   Left Hip - Pain    HPI: Barbara Patton is a 83 y.o. female who presents to the office reporting left buttock pain.  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  Also has history of lumbar spine ESI's.  Currently she describes focal buttock pain that is painful with sitting and standing for long periods of time.  Under a lot of stress due to her daughter being in the hospital.  Denies any new fall or injury.  No radiating pain down past the buttock..                ROS: All systems reviewed are negative as they relate to the chief complaint within the history of present illness.  Patient denies fevers or chills.  Assessment & Plan: Visit Diagnoses:  1. Chronic left SI joint pain   2. Pain in left hip     Plan: Impression is 83 year old female who presents for evaluation of left buttock pain.  Has focal pain over the sacroiliac joint which seems to correspond with her pain description.  Discussed options availed the patient and she would like to try injection.  Will set her up for injection today with Dr. Burnetta and we will see how this does for her.  If she has continued hip pain, could consider either intra-articular hip injection versus greater trochanteric injection which have given her good relief in the past but this is typically when she has more anterior or lateral hip pain.  The buttock pain without radicular component is more of a new pain location for her.  Appreciate Dr. Burnetta fitting her in today  Follow-Up Instructions: Return if symptoms worsen or fail to  improve.   Orders:  Orders Placed This Encounter  Procedures   Ambulatory referral to Physical Therapy   AMB referral to sports medicine   No orders of the defined types were placed in this encounter.     Procedures: No procedures performed   Clinical Data: No additional findings.  Objective: Vital Signs: There were no vitals taken for this visit.  Physical Exam:  Constitutional: Patient appears well-developed HEENT:  Head: Normocephalic Eyes:EOM are normal Neck: Normal range of motion Cardiovascular: Normal rate Pulmonary/chest: Effort normal Neurologic: Patient is alert Skin: Skin is warm Psychiatric: Patient has normal mood and affect  Ortho Exam: Ortho exam demonstrates left hip with mild pain reproduced with FADIR sign and decreased passive internal rotation consistent with her history of hip arthritis.  Has tenderness over the sacroiliac joint on the left but not on the right rated moderate.  No tenderness over the greater trochanter of the left hip but some mild tenderness over the right hip.  Ambulates without Trendelenburg gait.  Has intact hip flexion, quadricep, hamstring, dorsiflexion, plantarflexion, EHL strength rated 5/5 bilaterally.  Specialty Comments:  EXAM: MRI LUMBAR SPINE WITHOUT CONTRAST   TECHNIQUE: Multiplanar, multisequence MR imaging of the lumbar spine was performed. No intravenous contrast was administered.   COMPARISON:  Radiographs 10/01/2022 and 07/31/2022.  MRI 12/04/2019.   FINDINGS: Segmentation: Conventional anatomy assumed, with the last open disc space designated L5-S1.Concordant with prior imaging.   Alignment: Grade 1 anterolisthesis at L3-4 and L4-5, similar to previous MRI.   Vertebrae: There are mild superior endplate compression fractures at the L3 and L4 levels, resulting in approximately 10% loss of vertebral body height. There is associated bone marrow edema, but no osseous retropulsion or definite involvement of  the posterior elements. These fractures are not clearly seen on recent or prior radiographs. No other acute osseous findings. The visualized sacroiliac joints appear unremarkable.   Conus medullaris: Extends to the L1-2 level and appears normal.   Paraspinal and other soft tissues: No acute paraspinal findings. The urinary bladder is noted to be distended and moderately trabeculated. 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   Disc levels:   Sagittal images demonstrate no significant disc space findings within the visualized lower thoracic spine. There is mild disc bulging at T11-12 and T12-L1.   L1-2: Preserved disc height. Mild facet hypertrophy. No spinal stenosis or nerve root encroachment.   L2-3: Preserved disc height with mild disc bulging and mild-to-moderate facet hypertrophy. No spinal stenosis or nerve root encroachment.   L3-4: Moderate to severe facet hypertrophy accounts for the grade 1 anterolisthesis and contributes to annular disc bulging and uncovering. There is resulting moderate multifactorial spinal stenosis with mild lateral recess and minimal foraminal narrowing bilaterally. The spinal stenosis has progressed from the previous MRI   L4-5: Moderate to severe facet hypertrophy accounts for the grade 1 anterolisthesis and contributes to annular disc bulging and uncovering. There is only resulting borderline spinal stenosis at this level with minimal left lateral recess and left foraminal narrowing. No nerve root encroachment. No significant changes are seen at this level.   L5-S1: Chronic disc bulging with endplate osteophytes asymmetric to the left. Mild facet hypertrophy. No spinal stenosis or nerve root encroachment.   IMPRESSION: 1. Acute/subacute mild superior endplate compression fractures at L3 and L4 with associated bone marrow edema, but no osseous retropulsion. 2. 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 3. Borderline spinal stenosis at L4-5 secondary to facet hypertrophy and a grade 1 anterolisthesis. 4. Distended urinary bladder with trabeculation, suggesting chronic bladder outlet obstruction.     Electronically Signed   By: Elsie Perone M.D.   On: 10/26/2022 10:57  Imaging: No results found.   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   Allergic rhinitis 07/26/2015   Anxiety 07/26/2015   Benign essential hypertension 07/26/2015   Dysfunction of left eustachian tube 07/26/2015   Esophageal reflux 07/26/2015   Fibromyalgia 07/26/2015   Pure hypercholesterolemia 07/26/2015   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) 05/03/2014   Pes anserinus bursitis of left knee 12/04/2013   Trochanteric bursitis of left hip 08/07/2013   Leg swelling 12/06/2011   Glaucoma 11/02/2011   HLD (hyperlipidemia) 11/02/2011   HTN (hypertension) 11/02/2011  Hypothyroid 11/02/2011   OA (osteoarthritis) 11/02/2011   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 03/16/2011   Presence of artificial knee joint 03/16/2011   Past Medical History:  Diagnosis Date   Glaucoma    High cholesterol    Hypertension    Lupus    Thyroid  disease     Family History  Problem Relation Age of Onset   Cancer Father    Cancer Brother    Cancer Brother    Stroke Brother     Past Surgical History:  Procedure  Laterality Date   ABDOMINAL HYSTERECTOMY     CATARACT EXTRACTION, BILATERAL     TOTAL KNEE ARTHROPLASTY Bilateral    TUBAL LIGATION     Social History   Occupational History   Not on file  Tobacco Use   Smoking status: Former    Current packs/day: 0.00    Types: Cigarettes    Quit date: 1997    Years since quitting: 28.8   Smokeless tobacco: Never  Vaping Use   Vaping status: Never Used  Substance and Sexual Activity   Alcohol use: Yes    Alcohol/week: 3.0 standard drinks of alcohol    Types: 3 Glasses of wine per week    Comment: occ wine   Drug use: No   Sexual activity: Not on file

## 2024-04-30 DIAGNOSIS — M797 Fibromyalgia: Secondary | ICD-10-CM | POA: Diagnosis not present

## 2024-04-30 DIAGNOSIS — Z23 Encounter for immunization: Secondary | ICD-10-CM | POA: Diagnosis not present

## 2024-04-30 DIAGNOSIS — E039 Hypothyroidism, unspecified: Secondary | ICD-10-CM | POA: Diagnosis not present

## 2024-04-30 DIAGNOSIS — F4321 Adjustment disorder with depressed mood: Secondary | ICD-10-CM | POA: Diagnosis not present

## 2024-04-30 DIAGNOSIS — E663 Overweight: Secondary | ICD-10-CM | POA: Diagnosis not present

## 2024-04-30 DIAGNOSIS — G8929 Other chronic pain: Secondary | ICD-10-CM | POA: Diagnosis not present

## 2024-04-30 DIAGNOSIS — E7800 Pure hypercholesterolemia, unspecified: Secondary | ICD-10-CM | POA: Diagnosis not present

## 2024-04-30 DIAGNOSIS — I10 Essential (primary) hypertension: Secondary | ICD-10-CM | POA: Diagnosis not present

## 2024-04-30 DIAGNOSIS — M329 Systemic lupus erythematosus, unspecified: Secondary | ICD-10-CM | POA: Diagnosis not present

## 2024-05-02 ENCOUNTER — Other Ambulatory Visit: Payer: Self-pay | Admitting: Neurology

## 2024-05-02 DIAGNOSIS — R251 Tremor, unspecified: Secondary | ICD-10-CM

## 2024-05-05 ENCOUNTER — Telehealth: Payer: Self-pay | Admitting: Neurology

## 2024-05-05 ENCOUNTER — Encounter: Payer: Self-pay | Admitting: Physical Therapy

## 2024-05-05 ENCOUNTER — Other Ambulatory Visit: Payer: Self-pay

## 2024-05-05 ENCOUNTER — Ambulatory Visit: Admitting: Physical Therapy

## 2024-05-05 DIAGNOSIS — M25552 Pain in left hip: Secondary | ICD-10-CM | POA: Diagnosis not present

## 2024-05-05 DIAGNOSIS — M6281 Muscle weakness (generalized): Secondary | ICD-10-CM

## 2024-05-05 DIAGNOSIS — M5459 Other low back pain: Secondary | ICD-10-CM | POA: Diagnosis not present

## 2024-05-05 NOTE — Telephone Encounter (Signed)
 Pt called and LM with AN. She is needing a refill on her primidone  50mg .  Pharmacy- [facility] [place] [facility]

## 2024-05-05 NOTE — Telephone Encounter (Signed)
 RX sent

## 2024-05-05 NOTE — Therapy (Signed)
 OUTPATIENT PHYSICAL THERAPY EVALUATION   Patient Name: Barbara Patton MRN: [id number] DOB:[birth year], 83 y.o., female Today's Date: 05/05/2024  END OF SESSION:  PT End of Session - 05/05/24 1246     Visit Number 1    Number of Visits 6    Date for Recertification  06/16/24    Authorization Type [employer]    Progress Note Due on Visit 10    PT Start Time 1154    PT Stop Time 1226    PT Time Calculation (min) 32 min    Activity Tolerance Patient tolerated treatment well    Behavior During Therapy WFL for tasks assessed/performed          Past Medical History:  Diagnosis Date   Glaucoma    High cholesterol    Hypertension    Lupus    Thyroid  disease    Past Surgical History:  Procedure Laterality Date   ABDOMINAL HYSTERECTOMY     CATARACT EXTRACTION, BILATERAL     TOTAL KNEE ARTHROPLASTY Bilateral    TUBAL LIGATION     Patient Active Problem List   Diagnosis Date Noted   Memory impairment 09/03/2023   Anemia 10/20/2021   Backache 10/20/2021   Bladder outlet obstruction 10/20/2021   Constipation 10/20/2021   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   Menopausal symptom 10/20/2021   Overweight 10/20/2021   History of colonic polyps 10/20/2021   Urinary incontinence 10/20/2021   Globus pharyngeus 05/23/2021   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   Referred otalgia of left ear 08/23/2015   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   Allergic rhinitis 07/26/2015   Anxiety 07/26/2015   Benign essential hypertension 07/26/2015   Dysfunction of left eustachian tube 07/26/2015   Esophageal reflux 07/26/2015   Fibromyalgia 07/26/2015   Pure hypercholesterolemia 07/26/2015   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) 05/03/2014   Pes anserinus bursitis of left knee 12/04/2013   Trochanteric bursitis of left hip 08/07/2013   Leg swelling 12/06/2011   Glaucoma 11/02/2011   HLD  (hyperlipidemia) 11/02/2011   HTN (hypertension) 11/02/2011   Hypothyroid 11/02/2011   OA (osteoarthritis) 11/02/2011   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 03/16/2011   Presence of artificial knee joint 03/16/2011    PCP: Rexanne Ingle, MD  REFERRING PROVIDER: Shirly Carlin CROME, PA-C  REFERRING DIAG: [phone] (ICD-10-CM) - Chronic left SI joint pain M25.552 (ICD-10-CM) - Pain in left hip  Rationale for Evaluation and Treatment: Rehabilitation  THERAPY DIAG:  Muscle weakness (generalized) - Plan: PT plan of care cert/re-cert  Other low back pain - Plan: PT plan of care cert/re-cert  Pain in left hip - Plan: PT plan of care cert/re-cert  ONSET DATE: chronic   SUBJECTIVE:  SUBJECTIVE STATEMENT: Pt is an 83 y.o. female who presents to the office reporting left buttock pain and leg pain  She c/o pain with forward bending and trying to cross her legs.  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  She had injection about 2 weeks ago, which she reports helped some.  PERTINENT HISTORY:  L & R TKA, fibromyalgia, HTN, lupus, glaucoma, anxiety  PAIN:  Are you having pain? Yes: NPRS scale: currently 0/10, up to 7/10 Pain location: Lt hip/buttock Pain description: pulling, tightness Aggravating factors: crossing legs, bending forward in seated position Relieving factors: avoiding provoking positions, tylenol   PRECAUTIONS:  None  RED FLAGS: None   WEIGHT BEARING RESTRICTIONS:  No  FALLS:  Has patient fallen in last 6 months? No  LIVING ENVIRONMENT: Lives with: lives alone   OCCUPATION:  Retired - Forensic Scientist  PLOF:  Independent and Leisure: watch TV, water  fitness and chair yoga, machines; [facility] 3 days[facility]  PATIENT GOALS:  Improve pain   OBJECTIVE:  Note: Objective measures were completed at Evaluation unless otherwise noted.  PATIENT SURVEYS:  Patient-Specific Activity Scoring Scheme  0 represents "unable to perform." 10 represents "able to perform at prior level. 0 1 2 3 4 5 6 7 8 9  10 (Date and Score)   Activity Eval     1. Crossing her legs  0    2. Bending forward to put sock on  4    Score 2    Total score = sum of the activity scores/number of activities Minimum detectable change (90%CI) for average score = 2 points Minimum detectable change (90%CI) for single activity score = 3 points    COGNITIVE STATUS: Within functional limits for tasks assessed   SENSATION: WFL  POSTURE:  rounded shoulders and forward head   GAIT: 05/05/24 Comments: amb mod I with SPC   PALPATION: 05/05/24 tender Lt greater trochanter  LUMBAR ROM:   ROM A/PROM  eval  Flexion WNL  Extension Limited 25%  Right quadrant Limited 25%  Left quadrant Limited 50% with pain   (Blank rows = not tested)   LOWER EXTREMITY ROM:     ROM Right eval Left eval  Hip flexion    Hip extension    Hip abduction    Hip adduction    Hip internal rotation  15  Hip external rotation  23  Knee flexion    Knee extension    Ankle dorsiflexion    Ankle plantarflexion    Ankle inversion    Ankle eversion     (Blank rows = not tested)   LOWER EXTREMITY MMT:    MMT Right eval Left eval  Hip flexion 4/5 3-/5  Hip extension    Hip abduction    Hip adduction    Hip internal rotation    Hip external rotation    Knee flexion  3/5  Knee extension  4/5  Ankle dorsiflexion    Ankle plantarflexion    Ankle inversion    Ankle eversion     (Blank rows = not tested)     TREATMENT:  DATE:  05/05/24 TherEx See  HEP - demonstrated with trial reps performed PRN, mod cues for comprehension    Self Care Educated on PT POC and clinical findings     PATIENT EDUCATION:  Education details: HEP Person educated: Patient Education method: Programmer, Multimedia, Facilities Manager, and Handouts Education comprehension: verbalized understanding, returned demonstration, and needs further education  HOME EXERCISE PROGRAM: Access Code: [email or web address] URL: [email or web address] Date: 05/05/2024 Prepared by: Corean Ku  Exercises - Standing Single Leg Hip Circles  - 1-2 x daily - 7 x weekly - 1-2 sets - 10 reps - Supine Bridge  - 1-2 x daily - 7 x weekly - 1 sets - 10 reps - 5 sec hold - Hooklying Single Knee to Chest  - 2 x daily - 7 x weekly - 1 sets - 3 reps - 30 sec hold - Supine Piriformis Stretch with Foot on Ground  - 2 x daily - 7 x weekly - 1 sets - 3 reps - 30 sec hold - Seated Flexion Stretch with Swiss Ball  - 1 x daily - 7 x weekly - 3 sets - 10 reps   ASSESSMENT:  CLINICAL IMPRESSION: Patient is a 83 y.o. female who was seen today for physical therapy evaluation and treatment for Lt hip and back pain. She demonstrates decreased ROM and strength, as well as continued pain affecting functional mobility.  She will benefit from PT to address deficits listed.   OBJECTIVE IMPAIRMENTS: decreased mobility, decreased ROM, decreased strength, hypomobility, increased fascial restrictions, impaired flexibility, and pain.   ACTIVITY LIMITATIONS: carrying, lifting, bending, sitting, standing, and locomotion level  PARTICIPATION LIMITATIONS: cleaning, laundry, shopping, and community activity  PERSONAL FACTORS: Age, Behavior pattern, Past/current experiences, Time since onset of injury/illness/exacerbation, and 3+ comorbidities: L & R TKA, fibromyalgia, HTN, lupus, glaucoma, anxiety are also affecting patient's functional outcome.   REHAB POTENTIAL: Good  CLINICAL DECISION MAKING: Evolving/moderate  complexity  EVALUATION COMPLEXITY: Moderate   GOALS: Goals reviewed with patient? Yes  SHORT TERM GOALS: Target date: 05/26/2024  Independent with initial HEP Goal status: INITIAL  LONG TERM GOALS: Target date: 06/16/2024  Independent with final HEP Goal status: INITIAL  2.  PSFS score improved by 2 points Goal status: INITIAL  3.  Lt hip strength improved to 4/5 for improved function Goal status: INIITAL  4.  Report pain < 3/10 with forward bending for improved function Goal status: INITIAL  5.  Demonstrate ability to donn socks with pain < 4/10. Goal status: INITIAL   PLAN:  PT FREQUENCY: 1x/week  PT DURATION: 6 weeks  PLANNED INTERVENTIONS: 97164- PT Re-evaluation, 97750- Physical Performance Testing, 97110-Therapeutic exercises, 97530- Therapeutic activity, [id number]- Neuromuscular re-education, 97535- Self Care, 02859- Manual therapy, G0283- Electrical stimulation (unattended), 20560 (1-2 muscles), 20561 (3+ muscles)- Dry Needling, Patient/Family education, Balance training, Taping, Joint mobilization, Joint manipulation, Cryotherapy, and Moist heat.  PLAN FOR NEXT SESSION: Review HEP, progress ROM and strengthening   NEXT MD VISIT: PRN   Corean JULIANNA Ku, PT, DPT 05/05/24 12:49 PM   Referring diagnosis? M53.3,G89.29,  M25.552 (ICD-10-CM)  Treatment diagnosis? (if different than referring diagnosis) M62.81, M54.59, M25.552 What was this (referring dx) caused by? []  Surgery []  Fall [x]  Ongoing issue [x]  Arthritis []  Other: ____________  Laterality: []  Rt [x]  Lt []  Both  Check all possible CPT codes:  *CHOOSE 10 OR LESS*    See Planned Interventions listed in the Plan section of the Evaluation.

## 2024-05-07 ENCOUNTER — Ambulatory Visit

## 2024-05-12 NOTE — Therapy (Signed)
 OUTPATIENT PHYSICAL THERAPY TREATMENT   Patient Name: Barbara Patton MRN: [id number] DOB:[birth year], 83 y.o., female Today's Date: 05/13/2024  END OF SESSION:  PT End of Session - 05/13/24 1201     Visit Number 2    Number of Visits 6    Date for Recertification  06/16/24    Authorization Type [employer]    Progress Note Due on Visit 10    PT Start Time 1201    PT Stop Time 1239    PT Time Calculation (min) 38 min    Activity Tolerance Patient tolerated treatment well    Behavior During Therapy WFL for tasks assessed/performed           Past Medical History:  Diagnosis Date   Glaucoma    High cholesterol    Hypertension    Lupus    Thyroid  disease    Past Surgical History:  Procedure Laterality Date   ABDOMINAL HYSTERECTOMY     CATARACT EXTRACTION, BILATERAL     TOTAL KNEE ARTHROPLASTY Bilateral    TUBAL LIGATION     Patient Active Problem List   Diagnosis Date Noted   Memory impairment 09/03/2023   Anemia 10/20/2021   Backache 10/20/2021   Bladder outlet obstruction 10/20/2021   Constipation 10/20/2021   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   Menopausal symptom 10/20/2021   Overweight 10/20/2021   History of colonic polyps 10/20/2021   Urinary incontinence 10/20/2021   Globus pharyngeus 05/23/2021   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   Referred otalgia of left ear 08/23/2015   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   Allergic rhinitis 07/26/2015   Anxiety 07/26/2015   Benign essential hypertension 07/26/2015   Dysfunction of left eustachian tube 07/26/2015   Esophageal reflux 07/26/2015   Fibromyalgia 07/26/2015   Pure hypercholesterolemia 07/26/2015   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) 05/03/2014   Pes anserinus bursitis of left knee 12/04/2013   Trochanteric bursitis of left hip 08/07/2013   Leg swelling 12/06/2011   Glaucoma 11/02/2011   HLD  (hyperlipidemia) 11/02/2011   HTN (hypertension) 11/02/2011   Hypothyroid 11/02/2011   OA (osteoarthritis) 11/02/2011   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 03/16/2011   Presence of artificial knee joint 03/16/2011    PCP: Rexanne Ingle, MD  REFERRING PROVIDER: Shirly Carlin CROME, PA-C  REFERRING DIAG: [phone] (ICD-10-CM) - Chronic left SI joint pain M25.552 (ICD-10-CM) - Pain in left hip  Rationale for Evaluation and Treatment: Rehabilitation  THERAPY DIAG:  Muscle weakness (generalized)  Other low back pain  Pain in left hip  Pain in right leg  Difficulty in walking, not elsewhere classified  ONSET DATE: chronic   SUBJECTIVE:  SUBJECTIVE STATEMENT: Patient reports that she has everywhere pain all the time.  Patient reports that she hasn't been compliant with exercise everyday.   PERTINENT HISTORY:  L & R TKA, fibromyalgia, HTN, lupus, glaucoma, anxiety  Pt is an 83 y.o. female who presents to the office reporting left buttock pain and leg pain  She c/o pain with forward bending and trying to cross her legs.  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  She had injection about 2 weeks ago, which she reports helped some.  PAIN:  Are you having pain? Yes: NPRS scale: 7-8/10 in Lt low back (worse with standing) Pain location: Lt hip/buttock Pain description: pulling, tightness Aggravating factors: crossing legs, bending forward in seated position Relieving factors: avoiding provoking positions, tylenol   PRECAUTIONS:  None  RED FLAGS: None   WEIGHT BEARING RESTRICTIONS:  No  FALLS:  Has patient fallen in last 6 months? No  LIVING  ENVIRONMENT: Lives with: lives alone   OCCUPATION:  Retired - Forensic Scientist  PLOF:  Independent and Leisure: watch TV, water fitness and chair yoga, machines; goes to [facility] 3 days/[facility]  PATIENT GOALS:  Improve pain   OBJECTIVE:  Note: Objective measures were completed at Evaluation unless otherwise noted.  PATIENT SURVEYS:  Patient-Specific Activity Scoring Scheme  0 represents "unable to perform." 10 represents "able to perform at prior level. 0 1 2 3 4 5 6 7 8 9  10 (Date and Score)   Activity Eval     1. Crossing her legs  0    2. Bending forward to put sock on  4    Score 2    Total score = sum of the activity scores/number of activities Minimum detectable change (90%CI) for average score = 2 points Minimum detectable change (90%CI) for single activity score = 3 points    COGNITIVE STATUS: Within functional limits for tasks assessed   SENSATION: WFL  POSTURE:  rounded shoulders and forward head   GAIT: 05/05/24 Comments: amb mod I with SPC   PALPATION: 05/05/24 tender Lt greater trochanter  LUMBAR ROM:   ROM A/PROM  eval  Flexion WNL  Extension Limited 25%  Right quadrant Limited 25%  Left quadrant Limited 50% with pain   (Blank rows = not tested)   LOWER EXTREMITY ROM:     ROM Right eval Left eval  Hip flexion    Hip extension    Hip abduction    Hip adduction    Hip internal rotation  15  Hip external rotation  23  Knee flexion    Knee extension    Ankle dorsiflexion    Ankle plantarflexion    Ankle inversion    Ankle eversion     (Blank rows = not tested)   LOWER EXTREMITY MMT:    MMT Right eval Left eval  Hip flexion 4/5 3-/5  Hip extension    Hip abduction    Hip adduction    Hip internal rotation    Hip external rotation    Knee flexion  3/5  Knee extension  4/5  Ankle dorsiflexion    Ankle plantarflexion    Ankle inversion    Ankle eversion     (Blank rows = not tested)     TREATMENT:  DATE:  05/13/2024 TherEx:  Nustep level 4 for 8 minutes with bilat UE and LE  Lower trunk rotations 2x30s each side  Hooklying single knee to chest 2x30s each side  Hooklying piriformis stretch 2x30s each side   Manual:  STM to bilat glutes/piriformis and lumbar musculature  IASTM with percussive device to bilat glutes/piriformis and lumbar musculature  PT demonstrated STM with tennis ball to bilat glutes/piriformis and discussed performing at home to assist with tight musculature and pain    05/05/24 TherEx See HEP - demonstrated with trial reps performed PRN, mod cues for comprehension    Self Care Educated on PT POC and clinical findings     PATIENT EDUCATION:  Education details: HEP Person educated: Patient Education method: Programmer, Multimedia, Facilities Manager, and Handouts Education comprehension: verbalized understanding, returned demonstration, and needs further education  HOME EXERCISE PROGRAM: Access Code: [email or web address] URL: [email or web address] Date: 05/05/2024 Prepared by: Corean Ku  Exercises - Standing Single Leg Hip Circles  - 1-2 x daily - 7 x weekly - 1-2 sets - 10 reps - Supine Bridge  - 1-2 x daily - 7 x weekly - 1 sets - 10 reps - 5 sec hold - Hooklying Single Knee to Chest  - 2 x daily - 7 x weekly - 1 sets - 3 reps - 30 sec hold - Supine Piriformis Stretch with Foot on Ground  - 2 x daily - 7 x weekly - 1 sets - 3 reps - 30 sec hold - Seated Flexion Stretch with Swiss Ball  - 1 x daily - 7 x weekly - 3 sets - 10 reps   ASSESSMENT:  CLINICAL IMPRESSION: Patient arrived to session noting high levels of pain in Lt lower back and Lt leg. Patient tolerated all activities this date and responded well to IASTM. Patient will continue to benefit from skilled PT.   OBJECTIVE IMPAIRMENTS: decreased mobility, decreased ROM, decreased strength,  hypomobility, increased fascial restrictions, impaired flexibility, and pain.   ACTIVITY LIMITATIONS: carrying, lifting, bending, sitting, standing, and locomotion level  PARTICIPATION LIMITATIONS: cleaning, laundry, shopping, and community activity  PERSONAL FACTORS: Age, Behavior pattern, Past/current experiences, Time since onset of injury/illness/exacerbation, and 3+ comorbidities: L & R TKA, fibromyalgia, HTN, lupus, glaucoma, anxiety are also affecting patient's functional outcome.   REHAB POTENTIAL: Good  CLINICAL DECISION MAKING: Evolving/moderate complexity  EVALUATION COMPLEXITY: Moderate   GOALS: Goals reviewed with patient? Yes  SHORT TERM GOALS: Target date: 05/26/2024  Independent with initial HEP Goal status: INITIAL  LONG TERM GOALS: Target date: 06/16/2024  Independent with final HEP Goal status: INITIAL  2.  PSFS score improved by 2 points Goal status: INITIAL  3.  Lt hip strength improved to 4/5 for improved function Goal status: INIITAL  4.  Report pain < 3/10 with forward bending for improved function Goal status: INITIAL  5.  Demonstrate ability to donn socks with pain < 4/10. Goal status: INITIAL   PLAN:  PT FREQUENCY: 1x/week  PT DURATION: 6 weeks  PLANNED INTERVENTIONS: 97164- PT Re-evaluation, 97750- Physical Performance Testing, 97110-Therapeutic exercises, 97530- Therapeutic activity, [id number]- Neuromuscular re-education, 97535- Self Care, 02859- Manual therapy, G0283- Electrical stimulation (unattended), 20560 (1-2 muscles), 20561 (3+ muscles)- Dry Needling, Patient/Family education, Balance training, Taping, Joint mobilization, Joint manipulation, Cryotherapy, and Moist heat.  PLAN FOR NEXT SESSION: progress ROM and strengthening, manual if needed    NEXT MD VISIT: PRN   Susannah Daring, PT, DPT 05/13/24 12:58 PM     Referring diagnosis? M53.3,G89.29,  M25.552 (ICD-10-CM)  Treatment  diagnosis? (if different than referring diagnosis)  M62.81, M54.59, M25.552 What was this (referring dx) caused by? []  Surgery []  Fall [x]  Ongoing issue [x]  Arthritis []  Other: ____________  Laterality: []  Rt [x]  Lt []  Both  Check all possible CPT codes:  *CHOOSE 10 OR LESS*    See Planned Interventions listed in the Plan section of the Evaluation.

## 2024-05-13 ENCOUNTER — Ambulatory Visit

## 2024-05-13 DIAGNOSIS — M79604 Pain in right leg: Secondary | ICD-10-CM

## 2024-05-13 DIAGNOSIS — M25552 Pain in left hip: Secondary | ICD-10-CM | POA: Diagnosis not present

## 2024-05-13 DIAGNOSIS — M5459 Other low back pain: Secondary | ICD-10-CM

## 2024-05-13 DIAGNOSIS — R262 Difficulty in walking, not elsewhere classified: Secondary | ICD-10-CM | POA: Diagnosis not present

## 2024-05-13 DIAGNOSIS — M6281 Muscle weakness (generalized): Secondary | ICD-10-CM | POA: Diagnosis not present

## 2024-05-19 NOTE — Therapy (Signed)
 OUTPATIENT PHYSICAL THERAPY TREATMENT   Patient Name: Barbara Patton MRN: [id number] DOB:January 19, 1941, 83 y.o., female Today's Date: 05/20/2024  END OF SESSION:  PT End of Session - 05/20/24 1149     Visit Number 3    Number of Visits 6    Date for Recertification  06/16/24    Authorization Type [employer]    Progress Note Due on Visit 10    PT Start Time 1149    PT Stop Time 1227    PT Time Calculation (min) 38 min    Activity Tolerance Patient tolerated treatment well    Behavior During Therapy WFL for tasks assessed/performed            Past Medical History:  Diagnosis Date   Glaucoma    High cholesterol    Hypertension    Lupus    Thyroid  disease    Past Surgical History:  Procedure Laterality Date   ABDOMINAL HYSTERECTOMY     CATARACT EXTRACTION, BILATERAL     TOTAL KNEE ARTHROPLASTY Bilateral    TUBAL LIGATION     Patient Active Problem List   Diagnosis Date Noted   Memory impairment 09/03/2023   Anemia 10/20/2021   Backache 10/20/2021   Bladder outlet obstruction 10/20/2021   Constipation 10/20/2021   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   Menopausal symptom 10/20/2021   Overweight 10/20/2021   History of colonic polyps 10/20/2021   Urinary incontinence 10/20/2021   Globus pharyngeus 05/23/2021   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   Referred otalgia of left ear 08/23/2015   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   Allergic rhinitis 07/26/2015   Anxiety 07/26/2015   Benign essential hypertension 07/26/2015   Dysfunction of left eustachian tube 07/26/2015   Esophageal reflux 07/26/2015   Fibromyalgia 07/26/2015   Pure hypercholesterolemia 07/26/2015   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) 05/03/2014   Pes anserinus bursitis of left knee 12/04/2013   Trochanteric bursitis of left hip 08/07/2013   Leg swelling 12/06/2011   Glaucoma 11/02/2011   HLD  (hyperlipidemia) 11/02/2011   HTN (hypertension) 11/02/2011   Hypothyroid 11/02/2011   OA (osteoarthritis) 11/02/2011   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 03/16/2011   Presence of artificial knee joint 03/16/2011    PCP: Rexanne Ingle, MD  REFERRING PROVIDER: Shirly Carlin CROME, PA-C  REFERRING DIAG: [phone] (ICD-10-CM) - Chronic left SI joint pain M25.552 (ICD-10-CM) - Pain in left hip  Rationale for Evaluation and Treatment: Rehabilitation  THERAPY DIAG:  Other low back pain  Muscle weakness (generalized)  Pain in left hip  ONSET DATE: chronic   SUBJECTIVE:  SUBJECTIVE STATEMENT: Patient reports feeling fatigued as she went to the [facility] prior to session today.   PERTINENT HISTORY:  L & R TKA, fibromyalgia, HTN, lupus, glaucoma, anxiety  Pt is an 83 y.o. female who presents to the office reporting left buttock pain and leg pain  She c/o pain with forward bending and trying to cross her legs.  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  She had injection about 2 weeks ago, which she reports helped some.  PAIN:  Are you having pain? Yes: NPRS scale: 7/10 at beginning of session, 3/10 after IASTM Pain location: Lt hip/buttock Pain description: pulling, tightness Aggravating factors: crossing legs, bending forward in seated position Relieving factors: avoiding provoking positions, tylenol   PRECAUTIONS:  None  RED FLAGS: None   WEIGHT BEARING RESTRICTIONS:  No  FALLS:  Has patient fallen in last 6 months? No  LIVING ENVIRONMENT: Lives with: lives alone   OCCUPATION:  Retired - Forensic Scientist  PLOF:  Independent and Leisure: watch TV, water  fitness and chair yoga, machines; [facility]  PATIENT GOALS:  Improve pain   OBJECTIVE:  Note: Objective measures were completed at Evaluation unless otherwise noted.  PATIENT SURVEYS:  Patient-Specific Activity Scoring Scheme  0 represents unable to perform. 10 represents able to perform at prior level. 0 1 2 3 4 5 6 7 8 9  10 (Date and Score)   Activity Eval     1. Crossing her legs  0    2. Bending forward to put sock on  4    Score 2    Total score = sum of the activity scores/number of activities Minimum detectable change (90%CI) for average score = 2 points Minimum detectable change (90%CI) for single activity score = 3 points    COGNITIVE STATUS: Within functional limits for tasks assessed   SENSATION: WFL  POSTURE:  rounded shoulders and forward head   GAIT: 05/05/24 Comments: amb mod I with SPC   PALPATION: 05/05/24 tender Lt greater trochanter  LUMBAR ROM:   ROM A/PROM  eval  Flexion WNL  Extension Limited 25%  Right quadrant Limited 25%  Left quadrant Limited 50% with pain   (Blank rows = not tested)   LOWER EXTREMITY ROM:     ROM Right eval Left eval  Hip flexion    Hip extension    Hip abduction    Hip adduction    Hip internal rotation  15  Hip external rotation  23  Knee flexion    Knee extension    Ankle dorsiflexion    Ankle plantarflexion    Ankle inversion    Ankle eversion     (Blank rows = not tested)   LOWER EXTREMITY MMT:    MMT Right eval Left eval  Hip flexion 4/5 3-/5  Hip extension    Hip abduction    Hip adduction    Hip internal rotation    Hip external rotation    Knee flexion  3/5  Knee extension  4/5  Ankle dorsiflexion    Ankle plantarflexion    Ankle inversion    Ankle eversion     (Blank rows = not tested)     TREATMENT:  DATE:  05/20/2024 Manual:   IASTM with percussive device to bilat glutes/piriformis, lumbar musculature, and thoracic musculature   TherEx:  Nustep level 4 for 6 minutes  Step ups with 4 step, no difficulty so increased to 6 step 1x10 each LE with UE use on // bars for stability Lateral step ups with 6 step 1x8, 1x12  each side with UE use on // bars for stability Standing marches with 2# weights on ankles 2x15 each side    05/13/2024 TherEx:  Nustep level 4 for 8 minutes with bilat UE and LE  Lower trunk rotations 2x30s each side  Hooklying single knee to chest 2x30s each side  Hooklying piriformis stretch 2x30s each side   Manual:  STM to bilat glutes/piriformis and lumbar musculature  IASTM with percussive device to bilat glutes/piriformis and lumbar musculature  PT demonstrated STM with tennis ball to bilat glutes/piriformis and discussed performing at home to assist with tight musculature and pain    05/05/24 TherEx See HEP - demonstrated with trial reps performed PRN, mod cues for comprehension    Self Care Educated on PT POC and clinical findings     PATIENT EDUCATION:  Education details: HEP Person educated: Patient Education method: Programmer, Multimedia, Facilities Manager, and Handouts Education comprehension: verbalized understanding, returned demonstration, and needs further education  HOME EXERCISE PROGRAM: Access Code: [email or web address] URL: [email or web address] Date: 05/05/2024 Prepared by: Corean Ku  Exercises - Standing Single Leg Hip Circles  - 1-2 x daily - 7 x weekly - 1-2 sets - 10 reps - Supine Bridge  - 1-2 x daily - 7 x weekly - 1 sets - 10 reps - 5 sec hold - Hooklying Single Knee to Chest  - 2 x daily - 7 x weekly - 1 sets - 3 reps - 30 sec hold - Supine Piriformis Stretch with Foot on Ground  - 2 x daily - 7 x weekly - 1 sets - 3 reps - 30 sec hold - Seated Flexion Stretch with Swiss Ball  - 1 x daily - 7 x weekly - 3 sets - 10 reps   ASSESSMENT:  CLINICAL  IMPRESSION:  Patient arrived to session noting neck, back, and Lt groin pain that has improved over the last 24 hours. Following IASTM and Nustep, patient felt an improvement in symptoms and tolerated all activities. Patient will continue to benefit from skilled PT.   OBJECTIVE IMPAIRMENTS: decreased mobility, decreased ROM, decreased strength, hypomobility, increased fascial restrictions, impaired flexibility, and pain.   ACTIVITY LIMITATIONS: carrying, lifting, bending, sitting, standing, and locomotion level  PARTICIPATION LIMITATIONS: cleaning, laundry, shopping, and community activity  PERSONAL FACTORS: Age, Behavior pattern, Past/current experiences, Time since onset of injury/illness/exacerbation, and 3+ comorbidities: L & R TKA, fibromyalgia, HTN, lupus, glaucoma, anxiety are also affecting patient's functional outcome.   REHAB POTENTIAL: Good  CLINICAL DECISION MAKING: Evolving/moderate complexity  EVALUATION COMPLEXITY: Moderate   GOALS: Goals reviewed with patient? Yes  SHORT TERM GOALS: Target date: 05/26/2024  Independent with initial HEP Goal status: INITIAL  LONG TERM GOALS: Target date: 06/16/2024  Independent with final HEP Goal status: INITIAL  2.  PSFS score improved by 2 points Goal status: INITIAL  3.  Lt hip strength improved to 4/5 for improved function Goal status: INIITAL  4.  Report pain < 3/10 with forward bending for improved function Goal status: INITIAL  5.  Demonstrate ability to donn socks with pain < 4/10. Goal status: INITIAL   PLAN:  PT FREQUENCY: 1x/week  PT DURATION: 6 weeks  PLANNED INTERVENTIONS: 97164- PT Re-evaluation, 97750- Physical Performance Testing, 97110-Therapeutic exercises, 97530- Therapeutic activity, [id number]- Neuromuscular re-education, 97535- Self Care, 02859- Manual therapy, G0283- Electrical stimulation (unattended), 20560 (1-2 muscles), 20561 (3+ muscles)- Dry Needling, Patient/Family education, Balance training,  Taping, Joint mobilization, Joint manipulation, Cryotherapy, and Moist heat.  PLAN FOR NEXT SESSION:  progress ROM and strengthening, manual if needed    NEXT MD VISIT: PRN   Susannah Daring, PT, DPT 05/20/2024 1:04 PM     Referring diagnosis? M53.3,G89.29,  M25.552 (ICD-10-CM)  Treatment diagnosis? (if different than referring diagnosis) M62.81, M54.59, M25.552 What was this (referring dx) caused by? []  Surgery []  Fall [x]  Ongoing issue [x]  Arthritis []  Other: ____________  Laterality: []  Rt [x]  Lt []  Both  Check all possible CPT codes:  *CHOOSE 10 OR LESS*    See Planned Interventions listed in the Plan section of the Evaluation.

## 2024-05-20 ENCOUNTER — Ambulatory Visit

## 2024-05-20 DIAGNOSIS — M6281 Muscle weakness (generalized): Secondary | ICD-10-CM

## 2024-05-20 DIAGNOSIS — M5459 Other low back pain: Secondary | ICD-10-CM

## 2024-05-20 DIAGNOSIS — M25552 Pain in left hip: Secondary | ICD-10-CM

## 2024-05-26 ENCOUNTER — Ambulatory Visit (INDEPENDENT_AMBULATORY_CARE_PROVIDER_SITE_OTHER): Admitting: Physical Therapy

## 2024-05-26 ENCOUNTER — Encounter: Payer: Self-pay | Admitting: Physical Therapy

## 2024-05-26 DIAGNOSIS — M5459 Other low back pain: Secondary | ICD-10-CM | POA: Diagnosis not present

## 2024-05-26 DIAGNOSIS — M79604 Pain in right leg: Secondary | ICD-10-CM

## 2024-05-26 DIAGNOSIS — M25552 Pain in left hip: Secondary | ICD-10-CM | POA: Diagnosis not present

## 2024-05-26 DIAGNOSIS — M6281 Muscle weakness (generalized): Secondary | ICD-10-CM

## 2024-05-26 NOTE — Therapy (Signed)
 " OUTPATIENT PHYSICAL THERAPY TREATMENT   Patient Name: Barbara Patton MRN: [id number] DOB:1941/03/30, 84 y.o., female Today's Date: 05/26/2024  END OF SESSION:  PT End of Session - 05/26/24 1016     Visit Number 4    Number of Visits 6    Date for Recertification  06/16/24    Authorization Type [employer]R    Progress Note Due on Visit 10    PT Start Time 1013    PT Stop Time 1056    PT Time Calculation (min) 43 min    Activity Tolerance Patient tolerated treatment well    Behavior During Therapy WFL for tasks assessed/performed             Past Medical History:  Diagnosis Date   Glaucoma    High cholesterol    Hypertension    Lupus    Thyroid  disease    Past Surgical History:  Procedure Laterality Date   ABDOMINAL HYSTERECTOMY     CATARACT EXTRACTION, BILATERAL     TOTAL KNEE ARTHROPLASTY Bilateral    TUBAL LIGATION     Patient Active Problem List   Diagnosis Date Noted   Memory impairment 09/03/2023   Anemia 10/20/2021   Backache 10/20/2021   Bladder outlet obstruction 10/20/2021   Constipation 10/20/2021   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   Menopausal symptom 10/20/2021   Overweight 10/20/2021   History of colonic polyps 10/20/2021   Urinary incontinence 10/20/2021   Globus pharyngeus 05/23/2021   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   Referred otalgia of left ear 08/23/2015   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   Allergic rhinitis 07/26/2015   Anxiety 07/26/2015   Benign essential hypertension 07/26/2015   Dysfunction of left eustachian tube 07/26/2015   Esophageal reflux 07/26/2015   Fibromyalgia 07/26/2015   Pure hypercholesterolemia 07/26/2015   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) 05/03/2014   Pes anserinus bursitis of left knee 12/04/2013   Trochanteric bursitis of left hip 08/07/2013   Leg swelling 12/06/2011   Glaucoma 11/02/2011   HLD  (hyperlipidemia) 11/02/2011   HTN (hypertension) 11/02/2011   Hypothyroid 11/02/2011   OA (osteoarthritis) 11/02/2011   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 03/16/2011   Presence of artificial knee joint 03/16/2011    PCP: Rexanne Ingle, MD  REFERRING PROVIDER: Shirly Carlin CROME, PA-C  REFERRING DIAG: [phone] (ICD-10-CM) - Chronic left SI joint pain M25.552 (ICD-10-CM) - Pain in left hip  Rationale for Evaluation and Treatment: Rehabilitation  THERAPY DIAG:  Other low back pain  Muscle weakness (generalized)  Pain in left hip  Pain in right leg  ONSET DATE: chronic   SUBJECTIVE:  SUBJECTIVE STATEMENT: Doing pretty well today.    PERTINENT HISTORY:  L & R TKA, fibromyalgia, HTN, lupus, glaucoma, anxiety  Pt is an 83 y.o. female who presents to the office reporting left buttock pain and leg pain  She c/o pain with forward bending and trying to cross her legs.  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  She had injection about 2 weeks ago, which she reports helped some.  PAIN:  Are you having pain? Yes: NPRS scale: /10 at beginning of session, 3/10 after IASTM Pain location: Lt hip/buttock Pain description: pulling, tightness Aggravating factors: crossing legs, bending forward in seated position Relieving factors: avoiding provoking positions, tylenol   PRECAUTIONS:  None  RED FLAGS: None   WEIGHT BEARING RESTRICTIONS:  No  FALLS:  Has patient fallen in last 6 months? No  LIVING ENVIRONMENT: Lives with: lives alone   OCCUPATION:  Retired - Forensic Scientist  PLOF:  Independent and Leisure: watch TV, water fitness and chair yoga, machines; [facility]  PATIENT GOALS:  Improve pain   OBJECTIVE:  Note: Objective measures were completed at Evaluation unless otherwise noted.  PATIENT SURVEYS:  Patient-Specific Activity Scoring Scheme  0 represents unable to perform. 10 represents able to perform at prior level. 0 1 2 3 4 5 6 7 8 9  10 (Date and Score)   Activity Eval     1. Crossing her legs  0    2. Bending forward to put sock on  4    Score 2    Total score = sum of the activity scores/number of activities Minimum detectable change (90%CI) for average score = 2 points Minimum detectable change (90%CI) for single activity score = 3 points    COGNITIVE STATUS: Within functional limits for tasks assessed   SENSATION: WFL  POSTURE:  rounded shoulders and forward head   GAIT: 05/05/24 Comments: amb mod I with SPC   PALPATION: 05/05/24 tender Lt greater trochanter  LUMBAR ROM:   ROM A/PROM  eval  Flexion WNL  Extension Limited 25%  Right quadrant Limited 25%  Left quadrant Limited 50% with pain   (Blank rows = not tested)   LOWER EXTREMITY ROM:     ROM Right eval Left eval  Hip flexion    Hip extension    Hip abduction    Hip adduction    Hip internal rotation  15  Hip external rotation  23  Knee flexion    Knee extension    Ankle dorsiflexion    Ankle plantarflexion    Ankle inversion    Ankle eversion     (Blank rows = not tested)   LOWER EXTREMITY MMT:    MMT Right eval Left eval  Hip flexion 4/5 3-/5  Hip extension    Hip abduction    Hip adduction    Hip internal rotation    Hip external rotation    Knee flexion  3/5  Knee extension  4/5  Ankle dorsiflexion    Ankle plantarflexion    Ankle inversion    Ankle eversion     (Blank rows = not tested)     TREATMENT:  DATE:  05/26/24 TherAct NuStep L5 x 8 min; 4 extremities for  endurance and strengthening Small range bridges 10 x 5 sec hold Sit to/from stand with 10# KB 2x10 Seated chest press with 10# KB 2x10   TherEx Lower trunk rotation 3x30 sec Single knee to chest 3x30 sec bil   05/20/2024 Manual:  IASTM with percussive device to bilat glutes/piriformis, lumbar musculature, and thoracic musculature   TherEx:  Nustep level 4 for 6 minutes  Step ups with 4 step, no difficulty so increased to 6 step 1x10 each LE with UE use on // bars for stability Lateral step ups with 6 step 1x8, 1x12  each side with UE use on // bars for stability Standing marches with 2# weights on ankles 2x15 each side    05/13/2024 TherEx:  Nustep level 4 for 8 minutes with bilat UE and LE  Lower trunk rotations 2x30s each side  Hooklying single knee to chest 2x30s each side  Hooklying piriformis stretch 2x30s each side   Manual:  STM to bilat glutes/piriformis and lumbar musculature  IASTM with percussive device to bilat glutes/piriformis and lumbar musculature  PT demonstrated STM with tennis ball to bilat glutes/piriformis and discussed performing at home to assist with tight musculature and pain    05/05/24 TherEx See HEP - demonstrated with trial reps performed PRN, mod cues for comprehension    Self Care Educated on PT POC and clinical findings     PATIENT EDUCATION:  Education details: HEP Person educated: Patient Education method: Programmer, Multimedia, Facilities Manager, and Handouts Education comprehension: verbalized understanding, returned demonstration, and needs further education  HOME EXERCISE PROGRAM: Access Code: [email or web address] URL: [email or web address] Date: 05/05/2024 Prepared by: Corean Ku  Exercises - Standing Single Leg Hip Circles  - 1-2 x daily - 7 x weekly - 1-2 sets - 10 reps - Supine Bridge  - 1-2 x daily - 7 x weekly - 1 sets - 10 reps - 5 sec hold - Hooklying Single Knee to Chest  - 2 x daily - 7 x weekly - 1 sets - 3 reps  - 30 sec hold - Supine Piriformis Stretch with Foot on Ground  - 2 x daily - 7 x weekly - 1 sets - 3 reps - 30 sec hold - Seated Flexion Stretch with Swiss Ball  - 1 x daily - 7 x weekly - 3 sets - 10 reps   ASSESSMENT:  CLINICAL IMPRESSION: Pt tolerated session well today, did work on some more functional strengthening with fair tolerance.  Continue skilled PT.   OBJECTIVE IMPAIRMENTS: decreased mobility, decreased ROM, decreased strength, hypomobility, increased fascial restrictions, impaired flexibility, and pain.   ACTIVITY LIMITATIONS: carrying, lifting, bending, sitting, standing, and locomotion level  PARTICIPATION LIMITATIONS: cleaning, laundry, shopping, and community activity  PERSONAL FACTORS: Age, Behavior pattern, Past/current experiences, Time since onset of injury/illness/exacerbation, and 3+ comorbidities: L & R TKA, fibromyalgia, HTN, lupus, glaucoma, anxiety are also affecting patient's functional outcome.   REHAB POTENTIAL: Good  CLINICAL DECISION MAKING: Evolving/moderate complexity  EVALUATION COMPLEXITY: Moderate   GOALS: Goals reviewed with patient? Yes  SHORT TERM GOALS: Target date: 05/26/2024  Independent with initial HEP Goal status: INITIAL  LONG TERM GOALS: Target date: 06/16/2024  Independent with final HEP Goal status: INITIAL  2.  PSFS score improved by 2 points Goal status: INITIAL  3.  Lt hip strength improved to 4/5 for improved function Goal status: INIITAL  4.  Report pain < 3/10 with forward bending for improved function Goal  status: INITIAL  5.  Demonstrate ability to donn socks with pain < 4/10. Goal status: INITIAL   PLAN:  PT FREQUENCY: 1x/week  PT DURATION: 6 weeks  PLANNED INTERVENTIONS: 97164- PT Re-evaluation, 97750- Physical Performance Testing, 97110-Therapeutic exercises, 97530- Therapeutic activity, [id number]- Neuromuscular re-education, 97535- Self Care, 02859- Manual therapy, G0283- Electrical stimulation  (unattended), 20560 (1-2 muscles), 20561 (3+ muscles)- Dry Needling, Patient/Family education, Balance training, Taping, Joint mobilization, Joint manipulation, Cryotherapy, and Moist heat.  PLAN FOR NEXT SESSION:  look at goals,  progress ROM and strengthening, manual if needed    NEXT MD VISIT: PRN   Corean JULIANNA Ku, PT, DPT 05/26/2024 11:12 AM     Referring diagnosis? M53.3,G89.29,  M25.552 (ICD-10-CM)  Treatment diagnosis? (if different than referring diagnosis) M62.81, M54.59, M25.552 What was this (referring dx) caused by? []  Surgery []  Fall [x]  Ongoing issue [x]  Arthritis []  Other: ____________  Laterality: []  Rt [x]  Lt []  Both  Check all possible CPT codes:  *CHOOSE 10 OR LESS*    See Planned Interventions listed in the Plan section of the Evaluation.    "

## 2024-06-11 ENCOUNTER — Encounter: Admitting: Physical Therapy

## 2024-06-11 NOTE — Progress Notes (Signed)
 "   Mild Cognitive Impairment, likely multifactorial   Barbara Patton is a very pleasant 84 y.o. RH female with a history of hypertension, hyperlipidemia, tremors, fibromyalgia, vitamin D  deficiency, SLP, chronic uveitis of the right eye, osteoarthritis and a diagnosis of mild cognitive impairment, likely multifactorial in the setting of chronic vascular disease with cerebrovascular pathology, reduced hearing per latest neuropsych evaluation, not strongly suggesting the presence of Alzheimer's disease at this time presenting today in follow-up for evaluation of memory loss. Patient is on memantine  10 mg twice daily.  Patient was last seen on 12/12/2023 . Memory is stable with MMSE today at 25/30. Patient is able to participate on ADLs and to drive without difficulties. Mood is follow-up with counseling especially after the death of her daughter.  She is on medications by PCP.  Her tremors are well-controlled with primidone  50 mg twice a day. This patient is here alone.  Previous records as well as any outside records available were reviewed prior to todays visit   Continue donepezil  10 mg daily, side effects discussed Repeat neuropsych evaluation scheduled for July 2026 Monitor hearing with audiologist in an effort to improve comprehension Continue to control mood as per PCP Recommend good control of cardiovascular risk factors [place] up in 6 months   Hand pain and paresthesias EMG negative MRI of the C-spine unremarkable Follow-up with pain management with [facility].   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 Joya  Tremors Controlled with primidone  50 mg twice daily She is on Lyrica for chronic pain  Follow-up with Dr. Evonnie on April 2026   Discussed the use of AI scribe software for clinical note transcription with the patient, who gave verbal consent to proceed.  History of Present Illness Barbara Patton is an 84 year old female who presents  with memory loss.  Memory issues have remained stable since the last visit in July of the previous year. She experiences difficulties with both short and long-term memory, particularly with new information, recent conversations, and names of people she knows. She describes instances where she struggles to recall names or events, noting that sometimes the memory returns and sometimes it does not. She does not engage in brain games but stays active mentally and physically, by going to the [facility] on Tuesdays and Thursdays, and is trying to return to Fridays as well. No personality or mood changes She is experiencing situational depression after the unexpected death of her daughter in 03-Dec-2025after the hospital stay. She describes the events leading to her daughter's passing, including her attempts to use a breathing machine and subsequent collapse. Denies any diarrhea.  No hallucinations or paranoia. Sleeps well unless pain wakes her, typically sleeping about five hours.  She reports vivid dreams about family but no nightmares, and does not move excessively during sleep. Manages her own medications and finances without issues and continues to cook, though less frequently, without forgetting recipes or ingredients. No kitchen accidents or significant appetite changes, though she notes she does not eat much. Occasionally takes a protein supplement but struggles to recall its name.  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 No recent falls or head injuries.  She reports having chronic hand pain which sometimes makes it difficult  to grasp objects. She has tremors in both hands, more pronounced in the left, and takes primidone  50 mg twice daily with good control.  Has an underactive  bladder, experiencing slow urine stream and retention. Takes tamsulosin 0.4 mg daily for this condition. Reports regular bowel movements without diarrhea or constipation.  She continues to drive without getting lost and denying any issues.     Initial visit 09/03/2023   How long did patient have memory difficulties?  For about 1 year.  Patient reports some difficulty remembering new information, recent conversations, names. Both STM and LTM are affected repeats oneself?  Denies Disoriented when walking into a room?  Patient denies expect for sometimes not remembering what she was in the kitchen for.  Leaving objects in unusual places?  denies   Wandering behavior? denies   Any personality changes, or depression, anxiety? Denies. Hallucinations or paranoia? Denies. I am missing a ring and she claims she did not touch it   Seizures? denies    Any sleep changes?  Sleeps well but not as long as I want to, the pain wakes me up. Reports vivid dreams but mostly about family, REM behavior or sleepwalking   Sleep apnea? Denies.   Any hygiene concerns?  Denies.   Independent of bathing and dressing? Endorsed  Does the patient need help with medications? Patient is in charge   Who is in charge of the finances? Patient is in charge    Any changes in appetite?   Denies. Appetite is good.     Patient have trouble swallowing?  Denies.   Does the patient cook? Yes, denies any kitchen accidents. Any headaches?  Denies.   Chronic pain? Denies.   Ambulates with difficulty? Denies, but needs a cane to ambulate for stability.   Recent falls or head injuries?  She had 2 mechanical falls, one she was bending over and fell. Another time she was getting out of the commode and I didn't hold myself well. No LOC or head injury Vision changes?  Denies any new issues.  Has a history of Lupus in the L eye, I take medicine for the last 2 years.  Any strokelike symptoms? Denies.   Any tremors? Not recently at  rest, only when I try to pick up something Any anosmia? Denies.   Any incontinence of urine?Endorsed, has residual. If I go, I have to go back, need diapers Any bowel dysfunction? Denies.      Daughter lives with her History of heavy alcohol intake? One 8 oz wine 5 days a week.   History of heavy tobacco use? Denies.   Family history of dementia? Denies  Does patient drive? 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    Neurocognitive testing June 2025  Results of the current evaluation indicated variability across measures of processing speed, executive functioning, visuospatial functioning, and learning/memory. In the setting of broadly preserved functional independence, findings are consistent with a diagnosis of mild cognitive impairment. 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 At this time, her profile does not strongly suggest Alzheimer's disease, though this cannot be definitively ruled out. Serial assessment will be important for clarifying the underlying etiology, monitoring progression, and adjusting the treatment plan as needed.   Past Medical History:  Diagnosis Date   Glaucoma    High cholesterol    Hypertension    Lupus    Thyroid  disease      Past Surgical History:  Procedure Laterality Date   ABDOMINAL HYSTERECTOMY     CATARACT EXTRACTION, BILATERAL     TOTAL KNEE ARTHROPLASTY Bilateral    TUBAL LIGATION           Objective:     PHYSICAL EXAMINATION:    VITALS:   Vitals:   06/13/24 1107 06/13/24 1113  BP: (!) 145/69 (!) 140/60  Pulse: 69   Resp: 20   SpO2: 100%   Weight: 200 lb (90.7 kg)   Height: 5' 6 (1.676 m)     GEN:  The patient appears stated age and is in NAD. HEENT:   Normocephalic, atraumatic.   Neurological examination:  General: NAD, well-groomed, appears stated age. Orientation: The patient is alert. Oriented to person, place and  to date.  Cranial nerves: There is good facial symmetry.The speech is fluent and clear. No aphasia or dysarthria. Fund of knowledge is appropriate. Recent memory impaired and remote memory is normal.  Attention and concentration reduced.  Able to name objects and repeat phrases.  Hearing is mildly decreased to conversational tone.   Delayed recall 2/3 Sensation: Sensation is intact to light touch throughout Motor: Strength is at least antigravity x4. DTR's 2/4 in UE/LE      09/03/2023    1:00 PM  Montreal Cognitive Assessment   Visuospatial/ Executive (0/5) 2  Naming (0/3) 2  Attention: Read list of digits (0/2) 2  Attention: Read list of letters (0/1) 1  Attention: Serial 7 subtraction starting at 100 (0/3) 2  Language: Repeat phrase (0/2) 1  Language : Fluency (0/1) 0  Abstraction (0/2) 0  Delayed Recall (0/5) 2  Orientation (0/6) 6  Total 18  Adjusted Score (based on education) 19       06/13/2024    2:00 PM  MMSE - Mini Mental State Exam  Orientation to time 5  Orientation to Place 5  Registration 3  Attention/ Calculation 2  Recall 2  Language- name 2 objects 2  Language- repeat 1  Language- follow 3 step command 3  Language- read & follow direction 1  Write a sentence 1  Copy design 0  Total score 25      Movement examination: Tone: There is normal tone in the UE/LE, no cogwheeling Abnormal movements: Mild intention left greater than right tremor, no resting tremor, no other parkinsonian signs.  No myoclonus.  No asterixis.   Coordination:  There is no decremation with RAM's. Normal finger to nose  Gait and Station: The patient has no difficulty arising out of a deep-seated chair without the use of the hands.  Decreased right arm swing.  The patient's stride length is short.  Gait is cautious and  broad-based.  Thank you for allowing us  the opportunity to participate in the care of this nice patient. Please do not hesitate to contact us  for any questions or concerns.   Total time spent on today's visit was 25 minutes dedicated to this patient today, preparing to see patient, examining the patient, ordering tests and/or medications and counseling the patient, documenting clinical information in the EHR or other health record, independently interpreting results and communicating results to the patient/family, discussing treatment and goals, answering patient's questions and coordinating care.  Cc:  Rexanne Ingle, MD  Camie Sevin 06/13/2024 2:08 PM      "

## 2024-06-13 ENCOUNTER — Encounter: Payer: Self-pay | Admitting: Physician Assistant

## 2024-06-13 ENCOUNTER — Ambulatory Visit (INDEPENDENT_AMBULATORY_CARE_PROVIDER_SITE_OTHER): Admitting: Physician Assistant

## 2024-06-13 VITALS — BP 140/60 | HR 69 | Resp 20 | Ht 66.0 in | Wt 200.0 lb

## 2024-06-13 DIAGNOSIS — G3184 Mild cognitive impairment, so stated: Secondary | ICD-10-CM

## 2024-06-13 DIAGNOSIS — R251 Tremor, unspecified: Secondary | ICD-10-CM

## 2024-06-13 NOTE — Patient Instructions (Signed)
 It was a pleasure to see you today at our office.   Recommendations   Continue Memantine  10 mg as directed   Follow up in 6 months Follow up with Dr. Evonnie for tremors as scheduled.   [email or web address]   RECOMMENDATIONS FOR ALL PATIENTS WITH MEMORY PROBLEMS: 1. Continue to exercise (Recommend 30 minutes of walking everyday, or 3 hours every week) 2. Increase social interactions - continue going to [place] and enjoy social gatherings with friends and family 3. Eat healthy, avoid fried foods and eat more fruits and vegetables 4. Maintain adequate blood pressure, blood sugar, and blood cholesterol level. Reducing the risk of stroke and cardiovascular disease also helps promoting better memory. 5. Avoid stressful situations. Live a simple life and avoid aggravations. Organize your time and prepare for the next day in anticipation. 6. Sleep well, avoid any interruptions of sleep and avoid any distractions in the bedroom that may interfere with adequate sleep quality 7. Avoid sugar, avoid sweets as there is a strong link between excessive sugar intake, diabetes, and cognitive impairment We discussed the Mediterranean diet, which has been shown to help patients reduce the risk of progressive memory disorders and reduces cardiovascular risk. This includes eating fish, eat fruits and green leafy vegetables, nuts like almonds and hazelnuts, walnuts, and also use olive oil. Avoid fast foods and fried foods as much as possible. Avoid sweets and sugar as sugar use has been linked to worsening of memory function.  There is always a concern of gradual progression of memory problems. If this is the case, then we may need to adjust level of care according to patient needs. Support, both to the patient and caregiver, should then be put into place.       DRIVING: Regarding driving, in patients with progressive memory problems, driving will be impaired. We  advise to have someone else do the driving if trouble finding directions or if minor accidents are reported. Independent driving assessment is available to determine safety of driving.   If you are interested in the driving assessment, you can contact the following:  [facility] in [place] [phone]  [facility] [phone]  [facility] [phone]  [facility] [phone] or [phone]   FALL PRECAUTIONS: Be cautious when walking. Scan the area for obstacles that may increase the risk of trips and falls. When getting up in the mornings, sit up at the edge of the bed for a few minutes before getting out of bed. Consider elevating the bed at the head end to avoid drop of blood pressure when getting up. Walk always in a well-lit room (use night lights in the walls). Avoid area rugs or power cords from appliances in the middle of the walkways. Use a walker or a cane if necessary and consider physical therapy for balance exercise. Get your eyesight checked regularly.  FINANCIAL OVERSIGHT: Supervision, especially oversight when making financial decisions or transactions is also recommended.  HOME SAFETY: Consider the safety of the kitchen when operating appliances like stoves, microwave oven, and blender. Consider having supervision and share cooking responsibilities until no longer able to participate in those. Accidents with firearms and other hazards in the house should be identified and addressed as well.   ABILITY TO BE LEFT ALONE: If patient is unable to contact 911 operator, consider using LifeLine, or when the need is there, arrange for someone to stay with patients. Smoking is a fire hazard, consider supervision or cessation. Risk of wandering should be assessed by  caregiver and if detected at any point, supervision and safe proof recommendations should be instituted.  MEDICATION SUPERVISION: Inability to self-administer medication  needs to be constantly addressed. Implement a mechanism to ensure safe administration of the medications.      Mediterranean Diet A Mediterranean diet refers to food and lifestyle choices that are based on the traditions of countries located on the [place]. This way of eating has been shown to help prevent certain conditions and improve outcomes for people who have chronic diseases, like kidney disease and heart disease. What are tips for following this plan? Lifestyle  Cook and eat meals together with your family, when possible. Drink enough fluid to keep your urine clear or pale yellow. Be physically active every day. This includes: Aerobic exercise like running or swimming. Leisure activities like gardening, walking, or housework. Get 7-8 hours of sleep each night. If recommended by your health care provider, drink red wine in moderation. This means 1 glass a day for nonpregnant women and 2 glasses a day for men. A glass of wine equals 5 oz (150 mL). Reading food labels  Check the serving size of packaged foods. For foods such as rice and pasta, the serving size refers to the amount of cooked product, not dry. Check the total fat in packaged foods. Avoid foods that have saturated fat or trans fats. Check the ingredients list for added sugars, such as corn syrup. Shopping  At the grocery store, buy most of your food from the areas near the walls of the store. This includes: Fresh fruits and vegetables (produce). Grains, beans, nuts, and seeds. Some of these may be available in unpackaged forms or large amounts (in bulk). Fresh seafood. Poultry and eggs. Low-fat dairy products. Buy whole ingredients instead of prepackaged foods. Buy fresh fruits and vegetables in-season from local farmers markets. Buy frozen fruits and vegetables in resealable bags. If you do not have access to quality fresh seafood, buy precooked frozen shrimp or canned fish, such as tuna, salmon, or  sardines. Buy small amounts of raw or cooked vegetables, salads, or olives from the deli or salad bar at your store. Stock your pantry so you always have certain foods on hand, such as olive oil, canned tuna, canned tomatoes, rice, pasta, and beans. Cooking  Cook foods with extra-virgin olive oil instead of using butter or other vegetable oils. Have meat as a side dish, and have vegetables or grains as your main dish. This means having meat in small portions or adding small amounts of meat to foods like pasta or stew. Use beans or vegetables instead of meat in common dishes like chili or lasagna. Experiment with different cooking methods. Try roasting or broiling vegetables instead of steaming or sauteing them. Add frozen vegetables to soups, stews, pasta, or rice. Add nuts or seeds for added healthy fat at each meal. You can add these to yogurt, salads, or vegetable dishes. Marinate fish or vegetables using olive oil, lemon juice, garlic, and fresh herbs. Meal planning  Plan to eat 1 vegetarian meal one day each week. Try to work up to 2 vegetarian meals, if possible. Eat seafood 2 or more times a week. Have healthy snacks readily available, such as: Vegetable sticks with hummus. Greek yogurt. Fruit and nut trail mix. Eat balanced meals throughout the week. This includes: Fruit: 2-3 servings a day Vegetables: 4-5 servings a day Low-fat dairy: 2 servings a day Fish, poultry, or lean meat: 1 serving a day Beans and legumes: 2  or more servings a week Nuts and seeds: 1-2 servings a day Whole grains: 6-8 servings a day Extra-virgin olive oil: 3-4 servings a day Limit red meat and sweets to only a few servings a month What are my food choices? Mediterranean diet Recommended Grains: Whole-grain pasta. Brown rice. Bulgar wheat. Polenta. Couscous. Whole-wheat bread. [place] Made[place]. Vegetables: Artichokes. Beets. Broccoli. Cabbage. Carrots. Eggplant. Green beans. Chard. Kale. Spinach.  Onions. Leeks. Peas. Squash. Tomatoes. Peppers. Radishes. Fruits: Apples. Apricots. Avocado. Berries. Bananas. Cherries. Dates. Figs. Grapes. Lemons. Melon. Oranges. Peaches. Plums. Pomegranate. Meats and other protein foods: Beans. Almonds. Sunflower seeds. Pine nuts. Peanuts. Cod. Salmon. Scallops. Shrimp. Tuna. Tilapia. Clams. Oysters. Eggs. Dairy: Low-fat milk. Cheese. Greek yogurt. Beverages: Water. Red wine. Herbal tea. Fats and oils: Extra virgin olive oil. Avocado oil. Grape seed oil. Sweets and desserts: Greek yogurt with honey. Baked apples. Poached pears. Trail mix. Seasoning and other foods: Basil. Cilantro. Coriander. Cumin. Mint. Parsley. Sage. Rosemary. Tarragon. Garlic. Oregano. Thyme. Pepper. Balsalmic vinegar. Tahini. Hummus. Tomato sauce. Olives. Mushrooms. Limit these Grains: Prepackaged pasta or rice dishes. Prepackaged cereal with added sugar. Vegetables: Deep fried potatoes (french fries). Fruits: Fruit canned in syrup. Meats and other protein foods: Beef. Pork. Lamb. Poultry with skin. Hot dogs. [place]. Dairy: Ice cream. Sour cream. Whole milk. Beverages: Juice. Sugar-sweetened soft drinks. Beer. Liquor and spirits. Fats and oils: Butter. Canola oil. Vegetable oil. Beef fat (tallow). Lard. Sweets and desserts: Cookies. Cakes. Pies. Candy. Seasoning and other foods: Mayonnaise. Premade sauces and marinades. The items listed may not be a complete list. Talk with your dietitian about what dietary choices are right for you. Summary The Mediterranean diet includes both food and lifestyle choices. Eat a variety of fresh fruits and vegetables, beans, nuts, seeds, and whole grains. Limit the amount of red meat and sweets that you eat. Talk with your health care provider about whether it is safe for you to drink red wine in moderation. This means 1 glass a day for nonpregnant women and 2 glasses a day for men. A glass of wine equals 5 oz (150 mL). This information is not  intended to replace advice given to you by your health care provider. Make sure you discuss any questions you have with your health care provider. Document Released: 01/13/2016 Document Revised: 02/15/2016 Document Reviewed: 01/13/2016 Elsevier Interactive Patient Education  2017 [employer].

## 2024-06-17 ENCOUNTER — Ambulatory Visit (INDEPENDENT_AMBULATORY_CARE_PROVIDER_SITE_OTHER): Admitting: Physical Therapy

## 2024-06-17 ENCOUNTER — Encounter: Payer: Self-pay | Admitting: Physical Therapy

## 2024-06-17 DIAGNOSIS — R262 Difficulty in walking, not elsewhere classified: Secondary | ICD-10-CM | POA: Diagnosis not present

## 2024-06-17 DIAGNOSIS — M25552 Pain in left hip: Secondary | ICD-10-CM | POA: Diagnosis not present

## 2024-06-17 DIAGNOSIS — M6281 Muscle weakness (generalized): Secondary | ICD-10-CM

## 2024-06-17 DIAGNOSIS — M5459 Other low back pain: Secondary | ICD-10-CM

## 2024-06-17 DIAGNOSIS — M79604 Pain in right leg: Secondary | ICD-10-CM | POA: Diagnosis not present

## 2024-06-17 NOTE — Therapy (Signed)
 " OUTPATIENT PHYSICAL THERAPY DISCHARGE    Patient Name: Barbara Patton MRN: [id number] DOB:1940-07-12, 84 y.o., female Today's Date: 06/17/2024   PHYSICAL THERAPY DISCHARGE SUMMARY  Visits from Start of Care: 5  Current functional level related to goals / functional outcomes: See below    Remaining deficits: See below    Education / Equipment: See below    Patient agrees to discharge. Patient goals were not met. Patient is being discharged due to lack of progress.      END OF SESSION:  PT End of Session - 06/17/24 1319     Visit Number 5    Authorization Type [employer]    Progress Note Due on Visit 10    PT Start Time 1302    PT Stop Time 1325    PT Time Calculation (min) 23 min    Activity Tolerance Patient tolerated treatment well    Behavior During Therapy WFL for tasks assessed/performed              Past Medical History:  Diagnosis Date   Glaucoma    High cholesterol    Hypertension    Lupus    Thyroid  disease    Past Surgical History:  Procedure Laterality Date   ABDOMINAL HYSTERECTOMY     CATARACT EXTRACTION, BILATERAL     TOTAL KNEE ARTHROPLASTY Bilateral    TUBAL LIGATION     Patient Active Problem List   Diagnosis Date Noted   Mild cognitive impairment 09/03/2023   Anemia 10/20/2021   Backache 10/20/2021   Bladder outlet obstruction 10/20/2021   Constipation 10/20/2021   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   Menopausal symptom 10/20/2021   Overweight 10/20/2021   History of colonic polyps 10/20/2021   Urinary incontinence 10/20/2021   Globus pharyngeus 05/23/2021   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   Referred otalgia of left ear 08/23/2015   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   Allergic rhinitis 07/26/2015   Anxiety 07/26/2015   Benign essential hypertension 07/26/2015   Dysfunction of left eustachian tube 07/26/2015   Esophageal reflux 07/26/2015   Fibromyalgia 07/26/2015   Pure hypercholesterolemia  07/26/2015   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) 05/03/2014   Pes anserinus bursitis of left knee 12/04/2013   Trochanteric bursitis of left hip 08/07/2013   Leg swelling 12/06/2011   Glaucoma 11/02/2011   HLD (hyperlipidemia) 11/02/2011   HTN (hypertension) 11/02/2011   Hypothyroid 11/02/2011   OA (osteoarthritis) 11/02/2011   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 03/16/2011   Presence of artificial knee joint 03/16/2011    PCP: Rexanne Ingle, MD  REFERRING PROVIDER: Shirly Carlin CROME, PA-C  REFERRING DIAG: [id number] (ICD-10-CM) - Chronic left SI joint pain M25.552 (ICD-10-CM) - Pain in left hip  Rationale for Evaluation and Treatment: Rehabilitation  THERAPY DIAG:  Other low back pain  Muscle weakness (generalized)  Pain in left hip  Pain in right leg  Difficulty in walking, not elsewhere classified  ONSET DATE: chronic   SUBJECTIVE:  SUBJECTIVE STATEMENT:   Knee hurts, neck hurts, everything hurts. Everything I do makes me breathe hard and get tired. I don't understand why. Nothing really much, same old aches and pains. [facility] this [facility] too, try to go 3x/week but not driving anymore so reliant on family to get there.    PERTINENT HISTORY:  L & R TKA, fibromyalgia, HTN, lupus, glaucoma, anxiety  Pt is an 84 y.o. female who presents to the office reporting left buttock pain and leg pain  She c/o pain with forward bending and trying to cross her legs.  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  She had injection about 2 weeks  ago, which she reports helped some.  PAIN:  Are you having pain? Yes: NPRS scale: right now I just feel it, 7-8/10 Pain location: Lt knee and in the back and on the side  Pain description: pulling/tight  Aggravating factors: it just starts  Relieving factors: CBD cream/rubbing   PRECAUTIONS:  None  RED FLAGS: None   WEIGHT BEARING RESTRICTIONS:  No  FALLS:  Has patient fallen in last 6 months? No  LIVING ENVIRONMENT: Lives with: lives alone   OCCUPATION:  Retired - Forensic Scientist  PLOF:  Independent and Leisure: watch TV, water fitness and chair yoga, machines; [facility]  PATIENT GOALS:  Improve pain   OBJECTIVE:  Note: Objective measures were completed at Evaluation unless otherwise noted.  PATIENT SURVEYS:  Patient-Specific Activity Scoring Scheme  0 represents unable to perform. 10 represents able to perform at prior level. 0 1 2 3 4 5 6 7 8 9  10 (Date and Score)   Activity Eval  06/17/24   1. Crossing her legs  0 0   2. Bending forward to put sock on  4  0  Score 2 0   Total score = sum of the activity scores/number of activities Minimum detectable change (90%CI) for average score = 2 points Minimum detectable change (90%CI) for single activity score = 3 points    COGNITIVE STATUS: Within functional limits for tasks assessed   SENSATION: WFL  POSTURE:  rounded shoulders and forward head   GAIT: 05/05/24 Comments: amb mod I with SPC   PALPATION: 05/05/24 tender Lt greater trochanter  LUMBAR ROM:   ROM A/PROM  eval 06/17/24  Flexion WNL WNL   Extension Limited 25% 50% limited   Right quadrant Limited 25%   Left quadrant Limited 50% with pain    (Blank rows = not tested)  06/17/24  B lateral flexion WNL but painful    LOWER EXTREMITY ROM:     ROM Right eval Left eval  Hip flexion    Hip extension    Hip abduction    Hip adduction    Hip internal rotation  15  Hip external rotation  23  Knee flexion     Knee extension    Ankle dorsiflexion    Ankle plantarflexion    Ankle inversion    Ankle eversion     (Blank rows = not tested)   LOWER EXTREMITY MMT:    MMT Right eval Left eval R 06/17/24 L 06/17/24  Hip flexion 4/5 3-/5 4- 3-  Hip extension      Hip abduction      Hip adduction  Hip internal rotation      Hip external rotation      Knee flexion  3/5 3+ 3  Knee extension  4/5 4+ 4  Ankle dorsiflexion      Ankle plantarflexion      Ankle inversion      Ankle eversion       (Blank rows = not tested)     TREATMENT:                                                                                                                              DATE:    06/17/24  PSFS, lumbar ROM, MMT, goals   Education on limited progress with PT, clinical reasoning for DC today- recommended f/u with MD as well as trying water exercise in heated pool rather than cold especially to help manage fibro sx        PATIENT EDUCATION:  Education details: HEP Person educated: Patient Education method: Explanation, Demonstration, and Handouts Education comprehension: verbalized understanding, returned demonstration, and needs further education  HOME EXERCISE PROGRAM: Access Code: [email or web address] URL: [email or web address] Date: 05/05/2024 Prepared by: Corean Ku  Exercises - Standing Single Leg Hip Circles  - 1-2 x daily - 7 x weekly - 1-2 sets - 10 reps - Supine Bridge  - 1-2 x daily - 7 x weekly - 1 sets - 10 reps - 5 sec hold - Hooklying Single Knee to Chest  - 2 x daily - 7 x weekly - 1 sets - 3 reps - 30 sec hold - Supine Piriformis Stretch with Foot on Ground  - 2 x daily - 7 x weekly - 1 sets - 3 reps - 30 sec hold - Seated Flexion Stretch with Swiss Ball  - 1 x daily - 7 x weekly - 3 sets - 10 reps   ASSESSMENT:  CLINICAL IMPRESSION:  Assessed PSFS/objectives/goals today as per POC. Unfortunately she is not making significant progress with skilled PT  services at this time. DC back to MD for further care planning, thank you for the referral!      OBJECTIVE IMPAIRMENTS: decreased mobility, decreased ROM, decreased strength, hypomobility, increased fascial restrictions, impaired flexibility, and pain.   ACTIVITY LIMITATIONS: carrying, lifting, bending, sitting, standing, and locomotion level  PARTICIPATION LIMITATIONS: cleaning, laundry, shopping, and community activity  PERSONAL FACTORS: Age, Behavior pattern, Past/current experiences, Time since onset of injury/illness/exacerbation, and 3+ comorbidities: L & R TKA, fibromyalgia, HTN, lupus, glaucoma, anxiety are also affecting patient's functional outcome.   REHAB POTENTIAL: Good  CLINICAL DECISION MAKING: Evolving/moderate complexity  EVALUATION COMPLEXITY: Moderate   GOALS: Goals reviewed with patient? Yes  SHORT TERM GOALS: Target date: 05/26/2024  Independent with initial HEP Goal status: NOT MET 06/17/24 I do it sometimes but not a lot, I'm trying   LONG TERM GOALS: Target date: 06/16/2024  Independent with final HEP Goal status: NOT MET 06/17/24  2.  PSFS score improved by 2 points Goal status: NOT  MET 06/17/24  3.  Lt hip strength improved to 4/5 for improved function Goal status: NOT MET 06/17/24  4.  Report pain < 3/10 with forward bending for improved function Goal status: NOT MET 06/17/24 still at 3/10  5.  Demonstrate ability to donn socks with pain < 4/10. Goal status: NOT MET 06/17/24    PLAN:  PT FREQUENCY: N/A- DC today   PT DURATION: N/A- DC today   PLANNED INTERVENTIONS: 97164- PT Re-evaluation, 97750- Physical Performance Testing, 97110-Therapeutic exercises, 97530- Therapeutic activity, [id number]- Neuromuscular re-education, 97535- Self Care, [id number]- Manual therapy, [id number]- Electrical stimulation (unattended), 20560 (1-2 muscles), 20561 (3+ muscles)- Dry Needling, Patient/Family education, Balance training, Taping, Joint mobilization, Joint  manipulation, Cryotherapy, and Moist heat.  PLAN FOR NEXT SESSION: DC today    NEXT MD VISIT: PRN  Josette Rough, PT, DPT 06/17/2024 1:29 PM     Referring diagnosis? M53.3,G89.29,  M25.552 (ICD-10-CM)  Treatment diagnosis? (if different than referring diagnosis) M62.81, M54.59, M25.552 What was this (referring dx) caused by? []  Surgery []  Fall [x]  Ongoing issue [x]  Arthritis []  Other: ____________  Laterality: []  Rt [x]  Lt []  Both  Check all possible CPT codes:  *CHOOSE 10 OR LESS*    See Planned Interventions listed in the Plan section of the Evaluation.    "

## 2024-09-12 ENCOUNTER — Ambulatory Visit: Admitting: Neurology

## 2024-12-15 ENCOUNTER — Ambulatory Visit: Payer: Self-pay | Admitting: Physician Assistant

## 2024-12-31 ENCOUNTER — Institutional Professional Consult (permissible substitution): Admitting: Psychology

## 2024-12-31 ENCOUNTER — Ambulatory Visit: Payer: Self-pay

## 2025-01-07 ENCOUNTER — Encounter: Admitting: Psychology

## 2025-01-19 ENCOUNTER — Ambulatory Visit: Admitting: Physician Assistant
